# Patient Record
Sex: Female | Born: 1970 | Race: Black or African American | Hispanic: No | Marital: Single | Smoking: Current every day smoker
Health system: Southern US, Community
[De-identification: ages and names within clinical notes are randomized; demographics above are authoritative.]

## PROBLEM LIST (undated history)

## (undated) DIAGNOSIS — I701 Atherosclerosis of renal artery: Secondary | ICD-10-CM

## (undated) DIAGNOSIS — Z8042 Family history of malignant neoplasm of prostate: Secondary | ICD-10-CM

## (undated) DIAGNOSIS — C50919 Malignant neoplasm of unspecified site of unspecified female breast: Secondary | ICD-10-CM

## (undated) DIAGNOSIS — I10 Essential (primary) hypertension: Secondary | ICD-10-CM

## (undated) DIAGNOSIS — C8591 Non-Hodgkin lymphoma, unspecified, lymph nodes of head, face, and neck: Secondary | ICD-10-CM

## (undated) DIAGNOSIS — K219 Gastro-esophageal reflux disease without esophagitis: Secondary | ICD-10-CM

## (undated) DIAGNOSIS — I517 Cardiomegaly: Secondary | ICD-10-CM

## (undated) DIAGNOSIS — I2510 Atherosclerotic heart disease of native coronary artery without angina pectoris: Secondary | ICD-10-CM

## (undated) DIAGNOSIS — G510 Bell's palsy: Secondary | ICD-10-CM

## (undated) DIAGNOSIS — Z803 Family history of malignant neoplasm of breast: Secondary | ICD-10-CM

## (undated) DIAGNOSIS — I219 Acute myocardial infarction, unspecified: Secondary | ICD-10-CM

## (undated) DIAGNOSIS — Z1379 Encounter for other screening for genetic and chromosomal anomalies: Secondary | ICD-10-CM

## (undated) HISTORY — DX: Family history of malignant neoplasm of breast: Z80.3

## (undated) HISTORY — PX: BREAST SURGERY: SHX581

## (undated) HISTORY — PX: TUBAL LIGATION: SHX77

## (undated) HISTORY — DX: Bell's palsy: G51.0

## (undated) HISTORY — DX: Family history of malignant neoplasm of prostate: Z80.42

## (undated) NOTE — *Deleted (*Deleted)
Patient Care Team: Gilda Crease, MD as PCP - General (Internal Medicine) Lennette Bihari, MD as PCP - Cardiology (Cardiology) Harriette Bouillon, MD as Consulting Physician (General Surgery) Serena Croissant, MD as Consulting Physician (Hematology and Oncology) Dorothy Puffer, MD as Consulting Physician (Radiation Oncology) Axel Filler Larna Daughters, NP as Nurse Practitioner (Hematology and Oncology)  DIAGNOSIS: No diagnosis found.  SUMMARY OF ONCOLOGIC HISTORY: Oncology History  Malignant neoplasm of overlapping sites of right breast in female, estrogen receptor positive (HCC)  07/18/2016 Initial Diagnosis   Palpable right breast masses for 1 year; 2 adjacent spiculated masses by ultrasound at 1:00 subareolar 4.2 cm mass; 3 satellite nodules at 9:00; 2 abnormal lymph nodes; biopsy of the 2 masses and the lymph node showed similar-appearing grade 3 IDC ER 100% PR 95% Ki-67 20-30%, HER-2 negative ratio 1.57; T2 N1 stage IIB (AJCC 8)   08/09/2016 - 10/11/2016 Neo-Adjuvant Chemotherapy   Taxotere and Cytoxan every 3 weeks 4 cycles   08/22/2016 Genetic Testing   BRCA1 c.1802A>G VUS identified on the common hereditary cancer panel.  The Hereditary Gene Panel offered by [facility] includes sequencing and/or deletion duplication testing of the following 46 genes: APC, ATM, AXIN2, BARD1, BMPR1A, BRCA1, BRCA2, BRIP1, CDH1, CDKN2A (p14ARF), CDKN2A (p16INK4a), CHEK2, CTNNA1, DICER1, EPCAM (Deletion/duplication testing only), GREM1 (promoter region deletion/duplication testing only), KIT, MEN1, MLH1, MSH2, MSH3, MSH6, MUTYH, NBN, NF1, NHTL1, PALB2, PDGFRA, PMS2, POLD1, POLE, PTEN, RAD50, RAD51C, RAD51D, SDHB, SDHC, SDHD, SMAD4, SMARCA4. STK11, TP53, TSC1, TSC2, and VHL.  The following genes were evaluated for sequence changes only: SDHA and HOXB13 c.251G>A variant only.  The report date is Aug 22, 2016.    01/30/2017 Surgery   Right mastectomy: IDC grade 3, 7.2 cm, high-grade DCIS, lymphovascular invasion  present including dermal lymphatics, perineural invasion present, margins negative, 4/5 lymph nodes positive with extracapsular extension, ER 100%, PR 100%, HER-2 negative ratio 1.41   05/29/2017 - 07/18/2017 Radiation Therapy   Adjuvant radiation therapy   04/13/2018 -  Anti-estrogen oral therapy   Initially prescribed tamoxifen but apparently she did not pick it up.  Because she is menopausal we are starting anastrozole 1 mg daily starting 04/13/2018     CHIEF COMPLIANT: Follow-up of breast cancer on anastrozole   INTERVAL HISTORY: [name] is a 73 y.o. with above-mentioned history of breast cancer who underwent neoadjuvant chemotherapy, mastectomy, radiation, and is currently on antiestrogen therapy with anastrozole. Mammogram on 05/12/18 showed no evidence of malignancy in the left breast. She presents to the clinic today for follow-up.   ALLERGIES:  is allergic to compazine [prochlorperazine] and ondansetron hcl.  MEDICATIONS:  Current Outpatient Medications  Medication Sig Dispense Refill  . acetaminophen (TYLENOL) 500 MG tablet Take 1,000 mg by mouth every 8 (eight) hours as needed for mild pain or headache.    . albuterol (PROAIR HFA) 108 (90 Base) MCG/ACT inhaler Inhale 2 puffs into the lungs every 6 (six) hours as needed for wheezing or shortness of breath.    Marland Kitchen albuterol (PROVENTIL) (2.5 MG/3ML) 0.083% nebulizer solution Take 3 mLs (2.5 mg total) by nebulization every 6 (six) hours as needed for wheezing or shortness of breath. 75 mL 12  . anastrozole (ARIMIDEX) 1 MG tablet TAKE 1 TABLET BY MOUTH EVERY DAY 90 tablet 0  . aspirin 81 MG chewable tablet Chew 81 mg by mouth daily.    Marland Kitchen atorvastatin (LIPITOR) 80 MG tablet Take 1 tablet (80 mg total) by mouth daily at 6 PM. 30 tablet 0  .  cetirizine (ZYRTEC) 10 MG tablet Take 10 mg by mouth daily as needed for allergies.   11  . [facility] D3 5000 units capsule Take 5,000 Units by mouth daily.  11  . furosemide (LASIX) 40 MG tablet Take  1 tablet (40 mg total) by mouth daily. 90 tablet 3  . metoprolol succinate (TOPROL XL) 100 MG 24 hr tablet Take 100 mg by mouth daily.    . mometasone-formoterol (DULERA) 200-5 MCG/ACT AERO Inhale 2 puffs into the lungs 2 (two) times daily. 13 g 0  . nitroGLYCERIN (NITROSTAT) 0.4 MG SL tablet Place 0.4 mg under the tongue every 5 (five) minutes as needed for chest pain.    . sacubitril-valsartan (ENTRESTO) 49-51 MG Take 1 tablet by mouth 2 (two) times daily. 60 tablet 1  . spironolactone (ALDACTONE) 25 MG tablet Take 1 tablet (25 mg total) by mouth daily. 90 tablet 3  . venlafaxine XR (EFFEXOR-XR) 75 MG 24 hr capsule TAKE 1 CAPSULE (75 MG TOTAL) BY MOUTH DAILY WITH BREAKFAST. 30 capsule 11   No current facility-administered medications for this visit.    PHYSICAL EXAMINATION: ECOG PERFORMANCE STATUS: {CHL ONC ECOG [phone]}  There were no vitals filed for this visit. There were no vitals filed for this visit.  BREAST:*** No palpable masses or nodules in either right or left breasts. No palpable axillary supraclavicular or infraclavicular adenopathy no breast tenderness or nipple discharge. (exam performed in the presence of a chaperone)  LABORATORY DATA:  I have reviewed the data as listed CMP Latest Ref Rng & Units 07/02/2019 04/16/2019 04/15/2019  Glucose 65 - 99 mg/dL 91 95 -  BUN 6 - 24 mg/dL 7 17 -  Creatinine 5.18 - 1.00 mg/dL 8.41 6.60 -  Sodium 630 - 144 mmol/L 141 137 140  Potassium 3.5 - 5.2 mmol/L 4.1 3.6 3.3(L)  Chloride 96 - 106 mmol/L 106 102 -  CO2 20 - 29 mmol/L 21 23 -  Calcium 8.7 - 10.2 mg/dL 10.7(H) 9.8 -  Total Protein 6.0 - 8.5 g/dL 6.6 - -  Total Bilirubin 0.0 - 1.2 mg/dL 0.2 - -  Alkaline Phos 39 - 117 IU/L 212(H) - -  AST 0 - 40 IU/L 18 - -  ALT 0 - 32 IU/L 13 - -    Lab Results  Component Value Date   WBC 8.8 07/02/2019   HGB 13.9 07/02/2019   HCT 40.6 07/02/2019   MCV 90 07/02/2019   PLT 230 07/02/2019   NEUTROABS 23.9 (H) 04/11/2019     ASSESSMENT & PLAN:  No problem-specific Assessment & Plan notes found for this encounter.    No orders of the defined types were placed in this encounter.  The patient has a good understanding of the overall plan. she agrees with it. she will call with any problems that may develop before the next visit here.  Total time spent: *** mins including face to face time and time spent for planning, charting and coordination of care  Serena Croissant, MD 01/12/2020  I, Kirt Boys Dorshimer, am acting as scribe for Dr. Serena Croissant.  {insert scribe attestation}

---

## 1998-03-25 HISTORY — PX: SKIN BIOPSY: SHX1

## 2008-12-06 ENCOUNTER — Observation Stay (HOSPITAL_COMMUNITY): Admission: EM | Admit: 2008-12-06 | Discharge: 2008-12-06 | Payer: Self-pay | Admitting: Emergency Medicine

## 2009-02-15 ENCOUNTER — Observation Stay (HOSPITAL_COMMUNITY): Admission: EM | Admit: 2009-02-15 | Discharge: 2009-02-15 | Payer: Self-pay | Admitting: Unknown Physician Specialty

## 2009-03-07 ENCOUNTER — Emergency Department (HOSPITAL_COMMUNITY): Admission: EM | Admit: 2009-03-07 | Discharge: 2009-03-07 | Payer: Self-pay | Admitting: Emergency Medicine

## 2009-05-02 ENCOUNTER — Observation Stay (HOSPITAL_COMMUNITY): Admission: EM | Admit: 2009-05-02 | Discharge: 2009-05-02 | Payer: Self-pay | Admitting: Emergency Medicine

## 2009-05-31 ENCOUNTER — Emergency Department (HOSPITAL_COMMUNITY): Admission: EM | Admit: 2009-05-31 | Discharge: 2009-05-31 | Payer: Self-pay | Admitting: Emergency Medicine

## 2010-06-27 LAB — CBC
MCHC: 33.8 g/dL (ref 30.0–36.0)
MCV: 90.8 fL (ref 78.0–100.0)
RBC: 4.66 MIL/uL (ref 3.87–5.11)
RDW: 14.9 % (ref 11.5–15.5)
WBC: 11.5 10*3/uL — ABNORMAL HIGH (ref 4.0–10.5)

## 2010-06-27 LAB — BASIC METABOLIC PANEL
CO2: 22 mEq/L (ref 19–32)
GFR calc Af Amer: >60 mL/min (ref >60)
GFR calc non Af Amer: >60 mL/min (ref >60)
Glucose, Bld: 132 mg/dL — ABNORMAL HIGH (ref 70–99)
Sodium: 139 mEq/L (ref 135–145)

## 2010-06-27 LAB — DIFFERENTIAL
Basophils Absolute: 0 10*3/uL (ref 0.0–0.1)
Lymphocytes Relative: 11 % — ABNORMAL LOW (ref 12–46)
Monocytes Absolute: 0.2 10*3/uL (ref 0.1–1.0)
Monocytes Relative: 2 % — ABNORMAL LOW (ref 3–12)
Neutro Abs: 9.9 10*3/uL — ABNORMAL HIGH (ref 1.7–7.7)

## 2010-06-29 LAB — URINE MICROSCOPIC-ADD ON

## 2010-06-29 LAB — COMPREHENSIVE METABOLIC PANEL
ALT: 19 U/L (ref 0–35)
BUN: 3 mg/dL — ABNORMAL LOW (ref 6–23)
Chloride: 104 mEq/L (ref 96–112)
Potassium: 3.1 mEq/L — ABNORMAL LOW (ref 3.5–5.1)
Sodium: 138 mEq/L (ref 135–145)
Total Bilirubin: 0.3 mg/dL (ref 0.3–1.2)
Total Protein: 7 g/dL (ref 6.0–8.3)

## 2010-06-29 LAB — CBC
HCT: 43.4 % (ref 36.0–46.0)
Hemoglobin: 14.6 g/dL (ref 12.0–15.0)
MCHC: 33.7 g/dL (ref 30.0–36.0)
Platelets: 215 10*3/uL (ref 150–400)
RBC: 4.85 MIL/uL (ref 3.87–5.11)
RDW: 14.3 % (ref 11.5–15.5)
WBC: 11.4 10*3/uL — ABNORMAL HIGH (ref 4.0–10.5)

## 2010-06-29 LAB — URINALYSIS, ROUTINE W REFLEX MICROSCOPIC
Glucose, UA: NEGATIVE mg/dL
Hgb urine dipstick: NEGATIVE
Ketones, ur: NEGATIVE mg/dL
Specific Gravity, Urine: 1.018 (ref 1.005–1.030)

## 2010-06-29 LAB — DIFFERENTIAL
Basophils Absolute: 0.1 10*3/uL (ref 0.0–0.1)
Basophils Relative: 1 % (ref 0–1)
Lymphocytes Relative: 17 % (ref 12–46)
Lymphs Abs: 1.9 10*3/uL (ref 0.7–4.0)
Monocytes Relative: 5 % (ref 3–12)
Neutro Abs: 8.6 10*3/uL — ABNORMAL HIGH (ref 1.7–7.7)
Neutrophils Relative %: 76 % (ref 43–77)

## 2011-06-21 ENCOUNTER — Encounter (HOSPITAL_COMMUNITY): Payer: Self-pay | Admitting: *Deleted

## 2011-06-21 ENCOUNTER — Emergency Department (HOSPITAL_COMMUNITY)
Admission: EM | Admit: 2011-06-21 | Discharge: 2011-06-21 | Disposition: A | Discharge status: Home / Self Care | Payer: Medicaid Other | Attending: Emergency Medicine | Admitting: Emergency Medicine

## 2011-06-21 ENCOUNTER — Emergency Department (HOSPITAL_COMMUNITY): Payer: Medicaid Other

## 2011-06-21 DIAGNOSIS — J45901 Unspecified asthma with (acute) exacerbation: Secondary | ICD-10-CM

## 2011-06-21 DIAGNOSIS — R0602 Shortness of breath: Secondary | ICD-10-CM | POA: Insufficient documentation

## 2011-06-21 DIAGNOSIS — I10 Essential (primary) hypertension: Secondary | ICD-10-CM | POA: Insufficient documentation

## 2011-06-21 HISTORY — DX: Essential (primary) hypertension: I10

## 2011-06-21 LAB — CBC
MCHC: 33.3 g/dL (ref 30.0–36.0)
MCV: 89.1 fL (ref 78.0–100.0)
Platelets: 232 10*3/uL (ref 150–400)
RDW: 14.1 % (ref 11.5–15.5)
WBC: 9.9 10*3/uL (ref 4.0–10.5)

## 2011-06-21 LAB — DIFFERENTIAL
Basophils Absolute: 0.1 10*3/uL (ref 0.0–0.1)
Basophils Relative: 1 % (ref 0–1)
Eosinophils Absolute: 0.4 10*3/uL (ref 0.0–0.7)
Eosinophils Relative: 4 % (ref 0–5)
Lymphocytes Relative: 48 % — ABNORMAL HIGH (ref 12–46)

## 2011-06-21 LAB — BASIC METABOLIC PANEL
CO2: 23 mEq/L (ref 19–32)
Calcium: 9.7 mg/dL (ref 8.4–10.5)
Creatinine, Ser: 0.65 mg/dL (ref 0.50–1.10)
GFR calc Af Amer: >90 mL/min (ref >90)
GFR calc non Af Amer: >90 mL/min (ref >90)
Sodium: 139 mEq/L (ref 135–145)

## 2011-06-21 MED ORDER — METHYLPREDNISOLONE SODIUM SUCC 125 MG IJ SOLR
125.0000 mg | Freq: Once | INTRAMUSCULAR | Status: AC
  Administered 2011-06-21: 125 mg via INTRAVENOUS
  Filled 2011-06-21: qty 2

## 2011-06-21 MED ORDER — ALBUTEROL (5 MG/ML) CONTINUOUS INHALATION SOLN
10.0000 mg/h | INHALATION_SOLUTION | RESPIRATORY_TRACT | Status: DC
  Administered 2011-06-21: 10 mg/h via RESPIRATORY_TRACT

## 2011-06-21 MED ORDER — ALBUTEROL SULFATE (5 MG/ML) 0.5% IN NEBU
INHALATION_SOLUTION | RESPIRATORY_TRACT | Status: AC
  Administered 2011-06-21: 5 mg via RESPIRATORY_TRACT
  Filled 2011-06-21: qty 1

## 2011-06-21 MED ORDER — ALBUTEROL SULFATE (5 MG/ML) 0.5% IN NEBU: 2.5000 mg | INHALATION_SOLUTION | Freq: Once | RESPIRATORY_TRACT | Status: DC

## 2011-06-21 MED ORDER — ALBUTEROL SULFATE (5 MG/ML) 0.5% IN NEBU
5.0000 mg | INHALATION_SOLUTION | Freq: Once | RESPIRATORY_TRACT | Status: AC
  Administered 2011-06-21: 5 mg via RESPIRATORY_TRACT

## 2011-06-21 MED ORDER — IPRATROPIUM BROMIDE 0.02 % IN SOLN
RESPIRATORY_TRACT | Status: AC
  Administered 2011-06-21: 0.5 mg via RESPIRATORY_TRACT
  Filled 2011-06-21: qty 2.5

## 2011-06-21 MED ORDER — PREDNISONE 10 MG PO TABS: 20.0000 mg | ORAL_TABLET | Freq: Every day | ORAL | Status: DC

## 2011-06-21 MED ORDER — IPRATROPIUM BROMIDE 0.02 % IN SOLN
0.5000 mg | Freq: Once | RESPIRATORY_TRACT | Status: AC
  Administered 2011-06-21: 0.5 mg via RESPIRATORY_TRACT

## 2011-06-21 NOTE — ED Provider Notes (Signed)
Medical screening examination/treatment/procedure(s) were performed by non-physician practitioner and as supervising physician I was immediately available for consultation/collaboration.  Arshiya Jakes M Alyzae Hawkey, MD 06/21/11 2252 

## 2011-06-21 NOTE — ED Notes (Signed)
Pt d/c home in NAD. Pt no longer SOB and states "I feel much better." Pt voiced understanding of d/c instructions, prescriptions and follow up care.

## 2011-06-21 NOTE — Progress Notes (Signed)
Patient is no longer wheezing and WOB has improved. Did peak flow and FEV1.  FEV 1 was 1.5, Peak flow was 180.  This is 50% of predicted for her age and height.  Spoke with MD.

## 2011-06-21 NOTE — Progress Notes (Signed)
Patient is unable to do peak flow at this time.  She can not speak in complete sentences.  Asked MD what he thought about doing a continuous nebulizer and then trying to assess peak flows.  MD approved.

## 2011-06-21 NOTE — ED Provider Notes (Signed)
History     CSN: [id number]  Arrival date & time 06/21/11  1225   None     Chief Complaint  Patient presents with  . Shortness of Breath    (Consider location/radiation/quality/duration/timing/severity/associated sxs/prior treatment) Patient is a 41 y.o. female presenting with shortness of breath. The history is provided by the patient. No language interpreter was used.  Shortness of Breath  The current episode started yesterday. The problem occurs frequently. The problem has been gradually worsening. The problem is moderate. Associated symptoms include cough, shortness of breath and wheezing. Pertinent negatives include no fever. She was not exposed to toxic fumes. She has not inhaled smoke recently. She has had no prior hospitalizations. She has had no prior ICU admissions. She has had no prior intubations. Her past medical history is significant for asthma.  Patient states this episode of wheezing started yesterday.  Initially improved with albuterol MDI, but symptoms continue to worsen.  Denies fever, cough.    Past Medical History  Diagnosis Date  . Asthma   . Hypertension     Past Surgical History  Procedure Date  . Cesarean section     No family history on file.  History  Substance Use Topics  . Smoking status: Current Everyday Smoker  . Smokeless tobacco: Not on file  . Alcohol Use: No    OB History    Grav Para Term Preterm Abortions TAB SAB Ect Mult Living                  Review of Systems  Constitutional: Negative for fever.  Respiratory: Positive for cough, shortness of breath and wheezing.   All other systems reviewed and are negative.    Allergies  Review of patient's allergies indicates no known allergies.  Home Medications   Current Outpatient Rx  Name Route Sig Dispense Refill  . ALBUTEROL SULFATE HFA 108 (90 BASE) MCG/ACT IN AERS Inhalation Inhale 2 puffs into the lungs every 4 (four) hours as needed. As needed for shortness of  breath/asthma.    Marland Kitchen LISINOPRIL-HYDROCHLOROTHIAZIDE 20-12.5 MG PO TABS Oral Take 1 tablet by mouth daily.    Marland Kitchen MONTELUKAST SODIUM 10 MG PO TABS Oral Take 10 mg by mouth at bedtime.      BP 156/107  Pulse 94  Temp 98.1 F (36.7 C)  Resp 28  Ht 5\' 3"  (1.6 m)  Wt 182 lb (82.555 kg)  BMI 32.24 kg/m2  SpO2 100%  Physical Exam  Nursing note and vitals reviewed. Constitutional: She is oriented to person, place, and time. She appears well-developed and well-nourished.  HENT:  Head: Normocephalic.  Eyes: Pupils are equal, round, and reactive to light.  Neck: Normal range of motion. Neck supple.  Cardiovascular: Normal rate, regular rhythm, normal heart sounds and intact distal pulses.   Pulmonary/Chest: She is in respiratory distress. She has wheezes. She has no rales. She exhibits tenderness.  Abdominal: Soft.  Musculoskeletal: Normal range of motion.  Neurological: She is alert and oriented to person, place, and time.  Skin: Skin is warm and dry.  Psychiatric: She has a normal mood and affect. Her behavior is normal. Judgment and thought content normal.    ED Course  Procedures (including critical care time)  Labs Reviewed - No data to display No results found.   No diagnosis found.  1:01 PM  Asthma exacerbation.  Will initiate nebulized albuterol/atrovent, and obtain chest film to r/o pneumonia.  Symptoms improved with treatment.  Peak flows assessed by RT.  Will continue with short course of prednisone at home, with continuation of established treatment plan.  Patient to follow-up with her PCP.  MDM          Jimmye Norman, NP 06/21/11 2248

## 2011-06-21 NOTE — ED Notes (Signed)
Pt receiving a neb treatment at this time.

## 2011-06-21 NOTE — Discharge Instructions (Signed)

## 2011-06-21 NOTE — ED Notes (Signed)
Patient with hx of asthma,  She noted increased sob last night.  Patient has tried her home tx w/o relief. Her last inhaler treatment was pta

## 2014-06-20 ENCOUNTER — Ambulatory Visit (HOSPITAL_BASED_OUTPATIENT_CLINIC_OR_DEPARTMENT_OTHER): Payer: Medicaid Other

## 2014-08-16 ENCOUNTER — Ambulatory Visit (INDEPENDENT_AMBULATORY_CARE_PROVIDER_SITE_OTHER): Payer: Medicaid Other | Admitting: Neurology

## 2014-08-16 ENCOUNTER — Encounter: Payer: Self-pay | Admitting: Neurology

## 2014-08-16 VITALS — BP 117/77 | HR 67 | Temp 98.0°F | Ht 63.0 in | Wt 156.5 lb

## 2014-08-16 DIAGNOSIS — R0681 Apnea, not elsewhere classified: Secondary | ICD-10-CM

## 2014-08-16 DIAGNOSIS — R51 Headache: Secondary | ICD-10-CM | POA: Diagnosis not present

## 2014-08-16 DIAGNOSIS — H905 Unspecified sensorineural hearing loss: Secondary | ICD-10-CM

## 2014-08-16 DIAGNOSIS — R519 Headache, unspecified: Secondary | ICD-10-CM | POA: Insufficient documentation

## 2014-08-16 DIAGNOSIS — H538 Other visual disturbances: Secondary | ICD-10-CM | POA: Diagnosis not present

## 2014-08-16 DIAGNOSIS — H9190 Unspecified hearing loss, unspecified ear: Secondary | ICD-10-CM

## 2014-08-16 DIAGNOSIS — R0683 Snoring: Secondary | ICD-10-CM

## 2014-08-16 DIAGNOSIS — G4719 Other hypersomnia: Secondary | ICD-10-CM | POA: Diagnosis not present

## 2014-08-16 MED ORDER — CYCLOBENZAPRINE HCL 5 MG PO TABS: 5.0000 mg | ORAL_TABLET | Freq: Three times a day (TID) | ORAL | Status: DC | PRN

## 2014-08-16 MED ORDER — NORTRIPTYLINE HCL 10 MG PO CAPS: 10.0000 mg | ORAL_CAPSULE | Freq: Every day | ORAL | Status: DC

## 2014-08-16 NOTE — Patient Instructions (Addendum)
Overall you are doing fairly well but I do want to suggest a few things today:   Remember to drink plenty of fluid, eat healthy meals and do not skip any meals. Try to eat protein with a every meal and eat a healthy snack such as fruit or nuts in between meals. Try to keep a regular sleep-wake schedule and try to exercise daily, particularly in the form of walking, 20-30 minutes a day, if you can.   As far as your medications are concerned, I would like to suggest: Nortriptyline at night, sleep study  As far as diagnostic testing: MRi of the brain, lab test  I would like to see you back in 6 months, sooner if we need to. Please call us with any interim questions, concerns, problems, updates or refill requests.   Please also call us for any test results so we can go over those with you on the phone.  My clinical assistant and will answer any of your questions and relay your messages to me and also relay most of my messages to you.   Our phone number is [phone]. We also have an after hours call service for urgent matters and there is a physician on-call for urgent questions. For any emergencies you know to call 911 or go to the nearest emergency room

## 2014-08-16 NOTE — Progress Notes (Signed)
[facility]    Provider:  Dr Jaynee Eagles Referring Provider: Rickey Barbara* Primary Care Physician:  Pcp Not In System  CC:  Headache  HPI:  [name] is a 44 y.o. female here as a referral from Dr. 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 Has had the symptoms for years, has residual right-sided weakness. No worsening of her right-sided Bell's palsy symptoms, they are stable. She is having pain on the left side of the face now.  After she sleeps, she can't open up her left eye, she has to use her fingers to pry her left eye open. No crusting or exudate on the eyelids or any injection or itching. During the day she can close it and open her left eye just fine. Symptoms started a month ago. She has twitches 2-3 times a day in the left eye but twitching is not the problem keeping it closed. No weakness or numbness on the left side of the face. She does endorse pain on the left side of her face, she has to take a warm cloth and place it on the left side of her face. The pain gets so bad that she jumps up. She has been taking over-the-counter medications every day, 6 times a day. No light sensitivity or sound sensitivity. No nausea. She has blurry vision. She hears swishing in her ears. She is not wearing her glasses today but recently did get some. She describes the left-sided as a dull headache, worse at night when she is sleeping. The headache wakes her up. She snores. Her boyfriend says she stops breathing at night. She was supposed to have a sleep test but her car broke down and she never showed up for her testing. She has headaches 3 times a day for several hours each.   Review of Systems: Patient complains of symptoms per HPI as well as the following symptoms: Fatigue, chest pain, swelling and legs, blurred vision, shortness of breath, cough, wheezing, feeling cold, feeling hot, increased thirst,  confusion, weakness, allergies, too much sleep. Pertinent negatives per HPI. All others negative.   History   Social History  . Marital Status: Single    Spouse Name: N/A  . Number of Children: 4  . Years of Education: 9   Occupational History  . Unemployed    Social History Main Topics  . Smoking status: Current Every Day Smoker  . Smokeless tobacco: Not on file     Comment: 6 cig per day  . Alcohol Use: No  . Drug Use: No  . Sexual Activity: Not on file   Other Topics Concern  . Not on file   Social History Narrative   Lives at home with fiance and kids   Caffeine use: 2 cups coffee per day   4 Dr. Malachi Bonds (12oz) per day     History reviewed. No pertinent family history.  Past Medical History  Diagnosis Date  . Asthma   . Hypertension     Past Surgical History  Procedure Laterality Date  . Cesarean section  [place]    x2  . Skin biopsy  2000    Current Outpatient Prescriptions  Medication Sig Dispense Refill  . albuterol (PROVENTIL HFA;VENTOLIN HFA) 108 (90 BASE) MCG/ACT inhaler Inhale 2 puffs into the lungs every 4 (four) hours as needed. As needed for shortness of breath/asthma.    Marland Kitchen amLODipine (NORVASC) 10 MG tablet Take 10 mg by  mouth daily.    Marland Kitchen aspirin 81 MG chewable tablet Chew 81 mg by mouth daily.    . cloNIDine (CATAPRES) 0.2 MG tablet Take 0.2 mg by mouth 2 (two) times daily.    . hydrochlorothiazide (HYDRODIURIL) 25 MG tablet Take 25 mg by mouth daily.    Marland Kitchen ibuprofen (ADVIL,MOTRIN) 800 MG tablet Take 800 mg by mouth as needed for mild pain or moderate pain.   1  . lisinopril (PRINIVIL,ZESTRIL) 40 MG tablet Take 40 mg by mouth daily.    . metoprolol succinate (TOPROL-XL) 50 MG 24 hr tablet Take 50 mg by mouth daily.  3  . mometasone-formoterol (DULERA) 200-5 MCG/ACT AERO Inhale 1 puff into the lungs 2 (two) times daily.    . montelukast (SINGULAIR) 10 MG tablet Take 10 mg by mouth at bedtime.    . nitroGLYCERIN (NITROSTAT) 0.4 MG SL tablet  Place 0.4 mg under the tongue every 5 (five) minutes as needed for chest pain.    Marland Kitchen omeprazole (PRILOSEC) 40 MG capsule Take 40 mg by mouth daily.    . cyclobenzaprine (FLEXERIL) 5 MG tablet Take 1 tablet (5 mg total) by mouth every 8 (eight) hours as needed for muscle spasms. 60 tablet 6  . nortriptyline (PAMELOR) 10 MG capsule Take 1 capsule (10 mg total) by mouth at bedtime. 30 capsule 6   No current facility-administered medications for this visit.    Allergies as of 08/16/2014  . (No Known Allergies)    Vitals: BP 117/77 mmHg  Pulse 67  Temp(Src) 98 F (36.7 C)  Ht 5\' 3"  (1.6 m)  Wt 156 lb 8 oz (70.988 kg)  BMI 27.73 kg/m2 Last Weight:  Wt Readings from Last 1 Encounters:  08/16/14 156 lb 8 oz (70.988 kg)   Last Height:   Ht Readings from Last 1 Encounters:  08/16/14 5\' 3"  (1.6 m)    Physical exam: Exam: Gen: NAD, conversant, well nourised, overweight, well groomed                     CV: RRR, no MRG. No Carotid Bruits. No peripheral edema, warm, nontender Eyes: Conjunctivae clear without exudates or hemorrhage  Neuro: Detailed Neurologic Exam  Speech:    Speech is normal; fluent and spontaneous with normal comprehension.  Cognition:    The patient is oriented to person, place, and time;     recent and remote memory intact;     language fluent;     normal attention, concentration,     fund of knowledge Cranial Nerves:    The pupils are equal, round, and reactive to light. The fundi are normal and spontaneous venous pulsations are present. Visual fields are full to finger confrontation. Extraocular movements are intact. Trigeminal sensation is intact and the muscles of mastication are normal. Right lower  facial droop and retracted right lid c/w bells palsy. The palate elevates in the midline. Hearing intact. Voice is normal. Shoulder shrug is normal. The tongue has normal motion without fasciculations.   Coordination:    Normal finger to nose and heel to shin.  Normal rapid alternating movements.   Gait:    Heel-toe and tandem gait are normal.   Motor Observation:    No asymmetry, no atrophy, and no involuntary movements noted. Tone:    Normal muscle tone.    Posture:    Posture is normal. normal erect    Strength:    Strength is V/V in the upper and lower limbs.  Sensation: intact to LT     Reflex Exam:  DTR's:    Deep tendon reflexes in the upper and lower extremities are normal bilaterally.   Toes:    The toes are downgoing bilaterally.   Clonus:    Clonus is absent.     Assessment/Plan:  44 year old female with past medical history of hypertension, wright-sided Bell's palsy now presenting with left sided headache. Will order MRI of the brain due to blurry vision and hearing changes. Neuro exam is nonfocal.   Headaches may also be partly due to obstructive sleep apnea as her boyfriend states that she snores and has witnessed apneic events at night, with headaches that wake her up from sleep.    Will order sleep study: She has a headache, worse at night when she is sleeping. The pain wakes her up. She snores. Her boyfriend says she stops breathing at night. She was supposed to have a sleep test but her car broke down.   FSS 42, ESS 9  Will start nortriptyline at night for headache. Personally reviewed EKG and QTC normal  Flexeril when necessary for headache  CMP    Sarina Ill, MD  [facility] [address] [place], [place] [address]  Phone [phone] Fax [phone]

## 2014-08-17 ENCOUNTER — Telehealth: Payer: Self-pay

## 2014-08-17 LAB — COMPREHENSIVE METABOLIC PANEL WITH GFR
ALT: 9 [IU]/L (ref 0–32)
AST: 12 [IU]/L (ref 0–40)
Albumin/Globulin Ratio: 1.8 (ref 1.1–2.5)
Albumin: 4.8 g/dL (ref 3.5–5.5)
Alkaline Phosphatase: 112 [IU]/L (ref 39–117)
BUN/Creatinine Ratio: 10 (ref 9–23)
BUN: 8 mg/dL (ref 6–24)
Bilirubin Total: 0.2 mg/dL (ref 0.0–1.2)
CO2: 23 mmol/L (ref 18–29)
Calcium: 10 mg/dL (ref 8.7–10.2)
Chloride: 101 mmol/L (ref 97–108)
Creatinine, Ser: 0.83 mg/dL (ref 0.57–1.00)
GFR calc Af Amer: 101 mL/min/{1.73_m2}
GFR calc non Af Amer: 87 mL/min/{1.73_m2}
Globulin, Total: 2.6 g/dL (ref 1.5–4.5)
Glucose: 96 mg/dL (ref 65–99)
Potassium: 3.4 mmol/L — ABNORMAL LOW (ref 3.5–5.2)
Sodium: 145 mmol/L — ABNORMAL HIGH (ref 134–144)
Total Protein: 7.4 g/dL (ref 6.0–8.5)

## 2014-08-17 NOTE — Telephone Encounter (Signed)
Patient informed her labs were normal

## 2014-08-21 ENCOUNTER — Encounter: Payer: Self-pay | Admitting: Neurology

## 2014-08-22 DIAGNOSIS — I150 Renovascular hypertension: Secondary | ICD-10-CM

## 2014-08-22 NOTE — H&P (Signed)
OFFICE VISIT NOTES COPIED TO EPIC FOR DOCUMENTATION   [name] 08/23/2014 8:43 AM Location: [facility] Patient #: [phone] DOB: August 24, 1970 Single / Language: Cleophus Molt / Race: Black or African American Female    History of Present Illness(Chandra Joannie Springs, MD; 08-23-2014 3:27 PM) The patient is a 44 year old female who presents for a follow-up for Hypertension. Ms. Arrey is 44 years old African-American female. She has history of hypertension since 2004. Patient had uncontrolled hypertension, She still has mildly high systolic pressure, but, overall, much better controlled.  She does not have any substernal chest pain now but patient has felt pain below the left breast on lifting weight or sometimes on walking. There is no radiation to the arm, neck or back of the chest and no associated dyspnea or diaphoresis. Patient does complain of pain in the left arm at other times. She has pain in the cervical spine which radiates to the left arm and her index and middle finger.  She also feels shortness of breath on exertion. No history of dyspnea at rest and no orthopnea or PND. She has occasional mild swelling on the legs. No history of leg claudication.  No complaints of palpitation. She has dizziness off and on which is not related to posture and there is no associated palpitation. Dizziness usually lasts for a few minutes. No history of near-syncope or syncope.  No history of diabetes or hypercholesterolemia. She has smoked since age 65 years and is still smoking 6 cigarettes per day.  No history of kidney problems at any time. No history of thyroid problems. No history of TIA or CVA. 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 No history of recurrence.     Problem List/Past Medical(April Louretta Shorten; 2014-08-23 1:50 PM) Asthma (J45.909) BMI 28.0-28.9,adult (Z68.28) Uncontrolled hypertension (I10) Chest pain, exertional (R07.9).  Nuclear stress test. 06/27/2014 1. The resting electrocardiogram demonstrated normal sinus rhythm, normal resting conduction, no resting arrhythmias and normal rest repolarization. Poor R progression. Stress EKG is non-diagnostic for ischemia as it a pharmacologic stress using Lexiscan. Stress symptoms included dyspnea, dizziness. 2. Left ventricular cavity is noted to be enlarged on the rest and stress studies. The LV end diastolic volume was 657QI. SPECT images demonstrate homogeneous tracer distribution throughout the myocardium. The left ventricular ejection fraction was calculated or visually estimated to be 39%. There is no e/o ischemia or scar. This represents an intermediate risk study. Benign essential hypertension (I10) History of lymphoma (Z85.79). 2002 Pt had chemo treatment for 1 year    Allergies(April Louretta Shorten; 23-Aug-2014 1:50 PM) No Known Drug Allergies. 06/22/2014    Family History(April Garrison; 2014/08/23 1:50 PM) Mother. Deceased. at age 34, from Cancer. Hx of HTN Sister 2. 1 older 1 younger Brother 2. younger Father. Deceased. at age 15, from Heart Conditions. Hx of Mech Valve placement in 2001, Hx of HTN    Social History(April Garrison; 08/23/2014 1:50 PM) Living Situation. Lives with relatives. Number of Children. 4. Marital status. Single. Non Drinker/No Alcohol Use Current tobacco use. Current every day smoker. 6 cigarettes per day, smoker since age of 68    Past Surgical History(April Louretta Shorten; 2014/08/23 1:50 PM) Cesarean Delivery. 1991 1989 Biopsy in right side of neck (Lymphoma). 2002 Pt had chemotherapy for 1 year.    Medication History(April Louretta Shorten; Aug 23, 2014 1:55 PM) Nitrostat (0.4MG  Tab Sublingual, 1 (one) Tab Sublingual Sublingual every 5 minutes as needed for chest pain., Taken starting 06/22/2014) Active. Lisinopril (40MG  Tablet, 1 (one)  Tablet Oral daily, Taken starting 06/22/2014) Active. AmLODIPine Besylate  (10MG  Tablet, 1 (one) Tablet Oral daily, Taken starting 06/22/2014) Active. Hydrochlorothiazide (25MG  Tablet, 1 (one) Tablet Oral daily, Taken starting 07/06/2014) Active. CloNIDine HCl (0.2MG  Tablet, 1 Oral two times daily, Taken starting 07/19/2014) Active. Tylenol Extra Strength (500MG  Tablet, 2 Oral three times daily as needed) Active. ProAir HFA (108 (90 Base)MCG/ACT Aerosol Soln, 2 puffs Inhalation three times daily) Active. Dulera (200-5MCG/ACT Aerosol, 1 puff Inhalation two times daily) Active. Montelukast Sodium (10MG  Tablet, 1 Oral at bedtime) Active. Omeprazole (40MG  Capsule DR, 1 Oral daily) Active. Metoprolol Succinate ER (50MG  Tablet ER 24HR, 1 Oral daily) Active. Medications Reconciled. Aspirin Childrens (81MG  Tablet Chewable, 1 Oral daily) Active. Ibuprofen (800MG  Tablet, 1 Oral as needed) Active.    Diagnostic Studies History(April Garrison; 08/09/2014 1:50 PM) Renal Dopplers. 07/14/2014 Hemodynamically significant stenosis of the left renal artery. Both kidneys are normal in size. Normal intrarenal vascular perfusion is noted in both kidneys. Clinical correlation is suggested. Echocardiogram. 07/19/2014 1. Left ventricle cavity is borderline dilated. Mild concentric hypertrophy of the left ventricle. Mild decrease in global wall motion. Normal diastolic filling pattern. Left ventricle regional wall motion findings: No regional wall motion abnormalities. Visual EF is 45-50%. Calculated EF 51%. 2. Left atrial cavity is mildly dilated. Right atrial cavity is mildly dilated. 3. Mild to moderate mitral regurgitation. 4. Mild tricuspid regurgitation. Mild pulmonary hypertension. Endoscopy. 2002 Nuclear stress test. 06/27/2014 1. The resting electrocardiogram demonstrated normal sinus rhythm, normal resting conduction, no resting arrhythmias and normal rest repolarization. Poor R progression. Stress EKG is non-diagnostic for ischemia as it a pharmacologic stress using Lexiscan.  Stress symptoms included dyspnea, dizziness. 2. Left ventricular cavity is noted to be enlarged on the rest and stress studies. The LV end diastolic volume was 735HG. SPECT images demonstrate homogeneous tracer distribution throughout the myocardium. The left ventricular ejection fraction was calculated or visually estimated to be 39%. There is no e/o ischemia or scar. This represents an intermediate risk study. Clinical correlation recommended and if suspicision for CAD, consider further w/u.    Review of Systems(Chandra Joannie Springs, MD; 08/09/2014 3:16 PM)   Note: GENERAL- Not feeling tired or fatigue, No fever, chills. No recent weight change. CARDIO VASCULAR- Has chest pain. Has exertional shortness of breath, No orthopnea or PND. No palpitation, Has dizziness, No fainting. Has hypertension. No h/o high cholesterol. Has occ. swelling on legs. No claudication in legs, No cramps. No h/o DVT PULMONARY- No cough, phlegm, wheezing, not feeling congested in chest. GASTROINTESTINAL- No abdominal pain, nausea, vomiting or diarrhea. No dark tarry stools.Normal appetite. No heartburn. ENDOCRINE- No Thyroid problem, No feeling of excessive heat or cold, No polydipsia or polyuria. No Diabetes. NEUROLOGICAL- No focal motor or sensory symptoms, Good coordination. No seizures. MUSCULOSKELETAL- No generalized myalgias or muscle weakness. No joint swelling SKIN- No skin rash, No pruritus HEMATOLOGY- No anemia, petechiae, excessive bruising, epistaxis, GI bleed or any abnormal bleeding.    Vitals(April Garrison; 08/09/2014 1:58 PM) 08/09/2014 1:50 PM Weight: 158.5 lb Height: 63 in Body Surface Area: 1.79 m Body Mass Index: 28.08 kg/m Pulse: 68 (Regular) P.OX: 97% (Room air) BP: 146/84 (Sitting, Left Arm, Standard)     Physical Exam(Chandra Joannie Springs, MD; 08/09/2014 3:22 PM) The physical exam findings are as follows:  Note: GENERAL APPEARANCE- Alert, Oriented. Well built,  nourished HEENT- Unremarkable, fundi were not examined. NECK- No JVD. Carotid pulses are 2+, No bruits audible. No thyromegaly. No lymphadenopathy. HEART- Auscultation- Normal S1, S2. S4 gallop  at apex. Gr. 2/6 mid systolic murmur is audible at apex, conducted to the axilla. CHEST- Normal shape. Normal percussion. Auscultation- Normal breath sounds, No crepitations. No wheezing. ABDOMEN- Palpation- Soft, Nontender. No hepatosplenomegaly. No masses felt. Auscultation- No bruits audible over aorta, or renal arteries EXTREMITIES- No Clubbing or Cyanosis. No edema on legs or feet. PERIPHERAL PULSES- Both femoral pulses- 2+, No bruits audible. Both dorsalis pedis pulses- 2+, Both posterior tibial pulses- 2+ CERVICAL SPINE- There is tenderness on cervical spine and pain on right lateral movement as well as right lateral flexion.    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 Left ventricle cavity is borderline dilated. Mild concentric hypertrophy of the left ventricle. Mild decrease in global wall motion. Normal diastolic filling pattern. Left ventricle regional wall motion findings: No regional wall motion abnormalities. Visual EF is 45-50%. Calculated EF 51%. 2. Left atrial cavity is mildly dilated. Right atrial cavity is mildly dilated. 3. Mild to moderate mitral regurgitation. 4. Mild tricuspid regurgitation. Mild pulmonary hypertension. Future Plans l 9/67/5916: METABOLIC PANEL, BASIC (38466) - one time l 08/15/2014: CBC & PLATELETS (AUTO) (59935) - one time l 08/15/2014: PT (PROTHROMBIN TIME) (70177) - one time  Left renal artery stenosis (I70.1) Story: Renal Dopplers. 07/14/2014 Hemodynamically significant stenosis of the left renal artery. Both kidneys are normal in size. Normal intrarenal vascular perfusion is noted in both kidneys. Clinical correlation is suggested.  Chest  pain, exertional (R07.9) Story: Nuclear stress test. 06/27/2014 1. The resting electrocardiogram demonstrated normal sinus rhythm, normal resting conduction, no resting arrhythmias and normal rest repolarization. Poor R progression. Stress EKG is non-diagnostic for ischemia as it a pharmacologic stress using Lexiscan. Stress symptoms included dyspnea, dizziness. 2. Left ventricular cavity is noted to be enlarged on the rest and stress studies. The LV end diastolic volume was 939QZ. SPECT images demonstrate homogeneous tracer distribution throughout the myocardium. The left ventricular ejection fraction was calculated or visually estimated to be 39%. There is no e/o ischemia or scar. This represents an intermediate risk study.  Note: Results of echocardiogram and renal artery duplex study were explained to the patient. She has significantly stenosis of left renal artery by duplex study. Patient has severe hypertension requiring high dosages of 5 different medications. In spite of that, her systolic pressure is mildly elevated. As such, I have recommended renal arteriogram and possible stent implant if indicated. The indications, procedure, and possible risks of angiogram (including but not limited to- bleeding, infection, allergic reaction to dye, worsening of renal function etc.) have been explained to her. She verbalized understanding.  She was advised to continue all the present medications for hypertension. Compliance of therapy was emphasized.  Primary prevention was again discussed with her. She was advised to follow low-salt, low-cholesterol diet. Patient was again advised to quit smoking completely.  It appears that patient has cervical spondylosis. I have advised her to have follow-up with her PCP.  I will see her in follow-up 2 weeks after the renal artery angiogram. It was a 30 min. face to face visit for E & [facility], explaining condition, therapy, planned procedure etc. to the pt.  CC:  Jerel Shepherd, NP   Signed electronically by Despina Hick, MD (08/09/2014 3:35 PM)

## 2014-08-23 ENCOUNTER — Ambulatory Visit (HOSPITAL_COMMUNITY)
Admission: RE | Admit: 2014-08-23 | Discharge: 2014-08-25 | Disposition: A | Discharge status: Home / Self Care | Payer: Medicaid Other | Source: Ambulatory Visit | Attending: Cardiology | Admitting: Cardiology

## 2014-08-23 ENCOUNTER — Encounter (HOSPITAL_COMMUNITY): Payer: Self-pay | Admitting: General Practice

## 2014-08-23 ENCOUNTER — Encounter (HOSPITAL_COMMUNITY)
Admission: RE | Disposition: A | Discharge status: Home / Self Care | Payer: Medicaid Other | Source: Ambulatory Visit | Attending: Cardiology

## 2014-08-23 DIAGNOSIS — F17210 Nicotine dependence, cigarettes, uncomplicated: Secondary | ICD-10-CM | POA: Diagnosis not present

## 2014-08-23 DIAGNOSIS — Z8579 Personal history of other malignant neoplasms of lymphoid, hematopoietic and related tissues: Secondary | ICD-10-CM | POA: Insufficient documentation

## 2014-08-23 DIAGNOSIS — Z7982 Long term (current) use of aspirin: Secondary | ICD-10-CM | POA: Diagnosis not present

## 2014-08-23 DIAGNOSIS — J45909 Unspecified asthma, uncomplicated: Secondary | ICD-10-CM | POA: Insufficient documentation

## 2014-08-23 DIAGNOSIS — K219 Gastro-esophageal reflux disease without esophagitis: Secondary | ICD-10-CM | POA: Insufficient documentation

## 2014-08-23 DIAGNOSIS — Z9221 Personal history of antineoplastic chemotherapy: Secondary | ICD-10-CM | POA: Diagnosis not present

## 2014-08-23 DIAGNOSIS — I739 Peripheral vascular disease, unspecified: Secondary | ICD-10-CM | POA: Diagnosis not present

## 2014-08-23 DIAGNOSIS — I150 Renovascular hypertension: Secondary | ICD-10-CM | POA: Diagnosis not present

## 2014-08-23 DIAGNOSIS — I701 Atherosclerosis of renal artery: Secondary | ICD-10-CM | POA: Diagnosis not present

## 2014-08-23 DIAGNOSIS — I724 Aneurysm of artery of lower extremity: Secondary | ICD-10-CM | POA: Diagnosis not present

## 2014-08-23 HISTORY — PX: PERIPHERAL VASCULAR CATHETERIZATION: SHX172C

## 2014-08-23 HISTORY — DX: Non-Hodgkin lymphoma, unspecified, lymph nodes of head, face, and neck: C85.91

## 2014-08-23 HISTORY — PX: RENAL ARTERY STENT: SHX2321

## 2014-08-23 HISTORY — DX: Gastro-esophageal reflux disease without esophagitis: K21.9

## 2014-08-23 LAB — HCG, SERUM, QUALITATIVE: Preg, Serum: NEGATIVE

## 2014-08-23 LAB — POCT ACTIVATED CLOTTING TIME
ACTIVATED CLOTTING TIME: 140 s
Activated Clotting Time: 300 seconds
Activated Clotting Time: 202 seconds

## 2014-08-23 SURGERY — RENAL ANGIOGRAPHY
Anesthesia: LOCAL

## 2014-08-23 MED ORDER — HEPARIN SODIUM (PORCINE) 1000 UNIT/ML IJ SOLN
INTRAMUSCULAR | Status: AC
  Filled 2014-08-23: qty 1

## 2014-08-23 MED ORDER — HYDROMORPHONE HCL 1 MG/ML IJ SOLN
INTRAMUSCULAR | Status: AC
  Filled 2014-08-23: qty 1

## 2014-08-23 MED ORDER — SODIUM CHLORIDE 0.9 % IV SOLN: 1.0000 mL/kg/h | INTRAVENOUS | Status: AC

## 2014-08-23 MED ORDER — SODIUM CHLORIDE 0.9 % IV SOLN
INTRAVENOUS | Status: DC
  Administered 2014-08-23: 10:00:00 via INTRAVENOUS

## 2014-08-23 MED ORDER — HEPARIN SODIUM (PORCINE) 1000 UNIT/ML IJ SOLN
INTRAMUSCULAR | Status: DC | PRN
  Administered 2014-08-23: 6000 [IU] via INTRAVENOUS

## 2014-08-23 MED ORDER — LIDOCAINE HCL (PF) 1 % IJ SOLN
INTRAMUSCULAR | Status: AC
  Filled 2014-08-23: qty 30

## 2014-08-23 MED ORDER — MIDAZOLAM HCL 2 MG/2ML IJ SOLN
INTRAMUSCULAR | Status: DC | PRN
  Administered 2014-08-23: 1 mg via INTRAVENOUS

## 2014-08-23 MED ORDER — CLOPIDOGREL BISULFATE 300 MG PO TABS
ORAL_TABLET | ORAL | Status: AC
  Filled 2014-08-23: qty 1

## 2014-08-23 MED ORDER — ANGIOPLASTY BOOK
Freq: Once | Status: AC
  Administered 2014-08-23: 21:00:00
  Filled 2014-08-23: qty 1

## 2014-08-23 MED ORDER — IODIXANOL 320 MG/ML IV SOLN
INTRAVENOUS | Status: DC | PRN
  Administered 2014-08-23: 115 mL via INTRAVENOUS

## 2014-08-23 MED ORDER — NORTRIPTYLINE HCL 10 MG PO CAPS
10.0000 mg | ORAL_CAPSULE | Freq: Every day | ORAL | Status: DC
  Administered 2014-08-23 – 2014-08-24 (×2): 10 mg via ORAL
  Filled 2014-08-23 (×4): qty 1

## 2014-08-23 MED ORDER — ONDANSETRON HCL 4 MG/2ML IJ SOLN: 4.0000 mg | Freq: Four times a day (QID) | INTRAMUSCULAR | Status: DC | PRN

## 2014-08-23 MED ORDER — HYDROMORPHONE HCL 1 MG/ML IJ SOLN
INTRAMUSCULAR | Status: DC | PRN
  Administered 2014-08-23: 0.5 mg via INTRAVENOUS

## 2014-08-23 MED ORDER — HEPARIN (PORCINE) IN NACL 2-0.9 UNIT/ML-% IJ SOLN
INTRAMUSCULAR | Status: AC
  Filled 2014-08-23: qty 1000

## 2014-08-23 MED ORDER — AMLODIPINE BESYLATE 5 MG PO TABS
10.0000 mg | ORAL_TABLET | Freq: Every day | ORAL | Status: DC
  Administered 2014-08-24: 11:00:00 10 mg via ORAL
  Filled 2014-08-23: qty 2

## 2014-08-23 MED ORDER — CLOPIDOGREL BISULFATE 300 MG PO TABS
ORAL_TABLET | ORAL | Status: AC
Start: 2014-08-23 — End: 2014-08-23
  Filled 2014-08-23: qty 1

## 2014-08-23 MED ORDER — CLOPIDOGREL BISULFATE 75 MG PO TABS
75.0000 mg | ORAL_TABLET | Freq: Every day | ORAL | Status: DC
  Administered 2014-08-24 – 2014-08-25 (×2): 75 mg via ORAL
  Filled 2014-08-23 (×2): qty 1

## 2014-08-23 MED ORDER — METOPROLOL SUCCINATE ER 50 MG PO TB24
50.0000 mg | ORAL_TABLET | Freq: Every day | ORAL | Status: DC
  Administered 2014-08-24 – 2014-08-25 (×2): 50 mg via ORAL
  Filled 2014-08-23 (×2): qty 1

## 2014-08-23 MED ORDER — ASPIRIN 81 MG PO CHEW
81.0000 mg | CHEWABLE_TABLET | Freq: Every day | ORAL | Status: DC
  Administered 2014-08-24 – 2014-08-25 (×2): 81 mg via ORAL
  Filled 2014-08-23 (×2): qty 1

## 2014-08-23 MED ORDER — MIDAZOLAM HCL 2 MG/2ML IJ SOLN
INTRAMUSCULAR | Status: AC
  Filled 2014-08-23: qty 2

## 2014-08-23 MED ORDER — NITROGLYCERIN 0.2 MG/ML ON CALL CATH LAB
INTRAVENOUS | Status: DC | PRN
  Administered 2014-08-23: 200 ug

## 2014-08-23 MED ORDER — MONTELUKAST SODIUM 10 MG PO TABS
10.0000 mg | ORAL_TABLET | Freq: Every day | ORAL | Status: DC
  Administered 2014-08-23 – 2014-08-24 (×2): 10 mg via ORAL
  Filled 2014-08-23 (×4): qty 1

## 2014-08-23 MED ORDER — PANTOPRAZOLE SODIUM 40 MG PO TBEC
40.0000 mg | DELAYED_RELEASE_TABLET | Freq: Every day | ORAL | Status: DC
  Administered 2014-08-24 – 2014-08-25 (×2): 40 mg via ORAL
  Filled 2014-08-23 (×2): qty 1

## 2014-08-23 MED ORDER — CYCLOBENZAPRINE HCL 5 MG PO TABS
5.0000 mg | ORAL_TABLET | Freq: Three times a day (TID) | ORAL | Status: DC | PRN
  Administered 2014-08-23: 5 mg via ORAL
  Filled 2014-08-23 (×2): qty 1

## 2014-08-23 MED ORDER — ACETAMINOPHEN 325 MG PO TABS
650.0000 mg | ORAL_TABLET | ORAL | Status: DC | PRN
  Administered 2014-08-23: 17:00:00 650 mg via ORAL
  Filled 2014-08-23: qty 2

## 2014-08-23 MED ORDER — CLOPIDOGREL BISULFATE 300 MG PO TABS
ORAL_TABLET | ORAL | Status: DC | PRN
  Administered 2014-08-23: 600 mg via ORAL

## 2014-08-23 MED ORDER — ALBUTEROL SULFATE (2.5 MG/3ML) 0.083% IN NEBU: 2.0000 mL | INHALATION_SOLUTION | Freq: Four times a day (QID) | RESPIRATORY_TRACT | Status: DC | PRN

## 2014-08-23 MED ORDER — MOMETASONE FURO-FORMOTEROL FUM 200-5 MCG/ACT IN AERO
1.0000 | INHALATION_SPRAY | Freq: Two times a day (BID) | RESPIRATORY_TRACT | Status: DC
  Administered 2014-08-23 – 2014-08-25 (×4): 1 via RESPIRATORY_TRACT
  Filled 2014-08-23: qty 8.8

## 2014-08-23 MED ORDER — NITROGLYCERIN 0.4 MG SL SUBL: 0.4000 mg | SUBLINGUAL_TABLET | SUBLINGUAL | Status: DC | PRN

## 2014-08-23 SURGICAL SUPPLY — 20 items
CATH CROSS OVER TEMPO 5F (CATHETERS) ×2 IMPLANT
CATH GUIDE IMA 7FR (CATHETERS) ×2 IMPLANT
CATH OMNI FLUSH 5F 65CM (CATHETERS) ×2 IMPLANT
CATH SOFT-VU 4F 65 STRAIGHT (CATHETERS) ×1 IMPLANT
CATH SOFT-VU STRAIGHT 4F 65CM (CATHETERS) ×1
CATH STRAIGHT 5FR 65CM (CATHETERS) ×2 IMPLANT
HAND CONTROLLER AVANTA (MISCELLANEOUS) IMPLANT
KIT ENCORE 26 ADVANTAGE (KITS) ×2 IMPLANT
KIT PV (KITS) ×2 IMPLANT
SET AVANTA MULTI PATIENT (MISCELLANEOUS) IMPLANT
SET AVANTA SINGLE PATIENT (MISCELLANEOUS) ×2 IMPLANT
SHEATH AVANTA HAND CONTROLLER (MISCELLANEOUS) ×2 IMPLANT
SHEATH PINNACLE 5F 10CM (SHEATH) ×2 IMPLANT
SHEATH PINNACLE 7F 10CM (SHEATH) ×2 IMPLANT
STENT PALMAZ BLUE 6X18X80 (Permanent Stent) ×2 IMPLANT
SYR MEDRAD MARK V 150ML (SYRINGE) IMPLANT
TRANSDUCER W/STOPCOCK (MISCELLANEOUS) ×2 IMPLANT
TRAY PV CATH (CUSTOM PROCEDURE TRAY) ×2 IMPLANT
WIRE HITORQ VERSACORE ST 145CM (WIRE) ×2 IMPLANT
WIRE SPARTACORE .014X190CM (WIRE) ×2 IMPLANT

## 2014-08-23 NOTE — Progress Notes (Signed)
Site area: right groin  Site Prior to Removal:  Level 0  Pressure Applied For 25 MINUTES    Minutes Beginning at 1500  Manual:   Yes.    Patient Status During Pull:  AAO x4  Post Pull Groin Site:  Level 0  Post Pull Instructions Given:  Yes.    Post Pull Pulses Present:  Yes.    Dressing Applied:  Yes.    Comments:  Arterial sheath pulled by Harrison Mons , Pt tolerated procedure well

## 2014-08-23 NOTE — Interval H&P Note (Signed)
History and Physical Interval Note:  08/23/2014 11:15 AM  [name]  has presented today for surgery, with the diagnosis of pvd  The various methods of treatment have been discussed with the patient and family. After consideration of risks, benefits and other options for treatment, the patient has consented to  Procedure(s): Renal Angiography (N/A) and possible angioplasty as a surgical intervention. Patient on 5 antihypertensive medications and BP continues to be uncontrolled. Renal duplex abnormal.   The patient's history has been reviewed, patient examined, no change in status, stable for surgery.  I have reviewed the patient's chart and labs.  Questions were answered to the patient's satisfaction.     Adrian Prows

## 2014-08-23 NOTE — Progress Notes (Signed)
Patient arrived to 6C05 via bed. Patient awake, alert, no apparent discomfort or distress observed. Patient denies pain or discomfort. Patient with R femoral sheath in place. Patient placed on telemetry, vitals obtained. Orders reviewed. Charge Nurse aware of presence of sheath. Patient oriented to bed, room, orders discussed with patient. Bed low and locked, side rails up x2, call bell within reach. Will continue to monitor.  Roselyn Reef Derian Pfost,RN

## 2014-08-24 ENCOUNTER — Ambulatory Visit (HOSPITAL_COMMUNITY): Payer: Medicaid Other

## 2014-08-24 DIAGNOSIS — I97610 Postprocedural hemorrhage and hematoma of a circulatory system organ or structure following a cardiac catheterization: Secondary | ICD-10-CM

## 2014-08-24 DIAGNOSIS — I150 Renovascular hypertension: Secondary | ICD-10-CM | POA: Diagnosis not present

## 2014-08-24 LAB — BASIC METABOLIC PANEL
Anion gap: 13 (ref 5–15)
BUN: 8 mg/dL (ref 6–20)
CO2: 25 mmol/L (ref 22–32)
CREATININE: 0.73 mg/dL (ref 0.44–1.00)
Calcium: 9.3 mg/dL (ref 8.9–10.3)
Chloride: 102 mmol/L (ref 101–111)
GFR calc Af Amer: >60 mL/min (ref >60)
GFR calc non Af Amer: >60 mL/min (ref >60)
GLUCOSE: 94 mg/dL (ref 65–99)
POTASSIUM: 2.8 mmol/L — AB (ref 3.5–5.1)
Sodium: 140 mmol/L (ref 135–145)

## 2014-08-24 MED ORDER — LIDOCAINE HCL (PF) 1 % IJ SOLN
INTRAMUSCULAR | Status: AC
  Filled 2014-08-24: qty 30

## 2014-08-24 MED ORDER — MIDAZOLAM HCL 2 MG/2ML IJ SOLN
1.0000 mg | INTRAMUSCULAR | Status: DC | PRN
  Administered 2014-08-24: 13:00:00 2 mg via INTRAVENOUS
  Filled 2014-08-24: qty 2

## 2014-08-24 MED ORDER — VANCOMYCIN HCL IN DEXTROSE 1-5 GM/200ML-% IV SOLN
1000.0000 mg | Freq: Once | INTRAVENOUS | Status: AC
  Administered 2014-08-24: 1000 mg via INTRAVENOUS
  Filled 2014-08-24: qty 200

## 2014-08-24 MED ORDER — CLOPIDOGREL BISULFATE 75 MG PO TABS: 75.0000 mg | ORAL_TABLET | Freq: Every day | ORAL | Status: DC

## 2014-08-24 MED ORDER — POTASSIUM CHLORIDE CRYS ER 20 MEQ PO TBCR
40.0000 meq | EXTENDED_RELEASE_TABLET | Freq: Once | ORAL | Status: AC
  Administered 2014-08-24: 40 meq via ORAL
  Filled 2014-08-24: qty 2

## 2014-08-24 MED ORDER — FENTANYL CITRATE (PF) 100 MCG/2ML IJ SOLN
100.0000 ug | INTRAMUSCULAR | Status: DC | PRN
  Administered 2014-08-24 (×2): 50 ug via INTRAVENOUS
  Filled 2014-08-24: qty 2

## 2014-08-24 MED ORDER — THROMBIN 5000 UNITS EX SOLR
5000.0000 [IU] | Freq: Once | CUTANEOUS | Status: AC
  Administered 2014-08-24: 5000 [IU] via TOPICAL
  Filled 2014-08-24: qty 5000

## 2014-08-24 MED ORDER — MAGNESIUM SULFATE 2 GM/50ML IV SOLN
2.0000 g | Freq: Once | INTRAVENOUS | Status: AC
  Administered 2014-08-24: 19:00:00 2 g via INTRAVENOUS
  Filled 2014-08-24: qty 50

## 2014-08-24 MED FILL — Heparin Sodium (Porcine) 2 Unit/ML in Sodium Chloride 0.9%: INTRAMUSCULAR | Qty: 1000 | Status: AC

## 2014-08-24 MED FILL — Lidocaine HCl Local Preservative Free (PF) Inj 1%: INTRAMUSCULAR | Qty: 30 | Status: AC

## 2014-08-24 NOTE — Discharge Summary (Addendum)
Physician Discharge Summary  Patient ID: [name] MRN: [id number] DOB/AGE: 08-01-1970 44 y.o.  Admit date: 08/23/2014 Discharge date: 08/25/2014  Primary Discharge Diagnosis: Renovascular Hypertension, left renal artery stenosis s/p  6x18 mm Cordis Blue Stent.  Significant Diagnostic Studies: Renal angiography: 08/23/2014: Left renal artery 80% to 0% by 6x18 mm Cordis Blue Stent. 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  Procedure performed: Thrombin injection under direct ultrasound visualization of the right femoral pseudoaneurysm sac. Successful complete occlusion of the pseudoaneurysm sac without any complications. Pre-and postprocedure femoral arterial duplex/Dopplers and posterior tibial and anterior tibial Dopplers normal. Patient also procedure well.  Hospital Course: [name] is a 44 year old African American female with difficult to control HTN even on 5 agents with renal artery stenosis diagnosed by renal duplex. She was scheduled for renal angiogram on an elective basis and underwent successful stent placement to the left renal artery with reduction in stenosis from 80% to 0%.  Clonidine, HCTZ, and lisinopril have been held with SBP in the 110s-120s and DBP in the 50s-60s.  She had developed pseudoaneurysm of the right femoral artery post procedure and yesterday underwent thrombin injection into the sac and procedure successful. This morning Moderate ecchymosis and no tenderness or bruit to the right femoral access site. Otherwise, denies any symptoms or concerns this morning.    Recommendations on discharge: Continue metoprolol and amlodipine as BP has been stable through the night with these medications. D/C HCTZ, lisinopril, and clonidine. Begin Plavix and continue ASA. Follow up outpatient in 2 weeks as scheduled. Change Omeprazole to Protonix due to plavix interaction. Plavix for 3  months along with ASA, then ASA indefinately. Repeat renal duplex in 3 months for surveillance.  Discharge Exam: Blood pressure 120/55, pulse 61, temperature 97.9 F (36.6 C), temperature source Oral, resp. rate 21, height 5\' 3"  (1.6 m), weight 68 kg (149 lb 14.6 oz), SpO2 100 %.    Physical Exam: GENERAL APPEARANCE- Alert, Oriented. Well built, nourished HEENT- Unremarkable, fundi were not examined. NECK- No JVD. Carotid pulses are 2+, No bruits audible. No thyromegaly. No lymphadenopathy. HEART- Auscultation- Normal S1, S2. Gr. 2/6 mid systolic murmur is audible at apex, conducted to the axilla. CHEST- Normal shape. Normal percussion. Auscultation- Normal breath sounds, No crepitations. No wheezing. ABDOMEN- Palpation- Soft, Nontender. No hepatosplenomegaly. No masses felt. Auscultation- No bruits audible over aorta, or renal arteries EXTREMITIES- No Clubbing or Cyanosis. No edema on legs or feet. PERIPHERAL PULSES- Both femoral pulses- 2+, right femoral area ecchymosis without tenderness or bruit, Both dorsalis pedis pulses- 2+, Both posterior tibial pulses- 2+.   Labs:   Lab Results  Component Value Date   WBC 9.9 06/21/2011   HGB 14.4 06/21/2011   HCT 43.2 06/21/2011   MCV 89.1 06/21/2011   PLT 232 06/21/2011    Recent Labs Lab 08/24/14 0415  NA 140  K 2.8*  CL 102  CO2 25  BUN 8  CREATININE 0.73  CALCIUM 9.3  GLUCOSE 94    FOLLOW UP PLANS AND APPOINTMENTS Discharge Instructions    Discharge patient    Complete by:  As directed             Medication List    STOP taking these medications        cloNIDine 0.2 MG tablet  Commonly known as:  CATAPRES     hydrochlorothiazide 25 MG tablet  Commonly known as:  HYDRODIURIL     ibuprofen 800 MG tablet  Commonly known as:  ADVIL,MOTRIN     lisinopril 40 MG tablet  Commonly known as:  PRINIVIL,ZESTRIL     omeprazole 40 MG capsule  Commonly known as:  PRILOSEC  Replaced by:  pantoprazole 40 MG tablet       TAKE these medications        albuterol 108 (90 BASE) MCG/ACT inhaler  Commonly known as:  PROVENTIL HFA;VENTOLIN HFA  Inhale 2 puffs into the lungs every 4 (four) hours as needed. As needed for shortness of breath/asthma.     amLODipine 10 MG tablet  Commonly known as:  NORVASC  Take 10 mg by mouth daily.     aspirin 81 MG chewable tablet  Chew 81 mg by mouth daily.     clopidogrel 75 MG tablet  Commonly known as:  PLAVIX  Take 1 tablet (75 mg total) by mouth daily with breakfast.     cyclobenzaprine 5 MG tablet  Commonly known as:  FLEXERIL  Take 1 tablet (5 mg total) by mouth every 8 (eight) hours as needed for muscle spasms.     metoprolol succinate 50 MG 24 hr tablet  Commonly known as:  TOPROL-XL  Take 50 mg by mouth daily.     mometasone-formoterol 200-5 MCG/ACT Aero  Commonly known as:  DULERA  Inhale 1 puff into the lungs 2 (two) times daily.     montelukast 10 MG tablet  Commonly known as:  SINGULAIR  Take 10 mg by mouth at bedtime.     nitroGLYCERIN 0.4 MG SL tablet  Commonly known as:  NITROSTAT  Place 0.4 mg under the tongue every 5 (five) minutes as needed for chest pain.     nortriptyline 10 MG capsule  Commonly known as:  PAMELOR  Take 1 capsule (10 mg total) by mouth at bedtime.     pantoprazole 40 MG tablet  Commonly known as:  PROTONIX  Take 1 tablet (40 mg total) by mouth daily before breakfast.           Follow-up Information    Follow up with Despina Hick, MD On 09/06/2014.   Specialty:  Cardiology   Why:  at 2:30pm   Contact information:   [facility] [place] [address] [place], NP-C 08/24/2014, 8:17 AM [facility] Pager: [phone] Office: [phone]  I have personally reviewed the patient's record and performed physical exam and agree with the assessment and plan of Ms. Neldon Labella, NP-C.  Adrian Prows,  MD 08/25/2014, 8:59 AM [facility] [place] Pager: [phone] Office: [phone] If no answer: Cell:  [phone]

## 2014-08-24 NOTE — Procedures (Signed)
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  Procedure performed: Thrombin injection under direct ultrasound visualization of the right femoral pseudoaneurysm sac. Successful complete occlusion of the pseudoaneurysm sac without any complications. Pre-and postprocedure femoral arterial duplex/Dopplers and posterior tibial and anterior tibial Dopplers normal. Patient also procedure well.  Technique: Under local sterile precautions, using lidocaine for local anesthesia, I utilized a 22-gauge lumbar pressure needle to access the pseudoaneurysm sac. This was done under ultrasound guidance. Then re constituted 2.5 cc (2500 units) of 5000 units of thrombin was injected with complete obliteration of the pseudoaneurysm sac. There was no immediate complication.

## 2014-08-24 NOTE — Progress Notes (Signed)
Subjective:  Doing well. No complaints  Objective:  Vital Signs in the last 24 hours: Temp:  [97.7 F (36.5 C)-99.2 F (37.3 C)] 99.2 F (37.3 C) (06/01 1227) Pulse Rate:  [54-77] 58 (06/01 1227) Resp:  [19-25] 19 (06/01 1227) BP: (115-146)/(55-76) 130/68 mmHg (06/01 1227) SpO2:  [95 %-100 %] 95 % (06/01 1227) Weight:  [68 kg (149 lb 14.6 oz)] 68 kg (149 lb 14.6 oz) (06/01 0034)  Intake/Output from previous day: 05/31 0701 - 06/01 0700 In: 1150 [P.O.:240; I.V.:910] Out: 1800 [Urine:1800]  Physical Exam:   General appearance: alert, cooperative, appears stated age and no distress Eyes: negative findings: lids and lashes normal Neck: no adenopathy, no carotid bruit, no JVD, supple, symmetrical, trachea midline and thyroid not enlarged, symmetric, no tenderness/mass/nodules Neck: JVP - normal, carotids 2+= without bruits Resp: clear to auscultation bilaterally Chest wall: no tenderness Cardio: regular rate and rhythm, S1, S2 normal, no murmur, click, rub or gallop GI: soft, non-tender; bowel sounds normal; no masses,  no organomegaly Extremities: extremities normal, atraumatic, no cyanosis or edema and right femoral arterial access site shows mild ecchymosis.  There is acute tenderness in the right femoral artery, pulsatile mass felt.  Bruit heard.    Lab Results: BMP  Recent Labs  08/16/14 1444 08/24/14 0415  NA 145* 140  K 3.4* 2.8*  CL 101 102  CO2 23 25  GLUCOSE 96 94  BUN 8 8  CREATININE 0.83 0.73  CALCIUM 10.0 9.3  GFRNONAA 87 >60  GFRAA 101 >60    CBC No results for input(s): WBC, RBC, HGB, HCT, PLT, MCV, MCH, MCHC, RDW, LYMPHSABS, MONOABS, EOSABS, BASOSABS in the last 168 hours.  Invalid input(s): NEUTRABS  HEMOGLOBIN A1C No results found for: HGBA1C, MPG  Cardiac Panel (last 3 results) No results for input(s): CKTOTAL, CKMB, TROPONINI, RELINDX in the last 8760 hours.  BNP (last 3 results) No results for input(s): PROBNP in the last 8760  hours.  TSH No results for input(s): TSH in the last 8760 hours.  CHOLESTEROL No results for input(s): CHOL in the last 8760 hours.  Hepatic Function Panel  Recent Labs  08/16/14 1444  PROT 7.4  AST 12  ALT 9  ALKPHOS 112  BILITOT 0.2    Imaging: Imaging results have been reviewed  Cardiac Studies: 08/23/2014: Abdominal aortogram: There were 2 renal arteries one on either sides. Left renal artery had a 50-60% stenosis angiographically, however after passing the guidewire having confirmed severity of stenosis by pressure pullback, the stenosis was 80-90%. Right renal artery was widely patent without any stenosis.  The right iliac artery showed mild luminal irregularity. There was no high-grade stenosis in the aortoiliac bifurcation.  Interventional data: Successful PTA and stenting of the left renal artery. High-grade 90% left renal artery stenosis, reduced to 0%. 6.0 x 18 mm Cordis Blue balloon-expandable stent implanted. Pre-PTA pressure gradient of 65 mmHg reduced to 0 mmHg.  Scheduled Meds: . amLODipine  10 mg Oral Daily  . aspirin  81 mg Oral Daily  . clopidogrel  75 mg Oral Q breakfast  . metoprolol succinate  50 mg Oral Daily  . mometasone-formoterol  1 puff Inhalation BID  . montelukast  10 mg Oral QHS  . nortriptyline  10 mg Oral QHS  . pantoprazole  40 mg Oral Daily  . thrombin  5,000 Units Topical Once  . vancomycin  1,000 mg Intravenous Once   Continuous Infusions:  PRN Meds:.acetaminophen, albuterol, cyclobenzaprine, fentaNYL (SUBLIMAZE) injection, midazolam, nitroGLYCERIN, ondansetron (  ZOFRAN) IV    Assessment/Plan:  1.  Renovascular hypertension, left renal artery stenosis, high-grade stenosis reduced to 0% with successful stenting.  Blood pressure controlled in spite of discontinuation of 3 antihypertensive medications. 2.  Right groin pseudoaneurysm clinically. 3.  Tobacco use disorder  Recommendation: I will obtain lower Ixodes arterial duplex of the  right leg to confirm right leg pseudoaneurysm.  Management will depend on the findings.  From blood pressure standpoint, blood pressure is very well controlled only on 2 medications.  Smoking cessation has been discussed with the patient at length.  If negative for pseudoaneurysm, she'll be discharged home today.    Adrian Prows, M.D. 08/24/2014, 5:26 PM [facility] Pager: [phone] Office: [phone] If no answer: [phone]

## 2014-08-24 NOTE — Progress Notes (Signed)
I have reviewed the images of the [place] and right femoral pseudoaneurysm appears to be amenable for thrombin injection. I have discussed with the patient at length regarding the complications from thrombin injection including but not limited to need for urgent surgical revascularization, need for thrombectomy if there was a arterial embolization. Risks and benefits of surgical versus a cutaneous repair of the pseudoaneurysm was also discussed in detail with the patient, patient is aware and is willing to proceed.

## 2014-08-24 NOTE — Progress Notes (Addendum)
*  PRELIMINARY RESULTS* Vascular Ultrasound Ultrasound guided pseudoaneurysm thrombin injection has been performed by Dr. Einar Gip. After thrombin injection, the right common femoral artery pseudoaneurysm appears to be thrombosed with no internal flow. The right common femoral artery and right posterior tibial artery are patent with triphasic flow.  08/24/2014 2:11 PM Korri Ask, RVT, RDCS, RDMS    Limited Right Lower Extremity Arterial Duplex has been completed.   Study was technically limited due to poor acoustic penetration. There is evidence of a pseudoaneurysm of the right common femoral artery measuring 2cm in width at what appears to be the main chamber, with a possible second chamber. Unable to definitively identify this. The neck appears to bend, measuring 52mm from the pseudoaneurysm, then bends and measures 7mm to the common femoral artery. The neck is 2.36mm wide.  Preliminary results discussed with Dr. Einar Gip.  08/24/2014 10:36 AM Maudry Mayhew, RVT, RDCS, RDMS

## 2014-08-25 DIAGNOSIS — I150 Renovascular hypertension: Secondary | ICD-10-CM | POA: Diagnosis not present

## 2014-08-25 LAB — BASIC METABOLIC PANEL
ANION GAP: 9 (ref 5–15)
BUN: 7 mg/dL (ref 6–20)
CALCIUM: 9.1 mg/dL (ref 8.9–10.3)
CO2: 26 mmol/L (ref 22–32)
CREATININE: 0.66 mg/dL (ref 0.44–1.00)
Chloride: 104 mmol/L (ref 101–111)
GFR calc Af Amer: >60 mL/min (ref >60)
GFR calc non Af Amer: >60 mL/min (ref >60)
Glucose, Bld: 105 mg/dL — ABNORMAL HIGH (ref 65–99)
Potassium: 3.7 mmol/L (ref 3.5–5.1)
Sodium: 139 mmol/L (ref 135–145)

## 2014-08-25 MED ORDER — PANTOPRAZOLE SODIUM 40 MG PO TBEC: 40.0000 mg | DELAYED_RELEASE_TABLET | Freq: Every day | ORAL | Status: DC

## 2014-08-25 NOTE — Discharge Instructions (Addendum)
Renal Artery Stenosis Renal artery stenosis (RAS) is the narrowing of the artery that supplies blood to the kidney. If the narrowing is critical and the kidney does not get enough blood, hypertension (high blood pressure) can develop. This is called renal vascular hypertension (RVH). This is a common, uncommon cause of secondary hypertension. It does not usually happen until there is at least a 70% narrowing of the artery. Decreased blood flow through the renal artery causes the kidney to release increased amounts of a hormone. It is called renin. Renin is a strong blood pressure regulator. When it is high, it causes changes that lead to hypertension. Eventually the kidney not receiving enough blood may shrink in size and become less useful. The high blood pressure that is produced can eventually damage and destroy the remaining kidney. This is called hypertensive nephrosclerosis. If both kidneys fail, it will lead to chronic renal failure.  CAUSES  Most renal artery stenosis is caused by a hardening of the arteries (atherosclerosis). This is called Atherosclerotic Renal Artery Stenosis (AS-RAS). It is caused by a build-up of cholesterol (plaques) on the inner lining of the renal artery. A much less common cause is Fibromuscular Dysplasia (FMD). With it, there is an abnormality in the muscular lining of the renal artery. FMD-RAS occurs almost exclusively in women aged 24 to 47. It rarely affects African Americans or Asians.  SYMPTOMS  Often high blood pressure is discovered on a routine blood pressure check. It may be the only sign that something is wrong. Other problems that may occur are:  You may develop calf pain when walking. This is called intermittent claudication. It may be a sign of bad circulation in the legs.  Inability to use certain blood pressure pills such as angiotensin-I (ACE-I) inhibitors or angiotensin receptor blockers (ARB's). These could cause sudden drops in blood pressure with  worsening of kidney function.  More than three antihypertensive medications may be needed for blood pressure control.  New onset of high blood pressure if you are over 55. DIAGNOSIS  Your caregiver may find suggestions of this on exam if he finds bruits (like murmurs) on listening to your abdomen (belly) or the large arteries in your neck. Your caregiver may also suspect this there is a sudden worsening of your blood pressure when it has been well controlled and you are over age 84. Additional testing that may be done includes:  One diagnostic method used for renal artery stenosis (RAS) is to measure and compare the level of renin, (blood pressure-regulating hormone released by the kidneys), in the right to the left renal veins. If the amount of renin released by one-side is markedly higher than the other, this identifies a high renin-releasing kidney consistent with RAS.  FMD-RAS is often found on renal scan with ACE-inhibitor challenge, or ultrasound with Doppler.  FMD responds well to angioplasty and stenting. The results of stenting in FMD are usually long lasting. RISK FACTORS  Most renal artery stenosis is caused by a hardening of the arteries. This is called atherosclerosis. Other risk factors associated with the development of atherosclerotic RAS include the following:   Carotid artery disease.  Obesity.  High blood pressure.  Heredity.  Old age.  Fibromuscular dysplasia.  Diabetes mellitus.  Smoking.  Hardening of the arteries. TREATMENT   Renal vascular hypertension can be very severe. It can also be difficult to control.  Medication is used to control high blood pressure (hypertension). Blood pressure medications that directly affect the renin angiotensin pathway  can be used toe help control blood pressure. ACE inhibitors and angiotensin receptor blockers (ARB's) are often effective in patients with unilateral RAS. In some cases, patients with RAS are resistant to  these medications.  In patients with bilateral RAS, these medications must be used carefully. They may cause acute renal failure (ARF). If acute renal failure develops (if creatinine increases by more than 30%), the medication is discontinued. The patient is evaluated for bilateral RAS.  Angioplasty and stenting may be used to improve blood flow. The goal is to improve the circulation of blood flow to the kidney and prevent the release of excess renin, which can help to decrease blood pressure. This helps to prevent atrophy of the kidney. In general, patients with AS-RAS should have stenting done. This is because plasty by itself has a high incidence of re-stenosis.  Surgery to bypass the narrowing may be done. If the kidney with RAS has diminished in size or strength (atrophied ), surgical removal of the kidney may be advised. This is called nephrectomy. Document Released: 12/05/2004 Document Revised: 06/03/2011 Document Reviewed: 06/24/2013 [facility] Patient Information 2015 [place], [place]. This information is not intended to replace advice given to you by your health care provider. Make sure you discuss any questions you have with your health care provider   MEDICATIONS    STOP Clonidine (Catapress), Lisinopril ( Prinivil, Zestril), Hydrochlorothiazide (HTCZ, Hydrodiuril), Omeprazole (Prilosec)                                STOP Omeprazole(Prilosec), changed to Pantoprazole (Protonix). Taking Omeprazole and other like medications can interfere with the                                           effects of Clopidogrel (Plavix) and result in clotting at renal stent. Clopidogrel (Plavix) and Pantoprazole (Protonix) can be taken                                            together without interaction.                                START Clopidogrel (Plavix) and Pantoprazole (Protonix)

## 2014-08-26 ENCOUNTER — Inpatient Hospital Stay: Admission: RE | Admit: 2014-08-26 | Payer: Medicaid Other | Source: Ambulatory Visit

## 2014-09-07 ENCOUNTER — Encounter (INDEPENDENT_AMBULATORY_CARE_PROVIDER_SITE_OTHER): Payer: Medicaid Other | Admitting: Diagnostic Neuroimaging

## 2014-09-07 ENCOUNTER — Ambulatory Visit
Admission: RE | Admit: 2014-09-07 | Discharge: 2014-09-07 | Disposition: A | Discharge status: Home / Self Care | Payer: Medicaid Other | Source: Ambulatory Visit | Attending: Neurology | Admitting: Neurology

## 2014-09-07 DIAGNOSIS — R519 Headache, unspecified: Secondary | ICD-10-CM

## 2014-09-07 DIAGNOSIS — H538 Other visual disturbances: Secondary | ICD-10-CM

## 2014-09-07 DIAGNOSIS — R51 Headache: Secondary | ICD-10-CM

## 2014-09-07 DIAGNOSIS — H9190 Unspecified hearing loss, unspecified ear: Secondary | ICD-10-CM

## 2014-09-09 ENCOUNTER — Telehealth: Payer: Self-pay

## 2014-09-09 NOTE — Telephone Encounter (Signed)
Informed pt of normal MRI results. Patient verbalized understanding.

## 2014-09-13 ENCOUNTER — Encounter: Payer: Self-pay | Admitting: Neurology

## 2014-09-13 ENCOUNTER — Ambulatory Visit (INDEPENDENT_AMBULATORY_CARE_PROVIDER_SITE_OTHER): Payer: Medicaid Other | Admitting: Neurology

## 2014-09-13 VITALS — BP 148/94 | HR 90 | Resp 16 | Ht 63.0 in | Wt 158.0 lb

## 2014-09-13 DIAGNOSIS — G2581 Restless legs syndrome: Secondary | ICD-10-CM | POA: Diagnosis not present

## 2014-09-13 DIAGNOSIS — I10 Essential (primary) hypertension: Secondary | ICD-10-CM

## 2014-09-13 DIAGNOSIS — R51 Headache: Secondary | ICD-10-CM | POA: Diagnosis not present

## 2014-09-13 DIAGNOSIS — G4761 Periodic limb movement disorder: Secondary | ICD-10-CM | POA: Diagnosis not present

## 2014-09-13 DIAGNOSIS — R0683 Snoring: Secondary | ICD-10-CM

## 2014-09-13 DIAGNOSIS — R351 Nocturia: Secondary | ICD-10-CM | POA: Diagnosis not present

## 2014-09-13 DIAGNOSIS — R519 Headache, unspecified: Secondary | ICD-10-CM

## 2014-09-13 DIAGNOSIS — G4719 Other hypersomnia: Secondary | ICD-10-CM | POA: Diagnosis not present

## 2014-09-13 NOTE — Progress Notes (Signed)
Subjective:    Patient ID: [name] is a 44 y.o. female.  HPI     Star Age, MD, PhD [facility] [address], [address]O. [place], [place] [address]  Dear [name],   I saw your patient, [name], upon your kind request in my clinic today for initial consultation of her sleep disorder, in particular, concern for underlying obstructive sleep apnea. The patient is unaccompanied today. As you know, [name] is a 44 year old right-handed woman with an underlying medical history of asthma (better on [place], per patient), hypertension, s/p L renal artery stent placement on 08/23/14, right-sided Bell's palsy in 2001 with residual right-sided facial weakness, and overweight state, who reports recurrent nocturnal headaches, snoring, and witnessed breathing pauses per boyfriend.  I reviewed your office note from 08/16/2014.  Her typical bedtime is around 10:30 PM and while she falls asleep quickly she has trouble staying asleep. She may wake up every hour. She has nocturia typically once per night, she wakes up with a headache approximately once a week. Rise time currently is 9:30 but she still does not wake up rested. Her Epworth sleepiness score is 14 out of 24 today, her fatigue score is 48 out of 63. She also endorses occasional restless leg symptoms but not every night and these are not very bothersome. She is known to twitch in her sleep according to her boyfriend.  She lives with 2 of her 4 children (ages 64 yo son, 24 yo daughter, 100 yo son, 74 yo daughter). She smokes, about 6 cig/day, trying to quit. She drinks 2 cups of coffee per day, 4 Dr. Malachi Bonds cans per day. She does not drink Alcohol.   Her Past Medical History Is Significant For: Past Medical History  Diagnosis Date  . Asthma   . Hypertension   . Bell's palsy   . GERD (gastroesophageal reflux disease)   . Lymphoma of lymph nodes of neck     Her Past Surgical History Is Significant  For: Past Surgical History  Procedure Laterality Date  . Cesarean section  [place]    x2  . Skin biopsy  2000  . Renal artery stent Left 08/23/2014  . Cardiac catheterization  08/23/2014  . Peripheral vascular catheterization N/A 08/23/2014    Procedure: Renal Angiography;  Surgeon: Adrian Prows, MD;  Location: [facility];  Service: Cardiovascular;  Laterality: N/A;    Her Family History Is Significant For: Family History  Problem Relation Age of Onset  . Migraines Neg Hx   . Hypertension Mother   . Cancer Mother   . Heart defect Father   . Asthma Father     Her Social History Is Significant For: History   Social History  . Marital Status: Single    Spouse Name: N/A  . Number of Children: 4  . Years of Education: 9   Occupational History  . Unemployed    Social History Main Topics  . Smoking status: Current Every Day Smoker -- 0.25 packs/day for 15 years    Types: Cigarettes  . Smokeless tobacco: Never Used     Comment: 6 cig per day  . Alcohol Use: No  . Drug Use: No  . Sexual Activity: Not on file   Other Topics Concern  . None   Social History Narrative   Lives at home with fiance and kids   Caffeine use: 2 cups coffee per day   4 Dr. Malachi Bonds (12oz) per day  Her Allergies Are:  No Known Allergies:   Her Current Medications Are:  Outpatient Encounter Prescriptions as of 09/13/2014  Medication Sig  . albuterol (PROVENTIL HFA;VENTOLIN HFA) 108 (90 BASE) MCG/ACT inhaler Inhale 2 puffs into the lungs every 4 (four) hours as needed. As needed for shortness of breath/asthma.  Marland Kitchen amLODipine (NORVASC) 10 MG tablet Take 10 mg by mouth daily.  Marland Kitchen aspirin 81 MG chewable tablet Chew 81 mg by mouth daily.  . clopidogrel (PLAVIX) 75 MG tablet Take 1 tablet (75 mg total) by mouth daily with breakfast.  . cyclobenzaprine (FLEXERIL) 5 MG tablet Take 1 tablet (5 mg total) by mouth every 8 (eight) hours as needed for muscle spasms.  . metoprolol succinate  (TOPROL-XL) 50 MG 24 hr tablet Take 50 mg by mouth daily.  . mometasone-formoterol (DULERA) 200-5 MCG/ACT AERO Inhale 1 puff into the lungs 2 (two) times daily.  . montelukast (SINGULAIR) 10 MG tablet Take 10 mg by mouth at bedtime.  . nitroGLYCERIN (NITROSTAT) 0.4 MG SL tablet Place 0.4 mg under the tongue every 5 (five) minutes as needed for chest pain.  . nortriptyline (PAMELOR) 10 MG capsule Take 1 capsule (10 mg total) by mouth at bedtime.  . pantoprazole (PROTONIX) 40 MG tablet Take 1 tablet (40 mg total) by mouth daily before breakfast.   No facility-administered encounter medications on file as of 09/13/2014.  :  Review of Systems:  Out of a complete 14 point review of systems, all are reviewed and negative with the exception of these symptoms as listed below:   Review of Systems  Constitutional: Positive for fatigue.  Eyes:       Blurred vision   Respiratory: Positive for cough, shortness of breath and wheezing.   Cardiovascular: Positive for chest pain and leg swelling.  Endocrine:       Feeling hot   Neurological: Positive for numbness and headaches.       Snoring, Restless legs, trouble falling and staying asleep, witnessed apnea, wakes up feeling tired in the morning, denies taking naps, daytime tiredness.   Psychiatric/Behavioral:       Not enough sleep, disinterest in activities, racing thoughts    Objective:  Neurologic Exam  Physical Exam Physical Examination:   Filed Vitals:   09/13/14 1003  BP: 148/94  Pulse: 90  Resp: 16    General Examination: The patient is a very pleasant 44 y.o. female in no acute distress. She appears well-developed and well-nourished and well groomed.   HEENT: Normocephalic, atraumatic, pupils are equal, round and reactive to light and accommodation. Funduscopic exam is normal with sharp disc margins noted. Extraocular tracking is good without limitation to gaze excursion or nystagmus noted. Normal smooth pursuit is noted. Hearing is  grossly intact. Tympanic membranes are clear bilaterally. Face is asymmetric with mild R lower facial weakness and mild R lower hemifacial spasms, with normal facial sensation. Speech is clear with no dysarthria noted. There is no hypophonia. There is no lip, neck/head, jaw or voice tremor. Neck is supple with full range of passive and active motion. There are no carotid bruits on auscultation. Oropharynx exam reveals: mild mouth dryness, marginal dental hygiene and mild airway crowding, due to redundant soft palate and thicker tongue. Tonsils are small bilaterally, uvula is small. Mallampati is class II. Tongue protrudes centrally and palate elevates symmetrically. Neck size is 13.5 inches. She has a Mild overbite.   Chest: Clear to auscultation without wheezing, rhonchi or crackles noted.  Heart: S1+S2+0, regular and  normal without murmurs, rubs or gallops noted.   Abdomen: Soft, non-tender and non-distended with normal bowel sounds appreciated on auscultation.  Extremities: There is no pitting edema in the distal lower extremities bilaterally. Pedal pulses are intact.  Skin: Warm and dry without trophic changes noted. There are no varicose veins.  Musculoskeletal: exam reveals no obvious joint deformities, tenderness or joint swelling or erythema.   Neurologically:  Mental status: The patient is awake, alert and oriented in all 4 spheres. Her immediate and remote memory, attention, language skills and fund of knowledge are appropriate. There is no evidence of aphasia, agnosia, apraxia or anomia. Speech is clear with normal prosody and enunciation. Thought process is linear. Mood is normal and affect is normal.  Cranial nerves II - XII are as described above under HEENT exam. In addition: shoulder shrug is normal with equal shoulder height noted. Motor exam: Normal bulk, strength and tone is noted. There is no drift, tremor or rebound. Romberg is negative. Reflexes are 2+ throughout. Fine motor  skills and coordination: intact with normal finger taps, normal hand movements, normal rapid alternating patting, normal foot taps and normal foot agility.  Cerebellar testing: No dysmetria or intention tremor on finger to nose testing. Heel to shin is unremarkable bilaterally. There is no truncal or gait ataxia.  Sensory exam: intact to light touch, pinprick, vibration, temperature sense in the upper and lower extremities.  Gait, station and balance: She stands easily. No veering to one side is noted. No leaning to one side is noted. Posture is age-appropriate and stance is narrow based. Gait shows normal stride length and normal pace. No problems turning are noted. She turns en bloc. Tandem walk is unremarkable.   Assessment and Plan:   In summary, Runell Kovich is a very pleasant 44 y.o.-year old female with an underlying medical history of asthma (better on [facility], per patient), hypertension, s/p L renal artery stent placement on 08/23/14, right-sided Bell's palsy in 2001 with residual right-sided facial weakness, and overweight state, who reports recurrent nocturnal headaches, snoring, and witnessed breathing pauses per boyfriend, excessive daytime somnolence, nocturia and difficulty control high blood pressure. In addition, she reports occasional restless leg symptoms and leg twitching at night. She recently had a renal artery stent placed. Her history and physical exam are indeed concerning for obstructive sleep apnea (OSA). I had a long chat with the patient about my findings and the diagnosis of OSA, its prognosis and treatment options. We talked about medical treatments, surgical interventions and non-pharmacological approaches. I explained in particular the risks and ramifications of untreated moderate to severe OSA, especially with respect to developing cardiovascular disease down the Road, including congestive heart failure, difficult to treat hypertension, cardiac arrhythmias, or stroke. Even  type 2 diabetes has, in part, been linked to untreated OSA. Symptoms of untreated OSA include daytime sleepiness, memory problems, mood irritability and mood disorder such as depression and anxiety, lack of energy, as well as recurrent headaches, especially morning headaches. We talked about smoking cessation and trying to maintain a healthy lifestyle in general, as well as the importance of weight control. I encouraged the patient to eat healthy, exercise daily and keep well hydrated, to keep a scheduled bedtime and wake time routine, to not skip any meals and eat healthy snacks in between meals. I advised the patient not to drive when feeling sleepy. I recommended the following at this time: sleep study with potential positive airway pressure titration. (We will score hypopneas at 4% and split  the sleep study into diagnostic and treatment portion, if the estimated. 2 hour AHI is >15/h).   I explained the sleep test procedure to the patient and also outlined possible surgical and non-surgical treatment options of OSA, including the use of a custom-made dental device (which would require a referral to a specialist dentist or oral surgeon), upper airway surgical options, such as pillar implants, radiofrequency surgery, tongue base surgery, and UPPP (which would involve a referral to an ENT surgeon). Rarely, jaw surgery such as mandibular advancement may be considered.  I also explained the CPAP treatment option to the patient, who indicated that she would be willing to try CPAP if the need arises. I explained the importance of being compliant with PAP treatment, not only for insurance purposes but primarily to improve Her symptoms, and for the patient's long term health benefit, including to reduce Her cardiovascular risks. I answered all her questions today and the patient was in agreement. I would like to see her back after the sleep study is completed and encouraged her to call with any interim questions,  concerns, problems or updates.   Thank you very much for allowing me to participate in the care of this nice patient. If I can be of any further assistance to you please do not hesitate to talk to me.   Sincerely,   Star Age, MD, PhD  I spent 20 minutes in total face-to-face time with the patient, more than 50% of which was spent in counseling and coordination of care, reviewing test results, reviewing medication and discussing or reviewing the diagnosis of OSA, its prognosis and treatment options.

## 2014-09-13 NOTE — Patient Instructions (Signed)

## 2014-10-04 ENCOUNTER — Telehealth: Payer: Self-pay | Admitting: *Deleted

## 2014-12-13 ENCOUNTER — Telehealth: Payer: Self-pay | Admitting: Neurology

## 2014-12-13 NOTE — Telephone Encounter (Signed)
Called and spoke with patient concerning rescheduling her sleep study and patient states she doesn't have transportation

## 2015-02-20 ENCOUNTER — Ambulatory Visit: Payer: Medicaid Other | Admitting: Neurology

## 2015-02-20 ENCOUNTER — Telehealth: Payer: Self-pay | Admitting: *Deleted

## 2015-02-20 NOTE — Telephone Encounter (Signed)
No showed f/u appt.  

## 2015-02-21 ENCOUNTER — Encounter: Payer: Self-pay | Admitting: Neurology

## 2015-03-26 DIAGNOSIS — I219 Acute myocardial infarction, unspecified: Secondary | ICD-10-CM

## 2015-03-26 HISTORY — DX: Acute myocardial infarction, unspecified: I21.9

## 2015-09-17 ENCOUNTER — Other Ambulatory Visit: Payer: Self-pay

## 2015-09-17 ENCOUNTER — Other Ambulatory Visit (HOSPITAL_COMMUNITY): Payer: Self-pay

## 2015-09-17 ENCOUNTER — Inpatient Hospital Stay (HOSPITAL_COMMUNITY)
Admission: EM | Admit: 2015-09-17 | Discharge: 2015-09-20 | DRG: 281 | Disposition: A | Discharge status: Home / Self Care | Payer: Medicaid Other | Attending: Internal Medicine | Admitting: Internal Medicine

## 2015-09-17 ENCOUNTER — Encounter (HOSPITAL_COMMUNITY): Payer: Self-pay | Admitting: Emergency Medicine

## 2015-09-17 ENCOUNTER — Emergency Department (HOSPITAL_COMMUNITY): Payer: Medicaid Other

## 2015-09-17 DIAGNOSIS — I214 Non-ST elevation (NSTEMI) myocardial infarction: Secondary | ICD-10-CM

## 2015-09-17 DIAGNOSIS — E876 Hypokalemia: Secondary | ICD-10-CM | POA: Diagnosis present

## 2015-09-17 DIAGNOSIS — I160 Hypertensive urgency: Secondary | ICD-10-CM

## 2015-09-17 DIAGNOSIS — G4733 Obstructive sleep apnea (adult) (pediatric): Secondary | ICD-10-CM | POA: Diagnosis present

## 2015-09-17 DIAGNOSIS — J4540 Moderate persistent asthma, uncomplicated: Secondary | ICD-10-CM | POA: Diagnosis present

## 2015-09-17 DIAGNOSIS — F17210 Nicotine dependence, cigarettes, uncomplicated: Secondary | ICD-10-CM | POA: Diagnosis present

## 2015-09-17 DIAGNOSIS — J9811 Atelectasis: Secondary | ICD-10-CM | POA: Diagnosis present

## 2015-09-17 DIAGNOSIS — Z8579 Personal history of other malignant neoplasms of lymphoid, hematopoietic and related tissues: Secondary | ICD-10-CM

## 2015-09-17 DIAGNOSIS — I161 Hypertensive emergency: Secondary | ICD-10-CM | POA: Diagnosis present

## 2015-09-17 DIAGNOSIS — Z7902 Long term (current) use of antithrombotics/antiplatelets: Secondary | ICD-10-CM

## 2015-09-17 DIAGNOSIS — I10 Essential (primary) hypertension: Secondary | ICD-10-CM | POA: Diagnosis present

## 2015-09-17 DIAGNOSIS — R079 Chest pain, unspecified: Secondary | ICD-10-CM | POA: Diagnosis present

## 2015-09-17 DIAGNOSIS — Z8249 Family history of ischemic heart disease and other diseases of the circulatory system: Secondary | ICD-10-CM

## 2015-09-17 DIAGNOSIS — E785 Hyperlipidemia, unspecified: Secondary | ICD-10-CM | POA: Diagnosis present

## 2015-09-17 DIAGNOSIS — I081 Rheumatic disorders of both mitral and tricuspid valves: Secondary | ICD-10-CM | POA: Diagnosis present

## 2015-09-17 DIAGNOSIS — K219 Gastro-esophageal reflux disease without esophagitis: Secondary | ICD-10-CM | POA: Diagnosis present

## 2015-09-17 DIAGNOSIS — J45909 Unspecified asthma, uncomplicated: Secondary | ICD-10-CM | POA: Diagnosis present

## 2015-09-17 DIAGNOSIS — Z7982 Long term (current) use of aspirin: Secondary | ICD-10-CM

## 2015-09-17 DIAGNOSIS — R609 Edema, unspecified: Secondary | ICD-10-CM

## 2015-09-17 DIAGNOSIS — I701 Atherosclerosis of renal artery: Secondary | ICD-10-CM

## 2015-09-17 DIAGNOSIS — Z8572 Personal history of non-Hodgkin lymphomas: Secondary | ICD-10-CM

## 2015-09-17 DIAGNOSIS — G510 Bell's palsy: Secondary | ICD-10-CM | POA: Diagnosis present

## 2015-09-17 DIAGNOSIS — Z79899 Other long term (current) drug therapy: Secondary | ICD-10-CM

## 2015-09-17 DIAGNOSIS — Z9119 Patient's noncompliance with other medical treatment and regimen: Secondary | ICD-10-CM

## 2015-09-17 DIAGNOSIS — I150 Renovascular hypertension: Secondary | ICD-10-CM

## 2015-09-17 HISTORY — DX: Cardiomegaly: I51.7

## 2015-09-17 HISTORY — DX: Atherosclerosis of renal artery: I70.1

## 2015-09-17 LAB — CBC WITH DIFFERENTIAL/PLATELET
Basophils Absolute: 0 10*3/uL (ref 0.0–0.1)
Basophils Relative: 0 %
Eosinophils Absolute: 0.3 10*3/uL (ref 0.0–0.7)
Eosinophils Relative: 2 %
HCT: 41.4 % (ref 36.0–46.0)
Hemoglobin: 14.5 g/dL (ref 12.0–15.0)
Lymphocytes Relative: 32 %
Lymphs Abs: 4 10*3/uL (ref 0.7–4.0)
MCH: 30.8 pg (ref 26.0–34.0)
MCHC: 35 g/dL (ref 30.0–36.0)
MCV: 87.9 fL (ref 78.0–100.0)
Monocytes Absolute: 0.8 10*3/uL (ref 0.1–1.0)
Monocytes Relative: 6 %
Neutro Abs: 7.4 10*3/uL (ref 1.7–7.7)
Neutrophils Relative %: 60 %
Platelets: 251 10*3/uL (ref 150–400)
RBC: 4.71 MIL/uL (ref 3.87–5.11)
RDW: 13.9 % (ref 11.5–15.5)
WBC: 12.5 10*3/uL — ABNORMAL HIGH (ref 4.0–10.5)

## 2015-09-17 LAB — BASIC METABOLIC PANEL
Anion gap: 8 (ref 5–15)
BUN: 6 mg/dL (ref 6–20)
CO2: 23 mmol/L (ref 22–32)
Calcium: 9.3 mg/dL (ref 8.9–10.3)
Chloride: 109 mmol/L (ref 101–111)
Creatinine, Ser: 0.77 mg/dL (ref 0.44–1.00)
GFR calc Af Amer: >60 mL/min (ref >60)
GFR calc non Af Amer: >60 mL/min (ref >60)
Glucose, Bld: 108 mg/dL — ABNORMAL HIGH (ref 65–99)
Potassium: 3 mmol/L — ABNORMAL LOW (ref 3.5–5.1)
Sodium: 140 mmol/L (ref 135–145)

## 2015-09-17 LAB — TROPONIN I: Troponin I: 0.13 ng/mL — ABNORMAL HIGH (ref <0.031)

## 2015-09-17 MED ORDER — ASPIRIN 81 MG PO CHEW: 324.0000 mg | CHEWABLE_TABLET | Freq: Once | ORAL | Status: DC

## 2015-09-17 MED ORDER — POTASSIUM CHLORIDE CRYS ER 20 MEQ PO TBCR
40.0000 meq | EXTENDED_RELEASE_TABLET | Freq: Once | ORAL | Status: AC
  Administered 2015-09-17: 40 meq via ORAL
  Filled 2015-09-17: qty 2

## 2015-09-17 MED ORDER — HEPARIN (PORCINE) IN NACL 100-0.45 UNIT/ML-% IJ SOLN
1050.0000 [IU]/h | INTRAMUSCULAR | Status: DC
  Administered 2015-09-17 – 2015-09-18 (×2): 900 [IU]/h via INTRAVENOUS
  Filled 2015-09-17 (×2): qty 250

## 2015-09-17 MED ORDER — NITROGLYCERIN IN D5W 200-5 MCG/ML-% IV SOLN
0.0000 ug/min | Freq: Once | INTRAVENOUS | Status: AC
  Administered 2015-09-17: 10 ug/min via INTRAVENOUS
  Filled 2015-09-17: qty 250

## 2015-09-17 MED ORDER — HEPARIN BOLUS VIA INFUSION
4000.0000 [IU] | Freq: Once | INTRAVENOUS | Status: AC
  Administered 2015-09-17: 4000 [IU] via INTRAVENOUS
  Filled 2015-09-17: qty 4000

## 2015-09-17 MED ORDER — HEPARIN (PORCINE) IN NACL 2-0.9 UNIT/ML-% IJ SOLN: INTRAMUSCULAR | Status: DC

## 2015-09-17 MED ORDER — METOPROLOL TARTRATE 5 MG/5ML IV SOLN
5.0000 mg | Freq: Once | INTRAVENOUS | Status: AC
  Administered 2015-09-17: 5 mg via INTRAVENOUS
  Filled 2015-09-17: qty 5

## 2015-09-17 MED ORDER — POTASSIUM CHLORIDE 10 MEQ/100ML IV SOLN
10.0000 meq | Freq: Once | INTRAVENOUS | Status: AC
  Administered 2015-09-17: 10 meq via INTRAVENOUS
  Filled 2015-09-17: qty 100

## 2015-09-17 NOTE — Progress Notes (Signed)
ANTICOAGULATION CONSULT NOTE - Initial Consult  Pharmacy Consult for heparin  Indication: chest pain/ACS  No Known Allergies  Patient Measurements: Height: 5\' 2"  (157.5 cm) Weight: 163 lb (73.936 kg) IBW/kg (Calculated) : 50.1   Vital Signs: Temp: 98.4 F (36.9 C) (06/25 2128) Temp Source: Oral (06/25 2128) BP: 206/109 mmHg (06/25 2128) Pulse Rate: 75 (06/25 2128)  Labs: No results for input(s): HGB, HCT, PLT, APTT, LABPROT, INR, HEPARINUNFRC, HEPRLOWMOCWT, CREATININE, CKTOTAL, CKMB, TROPONINI in the last 72 hours.  CrCl cannot be calculated (Patient has no serum creatinine result on file.).   Medical History: Past Medical History  Diagnosis Date  . Asthma   . Hypertension   . Bell's palsy   . GERD (gastroesophageal reflux disease)   . 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 Cardiomegaly     Assessment: [name] admitted with CP and concern for ACS. Pharmacy consulted to dose heparin. No known  anticoagulation PTA. CBC pending.   Goal of Therapy:  Heparin level 0.3-0.7 units/ml Monitor platelets by anticoagulation protocol: Yes   Plan:  1. Give 4000 units bolus x 1 2. Start heparin infusion at 900 units/hr 3. Check anti-Xa level in 6 hours and daily while on heparin 4. Continue to monitor H&H and platelets  Vincenza Hews, PharmD, BCPS 09/17/2015, 10:16 PM Pager: [phone]

## 2015-09-17 NOTE — ED Notes (Signed)
Pt is currently pain free, however, she did have one episode of chest pain while RN in the room. EKG obtained during episode which has now passed.

## 2015-09-17 NOTE — ED Provider Notes (Signed)
CSN: [id number]     Arrival date & time 09/17/15  2109 History   First MD Initiated Contact with Patient 09/17/15 2134     Chief Complaint  Patient presents with  . Chest Pain     (Consider location/radiation/quality/duration/timing/severity/associated sxs/prior Treatment) HPI  45 year old female with chest pain. Gradual onset around 8 PM this evening. Pain in the center of her chest with radiation into her left upper extremity. Pain lasts up to a couple minutes. Waxes and wanes. No appreciable exacerbating relieving factors. Associated with mild nausea and diaphoresis when first began. She is also complaining of a pounding headache. She has a family history of coronary artery disease but no personal history that she is aware of. She has a history of renal artery stenosis status post stenting last year for poorly controlled hypertension. Brought in by EMS. Received 324 mg of aspirin prior to arrival. She tried taking nitroglycerin at home without much relief. Initial blood pressure reportedly 250/140.   Past Medical History  Diagnosis Date  . Asthma   . Hypertension   . Bell's palsy   . GERD (gastroesophageal reflux disease)   . 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 Cardiomegaly    Past Surgical History  Procedure Laterality Date  . Cesarean section  [place]    x2  . Skin biopsy  2000  . Renal artery stent Left 08/23/2014  . Peripheral vascular catheterization N/A 08/23/2014    Procedure: Renal Angiography;  Surgeon: Adrian Prows, MD;  Location: [facility];  Service: Cardiovascular;  Laterality: N/A;   Family History  Problem Relation Age of Onset  . Migraines Neg Hx   . Hypertension Mother   . Cancer Mother   . Heart defect Father   . Asthma Father    Social History  Substance Use Topics  . Smoking status: Current Every Day Smoker -- 0.25 packs/day for 15 years    Types: Cigarettes  . Smokeless tobacco: Never Used     Comment: 6 cig per day  . Alcohol Use:  No   OB History    No data available     Review of Systems  All systems reviewed and negative, other than as noted in HPI.   Allergies  Review of patient's allergies indicates no known allergies.  Home Medications   Prior to Admission medications   Medication Sig Start Date End Date Taking? Authorizing Provider  albuterol (PROVENTIL HFA;VENTOLIN HFA) 108 (90 BASE) MCG/ACT inhaler Inhale 2 puffs into the lungs every 4 (four) hours as needed. As needed for shortness of breath/asthma.   Yes Historical Provider, MD  amLODipine (NORVASC) 10 MG tablet Take 10 mg by mouth daily.   Yes Historical Provider, MD  aspirin 81 MG chewable tablet Chew 81 mg by mouth every morning.    Yes Historical Provider, MD  clopidogrel (PLAVIX) 75 MG tablet Take 1 tablet (75 mg total) by mouth daily with breakfast. 08/24/14  Yes Neldon Labella, NP  losartan (COZAAR) 50 MG tablet Take 50 mg by mouth daily. 06/28/15  Yes Historical Provider, MD  metoprolol succinate (TOPROL-XL) 50 MG 24 hr tablet Take 50 mg by mouth daily. 07/28/14  Yes Historical Provider, MD  mometasone-formoterol (DULERA) 200-5 MCG/ACT AERO Inhale 1 puff into the lungs 2 (two) times daily.   Yes Historical Provider, MD  montelukast (SINGULAIR) 10 MG tablet Take 10 mg by mouth at bedtime.   Yes Historical Provider, MD  nitroGLYCERIN (NITROSTAT) 0.4 MG SL  tablet Place 0.4 mg under the tongue every 5 (five) minutes as needed for chest pain.   Yes Historical Provider, MD   BP 206/109 mmHg  Pulse 75  Temp(Src) 98.4 F (36.9 C) (Oral)  Resp 21  Ht 5\' 2"  (1.575 m)  Wt 163 lb (73.936 kg)  BMI 29.81 kg/m2  SpO2 99% Physical Exam  ED Course  Procedures (including critical care time)  CRITICAL CARE Performed by: Virgel Manifold Total critical care time: 35 minutes Critical care time was exclusive of separately billable procedures and treating other patients. Critical care was necessary to treat or prevent imminent or life-threatening  deterioration. Critical care was time spent personally by me on the following activities: development of treatment plan with patient and/or surrogate as well as nursing, discussions with consultants, evaluation of patient's response to treatment, examination of patient, obtaining history from patient or surrogate, ordering and performing treatments and interventions, ordering and review of laboratory studies, ordering and review of radiographic studies, pulse oximetry and re-evaluation of patient's condition.  Labs Review Labs Reviewed  CBC WITH DIFFERENTIAL/PLATELET - Abnormal; Notable for the following:    WBC 12.5 (*)    All other components within normal limits  BASIC METABOLIC PANEL - Abnormal; Notable for the following:    Potassium 3.0 (*)    Glucose, Bld 108 (*)    All other components within normal limits  TROPONIN I - Abnormal; Notable for the following:    Troponin I 0.13 (*)    All other components within normal limits  HEPARIN LEVEL (UNFRACTIONATED)    Imaging Review No results found. I have personally reviewed and evaluated these images and lab results as part of my medical decision-making.   EKG Interpretation   Date/Time:  Sunday September 17 2015 21:20:26 EDT Ventricular Rate:  76 PR Interval:    QRS Duration: 98 QT Interval:  413 QTC Calculation: 465 R Axis:   70 Text Interpretation:  Sinus rhythm Abnormal T, probable ischemia, anterior  leads Confirmed by Hagar Sadiq  MD, Atianna Haidar ([id number]) on 09/17/2015 9:46:11 PM      MDM   Final diagnoses:  Hypertensive urgency  NSTEMI (non-ST elevated myocardial infarction) [facility])   45 year old female with chest pain. High concern for ACS. Intermittent chest pain which has been waxing and waning. Radiation to her left upper extremity. She has an abnormal EKG with deep biphasic T waves in V2 and T-wave inversions V3 through V5. Concerning for possible critical LAD lesion (Wellen's) versus strain from LVH. This could also be  hypertensive emergency. She already received aspirin. She'll be started on a nitroglycerin drip for both her pain and blood pressure management. Will start heparin empirically. Labs and x-ray are pending. Cardiology was consulted.  10:59 PM BP improved, but systolics still in [id number]. Feeling better. Continue to titrate meds. Actually a patient of Dr Irven Shelling who has seen previously for renal artery stent last June.   11:30 PM Discussed with Dr Einar Gip. She was actually dismissed from her practice after not following up several times.   Virgel Manifold, MD 09/21/15 1255

## 2015-09-17 NOTE — ED Notes (Signed)
Pt arrives by [facility] with c/o sudden onset central chest pain in sharp waves lasting less than 1 minute, pain radiated to left arm and pt also had associated "pounding" headache. Pt has hx of enlarged heart and has had chest pain before, but states it has never been like this before. BP on arrival 250/140, last BP 2013/111. Pt took nitro at home with no relief. EMS gave 324 aspirin. 20g in right FA. Pt pain free on arrival to ED.

## 2015-09-18 ENCOUNTER — Encounter (HOSPITAL_COMMUNITY)
Admission: EM | Disposition: A | Discharge status: Home / Self Care | Payer: Self-pay | Source: Home / Self Care | Attending: Family Medicine

## 2015-09-18 ENCOUNTER — Encounter (HOSPITAL_COMMUNITY): Payer: Self-pay | Admitting: Family Medicine

## 2015-09-18 ENCOUNTER — Inpatient Hospital Stay (HOSPITAL_COMMUNITY): Payer: Medicaid Other

## 2015-09-18 DIAGNOSIS — Z9119 Patient's noncompliance with other medical treatment and regimen: Secondary | ICD-10-CM | POA: Diagnosis not present

## 2015-09-18 DIAGNOSIS — Z8572 Personal history of non-Hodgkin lymphomas: Secondary | ICD-10-CM | POA: Diagnosis not present

## 2015-09-18 DIAGNOSIS — R079 Chest pain, unspecified: Secondary | ICD-10-CM | POA: Diagnosis present

## 2015-09-18 DIAGNOSIS — J45909 Unspecified asthma, uncomplicated: Secondary | ICD-10-CM | POA: Diagnosis present

## 2015-09-18 DIAGNOSIS — J4540 Moderate persistent asthma, uncomplicated: Secondary | ICD-10-CM

## 2015-09-18 DIAGNOSIS — Z7902 Long term (current) use of antithrombotics/antiplatelets: Secondary | ICD-10-CM | POA: Diagnosis not present

## 2015-09-18 DIAGNOSIS — Z7982 Long term (current) use of aspirin: Secondary | ICD-10-CM | POA: Diagnosis not present

## 2015-09-18 DIAGNOSIS — F17210 Nicotine dependence, cigarettes, uncomplicated: Secondary | ICD-10-CM | POA: Diagnosis present

## 2015-09-18 DIAGNOSIS — E876 Hypokalemia: Secondary | ICD-10-CM | POA: Diagnosis present

## 2015-09-18 DIAGNOSIS — K219 Gastro-esophageal reflux disease without esophagitis: Secondary | ICD-10-CM | POA: Diagnosis present

## 2015-09-18 DIAGNOSIS — I161 Hypertensive emergency: Secondary | ICD-10-CM | POA: Diagnosis present

## 2015-09-18 DIAGNOSIS — I2510 Atherosclerotic heart disease of native coronary artery without angina pectoris: Secondary | ICD-10-CM

## 2015-09-18 DIAGNOSIS — I10 Essential (primary) hypertension: Secondary | ICD-10-CM | POA: Diagnosis present

## 2015-09-18 DIAGNOSIS — I160 Hypertensive urgency: Secondary | ICD-10-CM | POA: Diagnosis present

## 2015-09-18 DIAGNOSIS — I081 Rheumatic disorders of both mitral and tricuspid valves: Secondary | ICD-10-CM | POA: Diagnosis present

## 2015-09-18 DIAGNOSIS — G4733 Obstructive sleep apnea (adult) (pediatric): Secondary | ICD-10-CM | POA: Diagnosis present

## 2015-09-18 DIAGNOSIS — Z8249 Family history of ischemic heart disease and other diseases of the circulatory system: Secondary | ICD-10-CM | POA: Diagnosis not present

## 2015-09-18 DIAGNOSIS — E785 Hyperlipidemia, unspecified: Secondary | ICD-10-CM | POA: Diagnosis present

## 2015-09-18 DIAGNOSIS — I214 Non-ST elevation (NSTEMI) myocardial infarction: Principal | ICD-10-CM

## 2015-09-18 DIAGNOSIS — Z79899 Other long term (current) drug therapy: Secondary | ICD-10-CM | POA: Diagnosis not present

## 2015-09-18 DIAGNOSIS — Z8579 Personal history of other malignant neoplasms of lymphoid, hematopoietic and related tissues: Secondary | ICD-10-CM | POA: Diagnosis not present

## 2015-09-18 DIAGNOSIS — R609 Edema, unspecified: Secondary | ICD-10-CM | POA: Diagnosis not present

## 2015-09-18 DIAGNOSIS — J9811 Atelectasis: Secondary | ICD-10-CM | POA: Diagnosis present

## 2015-09-18 DIAGNOSIS — G510 Bell's palsy: Secondary | ICD-10-CM | POA: Diagnosis present

## 2015-09-18 HISTORY — PX: CARDIAC CATHETERIZATION: SHX172

## 2015-09-18 LAB — ECHOCARDIOGRAM COMPLETE
CHL CUP MV DEC (S): 239
CHL CUP TV REG PEAK VELOCITY: 293 cm/s
EWDT: 239 ms
FS: 24 % — AB (ref 28–44)
Height: 63 in
IV/PV OW: 0.81
LA ID, A-P, ES: 36 mm
LADIAMINDEX: 2.01 cm/m2
LAVOLA4C: 47 mL
LEFT ATRIUM END SYS DIAM: 36 mm
LVOT area: 3.14 cm2
LVOTD: 20 mm
MV Annulus VTI: 37.8 cm
MV M vel: 59.2
MV VTI: 232 cm
MVAP: 1.8 cm2
MVPG: 3 mmHg
MVPKAVEL: 74.7 m/s
MVPKEVEL: 82.5 m/s
Mean grad: 2 mmHg
P 1/2 time: 122 ms
PW: 13.9 mm — AB (ref 0.6–1.1)
RV TAPSE: 23 mm
TR max vel: 293 cm/s
Weight: 2659.63 oz

## 2015-09-18 LAB — MAGNESIUM: MAGNESIUM: 1.9 mg/dL (ref 1.7–2.4)

## 2015-09-18 LAB — TROPONIN I
TROPONIN I: 1.57 ng/mL — AB (ref <0.031)
TROPONIN I: 1.45 ng/mL — AB (ref <0.031)
Troponin I: 0.94 ng/mL (ref <0.031)

## 2015-09-18 LAB — RAPID URINE DRUG SCREEN, HOSP PERFORMED
Amphetamines: NOT DETECTED
BARBITURATES: NOT DETECTED
Benzodiazepines: NOT DETECTED
Cocaine: NOT DETECTED
Opiates: NOT DETECTED
Tetrahydrocannabinol: NOT DETECTED

## 2015-09-18 LAB — PROTIME-INR
INR: 1.16 (ref 0.00–1.49)
PROTHROMBIN TIME: 14.9 s (ref 11.6–15.2)

## 2015-09-18 LAB — URINALYSIS, ROUTINE W REFLEX MICROSCOPIC
BILIRUBIN URINE: NEGATIVE
GLUCOSE, UA: NEGATIVE mg/dL
HGB URINE DIPSTICK: NEGATIVE
KETONES UR: NEGATIVE mg/dL
Leukocytes, UA: NEGATIVE
Nitrite: NEGATIVE
PH: 6.5 (ref 5.0–8.0)
PROTEIN: NEGATIVE mg/dL
Specific Gravity, Urine: 1.011 (ref 1.005–1.030)

## 2015-09-18 LAB — LIPID PANEL
CHOL/HDL RATIO: 3.9 ratio
Cholesterol: 183 mg/dL (ref 0–200)
HDL: 47 mg/dL (ref >40)
LDL CALC: 111 mg/dL — AB (ref 0–99)
Triglycerides: 123 mg/dL (ref <150)
VLDL: 25 mg/dL (ref 0–40)

## 2015-09-18 LAB — TSH: TSH: 1.192 u[IU]/mL (ref 0.350–4.500)

## 2015-09-18 LAB — BASIC METABOLIC PANEL
ANION GAP: 7 (ref 5–15)
BUN: 7 mg/dL (ref 6–20)
CHLORIDE: 111 mmol/L (ref 101–111)
CO2: 21 mmol/L — ABNORMAL LOW (ref 22–32)
Calcium: 9 mg/dL (ref 8.9–10.3)
Creatinine, Ser: 0.65 mg/dL (ref 0.44–1.00)
GFR calc Af Amer: >60 mL/min (ref >60)
GLUCOSE: 99 mg/dL (ref 65–99)
POTASSIUM: 4.1 mmol/L (ref 3.5–5.1)
Sodium: 139 mmol/L (ref 135–145)

## 2015-09-18 LAB — BRAIN NATRIURETIC PEPTIDE: B Natriuretic Peptide: 107.2 pg/mL — ABNORMAL HIGH (ref 0.0–100.0)

## 2015-09-18 LAB — HEPARIN LEVEL (UNFRACTIONATED)
HEPARIN UNFRACTIONATED: 0.32 [IU]/mL (ref 0.30–0.70)
Heparin Unfractionated: 0.23 IU/mL — ABNORMAL LOW (ref 0.30–0.70)

## 2015-09-18 LAB — MRSA PCR SCREENING: MRSA BY PCR: NEGATIVE

## 2015-09-18 SURGERY — LEFT HEART CATH AND CORONARY ANGIOGRAPHY

## 2015-09-18 MED ORDER — ATORVASTATIN CALCIUM 40 MG PO TABS: 40.0000 mg | ORAL_TABLET | Freq: Every day | ORAL | Status: DC

## 2015-09-18 MED ORDER — MIDAZOLAM HCL 2 MG/2ML IJ SOLN
INTRAMUSCULAR | Status: AC
  Filled 2015-09-18: qty 2

## 2015-09-18 MED ORDER — ALBUTEROL SULFATE (2.5 MG/3ML) 0.083% IN NEBU: 3.0000 mL | INHALATION_SOLUTION | RESPIRATORY_TRACT | Status: DC | PRN

## 2015-09-18 MED ORDER — ATORVASTATIN CALCIUM 80 MG PO TABS
80.0000 mg | ORAL_TABLET | Freq: Every day | ORAL | Status: DC
  Administered 2015-09-18 – 2015-09-19 (×2): 80 mg via ORAL
  Filled 2015-09-18 (×2): qty 1

## 2015-09-18 MED ORDER — CLOPIDOGREL BISULFATE 75 MG PO TABS
75.0000 mg | ORAL_TABLET | Freq: Every day | ORAL | Status: DC
Start: 2015-09-18 — End: 2015-09-20
  Administered 2015-09-18 – 2015-09-20 (×3): 75 mg via ORAL
  Filled 2015-09-18 (×3): qty 1

## 2015-09-18 MED ORDER — SODIUM CHLORIDE 0.9 % IV SOLN
INTRAVENOUS | Status: DC
  Administered 2015-09-18: 10:00:00 via INTRAVENOUS

## 2015-09-18 MED ORDER — VERAPAMIL HCL 2.5 MG/ML IV SOLN
INTRAVENOUS | Status: AC
  Filled 2015-09-18: qty 2

## 2015-09-18 MED ORDER — HEPARIN (PORCINE) IN NACL 2-0.9 UNIT/ML-% IJ SOLN
INTRAMUSCULAR | Status: AC
  Filled 2015-09-18: qty 1000

## 2015-09-18 MED ORDER — IOPAMIDOL (ISOVUE-370) INJECTION 76%
INTRAVENOUS | Status: AC
  Filled 2015-09-18: qty 50

## 2015-09-18 MED ORDER — VERAPAMIL HCL 2.5 MG/ML IV SOLN
INTRAVENOUS | Status: DC | PRN
  Administered 2015-09-18: 10 mL via INTRA_ARTERIAL

## 2015-09-18 MED ORDER — MORPHINE SULFATE (PF) 10 MG/ML IV SOLN
INTRAVENOUS | Status: AC
  Filled 2015-09-18: qty 1

## 2015-09-18 MED ORDER — HEPARIN (PORCINE) IN NACL 100-0.45 UNIT/ML-% IJ SOLN
1150.0000 [IU]/h | INTRAMUSCULAR | Status: DC
  Administered 2015-09-19: 1050 [IU]/h via INTRAVENOUS
  Filled 2015-09-18: qty 250

## 2015-09-18 MED ORDER — LIDOCAINE HCL (PF) 1 % IJ SOLN
INTRAMUSCULAR | Status: DC | PRN
  Administered 2015-09-18: 3 mL

## 2015-09-18 MED ORDER — LOSARTAN POTASSIUM 50 MG PO TABS
50.0000 mg | ORAL_TABLET | Freq: Every day | ORAL | Status: DC
  Administered 2015-09-18 – 2015-09-20 (×3): 50 mg via ORAL
  Filled 2015-09-18 (×3): qty 1

## 2015-09-18 MED ORDER — SODIUM CHLORIDE 0.9 % WEIGHT BASED INFUSION
3.0000 mL/kg/h | INTRAVENOUS | Status: AC
  Administered 2015-09-18: 3 mL/kg/h via INTRAVENOUS

## 2015-09-18 MED ORDER — SODIUM CHLORIDE 0.9% FLUSH: 3.0000 mL | INTRAVENOUS | Status: DC | PRN

## 2015-09-18 MED ORDER — HEPARIN SODIUM (PORCINE) 1000 UNIT/ML IJ SOLN
INTRAMUSCULAR | Status: AC
  Filled 2015-09-18: qty 1

## 2015-09-18 MED ORDER — SODIUM CHLORIDE 0.9% FLUSH: 3.0000 mL | Freq: Two times a day (BID) | INTRAVENOUS | Status: DC

## 2015-09-18 MED ORDER — HEPARIN (PORCINE) IN NACL 2-0.9 UNIT/ML-% IJ SOLN
INTRAMUSCULAR | Status: DC | PRN
  Administered 2015-09-18: 1500 mL

## 2015-09-18 MED ORDER — MORPHINE SULFATE (PF) 10 MG/ML IV SOLN
INTRAVENOUS | Status: DC | PRN
  Administered 2015-09-18: 2 mg via INTRAVENOUS

## 2015-09-18 MED ORDER — METOPROLOL SUCCINATE ER 25 MG PO TB24: 50.0000 mg | ORAL_TABLET | Freq: Every day | ORAL | Status: DC

## 2015-09-18 MED ORDER — IOPAMIDOL (ISOVUE-370) INJECTION 76%
INTRAVENOUS | Status: DC | PRN
  Administered 2015-09-18: 100 mL via INTRAVENOUS

## 2015-09-18 MED ORDER — METOPROLOL SUCCINATE ER 50 MG PO TB24
75.0000 mg | ORAL_TABLET | Freq: Every day | ORAL | Status: DC
  Administered 2015-09-19: 75 mg via ORAL
  Filled 2015-09-18: qty 1

## 2015-09-18 MED ORDER — MOMETASONE FURO-FORMOTEROL FUM 200-5 MCG/ACT IN AERO
1.0000 | INHALATION_SPRAY | Freq: Two times a day (BID) | RESPIRATORY_TRACT | Status: DC
  Administered 2015-09-18 – 2015-09-20 (×3): 1 via RESPIRATORY_TRACT
  Filled 2015-09-18 (×2): qty 8.8

## 2015-09-18 MED ORDER — MIDAZOLAM HCL 2 MG/2ML IJ SOLN
INTRAMUSCULAR | Status: DC | PRN
  Administered 2015-09-18: 2 mg via INTRAVENOUS

## 2015-09-18 MED ORDER — ASPIRIN 81 MG PO CHEW
81.0000 mg | CHEWABLE_TABLET | ORAL | Status: DC
  Administered 2015-09-19 – 2015-09-20 (×2): 81 mg via ORAL
  Filled 2015-09-18 (×3): qty 1

## 2015-09-18 MED ORDER — ACETAMINOPHEN 325 MG PO TABS
650.0000 mg | ORAL_TABLET | ORAL | Status: DC | PRN
  Administered 2015-09-18: 650 mg via ORAL
  Filled 2015-09-18 (×2): qty 2

## 2015-09-18 MED ORDER — NITROGLYCERIN 1 MG/10 ML FOR IR/CATH LAB
INTRA_ARTERIAL | Status: DC | PRN
  Administered 2015-09-18: 200 ug via INTRACORONARY

## 2015-09-18 MED ORDER — SODIUM CHLORIDE 0.9 % IV SOLN: 250.0000 mL | INTRAVENOUS | Status: DC | PRN

## 2015-09-18 MED ORDER — SODIUM CHLORIDE 0.9% FLUSH
3.0000 mL | Freq: Two times a day (BID) | INTRAVENOUS | Status: DC
  Administered 2015-09-18: 3 mL via INTRAVENOUS

## 2015-09-18 MED ORDER — FENTANYL CITRATE (PF) 100 MCG/2ML IJ SOLN
INTRAMUSCULAR | Status: DC | PRN
  Administered 2015-09-18 (×2): 25 ug via INTRAVENOUS

## 2015-09-18 MED ORDER — HEPARIN (PORCINE) IN NACL 2-0.9 UNIT/ML-% IJ SOLN
INTRAMUSCULAR | Status: AC
Start: 2015-09-18 — End: 2015-09-18
  Filled 2015-09-18: qty 500

## 2015-09-18 MED ORDER — ASPIRIN 81 MG PO CHEW
81.0000 mg | CHEWABLE_TABLET | ORAL | Status: AC
  Administered 2015-09-18: 81 mg via ORAL

## 2015-09-18 MED ORDER — MONTELUKAST SODIUM 10 MG PO TABS
10.0000 mg | ORAL_TABLET | Freq: Every day | ORAL | Status: DC
  Administered 2015-09-18 – 2015-09-19 (×2): 10 mg via ORAL
  Filled 2015-09-18 (×2): qty 1

## 2015-09-18 MED ORDER — AMLODIPINE BESYLATE 10 MG PO TABS
10.0000 mg | ORAL_TABLET | Freq: Every day | ORAL | Status: DC
  Administered 2015-09-18 – 2015-09-20 (×3): 10 mg via ORAL
  Filled 2015-09-18 (×2): qty 1
  Filled 2015-09-18: qty 2

## 2015-09-18 MED ORDER — LIDOCAINE HCL (PF) 1 % IJ SOLN
INTRAMUSCULAR | Status: AC
  Filled 2015-09-18: qty 30

## 2015-09-18 MED ORDER — ONDANSETRON HCL 4 MG/2ML IJ SOLN
4.0000 mg | Freq: Four times a day (QID) | INTRAMUSCULAR | Status: DC | PRN
  Administered 2015-09-18 (×2): 4 mg via INTRAVENOUS
  Filled 2015-09-18 (×2): qty 2

## 2015-09-18 MED ORDER — HYDRALAZINE HCL 20 MG/ML IJ SOLN: 10.0000 mg | INTRAMUSCULAR | Status: DC | PRN

## 2015-09-18 MED ORDER — IOPAMIDOL (ISOVUE-370) INJECTION 76%
INTRAVENOUS | Status: AC
  Filled 2015-09-18: qty 100

## 2015-09-18 MED ORDER — NITROGLYCERIN IN D5W 200-5 MCG/ML-% IV SOLN: 0.0000 ug/min | INTRAVENOUS | Status: DC

## 2015-09-18 MED ORDER — FENTANYL CITRATE (PF) 100 MCG/2ML IJ SOLN
INTRAMUSCULAR | Status: AC
  Filled 2015-09-18: qty 2

## 2015-09-18 MED ORDER — NITROGLYCERIN 0.4 MG SL SUBL: 0.4000 mg | SUBLINGUAL_TABLET | SUBLINGUAL | Status: DC | PRN

## 2015-09-18 MED ORDER — ZOLPIDEM TARTRATE 5 MG PO TABS: 5.0000 mg | ORAL_TABLET | Freq: Every evening | ORAL | Status: DC | PRN

## 2015-09-18 MED ORDER — HEPARIN SODIUM (PORCINE) 1000 UNIT/ML IJ SOLN
INTRAMUSCULAR | Status: DC | PRN
  Administered 2015-09-18: 3500 [IU] via INTRAVENOUS

## 2015-09-18 SURGICAL SUPPLY — 12 items
CATH INFINITI 5 FR JL3.5 (CATHETERS) ×3 IMPLANT
CATH INFINITI 5FR ANG PIGTAIL (CATHETERS) ×3 IMPLANT
CATH INFINITI JR4 5F (CATHETERS) ×3 IMPLANT
CATH OPTITORQUE TIG 4.0 5F (CATHETERS) ×3 IMPLANT
DEVICE RAD COMP TR BAND LRG (VASCULAR PRODUCTS) ×3 IMPLANT
GLIDESHEATH SLEND A-KIT 6F 22G (SHEATH) ×3 IMPLANT
KIT HEART LEFT (KITS) ×3 IMPLANT
PACK CARDIAC CATHETERIZATION (CUSTOM PROCEDURE TRAY) ×3 IMPLANT
SYR MEDRAD MARK V 150ML (SYRINGE) ×3 IMPLANT
TRANSDUCER W/STOPCOCK (MISCELLANEOUS) ×3 IMPLANT
TUBING CIL FLEX 10 FLL-RA (TUBING) ×3 IMPLANT
WIRE SAFE-T 1.5MM-J .035X260CM (WIRE) ×3 IMPLANT

## 2015-09-18 NOTE — Consult Note (Signed)
CARDIOLOGY CONSULT NOTE   Patient ID: [name] MRN: [id number] DOB/AGE: 1971/02/17 45 y.o.  Admit date: 09/17/2015  Primary Physician   Pcp Not In System Primary Cardiologist   Dr. Einar Gip previously (want to followed by [facility]) Reason for Consultation   Chest pain/NSTEMI Requesting Physician  Dr. Wynetta Emery  HPI: [name] is a 45 y.o. female with a history of difficult to control HTN, right sided Bell's palsy in 2001 with residual right-sided facial weakness,  lymphoma of neck, cardiomegaly who came in by EMS for chest pain.   Previously followed by Dr. Einar Gip for difficult to control blood pressure on multiple regimens. Renal duplex showed renal artery stenosis s/p successful stent placement to the left renal artery with reduction in stenosis from 80% to 0%. 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 Einar Gip.   Yesterday while watching TV patient has developed a felt left-sided chest pain. He described the pain as a pressure/sharp. It radiated to her left arm and neck associated with shortness of breath. The pain lasted for approximately 20-30 minutes. She took SL nitroglycerin without improvement and EMS was called. Upon arrival her blood pressure was 250/140. Given IV metoprolol and started on IV nitroglycerin. EKG shows T-wave inversion in inferior lead and lead V3 and V4. Troponin of 0.13 --> 1.57--> 0.45. Potassium was low which returned to normal with supplement. LDL 111. TSH normal. Urine drug test negative. Chest x-ray suggestive of asthma. Currently on IV heparin. Having headache on IV nitroglycerin. Recently having exertional chest pain which resolved with the rest. She denies orthopnea, PND, syncope, lower extremity edema, nausea, vomiting, melena or blood in her stool or urine.  She smokes one fourth pack a day for the past 20 years. Dad had a CABG at age 110 and then MI at  age 37.  Past Medical History  Diagnosis Date  . Asthma   . Hypertension   . Bell's palsy   . GERD (gastroesophageal reflux disease)   . 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 Cardiomegaly      Past Surgical History  Procedure Laterality Date  . Cesarean section  [place]    x2  . Skin biopsy  2000  . Renal artery stent Left 08/23/2014  . Peripheral vascular catheterization N/A 08/23/2014    Procedure: Renal Angiography;  Surgeon: Adrian Prows, MD;  Location: [facility];  Service: Cardiovascular;  Laterality: N/A;    No Known Allergies  I have reviewed the patient's current medications . amLODipine  10 mg Oral Daily  . aspirin  81 mg Oral [id number]  . atorvastatin  40 mg Oral q1800  . clopidogrel  75 mg Oral Daily  . losartan  50 mg Oral Daily  . metoprolol succinate  50 mg Oral Daily  . mometasone-formoterol  1 puff Inhalation BID  . montelukast  10 mg Oral QHS  . sodium chloride flush  3 mL Intravenous Q12H   . heparin 1,050 Units/hr (09/18/15 0600)  . nitroGLYCERIN 110 mcg/min (09/18/15 0600)   sodium chloride, acetaminophen, albuterol, nitroGLYCERIN, ondansetron (ZOFRAN) IV, sodium chloride flush, zolpidem  Prior to Admission medications   Medication Sig Start Date End Date Taking? Authorizing Provider  albuterol (PROVENTIL HFA;VENTOLIN HFA) 108 (90 BASE) MCG/ACT inhaler Inhale 2 puffs into the lungs every 4 (four) hours as needed. As needed for shortness of breath/asthma.   Yes Historical Provider, MD  amLODipine (NORVASC) 10 MG tablet Take 10 mg by mouth daily.   Yes Historical Provider, MD  aspirin 81 MG chewable tablet Chew 81 mg by mouth every morning.    Yes Historical Provider, MD  clopidogrel (PLAVIX) 75 MG tablet Take 1 tablet (75 mg total) by mouth daily with breakfast. 08/24/14  Yes Neldon Labella, NP  losartan (COZAAR) 50 MG tablet Take 50 mg by mouth daily. 06/28/15  Yes Historical Provider, MD  metoprolol succinate (TOPROL-XL) 50 MG 24 hr tablet  Take 50 mg by mouth daily. 07/28/14  Yes Historical Provider, MD  mometasone-formoterol (DULERA) 200-5 MCG/ACT AERO Inhale 1 puff into the lungs 2 (two) times daily.   Yes Historical Provider, MD  montelukast (SINGULAIR) 10 MG tablet Take 10 mg by mouth at bedtime.   Yes Historical Provider, MD  nitroGLYCERIN (NITROSTAT) 0.4 MG SL tablet Place 0.4 mg under the tongue every 5 (five) minutes as needed for chest pain.   Yes Historical Provider, MD     Social History   Social History  . Marital Status: Single    Spouse Name: N/A  . Number of Children: 4  . Years of Education: 9   Occupational History  . Unemployed    Social History Main Topics  . Smoking status: Current Every Day Smoker -- 0.25 packs/day for 15 years    Types: Cigarettes  . Smokeless tobacco: Never Used     Comment: 6 cig per day  . Alcohol Use: No  . Drug Use: No  . Sexual Activity: Not on file   Other Topics Concern  . Not on file   Social History Narrative   Lives at home with fiance and kids   Caffeine use: 2 cups coffee per day   4 Dr. Malachi Bonds (12oz) per day     Family Status  Relation Status Death Age  . Mother Deceased   . Father Deceased    Family History  Problem Relation Age of Onset  . Migraines Neg Hx   . Hypertension Mother   . Cancer Mother   . Heart defect Father   . Asthma Father        ROS:  Full 14 point review of systems complete and found to be negative unless listed above.  Physical Exam: Blood pressure 157/93, pulse 66, temperature 98.4 F (36.9 C), temperature source Oral, resp. rate 22, height 5\' 3"  (1.6 m), weight 166 lb 3.6 oz (75.4 kg), SpO2 96 %.  General: Well developed, well nourished, female appears in discomfort due to headache.  Head: Eyes PERRLA, No xanthomas. Normocephalic and atraumatic, oropharynx without edema or exudate.  Lungs: Resp regular and unlabored, CTA. Heart: RRR no s3, s4, or murmurs..   Neck: No carotid bruits. No lymphadenopathy.  JVD. Abdomen:  Bowel sounds present, abdomen soft and non-tender without masses or hernias noted. Msk:  No spine or cva tenderness. No weakness, no joint deformities or effusions. Extremities: No clubbing, cyanosis or edema. DP/PT/Radials 2+ and equal bilaterally. Neuro: Alert and oriented X 3. No focal deficits noted. Psych:  Good affect, responds appropriately Skin: No rashes or lesions noted.  Labs:   Lab Results  Component Value Date   WBC 12.5* 09/17/2015   HGB 14.5 09/17/2015   HCT 41.4 09/17/2015   MCV 87.9 09/17/2015   PLT 251 09/17/2015   No results for input(s): INR in the last 72 hours.  Recent Labs Lab 09/18/15 0340  NA 139  K 4.1  CL 111  CO2 21*  BUN 7  CREATININE 0.65  CALCIUM 9.0  GLUCOSE 99   MAGNESIUM  Date Value Ref Range Status  09/18/2015 1.9 1.7 - 2.4 mg/dL Final    Recent Labs  09/17/15 2208 09/18/15 0340 09/18/15 0544  TROPONINI 0.13* 1.57* 1.45*   No results for input(s): TROPIPOC in the last 72 hours. No results found for: PROBNP Lab Results  Component Value Date   CHOL 183 09/18/2015   HDL 47 09/18/2015   LDLCALC 111* 09/18/2015   TRIG 123 09/18/2015    Echo: penidng  ECG:  EKG shows sinus rhythm T-wave inversion in inferior lead and lead V3 and V4.  Vent. rate 76 BPM PR interval * ms QRS duration 98 ms QT/QTc 413/465 ms P-R-T axes 68 70 -48  Radiology:  Dg Chest Portable 1 View  09/17/2015  CLINICAL DATA:  Acute onset chest pain, pounding headache. History of asthma, lymphoma. EXAM: PORTABLE CHEST 1 VIEW COMPARISON:  Chest radiograph June 21, 2011 FINDINGS: Cardiomediastinal silhouette is normal. Increased lung volumes without pleural effusion or focal consolidation. Catheter in LEFT chest the subclavian venous approach with distal tip projecting in proximal superior vena cava unchanged. Strandy densities RIGHT lung base. No pneumothorax. Soft tissue planes included osseous structures are unchanged. IMPRESSION: RIGHT lung base atelectasis.  Increased lung volumes compatible with asthma. Electronically Signed   By: Elon Alas M.D.   On: 09/17/2015 23:10    ASSESSMENT AND PLAN:     1. NSTEMI (non-ST elevated myocardial infarction) (HCC) - EKG with new T-wave inversion in inferior lead and lead V3 to V4. Peak of troponin 1.57. In setting of hypokalemia and hypertensive urgency. Cardiac risk factor includes ongoing tobacco abuse, hypertension, strong family history. Her chest pain is concerning for unstable angina. Will discontinue IV nitroglycerin due to severe headache. We will try Nitropaste/patch if chest pain reoccurs. Continue IV heparin. Plan for cardiac catheterization for definite evaluation of coronary anatomy today. Consider radial artery for access given history of pseudoaneurysm of right femoral artery. Continue aspirin, Plavix, beta blocker and losartan.  -The patient understands that risks include but are not limited to stroke (1 in 1000), death (1 in 25), kidney failure [usually temporary] (1 in 500), bleeding (1 in 200), allergic reaction [possibly serious] (1 in 200), and agrees to proceed.   2. Hypertensive urgency - History of difficult to control blood pressure. States that she is compliant with home regimen. Continue Nolvadex 10 mg, losartan 50 mg. Will increase to Troprol XL 75 MG. Further titrate losartan as needed post cath.  3. Hx of renal artery stenosis - s/p L renal stent placement 07/2014. 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 - Continue ASA  And plavix  (for now - she is one year post stent placement)   4.  Hypokalemia - Resoled  5. History of lymphoma - Per primary  6. HL - Will increase Lipitor to 80 mg for possible CAD. Check LFT.    SignedLeanor Kail, [facility] 09/18/2015, 8:01 AM Pager [phone]  Co-Sign MD  Patient seen and examined. Agree with assessment and plan.  Ms. [name]  is a 45 year old African-American female who has a history of hypertension and is status post left renal artery stent placement and 2016.  Has a history of hyperlipidemia, lymphoma, and presented with hypertensive urgency associated with left-sided chest discomfort.  Her initial blood pressure was 250/140 and she was treated with IV metoprolol and IV nitroglycerin.  She has ruled in for non-ST segment elevation MI with troponin elevation .  Peak at 1.57.  She has a strong family history for CAD with her father undergoing CABG surgery in his 40s and subsequent MI in his 40s.  The patient has a history of tobacco use.  Her ECG has shown sinus rhythm at 82 bpm with LVH and precordial T-wave inversion consistent with ischemia.  She had increased QTc interval.  A 2-D echo Doppler study is currently being done at the bedside.  I reviewed this at the bedside with the patient.  This shows mild global LV dysfunction with an EF of 45-50 with LVH.  She has at least moderate mitral regurgitation with mild TR and PR.  Presently she's not having chest pain and is and on exam there is no chest wall tenderness.  She is now on amlodipine 10 mg, losartan 50 mg, metoprolol succinate 50 mg daily for blood pressure control in addition to IV nitroglycerin.  She is maintained on IV heparin.  She is on atorvastatin for hyperlipidemia and is on dual antiplatelet therapy with aspirin and Plavix.  It is my recommendation that definitive cardiac catheterization be performed. I have reviewed the risks, indications, and alternatives to cardiac catheterization, possible angioplasty, and stenting with the patient. Risks include but are not limited to bleeding, infection, vascular injury, stroke, myocardial infection, arrhythmia, kidney injury, radiation-related injury in the case of prolonged fluoroscopy use, emergency cardiac surgery, and death. The patient understands the risks of serious complication is 1-2 in [id number] with diagnostic cardiac cath  and 1-2% or less with angioplasty/stenting.  We will schedule the patient for  cardiac catheterization later today.  Troy Sine, MD, [facility] 09/18/2015 10:27 AM

## 2015-09-18 NOTE — Progress Notes (Signed)
*  PRELIMINARY RESULTS* Echocardiogram 2D Echocardiogram has been performed.  Leavy Cella 09/18/2015, 10:55 AM

## 2015-09-18 NOTE — Interval H&P Note (Signed)
History and Physical Interval Note:  09/18/2015 2:13 PM  Lalinda Khamvongsa  has presented today for surgery, with the diagnosis of non-STEMI The various methods of treatment have been discussed with the patient and family. After consideration of risks, benefits and other options for treatment, the patient has consented to  Procedure(s): Left Heart Cath and Coronary Angiography (N/A) with possible Percutaneous Coronary Intervention as a surgical intervention .  The patient's history has been reviewed, patient examined, no change in status, stable for surgery.  I have reviewed the patient's chart and labs.  Questions were answered to the patient's satisfaction.    Cath Lab Visit (complete for each Cath Lab visit)  Clinical Evaluation Leading to the Procedure:   ACS: Yes.    Non-ACS:    Anginal Classification: CCS III  Anti-ischemic medical therapy: Maximal Therapy (2 or more classes of medications)  Non-Invasive Test Results: No non-invasive testing performed  Prior CABG: No previous CABG  TIMI Score  Patient Information:  TIMI Score is 4   UA/NSTEMI and intermediate-risk features (e.g., TIMI score 3-4) for short-term risk of death or nonfatal MI  Revascularization of the presumed culprit artery   A (8)  Indication: 10; Score: 8   Kandace Blitz, MD

## 2015-09-18 NOTE — Progress Notes (Signed)
ANTICOAGULATION CONSULT NOTE - Follow Up Consult  Pharmacy Consult for Heparin  Indication: chest pain/ACS  No Known Allergies  Patient Measurements: Height: 5\' 3"  (160 cm) Weight: 166 lb 3.6 oz (75.4 kg) IBW/kg (Calculated) : 52.4  Vital Signs: Temp: 98.4 F (36.9 C) (06/25 2128) Temp Source: Oral (06/25 2128) BP: 176/97 mmHg (06/26 0430) Pulse Rate: 65 (06/26 0430)  Labs:  Recent Labs  09/17/15 2208 09/18/15 0340  HGB 14.5  --   HCT 41.4  --   PLT 251  --   HEPARINUNFRC  --  0.23*  CREATININE 0.77 0.65  TROPONINI 0.13* 1.57*    Estimated Creatinine Clearance: 88.2 mL/min (by C-G formula based on Cr of 0.65).   Assessment: Heparin for elevated troponin, initial heparin level is sub-therapeutic, no issues per RN  Goal of Therapy:  Heparin level 0.3-0.7 units/ml Monitor platelets by anticoagulation protocol: Yes   Plan:  -Increase heparin to 1050 units/hr -1200 HL  Narda Bonds 09/18/2015,5:06 AM

## 2015-09-18 NOTE — Progress Notes (Signed)
ANTICOAGULATION CONSULT NOTE - Follow Up Consult  Pharmacy Consult:  Heparin Indication: chest pain/ACS  No Known Allergies  Patient Measurements: Height: 5\' 3"  (160 cm) Weight: 166 lb 3.6 oz (75.4 kg) IBW/kg (Calculated) : 52.4 Heparin Dosing Weight: 68 kg  Vital Signs: Temp: 98.4 F (36.9 C) (06/26 1212) Temp Source: Oral (06/26 1212) BP: 169/97 mmHg (06/26 1454) Pulse Rate: 69 (06/26 1454)  Labs:  Recent Labs  09/17/15 2208 09/18/15 0340 09/18/15 0544 09/18/15 1231  HGB 14.5  --   --   --   HCT 41.4  --   --   --   PLT 251  --   --   --   LABPROT  --   --   --  14.9  INR  --   --   --  1.16  HEPARINUNFRC  --  0.23*  --  0.32  CREATININE 0.77 0.65  --   --   TROPONINI 0.13* 1.57* 1.45* 0.94*    Estimated Creatinine Clearance: 88.2 mL/min (by C-G formula based on Cr of 0.65).     Assessment: 20 YOF with NSTEMI to continue on IV heparin.  Heparin level at low therapeutic range; no bleeding reported.  Patient just left for cath.  PM f/u: found with LAD dx on cath, unable to stent.  Pharmacy asked to resume heparin 8 hrs after sheath pull for 48 hrs.  Sheath out at 1455 PM.  Goal of Therapy:  Heparin level 0.3-0.7 units/ml Monitor platelets by anticoagulation protocol: Yes    Plan:  - Resume heparin at 1050 units/hr at 2300 PM.  Scheduled to end in 48 hrs. - Check heparin level 6 hrs after gtt resumed.  - Daily heparin level and CBC.  Uvaldo Rising, BCPS  Clinical Pharmacist Pager [phone]  09/18/2015 3:40 PM

## 2015-09-18 NOTE — H&P (Signed)
History and Physical    [name] [id number] DOB: 1970-03-28 DOA: 09/17/2015  PCP: Pcp Not In System   Patient coming from: Home   Chief Complaint: Chest pain   HPI: [name] is a 45 y.o. female with medical history significant for hypertension, asthma, lymphoma in remission, and renal artery stenosis status post stent placement 1 year ago, and reported nonadherence to her treatment plan now presenting to the emergency department with acute onset of chest pain just prior to arrival. Patient reports pain in her usual state of health throughout the day today until the sudden onset of pain in her central chest with radiation to the left arm that began while at rest. Patient reports that the symptoms "come in waves," with pain lasting less than 1 minute each time. There is no associated dyspnea, nausea, or diaphoresis, but there is a mild headache. She cannot identify any alleviating or exacerbating factors. She denies any recent use of illicit substances and reports recent adherence to her medications. She reports similar symptoms approximately one month ago, though not nearly as severe. Patient took a nitroglycerin at home with no appreciable change in her symptoms. She activated EMS for transport to the hospital. Blood pressure was reportedly 250/140 on the scene and 324 mg aspirin chew was administered by EMS en route.  ED Course: Upon arrival to the ED, patient is found to be afebrile, saturating well on room air, hypertensive to 206/109, and with vitals otherwise stable. EKG demonstrates a sinus rhythm with diffuse T-wave flattening/inversions. Chest x-ray is notable for atelectasis in the right base. Initial troponin is elevated to a value of 0.13. BMP is notable for hypokalemia to 3.0. CBC features a leukocytosis of 12,500 but is otherwise unremarkable. Patient was given an IV push of Lopressor 5 mg and IV bolus of heparin. She was started on heparin infusion per ACS guidelines and  nitroglycerin infusion was also initiated. Potassium was supplemented with 40 mEq oral and 10 mEq IV. Patient's blood pressure came down with the Lopressor and nitroglycerin and her symptoms began to ease up. Cardiology was consulted by the ED physician and advised that this is likely secondary to hypertensive urgency and blood pressure lowering was advised. Patient remains hemodynamically stable and will be admitted to the stepdown unit for ongoing evaluation and management of suspected hypertensive emergency with non-STEMI.   Review of Systems:  All other systems reviewed and apart from HPI, are negative.  Past Medical History  Diagnosis Date  . Asthma   . Hypertension   . Bell's palsy   . GERD (gastroesophageal reflux disease)   . 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 Cardiomegaly     Past Surgical History  Procedure Laterality Date  . Cesarean section  [place]    x2  . Skin biopsy  2000  . Renal artery stent Left 08/23/2014  . Peripheral vascular catheterization N/A 08/23/2014    Procedure: Renal Angiography;  Surgeon: Adrian Prows, MD;  Location: [facility];  Service: Cardiovascular;  Laterality: N/A;     reports that she has been smoking Cigarettes.  She has a 3.75 pack-year smoking history. She has never used smokeless tobacco. She reports that she does not drink alcohol or use illicit drugs.  No Known Allergies  Family History  Problem Relation Age of Onset  . Migraines Neg Hx   . Hypertension Mother   . Cancer Mother   . Heart defect Father   . Asthma  Father      Prior to Admission medications   Medication Sig Start Date End Date Taking? Authorizing Provider  albuterol (PROVENTIL HFA;VENTOLIN HFA) 108 (90 BASE) MCG/ACT inhaler Inhale 2 puffs into the lungs every 4 (four) hours as needed. As needed for shortness of breath/asthma.   Yes Historical Provider, MD  amLODipine (NORVASC) 10 MG tablet Take 10 mg by mouth daily.   Yes Historical Provider, MD    aspirin 81 MG chewable tablet Chew 81 mg by mouth every morning.    Yes Historical Provider, MD  clopidogrel (PLAVIX) 75 MG tablet Take 1 tablet (75 mg total) by mouth daily with breakfast. 08/24/14  Yes Neldon Labella, NP  losartan (COZAAR) 50 MG tablet Take 50 mg by mouth daily. 06/28/15  Yes Historical Provider, MD  metoprolol succinate (TOPROL-XL) 50 MG 24 hr tablet Take 50 mg by mouth daily. 07/28/14  Yes Historical Provider, MD  mometasone-formoterol (DULERA) 200-5 MCG/ACT AERO Inhale 1 puff into the lungs 2 (two) times daily.   Yes Historical Provider, MD  montelukast (SINGULAIR) 10 MG tablet Take 10 mg by mouth at bedtime.   Yes Historical Provider, MD  nitroGLYCERIN (NITROSTAT) 0.4 MG SL tablet Place 0.4 mg under the tongue every 5 (five) minutes as needed for chest pain.   Yes Historical Provider, MD    Physical Exam: Filed Vitals:   09/17/15 2128  BP: 206/109  Pulse: 75  Temp: 98.4 F (36.9 C)  TempSrc: Oral  Resp: 21  Height: 5\' 2"  (1.575 m)  Weight: 73.936 kg (163 lb)  SpO2: 99%      Constitutional: NAD, calm, in apparent discomfort  Eyes: PERTLA, lids and conjunctivae normal ENMT: Mucous membranes are moist. Posterior pharynx clear of any exudate or lesions.   Neck: normal, supple, no masses, no thyromegaly Respiratory: clear to auscultation bilaterally, no wheezing, no crackles. Normal respiratory effort.  Cardiovascular: S1 & S2 heard, regular rate and rhythm, no significant murmurs / rubs / gallops. No extremity edema. 2+ pedal pulses. No significant JVD. Abdomen: No distension, no tenderness, no masses palpated. Bowel sounds normal.  Musculoskeletal: no clubbing / cyanosis. No joint deformity upper and lower extremities. Normal muscle tone.  Skin: no significant rashes, lesions, ulcers. Warm, dry, well-perfused. Neurologic: CN 2-12 grossly intact. Sensation intact, DTR normal. Strength 5/5 in all 4 limbs.  Psychiatric: Normal judgment and insight. Alert and  oriented x 3. Normal mood and affect.     Labs on Admission: I have personally reviewed following labs and imaging studies  CBC:  Recent Labs Lab 09/17/15 2208  WBC 12.5*  NEUTROABS 7.4  HGB 14.5  HCT 41.4  MCV 87.9  PLT 123XX123   Basic Metabolic Panel:  Recent Labs Lab 09/17/15 2208  NA 140  K 3.0*  CL 109  CO2 23  GLUCOSE 108*  BUN 6  CREATININE 0.77  CALCIUM 9.3   GFR: Estimated Creatinine Clearance: 85.3 mL/min (by C-G formula based on Cr of 0.77). Liver Function Tests: No results for input(s): AST, ALT, ALKPHOS, BILITOT, PROT, ALBUMIN in the last 168 hours. No results for input(s): LIPASE, AMYLASE in the last 168 hours. No results for input(s): AMMONIA in the last 168 hours. Coagulation Profile: No results for input(s): INR, PROTIME in the last 168 hours. Cardiac Enzymes:  Recent Labs Lab 09/17/15 2208  TROPONINI 0.13*   BNP (last 3 results) No results for input(s): PROBNP in the last 8760 hours. HbA1C: No results for input(s): HGBA1C in the last 72 hours. CBG:  No results for input(s): GLUCAP in the last 168 hours. Lipid Profile: No results for input(s): CHOL, HDL, LDLCALC, TRIG, CHOLHDL, LDLDIRECT in the last 72 hours. Thyroid Function Tests: No results for input(s): TSH, T4TOTAL, FREET4, T3FREE, THYROIDAB in the last 72 hours. Anemia Panel: No results for input(s): VITAMINB12, FOLATE, FERRITIN, TIBC, IRON, RETICCTPCT in the last 72 hours. Urine analysis:    Component Value Date/Time   COLORURINE YELLOW 12/06/2008 0800   APPEARANCEUR CLEAR 12/06/2008 0800   LABSPEC 1.018 12/06/2008 0800   PHURINE 5.5 12/06/2008 0800   GLUCOSEU NEGATIVE 12/06/2008 0800   HGBUR NEGATIVE 12/06/2008 0800   BILIRUBINUR NEGATIVE 12/06/2008 0800   KETONESUR NEGATIVE 12/06/2008 0800   PROTEINUR 30* 12/06/2008 0800   UROBILINOGEN 0.2 12/06/2008 0800   NITRITE NEGATIVE 12/06/2008 0800   LEUKOCYTESUR NEGATIVE 12/06/2008 0800   Sepsis  Labs: @LABRCNTIP (procalcitonin:4,lacticidven:4) )No results found for this or any previous visit (from the past 240 hour(s)).   Radiological Exams on Admission: Dg Chest Portable 1 View  09/17/2015  CLINICAL DATA:  Acute onset chest pain, pounding headache. History of asthma, lymphoma. EXAM: PORTABLE CHEST 1 VIEW COMPARISON:  Chest radiograph June 21, 2011 FINDINGS: Cardiomediastinal silhouette is normal. Increased lung volumes without pleural effusion or focal consolidation. Catheter in LEFT chest the subclavian venous approach with distal tip projecting in proximal superior vena cava unchanged. Strandy densities RIGHT lung base. No pneumothorax. Soft tissue planes included osseous structures are unchanged. IMPRESSION: RIGHT lung base atelectasis. Increased lung volumes compatible with asthma. Electronically Signed   By: Elon Alas M.D.   On: 09/17/2015 23:10    EKG: Independently reviewed. Sinus, diffuse flattening and inversions of T-wave  Assessment/Plan  1. Hypertensive emergency with non-STEMI   - Acute-onset CP with radiation to Lt arm; not relieved by NTG at home - BP was reported 250/140 in the field; ASA 324 mg administered by EMS  - End-organ damage is cardiac with diffuse T-wave flattening and inversions on EKG; troponin elevated to 0.13  - Heparin and NTG infusions initiated in ED; ASA 324 en route; Lopressor given in ED; high-intensity statin given on admission  - Cardiology consulted by ED physician and advised lowering BP  - Will admit to stepdown, monitor on telemetry for ischemic changes, trend troponin - Continue heparin infusion with pharmacy to assist; continue NTG infusion  - Given end-organ damage, planning for rapid initial reduction in BP with nitroglycerin and Lopressor    2. Hypokalemia  - Serum potassium 3.0 on admission with uncertain etiology  - 40 mEq oral potassium and 10 mEq IV potassium administered in ED  - Check mag level and replete prn  -  Repeat BMP in the am    3. Asthma, moderate-persistent  - No wheezes on exam, pt denies SOB  - Continue current management with scheduled Dulera and prn albuterol MDI   4. Hx of Lymphoma - Pt reports has been in remission since 2012  [facility] remains in left chest  - No adenopathy appreciated   5. RAS s/p stenting  - Continue Plavix    DVT prophylaxis: Treatment-dose heparin infusion Code Status: Full  Family Communication: Discussed with patient  Disposition Plan: Admit to stepdown   Consults called: Cardiology consulted by ED physician   Admission status: Inpatient     Vianne Bulls, MD [facility] Pager [phone]  If 7PM-7AM, please contact night-coverage [email or web address] Password [facility]  09/18/2015, 12:14 AM

## 2015-09-18 NOTE — ED Notes (Signed)
Attempted report. Nurse that is taking patient has not arrived yet.

## 2015-09-18 NOTE — Progress Notes (Addendum)
CRITICAL VALUE ALERT  Critical value received:  Troponin 1.57   Date of notification:  09/18/15  Time of notification:  [id number]  Critical value read back:Yes.    Nurse who received alert:  Gwendlyn Deutscher RN  MD notified (1st page):  Dr. Rogue Bussing   Time of first page:  [phone]  Dr. Rogue Bussing returned page. Pt currently on heparin drip at this time. Pt will have cardiology consulted this am

## 2015-09-18 NOTE — Progress Notes (Signed)
PROGRESS NOTE    [name]  [id number]  DOB: [birth year]  DOA: 09/17/2015 PCP: Pcp Not In System Outpatient Specialists: 09/18/2015   Hospital course:  HPI: [name] is a 45 y.o. female with medical history significant for hypertension, asthma, lymphoma in remission, and renal artery stenosis status post stent placement 1 year ago, and reported nonadherence to her treatment plan now presenting to the emergency department with acute onset of chest pain just prior to arrival. Patient reports pain in her usual state of health throughout the day today until the sudden onset of pain in her central chest with radiation to the left arm that began while at rest. Patient reports that the symptoms "come in waves," with pain lasting less than 1 minute each time. There is no associated dyspnea, nausea, or diaphoresis, but there is a mild headache. She cannot identify any alleviating or exacerbating factors. She denies any recent use of illicit substances and reports recent adherence to her medications. She reports similar symptoms approximately one month ago, though not nearly as severe. Patient took a nitroglycerin at home with no appreciable change in her symptoms. She activated EMS for transport to the hospital. Blood pressure was reportedly 250/140 on the scene and 324 mg aspirin chew was administered by EMS en route.  Assessment & Plan:   1. Hypertensive emergency - with signs of end organ damage with elevated troponins - Pt's blood pressure is much better controlled at this time.  2. NSTEMI - appreciate cardiology assistance, pt to go to cath later today for further evaluation.  3. History of RAS - pt is s/p left renal stent placement 5/16.  She is on aspirin and plavix now.   4. Hypokalemia - repleted, will follow.   5. Dyslipidemia - resume statin therapy, lipitor has been increased to 80 mg daily.  6. Asthma, moderate persistent - currently stable and asymptomatic.  7. History of  lymphoma - Pt reports remission since 2012.  No adenopathy appreciated.  Port in place - left chest wall.   Consultants:  cardiology  Procedures:  Cath 6/26  Subjective: Pt complaining of headache, no chest pain or SOB  Objective: Filed Vitals:   09/18/15 0630 09/18/15 0700 09/18/15 0811 09/18/15 0900  BP: 155/103 157/93 165/87 163/87  Pulse: 65 66 83 68  Temp:   98.5 F (36.9 C)   TempSrc:   Oral   Resp: 24 22 20 24   Height:      Weight:      SpO2: 96% 96% 98% 98%    Intake/Output Summary (Last 24 hours) at 09/18/15 1022 Last data filed at 09/18/15 0645  Gross per 24 hour  Intake 159.45 ml  Output    800 ml  Net -640.55 ml   Filed Weights   09/17/15 2128 09/18/15 0321  Weight: 163 lb (73.936 kg) 166 lb 3.6 oz (75.4 kg)    Exam:  General exam: awake, alert, cooperative, NAD.  Respiratory system: Clear. No increased work of breathing. Cardiovascular system: S1 & S2 heard, RRR. No JVD, murmurs, gallops, clicks or pedal edema. Gastrointestinal system: Abdomen is nondistended, soft and nontender. Normal bowel sounds heard. Central nervous system: Alert and oriented. No focal neurological deficits. Extremities: no cyanosis or clubbing.   Data Reviewed: Basic Metabolic Panel:  Recent Labs Lab 09/17/15 2208 09/18/15 0340  NA 140 139  K 3.0* 4.1  CL 109 111  CO2 23 21*  GLUCOSE 108* 99  BUN 6 7  CREATININE 0.77 0.65  CALCIUM  9.3 9.0  MG  --  1.9   Liver Function Tests: No results for input(s): AST, ALT, ALKPHOS, BILITOT, PROT, ALBUMIN in the last 168 hours. No results for input(s): LIPASE, AMYLASE in the last 168 hours. No results for input(s): AMMONIA in the last 168 hours. CBC:  Recent Labs Lab 09/17/15 2208  WBC 12.5*  NEUTROABS 7.4  HGB 14.5  HCT 41.4  MCV 87.9  PLT 251   Cardiac Enzymes:  Recent Labs Lab 09/17/15 2208 09/18/15 0340 09/18/15 0544  TROPONINI 0.13* 1.57* 1.45*   BNP (last 3 results) No results for input(s): PROBNP  in the last 8760 hours. CBG: No results for input(s): GLUCAP in the last 168 hours.  Recent Results (from the past 240 hour(s))  MRSA PCR Screening     Status: None   Collection Time: 09/18/15  3:18 AM  Result Value Ref Range Status   MRSA by PCR NEGATIVE NEGATIVE Final    Comment:        The GeneXpert MRSA Assay (FDA approved for NASAL specimens only), is one component of a comprehensive MRSA colonization surveillance program. It is not intended to diagnose MRSA infection nor to guide or monitor treatment for MRSA infections.      Studies: Dg Chest Portable 1 View  09/17/2015  CLINICAL DATA:  Acute onset chest pain, pounding headache. History of asthma, lymphoma. EXAM: PORTABLE CHEST 1 VIEW COMPARISON:  Chest radiograph June 21, 2011 FINDINGS: Cardiomediastinal silhouette is normal. Increased lung volumes without pleural effusion or focal consolidation. Catheter in LEFT chest the subclavian venous approach with distal tip projecting in proximal superior vena cava unchanged. Strandy densities RIGHT lung base. No pneumothorax. Soft tissue planes included osseous structures are unchanged. IMPRESSION: RIGHT lung base atelectasis. Increased lung volumes compatible with asthma. Electronically Signed   By: Elon Alas M.D.   On: 09/17/2015 23:10     Scheduled Meds: . amLODipine  10 mg Oral Daily  . aspirin  81 mg Oral BH-q7a  . atorvastatin  80 mg Oral q1800  . clopidogrel  75 mg Oral Daily  . losartan  50 mg Oral Daily  . [START ON 09/19/2015] metoprolol succinate  75 mg Oral Daily  . mometasone-formoterol  1 puff Inhalation BID  . montelukast  10 mg Oral QHS  . sodium chloride flush  3 mL Intravenous Q12H  . sodium chloride flush  3 mL Intravenous Q12H   Continuous Infusions: . sodium chloride 125 mL/hr at 09/18/15 0936  . heparin 1,050 Units/hr (09/18/15 0600)    Principal Problem:   NSTEMI (non-ST elevated myocardial infarction) [facility]) Active Problems:   Chest pain  at rest   Hypertensive urgency   Hypokalemia   Asthma   History of lymphoma   Time spent:   Irwin Brakeman, MD, FAAFP [facility] Pager [phone]  If 7PM-7AM, please contact night-coverage [email or web address] Password [email or web address] 09/18/2015, 10:22 AM    LOS: 0 days

## 2015-09-18 NOTE — Progress Notes (Signed)
ANTICOAGULATION CONSULT NOTE - Follow Up Consult  Pharmacy Consult:  Heparin Indication: chest pain/ACS  No Known Allergies  Patient Measurements: Height: 5\' 3"  (160 cm) Weight: 166 lb 3.6 oz (75.4 kg) IBW/kg (Calculated) : 52.4 Heparin Dosing Weight: 68 kg  Vital Signs: Temp: 98.4 F (36.9 C) (06/26 1212) Temp Source: Oral (06/26 1212) BP: 145/65 mmHg (06/26 1212) Pulse Rate: 75 (06/26 1212)  Labs:  Recent Labs  09/17/15 2208 09/18/15 0340 09/18/15 0544  HGB 14.5  --   --   HCT 41.4  --   --   PLT 251  --   --   HEPARINUNFRC  --  0.23*  --   CREATININE 0.77 0.65  --   TROPONINI 0.13* 1.57* 1.45*    Estimated Creatinine Clearance: 88.2 mL/min (by C-G formula based on Cr of 0.65).     Assessment: 65 YOF with NSTEMI to continue on IV heparin.  Heparin level at low therapeutic range; no bleeding reported.  Patient just left for cath.   Goal of Therapy:  Heparin level 0.3-0.7 units/ml Monitor platelets by anticoagulation protocol: Yes    Plan:  - Continue heparin gtt at 1050 units/hr - Daily HL / CBC - F/U post cath    Alejandrina Raimer D. Mina Marble, PharmD, BCPS Pager:  [phone] 09/18/2015, 2:57 PM

## 2015-09-18 NOTE — H&P (View-Only) (Signed)
CARDIOLOGY CONSULT NOTE   Patient ID: [name] MRN: [id number] DOB/AGE: [birth year] 45 y.o.  Admit date: 09/17/2015  Primary Physician   Pcp Not In System Primary Cardiologist   Dr. Einar Gip previously (want to followed by [facility]) Reason for Consultation   Chest pain/NSTEMI Requesting Physician  Dr. Wynetta Emery  HPI: [name] is a 45 y.o. female with a history of difficult to control HTN, right sided Bell's palsy in 2001 with residual right-sided facial weakness,  lymphoma of neck, cardiomegaly who came in by EMS for chest pain.   Previously followed by Dr. Einar Gip for difficult to control blood pressure on multiple regimens. Renal duplex showed renal artery stenosis s/p successful stent placement to the left renal artery with reduction in stenosis from 80% to 0%. 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 Einar Gip.   Yesterday while watching TV patient has developed a felt left-sided chest pain. He described the pain as a pressure/sharp. It radiated to her left arm and neck associated with shortness of breath. The pain lasted for approximately 20-30 minutes. She took SL nitroglycerin without improvement and EMS was called. Upon arrival her blood pressure was 250/140. Given IV metoprolol and started on IV nitroglycerin. EKG shows T-wave inversion in inferior lead and lead V3 and V4. Troponin of 0.13 --> 1.57--> 0.45. Potassium was low which returned to normal with supplement. LDL 111. TSH normal. Urine drug test negative. Chest x-ray suggestive of asthma. Currently on IV heparin. Having headache on IV nitroglycerin. Recently having exertional chest pain which resolved with the rest. She denies orthopnea, PND, syncope, lower extremity edema, nausea, vomiting, melena or blood in her stool or urine.  She smokes one fourth pack a day for the past 20 years. Dad had a CABG at age 86 and then MI at  age 39.  Past Medical History  Diagnosis Date  . Asthma   . Hypertension   . Bell's palsy   . GERD (gastroesophageal reflux disease)   . Lymphoma of lymph nodes of neck ([place])   . Cardiomegaly      Past Surgical History  Procedure Laterality Date  . Cesarean section  [place]    x2  . Skin biopsy  2000  . Renal artery stent Left 08/23/2014  . Peripheral vascular catheterization N/A 08/23/2014    Procedure: Renal Angiography;  Surgeon: Adrian Prows, MD;  Location: [place] [facility];  Service: Cardiovascular;  Laterality: N/A;    No Known Allergies  I have reviewed the patient's current medications . amLODipine  10 mg Oral Daily  . aspirin  81 mg Oral BH-q7a  . atorvastatin  40 mg Oral q1800  . clopidogrel  75 mg Oral Daily  . losartan  50 mg Oral Daily  . metoprolol succinate  50 mg Oral Daily  . mometasone-formoterol  1 puff Inhalation BID  . montelukast  10 mg Oral QHS  . sodium chloride flush  3 mL Intravenous Q12H   . heparin 1,050 Units/hr (09/18/15 0600)  . nitroGLYCERIN 110 mcg/min (09/18/15 0600)   sodium chloride, acetaminophen, albuterol, nitroGLYCERIN, ondansetron (ZOFRAN) IV, sodium chloride flush, zolpidem  Prior to Admission medications   Medication Sig Start Date End Date Taking? Authorizing Provider  albuterol (PROVENTIL HFA;VENTOLIN HFA) 108 (90 BASE) MCG/ACT inhaler Inhale 2 puffs into the lungs every 4 (four) hours as needed. As needed for shortness of breath/asthma.   Yes Historical Provider, MD  amLODipine (NORVASC) 10 MG tablet Take 10 mg by mouth daily.   Yes Historical Provider, MD  aspirin 81 MG chewable tablet Chew 81 mg by mouth every morning.    Yes Historical Provider, MD  clopidogrel (PLAVIX) 75 MG tablet Take 1 tablet (75 mg total) by mouth daily with breakfast. 08/24/14  Yes Neldon Labella, NP  losartan (COZAAR) 50 MG tablet Take 50 mg by mouth daily. 06/28/15  Yes Historical Provider, MD  metoprolol succinate (TOPROL-XL) 50 MG 24 hr tablet  Take 50 mg by mouth daily. 07/28/14  Yes Historical Provider, MD  mometasone-formoterol (DULERA) 200-5 MCG/ACT AERO Inhale 1 puff into the lungs 2 (two) times daily.   Yes Historical Provider, MD  montelukast (SINGULAIR) 10 MG tablet Take 10 mg by mouth at bedtime.   Yes Historical Provider, MD  nitroGLYCERIN (NITROSTAT) 0.4 MG SL tablet Place 0.4 mg under the tongue every 5 (five) minutes as needed for chest pain.   Yes Historical Provider, MD     Social History   Social History  . Marital Status: Single    Spouse Name: N/A  . Number of Children: 4  . Years of Education: 9   Occupational History  . Unemployed    Social History Main Topics  . Smoking status: Current Every Day Smoker -- 0.25 packs/day for 15 years    Types: Cigarettes  . Smokeless tobacco: Never Used     Comment: 6 cig per day  . Alcohol Use: No  . Drug Use: No  . Sexual Activity: Not on file   Other Topics Concern  . Not on file   Social History Narrative   Lives at home with fiance and kids   Caffeine use: 2 cups coffee per day   4 Dr. Malachi Bonds (12oz) per day     Family Status  Relation Status Death Age  . Mother Deceased   . Father Deceased    Family History  Problem Relation Age of Onset  . Migraines Neg Hx   . Hypertension Mother   . Cancer Mother   . Heart defect Father   . Asthma Father        ROS:  Full 14 point review of systems complete and found to be negative unless listed above.  Physical Exam: Blood pressure 157/93, pulse 66, temperature 98.4 F (36.9 C), temperature source Oral, resp. rate 22, height 5\' 3"  (1.6 m), weight 166 lb 3.6 oz (75.4 kg), SpO2 96 %.  General: Well developed, well nourished, female appears in discomfort due to headache.  Head: Eyes PERRLA, No xanthomas. Normocephalic and atraumatic, oropharynx without edema or exudate.  Lungs: Resp regular and unlabored, CTA. Heart: RRR no s3, s4, or murmurs..   Neck: No carotid bruits. No lymphadenopathy.  JVD. Abdomen:  Bowel sounds present, abdomen soft and non-tender without masses or hernias noted. Msk:  No spine or cva tenderness. No weakness, no joint deformities or effusions. Extremities: No clubbing, cyanosis or edema. DP/PT/Radials 2+ and equal bilaterally. Neuro: Alert and oriented X 3. No focal deficits noted. Psych:  Good affect, responds appropriately Skin: No rashes or lesions noted.  Labs:   Lab Results  Component Value Date   WBC 12.5* 09/17/2015   HGB 14.5 09/17/2015   HCT 41.4 09/17/2015   MCV 87.9 09/17/2015   PLT 251 09/17/2015   No results for input(s): INR in the last 72 hours.  Recent Labs Lab 09/18/15 0340  NA 139  K 4.1  CL 111  CO2 21*  BUN 7  CREATININE 0.65  CALCIUM 9.0  GLUCOSE 99   MAGNESIUM  Date Value Ref Range Status  09/18/2015 1.9 1.7 - 2.4 mg/dL Final    Recent Labs  09/17/15 2208 09/18/15 0340 09/18/15 0544  TROPONINI 0.13* 1.57* 1.45*   No results for input(s): TROPIPOC in the last 72 hours. No results found for: PROBNP Lab Results  Component Value Date   CHOL 183 09/18/2015   HDL 47 09/18/2015   LDLCALC 111* 09/18/2015   TRIG 123 09/18/2015    Echo: penidng  ECG:  EKG shows sinus rhythm T-wave inversion in inferior lead and lead V3 and V4.  Vent. rate 76 BPM PR interval * ms QRS duration 98 ms QT/QTc 413/465 ms P-R-T axes 68 70 -48  Radiology:  Dg Chest Portable 1 View  09/17/2015  CLINICAL DATA:  Acute onset chest pain, pounding headache. History of asthma, lymphoma. EXAM: PORTABLE CHEST 1 VIEW COMPARISON:  Chest radiograph June 21, 2011 FINDINGS: Cardiomediastinal silhouette is normal. Increased lung volumes without pleural effusion or focal consolidation. Catheter in LEFT chest the subclavian venous approach with distal tip projecting in proximal superior vena cava unchanged. Strandy densities RIGHT lung base. No pneumothorax. Soft tissue planes included osseous structures are unchanged. IMPRESSION: RIGHT lung base atelectasis.  Increased lung volumes compatible with asthma. Electronically Signed   By: Elon Alas M.D.   On: 09/17/2015 23:10    ASSESSMENT AND PLAN:     1. NSTEMI (non-ST elevated myocardial infarction) (HCC) - EKG with new T-wave inversion in inferior lead and lead V3 to V4. Peak of troponin 1.57. In setting of hypokalemia and hypertensive urgency. Cardiac risk factor includes ongoing tobacco abuse, hypertension, strong family history. Her chest pain is concerning for unstable angina. Will discontinue IV nitroglycerin due to severe headache. We will try Nitropaste/patch if chest pain reoccurs. Continue IV heparin. Plan for cardiac catheterization for definite evaluation of coronary anatomy today. Consider radial artery for access given history of pseudoaneurysm of right femoral artery. Continue aspirin, Plavix, beta blocker and losartan.  -The patient understands that risks include but are not limited to stroke (1 in 1000), death (1 in 8), kidney failure [usually temporary] (1 in 500), bleeding (1 in 200), allergic reaction [possibly serious] (1 in 200), and agrees to proceed.   2. Hypertensive urgency - History of difficult to control blood pressure. States that she is compliant with home regimen. Continue Nolvadex 10 mg, losartan 50 mg. Will increase to Troprol XL 75 MG. Further titrate losartan as needed post cath.  3. Hx of renal artery stenosis - s/p L renal stent placement 07/2014. 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 - Continue ASA  And plavix  (for now - she is one year post stent placement)   4.  Hypokalemia - Resoled  5. History of lymphoma - Per primary  6. HL - Will increase Lipitor to 80 mg for possible CAD. Check LFT.    SignedLeanor Kail, [place] 09/18/2015, 8:01 AM Pager [phone]  Co-Sign MD  Patient seen and examined. Agree with assessment and plan.  [name]  is a 45 year old African-American female who has a history of hypertension and is status post left renal artery stent placement and 2016.  Has a history of hyperlipidemia, lymphoma, and presented with hypertensive urgency associated with left-sided chest discomfort.  Her initial blood pressure was 250/140 and she was treated with IV metoprolol and IV nitroglycerin.  She has ruled in for non-ST segment elevation MI with troponin elevation .  Peak at 1.57.  She has a strong family history for CAD with her father undergoing CABG surgery in his 31s and subsequent MI in his 56s.  The patient has a history of tobacco use.  Her ECG has shown sinus rhythm at 82 bpm with LVH and precordial T-wave inversion consistent with ischemia.  She had increased QTc interval.  A 2-D echo Doppler study is currently being done at the bedside.  I reviewed this at the bedside with the patient.  This shows mild global LV dysfunction with an EF of 45-50 with LVH.  She has at least moderate mitral regurgitation with mild TR and PR.  Presently she's not having chest pain and is and on exam there is no chest wall tenderness.  She is now on amlodipine 10 mg, losartan 50 mg, metoprolol succinate 50 mg daily for blood pressure control in addition to IV nitroglycerin.  She is maintained on IV heparin.  She is on atorvastatin for hyperlipidemia and is on dual antiplatelet therapy with aspirin and Plavix.  It is my recommendation that definitive cardiac catheterization be performed. I have reviewed the risks, indications, and alternatives to cardiac catheterization, possible angioplasty, and stenting with the patient. Risks include but are not limited to bleeding, infection, vascular injury, stroke, myocardial infection, arrhythmia, kidney injury, radiation-related injury in the case of prolonged fluoroscopy use, emergency cardiac surgery, and death. The patient understands the risks of serious complication is 1-2 in [id number] with diagnostic cardiac cath  and 1-2% or less with angioplasty/stenting.  We will schedule the patient for  cardiac catheterization later today.  Troy Sine, MD, [facility] 09/18/2015 10:27 AM

## 2015-09-19 ENCOUNTER — Encounter (HOSPITAL_COMMUNITY): Payer: Self-pay | Admitting: Cardiology

## 2015-09-19 LAB — BASIC METABOLIC PANEL
Anion gap: 7 (ref 5–15)
BUN: <5 mg/dL — ABNORMAL LOW (ref 6–20)
CALCIUM: 9.6 mg/dL (ref 8.9–10.3)
CO2: 22 mmol/L (ref 22–32)
CREATININE: 0.67 mg/dL (ref 0.44–1.00)
Chloride: 109 mmol/L (ref 101–111)
GFR calc Af Amer: >60 mL/min (ref >60)
GFR calc non Af Amer: >60 mL/min (ref >60)
GLUCOSE: 98 mg/dL (ref 65–99)
Potassium: 3.7 mmol/L (ref 3.5–5.1)
Sodium: 138 mmol/L (ref 135–145)

## 2015-09-19 LAB — HEPATIC FUNCTION PANEL
ALK PHOS: 116 U/L (ref 38–126)
ALT: 16 U/L (ref 14–54)
AST: 20 U/L (ref 15–41)
Albumin: 3.9 g/dL (ref 3.5–5.0)
BILIRUBIN TOTAL: 0.6 mg/dL (ref 0.3–1.2)
Bilirubin, Direct: <0.1 mg/dL — ABNORMAL LOW (ref 0.1–0.5)
Total Protein: 6.4 g/dL — ABNORMAL LOW (ref 6.5–8.1)

## 2015-09-19 LAB — CBC WITH DIFFERENTIAL/PLATELET
BASOS PCT: 0 %
Basophils Absolute: 0 10*3/uL (ref 0.0–0.1)
EOS ABS: 0.2 10*3/uL (ref 0.0–0.7)
Eosinophils Relative: 2 %
HEMATOCRIT: 42.8 % (ref 36.0–46.0)
Hemoglobin: 14.4 g/dL (ref 12.0–15.0)
Lymphocytes Relative: 40 %
Lymphs Abs: 4.1 10*3/uL — ABNORMAL HIGH (ref 0.7–4.0)
MCH: 30 pg (ref 26.0–34.0)
MCHC: 33.6 g/dL (ref 30.0–36.0)
MCV: 89.2 fL (ref 78.0–100.0)
MONO ABS: 0.7 10*3/uL (ref 0.1–1.0)
MONOS PCT: 7 %
Neutro Abs: 5.1 10*3/uL (ref 1.7–7.7)
Neutrophils Relative %: 51 %
Platelets: 216 10*3/uL (ref 150–400)
RBC: 4.8 MIL/uL (ref 3.87–5.11)
RDW: 14 % (ref 11.5–15.5)
WBC: 10 10*3/uL (ref 4.0–10.5)

## 2015-09-19 LAB — HEPARIN LEVEL (UNFRACTIONATED)
HEPARIN UNFRACTIONATED: 0.12 [IU]/mL — AB (ref 0.30–0.70)
HEPARIN UNFRACTIONATED: 0.46 [IU]/mL (ref 0.30–0.70)
Heparin Unfractionated: 0.3 IU/mL (ref 0.30–0.70)

## 2015-09-19 MED ORDER — ISOSORBIDE MONONITRATE ER 30 MG PO TB24
30.0000 mg | ORAL_TABLET | Freq: Every day | ORAL | Status: DC
  Administered 2015-09-19 – 2015-09-20 (×2): 30 mg via ORAL
  Filled 2015-09-19 (×2): qty 1

## 2015-09-19 MED ORDER — METOPROLOL SUCCINATE ER 100 MG PO TB24
100.0000 mg | ORAL_TABLET | Freq: Every day | ORAL | Status: DC
  Administered 2015-09-20: 100 mg via ORAL
  Filled 2015-09-19: qty 1

## 2015-09-19 MED ORDER — HEPARIN (PORCINE) IN NACL 100-0.45 UNIT/ML-% IJ SOLN
1200.0000 [IU]/h | INTRAMUSCULAR | Status: DC
  Filled 2015-09-19: qty 250

## 2015-09-19 NOTE — Progress Notes (Signed)
CARDIAC REHAB PHASE I   PRE:  Rate/Rhythm: 54 SR  BP:  Sitting: 179/98        SaO2: 97 RA  MODE:  Ambulation: 550 ft   POST:  Rate/Rhythm: 69 SR  BP:  Sitting: 185/97         SaO2: 99 RA  Pt ambulated 550 ft on RA, IV, handheld assist, steady gait, tolerated well, no complaints. Completed MI education.  Reviewed risk factors, tobacco cessation (gave pt fake cigarette), MI book, anti-platelet therapy, activity restrictions, ntg, exercise, heart healthy diet, portion control, and phase 2 cardiac rehab. Pt verbalized understanding, very receptive to education. Pt agrees to phase 2 cardiac rehab referral, will send to [facility] per pt request. Pt to recliner after walk, call bell within reach. Will follow.  [place], RN, BSN 09/19/2015 2:25 PM

## 2015-09-19 NOTE — Progress Notes (Signed)
ANTICOAGULATION CONSULT NOTE - Follow Up Consult  Pharmacy Consult:  Heparin Indication: chest pain/ACS  No Known Allergies  Patient Measurements: Height: 5\' 3"  (160 cm) Weight: 160 lb 9.6 oz (72.848 kg) IBW/kg (Calculated) : 52.4 Heparin Dosing Weight: 68 kg  Vital Signs: Temp: 98.3 F (36.8 C) (06/27 0500) Temp Source: Oral (06/27 0500) BP: 169/92 mmHg (06/27 0500) Pulse Rate: 82 (06/27 0500)  Labs:  Recent Labs  09/17/15 2208  09/18/15 0340 09/18/15 0544 09/18/15 1231 09/19/15 0403 09/19/15 1055 09/19/15 1056  HGB 14.5  --   --   --   --   --   --  14.4  HCT 41.4  --   --   --   --   --   --  42.8  PLT 251  --   --   --   --   --   --  216  LABPROT  --   --   --   --  14.9  --   --   --   INR  --   --   --   --  1.16  --   --   --   HEPARINUNFRC  --   < > 0.23*  --  0.32 0.12* 0.30  --   CREATININE 0.77  --  0.65  --   --   --   --  0.67  TROPONINI 0.13*  --  1.57* 1.45* 0.94*  --   --   --   < > = values in this interval not displayed.  Estimated Creatinine Clearance: 86.7 mL/min (by C-G formula based on Cr of 0.67).     Assessment: 58 YOF with NSTEMI to continue on IV heparin.  Heparin level at low therapeutic range and drawn early; no bleeding reported.     Goal of Therapy:  Heparin level 0.3-0.7 units/ml Monitor platelets by anticoagulation protocol: Yes    Plan:  - Increase heparin gtt slightly to 1200 units/hr - Check confirmatory HL - Daily HL / CBC - F/U long-term AC plan    Tulip Meharg D. Mina Marble, PharmD, BCPS Pager:  [phone] 09/19/2015, 1:33 PM

## 2015-09-19 NOTE — Progress Notes (Signed)
ANTICOAGULATION CONSULT NOTE - Follow Up Consult  Pharmacy Consult for heparin Indication: chest pain/ACS  No Known Allergies  Patient Measurements: Height: 5\' 3"  (160 cm) Weight: 160 lb 9.6 oz (72.848 kg) IBW/kg (Calculated) : 52.4 Heparin Dosing Weight: 68 kg  Vital Signs: Temp: 98 F (36.7 C) (06/27 1951) Temp Source: Oral (06/27 1951) BP: 138/81 mmHg (06/27 1951) Pulse Rate: 83 (06/27 1951)  Labs:  Recent Labs  09/17/15 2208  09/18/15 0340 09/18/15 0544 09/18/15 1231 09/19/15 0403 09/19/15 1055 09/19/15 1056 09/19/15 2100  HGB 14.5  --   --   --   --   --   --  14.4  --   HCT 41.4  --   --   --   --   --   --  42.8  --   PLT 251  --   --   --   --   --   --  216  --   LABPROT  --   --   --   --  14.9  --   --   --   --   INR  --   --   --   --  1.16  --   --   --   --   HEPARINUNFRC  --   < > 0.23*  --  0.32 0.12* 0.30  --  0.46  CREATININE 0.77  --  0.65  --   --   --   --  0.67  --   TROPONINI 0.13*  --  1.57* 1.45* 0.94*  --   --   --   --   < > = values in this interval not displayed.  Estimated Creatinine Clearance: 86.7 mL/min (by C-G formula based on Cr of 0.67).   Medications:  Scheduled:  . amLODipine  10 mg Oral Daily  . aspirin  81 mg Oral BH-q7a  . atorvastatin  80 mg Oral q1800  . clopidogrel  75 mg Oral Daily  . isosorbide mononitrate  30 mg Oral Daily  . losartan  50 mg Oral Daily  . [START ON 09/20/2015] metoprolol succinate  100 mg Oral Daily  . mometasone-formoterol  1 puff Inhalation BID  . montelukast  10 mg Oral QHS  . sodium chloride flush  3 mL Intravenous Q12H  . sodium chloride flush  3 mL Intravenous Q12H   Infusions:  . heparin 1,200 Units/hr (09/19/15 1344)    Assessment: 46 yo female with ACS is currently on therapeutic heparin.  Heparin level is 0.46.  Goal of Therapy:  Heparin level 0.3-0.7 units/ml Monitor platelets by anticoagulation protocol: Yes   Plan:  - continue heparin at 1200 units/hr - heparin level  and CBC in am  Annai Heick, [facility] 09/19/2015,9:38 PM

## 2015-09-19 NOTE — Progress Notes (Signed)
Patient Name: [name] Date of Encounter: 09/19/2015  Principal Problem:   NSTEMI (non-ST elevated myocardial infarction) [facility]) Active Problems:   Chest pain at rest   Hypertensive urgency   Hypokalemia   Asthma   History of lymphoma   Primary Cardiologist: New- Dr. Claiborne Billings Patient Profile:[name] is a 45 y.o. 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  Left heart cath done yesterday, medical management of mid LAD lesion reccommended.   SUBJECTIVE: feels well, denies chest pain and SOB.    OBJECTIVE Filed Vitals:   09/18/15 1630 09/18/15 1700 09/18/15 2108 09/19/15 0500  BP: 168/86 181/96 179/76 169/92  Pulse:   70 82  Temp:   98.6 F (37 C) 98.3 F (36.8 C)  TempSrc:   Oral Oral  Resp: 25 18    Height:      Weight:    160 lb 9.6 oz (72.848 kg)  SpO2:   94% 97%    Intake/Output Summary (Last 24 hours) at 09/19/15 1154 Last data filed at 09/19/15 0500  Gross per 24 hour  Intake      0 ml  Output    300 ml  Net   -300 ml   Filed Weights   09/17/15 2128 09/18/15 0321 09/19/15 0500  Weight: 163 lb (73.936 kg) 166 lb 3.6 oz (75.4 kg) 160 lb 9.6 oz (72.848 kg)    PHYSICAL EXAM General: Well developed, well nourished, female in no acute distress. Head: Normocephalic, atraumatic.  Neck: Supple without bruits, no JVD. Lungs:  Resp regular and unlabored, CTA. Heart: RRR, S1, S2, no S3, S4, or murmur; no rub. Abdomen: Soft, non-tender, non-distended, BS + x 4.  Extremities: No clubbing, cyanosis, no edema.  Neuro: Alert and oriented X 3. Moves all extremities spontaneously. Psych: Normal affect.  LABS: CBC: Recent Labs  09/17/15 2208 09/19/15 1056  WBC 12.5* 10.0  NEUTROABS 7.4 5.1  HGB 14.5 14.4  HCT 41.4 42.8  MCV 87.9 89.2  PLT 251 216   INR: Recent Labs  09/18/15 1231  INR [id number]   Basic  Metabolic Panel: Recent Labs  09/17/15 2208 09/18/15 0340  NA 140 139  K 3.0* 4.1  CL 109 111  CO2 23 21*  GLUCOSE 108* 99  BUN 6 7  CREATININE 0.77 0.65  CALCIUM 9.3 9.0  MG  --  1.9   Liver Function Tests: Recent Labs  09/19/15 0403  AST 20  ALT 16  ALKPHOS 116  BILITOT 0.6  PROT 6.4*  ALBUMIN 3.9   Cardiac Enzymes: Recent Labs  09/18/15 0340 09/18/15 0544 09/18/15 1231  TROPONINI 1.57* 1.45* 0.94*  BNP:  B NATRIURETIC PEPTIDE  Date/Time Value Ref Range Status  09/18/2015 03:40 AM 107.2* 0.0 - 100.0 pg/mL Final   Fasting Lipid Panel: Recent Labs  09/18/15 0340  CHOL 183  HDL 47  LDLCALC 111*  TRIG 123  CHOLHDL 3.9   Thyroid Function Tests: Recent Labs  09/18/15 0340  TSH 1.192    Current facility-administered medications:  .  0.9 %  sodium chloride infusion, 250 mL, Intravenous, PRN, Ilene Qua Opyd, MD .  0.9 %  sodium chloride infusion, 250 mL, Intravenous, PRN, Leonie Man, MD .  acetaminophen (TYLENOL) tablet 650 mg, 650 mg, Oral, Q4H PRN, Vianne Bulls, MD, 650 mg at 09/18/15 0349 .  albuterol (  PROVENTIL) (2.5 MG/3ML) 0.083% nebulizer solution 3 mL, 3 mL, Inhalation, Q4H PRN, Ilene Qua Opyd, MD .  amLODipine (NORVASC) tablet 10 mg, 10 mg, Oral, Daily, Ilene Qua Opyd, MD, 10 mg at 09/19/15 0843 .  aspirin chewable tablet 81 mg, 81 mg, Oral, BH-q7a, Ilene Qua Opyd, MD, 81 mg at 09/19/15 0844 .  atorvastatin (LIPITOR) tablet 80 mg, 80 mg, Oral, q1800, Bhavinkumar Bhagat, PA, 80 mg at 09/18/15 1705 .  clopidogrel (PLAVIX) tablet 75 mg, 75 mg, Oral, Daily, Ilene Qua Opyd, MD, 75 mg at 09/19/15 0843 .  heparin ADULT infusion 100 units/mL (25000 units/218mL sodium chloride 0.45%), 1,150 Units/hr, Intravenous, Continuous, Erenest Blank, RPH, Last Rate: 11.5 mL/hr at 09/19/15 0532, 1,150 Units/hr at 09/19/15 0532 .  hydrALAZINE (APRESOLINE) injection 10 mg, 10 mg, Intravenous, Q4H PRN, Clanford L Johnson, MD .  losartan (COZAAR) tablet 50 mg, 50  mg, Oral, Daily, Vianne Bulls, MD, 50 mg at 09/19/15 0844 .  metoprolol succinate (TOPROL-XL) 24 hr tablet 75 mg, 75 mg, Oral, Daily, Bhavinkumar Bhagat, PA, 75 mg at 09/19/15 0843 .  mometasone-formoterol (DULERA) 200-5 MCG/ACT inhaler 1 puff, 1 puff, Inhalation, BID, Vianne Bulls, MD, 1 puff at 09/18/15 0806 .  montelukast (SINGULAIR) tablet 10 mg, 10 mg, Oral, QHS, Ilene Qua Opyd, MD, 10 mg at 09/18/15 2204 .  nitroGLYCERIN (NITROSTAT) SL tablet 0.4 mg, 0.4 mg, Sublingual, Q5 Min x 3 PRN, Ilene Qua Opyd, MD .  ondansetron (ZOFRAN) injection 4 mg, 4 mg, Intravenous, Q6H PRN, Vianne Bulls, MD, 4 mg at 09/18/15 1931 .  sodium chloride flush (NS) 0.9 % injection 3 mL, 3 mL, Intravenous, Q12H, Ilene Qua Opyd, MD, 3 mL at 09/18/15 2206 .  sodium chloride flush (NS) 0.9 % injection 3 mL, 3 mL, Intravenous, PRN, Ilene Qua Opyd, MD .  sodium chloride flush (NS) 0.9 % injection 3 mL, 3 mL, Intravenous, Q12H, Leonie Man, MD, 3 mL at 09/18/15 1915 .  sodium chloride flush (NS) 0.9 % injection 3 mL, 3 mL, Intravenous, PRN, Leonie Man, MD .  zolpidem [facility]) tablet 5 mg, 5 mg, Oral, QHS PRN, Vianne Bulls, MD . heparin 1,150 Units/hr (09/19/15 0532)   TELE:   NSR with t wave inversion     ECG: NSR with T wave inversion in anterior leads.   Radiology/Studies: Dg Chest Portable 1 View  09/17/2015  CLINICAL DATA:  Acute onset chest pain, pounding headache. History of asthma, lymphoma. EXAM: PORTABLE CHEST 1 VIEW COMPARISON:  Chest radiograph June 21, 2011 FINDINGS: Cardiomediastinal silhouette is normal. Increased lung volumes without pleural effusion or focal consolidation. Catheter in LEFT chest the subclavian venous approach with distal tip projecting in proximal superior vena cava unchanged. Strandy densities RIGHT lung base. No pneumothorax. Soft tissue planes included osseous structures are unchanged. IMPRESSION: RIGHT lung base atelectasis. Increased lung volumes compatible with  asthma. Electronically Signed   By: Elon Alas M.D.   On: 09/17/2015 23:10     Current Medications:  . amLODipine  10 mg Oral Daily  . aspirin  81 mg Oral BH-q7a  . atorvastatin  80 mg Oral q1800  . clopidogrel  75 mg Oral Daily  . losartan  50 mg Oral Daily  . metoprolol succinate  75 mg Oral Daily  . mometasone-formoterol  1 puff Inhalation BID  . montelukast  10 mg Oral QHS  . sodium chloride flush  3 mL Intravenous Q12H  . sodium chloride flush  3 mL Intravenous  Q12H   . heparin 1,150 Units/hr (09/19/15 0532)    ASSESSMENT AND PLAN: Principal Problem:   NSTEMI (non-ST elevated myocardial infarction) [facility]) Active Problems:   Chest pain at rest   Hypertensive urgency   Hypokalemia   Asthma   History of lymphoma  1. NSTEMI: EKG with new T-wave inversion in inferior lead and lead V3 to V4. Peak of troponin 1.57. In setting of hypokalemia and hypertensive urgency. Cardiac risk factor includes ongoing tobacco abuse, hypertension, strong family history. Left heart cath showed mid LAD to Distal LAD lesion, 65% stenosed. The segment was tortuous with several hinge points. Medical management was reccommended. Continue ASA, plavix, and beta blocker. MD to advise adding low dose isosorbide daily. Still on heparin gtt, continue for today.   2. Hypertensive urgency - History of difficult to control blood pressure. States that she is compliant with home regimen. Metoprolol increased yesterday, can increase losartan to 100mg  daily.   3. Hx of renal artery stenosis - s/p L renal stent placement 07/2014. 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 - Continue ASA And plavix (for now - she is one year post stent placement)   4. Hypokalemia - Resoled  5. History of lymphoma - Per primary  6. HL - Will increase Lipitor to 80 mg for possible CAD. Check LFT.     Signed, Arbutus Leas ,  NP 11:54 AM 09/19/2015 Pager [phone] [name] is a 45 y.o. female   Patient seen and examined. Agree with assessment and plan. Angios reviewed. Agree with initial medical trial. Pulse in the 80's; will titrate metoprolol XL to 100 mg daily and will add nitrates.  LDL 111; titrate atorvastatin to 80 mg; target LDL <70.    Troy Sine, MD, [facility] 09/19/2015 12:45 PM

## 2015-09-19 NOTE — Progress Notes (Signed)
PROGRESS NOTE    [name]  [id number]  DOB: 03-23-1971  DOA: 09/17/2015 PCP: Pcp Not In System Outpatient Specialists: 09/19/2015   Hospital course:  HPI: [name] is a 45 y.o. female with medical history significant for hypertension, asthma, lymphoma in remission, and renal artery stenosis status post stent placement 1 year ago, and reported nonadherence to her treatment plan now presenting to the emergency department with acute onset of chest pain just prior to arrival. Patient reports pain in her usual state of health throughout the day today until the sudden onset of pain in her central chest with radiation to the left arm that began while at rest. Patient reports that the symptoms "come in waves," with pain lasting less than 1 minute each time. There is no associated dyspnea, nausea, or diaphoresis, but there is a mild headache. She cannot identify any alleviating or exacerbating factors. She denies any recent use of illicit substances and reports recent adherence to her medications. She reports similar symptoms approximately one month ago, though not nearly as severe. Patient took a nitroglycerin at home with no appreciable change in her symptoms. She activated EMS for transport to the hospital. Blood pressure was reportedly 250/140 on the scene and 324 mg aspirin chew was administered by EMS en route.  Assessment & Plan:   1. Hypertensive emergency - with signs of end organ damage with elevated troponins - Pt's blood pressure is much better controlled at this time.  2. NSTEMI - appreciate cardiology assistance,cath done 6/26 - plan for trial of medical mgmt - see report and cardiology notes.   3. History of RAS - pt is s/p left renal stent placement 5/16.  She is on aspirin and plavix now.   4. Hypokalemia - repleted, will follow.   5. Dyslipidemia - resume statin therapy, lipitor has been increased to 80 mg daily.  6. Asthma, moderate persistent - currently stable and  asymptomatic.  7. History of lymphoma - Pt reports remission since 2012.  No adenopathy appreciated.  Port in place - left chest wall.   Consultants:  cardiology  Procedures:  Cath 6/26  Subjective: Pt without complaints today.   Objective: Filed Vitals:   09/18/15 1630 09/18/15 1700 09/18/15 2108 09/19/15 0500  BP: 168/86 181/96 179/76 169/92  Pulse:   70 82  Temp:   98.6 F (37 C) 98.3 F (36.8 C)  TempSrc:   Oral Oral  Resp: 25 18    Height:      Weight:    160 lb 9.6 oz (72.848 kg)  SpO2:   94% 97%    Intake/Output Summary (Last 24 hours) at 09/19/15 1339 Last data filed at 09/19/15 0500  Gross per 24 hour  Intake      0 ml  Output    300 ml  Net   -300 ml   Filed Weights   09/17/15 2128 09/18/15 0321 09/19/15 0500  Weight: 163 lb (73.936 kg) 166 lb 3.6 oz (75.4 kg) 160 lb 9.6 oz (72.848 kg)   Exam:  General exam: awake, alert, cooperative, NAD.  Respiratory system: Clear. No increased work of breathing. Cardiovascular system: S1 & S2 heard, RRR. No JVD, murmurs, gallops, clicks or pedal edema. Gastrointestinal system: Abdomen is nondistended, soft and nontender. Normal bowel sounds heard. Central nervous system: Alert and oriented. No focal neurological deficits. Extremities: no cyanosis or clubbing.   Data Reviewed: Basic Metabolic Panel:  Recent Labs Lab 09/17/15 2208 09/18/15 0340 09/19/15 1056  NA 140 139  138  K 3.0* 4.1 3.7  CL 109 111 109  CO2 23 21* 22  GLUCOSE 108* 99 98  BUN 6 7 <5*  CREATININE 0.77 0.65 0.67  CALCIUM 9.3 9.0 9.6  MG  --  1.9  --    Liver Function Tests:  Recent Labs Lab 09/19/15 0403  AST 20  ALT 16  ALKPHOS 116  BILITOT 0.6  PROT 6.4*  ALBUMIN 3.9   No results for input(s): LIPASE, AMYLASE in the last 168 hours. No results for input(s): AMMONIA in the last 168 hours. CBC:  Recent Labs Lab 09/17/15 2208 09/19/15 1056  WBC 12.5* 10.0  NEUTROABS 7.4 5.1  HGB 14.5 14.4  HCT 41.4 42.8  MCV 87.9 89.2   PLT 251 216   Cardiac Enzymes:  Recent Labs Lab 09/17/15 2208 09/18/15 0340 09/18/15 0544 09/18/15 1231  TROPONINI 0.13* 1.57* 1.45* 0.94*   BNP (last 3 results) No results for input(s): PROBNP in the last 8760 hours. CBG: No results for input(s): GLUCAP in the last 168 hours.  Recent Results (from the past 240 hour(s))  MRSA PCR Screening     Status: None   Collection Time: 09/18/15  3:18 AM  Result Value Ref Range Status   MRSA by PCR NEGATIVE NEGATIVE Final    Comment:        The GeneXpert MRSA Assay (FDA approved for NASAL specimens only), is one component of a comprehensive MRSA colonization surveillance program. It is not intended to diagnose MRSA infection nor to guide or monitor treatment for MRSA infections.      Studies: Dg Chest Portable 1 View  09/17/2015  CLINICAL DATA:  Acute onset chest pain, pounding headache. History of asthma, lymphoma. EXAM: PORTABLE CHEST 1 VIEW COMPARISON:  Chest radiograph June 21, 2011 FINDINGS: Cardiomediastinal silhouette is normal. Increased lung volumes without pleural effusion or focal consolidation. Catheter in LEFT chest the subclavian venous approach with distal tip projecting in proximal superior vena cava unchanged. Strandy densities RIGHT lung base. No pneumothorax. Soft tissue planes included osseous structures are unchanged. IMPRESSION: RIGHT lung base atelectasis. Increased lung volumes compatible with asthma. Electronically Signed   By: Elon Alas M.D.   On: 09/17/2015 23:10   Scheduled Meds: . amLODipine  10 mg Oral Daily  . aspirin  81 mg Oral BH-q7a  . atorvastatin  80 mg Oral q1800  . clopidogrel  75 mg Oral Daily  . isosorbide mononitrate  30 mg Oral Daily  . losartan  50 mg Oral Daily  . [START ON 09/20/2015] metoprolol succinate  100 mg Oral Daily  . mometasone-formoterol  1 puff Inhalation BID  . montelukast  10 mg Oral QHS  . sodium chloride flush  3 mL Intravenous Q12H  . sodium chloride flush   3 mL Intravenous Q12H   Continuous Infusions: . heparin     Principal Problem:   NSTEMI (non-ST elevated myocardial infarction) [facility]) Active Problems:   Chest pain at rest   Hypertensive urgency   Hypokalemia   Asthma   History of lymphoma  Time spent:   Irwin Brakeman, MD, FAAFP [facility] Pager [phone]  If 7PM-7AM, please contact night-coverage [email or web address] Password [email or web address] 09/19/2015, 1:39 PM    LOS: 1 day

## 2015-09-19 NOTE — Progress Notes (Signed)
ANTICOAGULATION CONSULT NOTE - Follow Up Consult  Pharmacy Consult for Heparin  Indication: chest pain/ACS  No Known Allergies  Patient Measurements: Height: 5\' 3"  (160 cm) Weight: 166 lb 3.6 oz (75.4 kg) IBW/kg (Calculated) : 52.4  Vital Signs: Temp: 98.6 F (37 C) (06/26 2108) Temp Source: Oral (06/26 2108) BP: 179/76 mmHg (06/26 2108) Pulse Rate: 70 (06/26 2108)  Labs:  Recent Labs  09/17/15 2208 09/18/15 0340 09/18/15 0544 09/18/15 1231 09/19/15 0403  HGB 14.5  --   --   --   --   HCT 41.4  --   --   --   --   PLT 251  --   --   --   --   LABPROT  --   --   --  14.9  --   INR  --   --   --  1.16  --   HEPARINUNFRC  --  0.23*  --  0.32 0.12*  CREATININE 0.77 0.65  --   --   --   TROPONINI 0.13* 1.57* 1.45* 0.94*  --     Estimated Creatinine Clearance: 88.2 mL/min (by C-G formula based on Cr of 0.65).   Assessment: Heparin for elevated troponin, now s/p cath, HL is low, heparin level drawn a little early so will be more conservative with rate increase, no issues per RN.   Goal of Therapy:  Heparin level 0.3-0.7 units/ml Monitor platelets by anticoagulation protocol: Yes   Plan:  -Increase heparin to 1150 units/hr -1300 HL  Vedant Shehadeh 09/19/2015,5:17 AM

## 2015-09-20 ENCOUNTER — Telehealth: Payer: Self-pay | Admitting: Cardiology

## 2015-09-20 ENCOUNTER — Inpatient Hospital Stay (HOSPITAL_COMMUNITY): Payer: Medicaid Other

## 2015-09-20 DIAGNOSIS — I701 Atherosclerosis of renal artery: Secondary | ICD-10-CM

## 2015-09-20 DIAGNOSIS — R609 Edema, unspecified: Secondary | ICD-10-CM

## 2015-09-20 DIAGNOSIS — E876 Hypokalemia: Secondary | ICD-10-CM

## 2015-09-20 DIAGNOSIS — Z8579 Personal history of other malignant neoplasms of lymphoid, hematopoietic and related tissues: Secondary | ICD-10-CM

## 2015-09-20 LAB — CBC
HCT: 42.8 % (ref 36.0–46.0)
HEMOGLOBIN: 14.1 g/dL (ref 12.0–15.0)
MCH: 28.8 pg (ref 26.0–34.0)
MCHC: 32.9 g/dL (ref 30.0–36.0)
MCV: 87.5 fL (ref 78.0–100.0)
PLATELETS: 221 10*3/uL (ref 150–400)
RBC: 4.89 MIL/uL (ref 3.87–5.11)
RDW: 14.1 % (ref 11.5–15.5)
WBC: 10 10*3/uL (ref 4.0–10.5)

## 2015-09-20 LAB — BASIC METABOLIC PANEL
Anion gap: 11 (ref 5–15)
BUN: 6 mg/dL (ref 6–20)
CALCIUM: 9.6 mg/dL (ref 8.9–10.3)
CO2: 22 mmol/L (ref 22–32)
CREATININE: 0.83 mg/dL (ref 0.44–1.00)
Chloride: 107 mmol/L (ref 101–111)
Glucose, Bld: 88 mg/dL (ref 65–99)
Potassium: 3.8 mmol/L (ref 3.5–5.1)
SODIUM: 140 mmol/L (ref 135–145)

## 2015-09-20 LAB — HEPARIN LEVEL (UNFRACTIONATED): HEPARIN UNFRACTIONATED: 0.52 [IU]/mL (ref 0.30–0.70)

## 2015-09-20 MED ORDER — ALBUTEROL SULFATE HFA 108 (90 BASE) MCG/ACT IN AERS: 2.0000 | INHALATION_SPRAY | RESPIRATORY_TRACT | Status: DC | PRN

## 2015-09-20 MED ORDER — METOPROLOL SUCCINATE ER 100 MG PO TB24: 100.0000 mg | ORAL_TABLET | Freq: Every day | ORAL | Status: DC

## 2015-09-20 MED ORDER — ISOSORBIDE MONONITRATE ER 30 MG PO TB24: 30.0000 mg | ORAL_TABLET | Freq: Every day | ORAL | Status: DC

## 2015-09-20 MED ORDER — CLOPIDOGREL BISULFATE 75 MG PO TABS: 75.0000 mg | ORAL_TABLET | Freq: Every day | ORAL | Status: DC

## 2015-09-20 MED ORDER — ATORVASTATIN CALCIUM 80 MG PO TABS: 80.0000 mg | ORAL_TABLET | Freq: Every day | ORAL | Status: DC

## 2015-09-20 MED ORDER — MOMETASONE FURO-FORMOTEROL FUM 200-5 MCG/ACT IN AERO: 1.0000 | INHALATION_SPRAY | Freq: Two times a day (BID) | RESPIRATORY_TRACT | Status: DC

## 2015-09-20 MED ORDER — MONTELUKAST SODIUM 10 MG PO TABS: 10.0000 mg | ORAL_TABLET | Freq: Every day | ORAL | Status: DC

## 2015-09-20 NOTE — Discharge Summary (Signed)
Discharge Summary  [name] [id number] DOB: 1970-11-30  PCP: Pcp Not In System  Admit date: 09/17/2015 Discharge date: 09/20/2015  Time spent: <22mins  Recommendations for Outpatient Follow-up:  1. F/u with PMD within a week  for hospital discharge follow up, repeat cbc/bmp at follow up 2. F/u with cardiology for NSTEMI  Discharge Diagnoses:  Active Hospital Problems   Diagnosis Date Noted  . NSTEMI (non-ST elevated myocardial infarction) ([place]) 09/18/2015  . Chest pain at rest 09/18/2015  . Hypertensive urgency 09/18/2015  . Hypokalemia 09/18/2015  . Asthma 09/18/2015  . History of lymphoma 09/18/2015    Resolved Hospital Problems   Diagnosis Date Noted Date Resolved  No resolved problems to display.    Discharge Condition: stable  Diet recommendation: heart healthy/carb modified  Filed Weights   09/18/15 0321 09/19/15 0500 09/20/15 0500  Weight: 75.4 kg (166 lb 3.6 oz) 72.848 kg (160 lb 9.6 oz) 73.392 kg (161 lb 12.8 oz)    History of present illness:  Chief Complaint: Chest pain   HPI: [name] is a 45 y.o. female with medical history significant for hypertension, asthma, lymphoma in remission, and renal artery stenosis status post stent placement 1 year ago, and reported nonadherence to her treatment plan now presenting to the emergency department with acute onset of chest pain just prior to arrival. Patient reports pain in her usual state of health throughout the day today until the sudden onset of pain in her central chest with radiation to the left arm that began while at rest. Patient reports that the symptoms "come in waves," with pain lasting less than 1 minute each time. There is no associated dyspnea, nausea, or diaphoresis, but there is a mild headache. She cannot identify any alleviating or exacerbating factors. She denies any recent use of illicit substances and reports recent adherence to her medications. She reports similar symptoms approximately  one month ago, though not nearly as severe. Patient took a nitroglycerin at home with no appreciable change in her symptoms. She activated EMS for transport to the hospital. Blood pressure was reportedly 250/140 on the scene and 324 mg aspirin chew was administered by EMS en route.  ED Course: Upon arrival to the ED, patient is found to be afebrile, saturating well on room air, hypertensive to 206/109, and with vitals otherwise stable. EKG demonstrates a sinus rhythm with diffuse T-wave flattening/inversions. Chest x-ray is notable for atelectasis in the right base. Initial troponin is elevated to a value of 0.13. BMP is notable for hypokalemia to 3.0. CBC features a leukocytosis of 12,500 but is otherwise unremarkable. Patient was given an IV push of Lopressor 5 mg and IV bolus of heparin. She was started on heparin infusion per ACS guidelines and nitroglycerin infusion was also initiated. Potassium was supplemented with 40 mEq oral and 10 mEq IV. Patient's blood pressure came down with the Lopressor and nitroglycerin and her symptoms began to ease up. Cardiology was consulted by the ED physician and advised that this is likely secondary to hypertensive urgency and blood pressure lowering was advised. Patient remains hemodynamically stable and will be admitted to the stepdown unit for ongoing evaluation and management of suspected hypertensive emergency with non-STEMI.   Hospital Course:  Principal Problem:   NSTEMI (non-ST elevated myocardial infarction) [facility]) Active Problems:   Chest pain at rest   Hypertensive urgency   Hypokalemia   Asthma   History of lymphoma   1. Hypertensive emergency - with signs of end organ damage with elevated  troponins - Pt's blood pressure is much better controlled at this time. blood pressure better controlled and she is discharged home on increased dose of toprol XL, continue home dose norvasc and losartan, imdur is new meds for her. She is advised to monitor blood  pressure at home and continue titrate blood pressure meds. She also reported possible h/o OSa, she had a sleep study done two years ago, but she did not know the result, she is advised to contact the ordering physician. 2. NSTEMI - appreciate cardiology assistance,cath done 6/26 - Mid LAD to Dist LAD lesion, 65% stenosed.plan for trial of medical mgmt - see report and cardiology notes. she is continued on betablocker,asa and plavix, she is started on statin. 3. History of RAS - pt is s/p left renal stent placement 5/16. She is on aspirin and plavix now.  4. Hypokalemia - repleted  5. Dyslipidemia - start statin therapy, lipitor 80 mg daily.  6. Asthma, moderate persistent - currently stable and asymptomatic.  7. History of lymphoma - Pt reports remission since 2012. No adenopathy appreciated. Port in place - left chest wall.  Procedures:  Cath 6/26  Consultations:  cardiology  Discharge Exam: BP 174/94 mmHg  Pulse 59  Temp(Src) 97.9 F (36.6 C) (Oral)  Resp 18  Ht 5\' 3"  (1.6 m)  Wt 73.392 kg (161 lb 12.8 oz)  BMI 28.67 kg/m2  SpO2 97%  LMP   General: aaox3 Cardiovascular: RRR Respiratory: CTABL  Discharge Instructions You were cared for by a hospitalist during your hospital stay. If you have any questions about your discharge medications or the care you received while you were in the hospital after you are discharged, you can call the unit and asked to speak with the hospitalist on call if the hospitalist that took care of you is not available. Once you are discharged, your primary care physician will handle any further medical issues. Please note that NO REFILLS for any discharge medications will be authorized once you are discharged, as it is imperative that you return to your primary care physician (or establish a relationship with a primary care physician if you do not have one) for your aftercare needs so that they can reassess your need for medications and monitor  your lab values.  Discharge Instructions    Diet - low sodium heart healthy    Complete by:  As directed      Increase activity slowly    Complete by:  As directed             Medication List    TAKE these medications        albuterol 108 (90 Base) MCG/ACT inhaler  Commonly known as:  PROVENTIL HFA;VENTOLIN HFA  Inhale 2 puffs into the lungs every 4 (four) hours as needed. As needed for shortness of breath/asthma.     amLODipine 10 MG tablet  Commonly known as:  NORVASC  Take 10 mg by mouth daily.     aspirin 81 MG chewable tablet  Chew 81 mg by mouth every morning.     atorvastatin 80 MG tablet  Commonly known as:  LIPITOR  Take 1 tablet (80 mg total) by mouth daily at 6 PM.     clopidogrel 75 MG tablet  Commonly known as:  PLAVIX  Take 1 tablet (75 mg total) by mouth daily with breakfast.     isosorbide mononitrate 30 MG 24 hr tablet  Commonly known as:  IMDUR  Take 1 tablet (30 mg  total) by mouth daily.     losartan 50 MG tablet  Commonly known as:  COZAAR  Take 50 mg by mouth daily.     metoprolol succinate 100 MG 24 hr tablet  Commonly known as:  TOPROL XL  Take 1 tablet (100 mg total) by mouth daily. Take with or immediately following a meal.     mometasone-formoterol 200-5 MCG/ACT Aero  Commonly known as:  DULERA  Inhale 1 puff into the lungs 2 (two) times daily.     montelukast 10 MG tablet  Commonly known as:  SINGULAIR  Take 1 tablet (10 mg total) by mouth at bedtime.     nitroGLYCERIN 0.4 MG SL tablet  Commonly known as:  NITROSTAT  Place 0.4 mg under the tongue every 5 (five) minutes as needed for chest pain.       No Known Allergies     Follow-up Information    Follow up with Javier Docker, MD In 1 week.   Specialty:  Internal Medicine   Why:  hospital discharge follow up, please bring in your blood pressure log to hospital discharge appointment.    Contact information:   [address] [place]  [address] [phone]       Follow up with Kerin Ransom, PA-C. Go on 09/28/2015.   Specialties:  Cardiology, Radiology   Why:  @3 :00 for TCM   Contact information:   [address] [place] [address] [phone]        The results of significant diagnostics from this hospitalization (including imaging, microbiology, ancillary and laboratory) are listed below for reference.    Significant Diagnostic Studies: Dg Chest Portable 1 View  09/17/2015  CLINICAL DATA:  Acute onset chest pain, pounding headache. History of asthma, lymphoma. EXAM: PORTABLE CHEST 1 VIEW COMPARISON:  Chest radiograph June 21, 2011 FINDINGS: Cardiomediastinal silhouette is normal. Increased lung volumes without pleural effusion or focal consolidation. Catheter in LEFT chest the subclavian venous approach with distal tip projecting in proximal superior vena cava unchanged. Strandy densities RIGHT lung base. No pneumothorax. Soft tissue planes included osseous structures are unchanged. IMPRESSION: RIGHT lung base atelectasis. Increased lung volumes compatible with asthma. Electronically Signed   By: Elon Alas M.D.   On: 09/17/2015 23:10    Microbiology: Recent Results (from the past 240 hour(s))  MRSA PCR Screening     Status: None   Collection Time: 09/18/15  3:18 AM  Result Value Ref Range Status   MRSA by PCR NEGATIVE NEGATIVE Final    Comment:        The GeneXpert MRSA Assay (FDA approved for NASAL specimens only), is one component of a comprehensive MRSA colonization surveillance program. It is not intended to diagnose MRSA infection nor to guide or monitor treatment for MRSA infections.      Labs: Basic Metabolic Panel:  Recent Labs Lab 09/17/15 2208 09/18/15 0340 09/19/15 1056 09/20/15 0455  NA 140 139 138 140  K 3.0* 4.1 3.7 3.8  CL 109 111 109 107  CO2 23 21* 22 22  GLUCOSE 108* 99 98 88  BUN 6 7 <5* 6  CREATININE 0.77 0.65 0.67 0.83  CALCIUM 9.3 9.0 9.6 9.6  MG   --  1.9  --   --    Liver Function Tests:  Recent Labs Lab 09/19/15 0403  AST 20  ALT 16  ALKPHOS 116  BILITOT 0.6  PROT 6.4*  ALBUMIN 3.9   No results for input(s):  LIPASE, AMYLASE in the last 168 hours. No results for input(s): AMMONIA in the last 168 hours. CBC:  Recent Labs Lab 09/17/15 2208 09/19/15 1056 09/20/15 0455  WBC 12.5* 10.0 10.0  NEUTROABS 7.4 5.1  --   HGB 14.5 14.4 14.1  HCT 41.4 42.8 42.8  MCV 87.9 89.2 87.5  PLT 251 216 221   Cardiac Enzymes:  Recent Labs Lab 09/17/15 2208 09/18/15 0340 09/18/15 0544 09/18/15 1231  TROPONINI 0.13* 1.57* 1.45* 0.94*   BNP: BNP (last 3 results)  Recent Labs  09/18/15 0340  BNP 107.2*    ProBNP (last 3 results) No results for input(s): PROBNP in the last 8760 hours.  CBG: No results for input(s): GLUCAP in the last 168 hours.     SignedFlorencia Reasons MD, PhD  [facility] 09/20/2015, 3:36 PM

## 2015-09-20 NOTE — Progress Notes (Signed)
Patient Name: [name] Date of Encounter: 09/20/2015   SUBJECTIVE  Feeling well. No chest pain, sob or palpitations. Ambulating well. Complains of IV site pain.   CURRENT MEDS . amLODipine  10 mg Oral Daily  . aspirin  81 mg Oral BH-q7a  . atorvastatin  80 mg Oral q1800  . clopidogrel  75 mg Oral Daily  . isosorbide mononitrate  30 mg Oral Daily  . losartan  50 mg Oral Daily  . metoprolol succinate  100 mg Oral Daily  . mometasone-formoterol  1 puff Inhalation BID  . montelukast  10 mg Oral QHS  . sodium chloride flush  3 mL Intravenous Q12H  . sodium chloride flush  3 mL Intravenous Q12H    OBJECTIVE  Filed Vitals:   09/19/15 1350 09/19/15 1951 09/19/15 2122 09/20/15 0500  BP: 185/97 138/81  150/72  Pulse: 69 83  61  Temp: 98.2 F (36.8 C) 98 F (36.7 C)  98 F (36.7 C)  TempSrc: Oral Oral  Oral  Resp: 18 18  18   Height:      Weight:    161 lb 12.8 oz (73.392 kg)  SpO2: 99% 100% 99% 100%    Intake/Output Summary (Last 24 hours) at 09/20/15 0925 Last data filed at 09/19/15 1951  Gross per 24 hour  Intake    240 ml  Output      0 ml  Net    240 ml   Filed Weights   09/18/15 0321 09/19/15 0500 09/20/15 0500  Weight: 166 lb 3.6 oz (75.4 kg) 160 lb 9.6 oz (72.848 kg) 161 lb 12.8 oz (73.392 kg)    PHYSICAL EXAM  General: Pleasant, NAD. Neuro: Alert and oriented X 3. Moves all extremities spontaneously. Psych: Normal affect. HEENT:  Normal  Neck: Supple without bruits or JVD. Lungs:  Resp regular and unlabored, CTA. Heart: RRR no s3, s4, or murmurs. Abdomen: Soft, non-tender, non-distended, BS + x 4.  Extremities: No clubbing, cyanosis or edema. DP/PT/Radials 2+ and equal bilaterally. R forearm IV site had 3 cm bruise with edema, No bruise. R radial cath site without hematoma or bruise.   Accessory Clinical Findings  CBC  Recent Labs  09/17/15 2208 09/19/15 1056 09/20/15 0455  WBC 12.5* 10.0 10.0  NEUTROABS 7.4 5.1  --   HGB 14.5 14.4 14.1    HCT 41.4 42.8 42.8  MCV 87.9 89.2 87.5  PLT 251 216 [id number]   Basic Metabolic Panel  Recent Labs  09/18/15 0340 09/19/15 1056 09/20/15 0455  NA 139 138 140  K 4.1 3.7 3.8  CL 111 109 107  CO2 21* 22 22  GLUCOSE 99 98 88  BUN 7 <5* 6  CREATININE 0.65 0.67 0.83  CALCIUM 9.0 9.6 9.6  MG 1.9  --   --    Liver Function Tests  Recent Labs  09/19/15 0403  AST 20  ALT 16  ALKPHOS 116  BILITOT 0.6  PROT 6.4*  ALBUMIN 3.9   No results for input(s): LIPASE, AMYLASE in the last 72 hours. Cardiac Enzymes  Recent Labs  09/18/15 0340 09/18/15 0544 09/18/15 1231  TROPONINI 1.57* 1.45* 0.94*   BNP Invalid input(s): POCBNP D-Dimer No results for input(s): DDIMER in the last 72 hours. Hemoglobin A1C No results for input(s): HGBA1C in the last 72 hours. Fasting Lipid Panel  Recent Labs  09/18/15 0340  CHOL 183  HDL 47  LDLCALC 111*  TRIG 123  CHOLHDL 3.9   Thyroid Function Tests  Recent Labs  09/18/15 0340  TSH 1.192    TELE  Sinus rhythm at rate of 70-80s  Radiology/Studies  Dg Chest Portable 1 View  09/17/2015  CLINICAL DATA:  Acute onset chest pain, pounding headache. History of asthma, lymphoma. EXAM: PORTABLE CHEST 1 VIEW COMPARISON:  Chest radiograph June 21, 2011 FINDINGS: Cardiomediastinal silhouette is normal. Increased lung volumes without pleural effusion or focal consolidation. Catheter in LEFT chest the subclavian venous approach with distal tip projecting in proximal superior vena cava unchanged. Strandy densities RIGHT lung base. No pneumothorax. Soft tissue planes included osseous structures are unchanged. IMPRESSION: RIGHT lung base atelectasis. Increased lung volumes compatible with asthma. Electronically Signed   By: Elon Alas M.D.   On: 09/17/2015 23:10    ASSESSMENT AND PLAN  1. NSTEMI: EKG with new T-wave inversion in inferior lead and lead V3 to V4. Peak of troponin 1.57, trend down  In setting of hypokalemia and hypertensive  urgency. Left heart cath showed mid LAD to Distal LAD lesion, 65% stenosed. The segment was tortuous with several hinge points. Medical management was reccommended. Continue ASA, plavix, statin, Imdur, cozar and beta blocker.  2. Hypertensive urgency - History of difficult to control blood pressure. BP somewhat improved; on amlodipine as well.   3. Hx of renal artery stenosis - s/p L renal stent placement 07/2014. 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 - Continue ASA And plavix (for now - she is one year post stent placement). Dr. Claiborne Billings to decide to resume plavix of discontinue. As well as repeat US as still hypertensive.   4. HL - 09/18/2015: Cholesterol 183; HDL 47; LDL Cholesterol 111*; Triglycerides 123; VLDL 25 - Continue Lipitor 80mg . LDL goal less than 70.  5.R forearm bruise - Has bruise/hematoma at IV site. No bruit. Stopped IV heparin, will discontinue. Warm compression. If worsen needs [place].      Dispo: Ambulated without chest pain or sob. Likely discharge later today.   Jarrett Soho PA-C Pager [phone]   Patient seen and examined. Agree with assessment and plan. No recurrent chest pain. Recommend resume Plavix with recent elevated troponins/NSTEM; medical therapy. OK for dc today from cardiology perspective. Pt wishes to see me in cardiology f/u; I will be happy to follow patient in office.    Troy Sine, MD, [facility] 09/20/2015 11:40 AM

## 2015-09-20 NOTE — Telephone Encounter (Signed)
New message      TCM on 09-28-15 with Loralie Champagne Vin

## 2015-09-20 NOTE — Progress Notes (Signed)
ANTICOAGULATION CONSULT NOTE - Follow Up Consult  Pharmacy Consult:  Heparin Indication: chest pain/ACS  No Known Allergies  Patient Measurements: Height: 5\' 3"  (160 cm) Weight: 161 lb 12.8 oz (73.392 kg) IBW/kg (Calculated) : 52.4 Heparin Dosing Weight: 68 kg  Vital Signs: Temp: 98 F (36.7 C) (06/28 0500) Temp Source: Oral (06/28 0500) BP: 150/72 mmHg (06/28 0500) Pulse Rate: 61 (06/28 0500)  Labs:  Recent Labs  09/17/15 2208  09/18/15 0340 09/18/15 0544 09/18/15 1231  09/19/15 1055 09/19/15 1056 09/19/15 2100 09/20/15 0455  HGB 14.5  --   --   --   --   --   --  14.4  --  14.1  HCT 41.4  --   --   --   --   --   --  42.8  --  42.8  PLT 251  --   --   --   --   --   --  216  --  221  LABPROT  --   --   --   --  14.9  --   --   --   --   --   INR  --   --   --   --  1.16  --   --   --   --   --   HEPARINUNFRC  --   < > 0.23*  --  0.32  < > 0.30  --  0.46 0.52  CREATININE 0.77  --  0.65  --   --   --   --  0.67  --  0.83  TROPONINI 0.13*  --  1.57* 1.45* 0.94*  --   --   --   --   --   < > = values in this interval not displayed.  Estimated Creatinine Clearance: 83.9 mL/min (by C-G formula based on Cr of 0.83).     Assessment: 27 YOF with NSTEMI to continue on IV heparin post cath.  Heparin level is therapeutic; no bleeding reported.     Goal of Therapy:  Heparin level 0.3-0.7 units/ml Monitor platelets by anticoagulation protocol: Yes    Plan:  - Continue heparin gtt at 1200 units/hr - Daily HL / CBC - F/U [facility] plan   Erinn Huskins D. Mina Marble, PharmD, BCPS Pager:  [phone] 09/20/2015, 8:16 AM

## 2015-09-20 NOTE — Telephone Encounter (Signed)
Patient contacted regarding discharge from [facility] on  09/20/15 Patient understands to follow up with provider {Luke Kilroy PA, on 09/28/15@3  pm at [facility] office  Patient understands discharge instructions? yes Patient understands medications and regiment? yes Patient understands to bring all medications to this visit? yes

## 2015-09-20 NOTE — Progress Notes (Signed)
CARDIAC REHAB PHASE I   PRE:  Rate/Rhythm: 96 SR  BP:  Sitting: 174/64        SaO2: 95 RA  MODE:  Ambulation: 850 ft   POST:  Rate/Rhythm: 93 SR  BP:  Sitting: 164/90         SaO2: 98 RA  Pt ambulated 850 ft on RA, independent, steady gait, tolerated well with no complaints. Pt states she has no questions regarding education at this time. Phase 2 referral sent to [facility]. Pt to bed per pt request after walk, call bell within reach.   [id number] Lenna Sciara, RN, BSN 09/20/2015 9:51 AM

## 2015-09-20 NOTE — Progress Notes (Signed)
Preliminary results by tech - Venous Duplex Right Upper Ext. Completed. Negative for deep and superficial vein thrombosis in the right arm. Oda Cogan, BS, RDMS, RVT

## 2015-09-28 ENCOUNTER — Ambulatory Visit (INDEPENDENT_AMBULATORY_CARE_PROVIDER_SITE_OTHER): Payer: Medicaid Other | Admitting: Cardiology

## 2015-09-28 ENCOUNTER — Encounter: Payer: Self-pay | Admitting: Cardiology

## 2015-09-28 ENCOUNTER — Telehealth (HOSPITAL_COMMUNITY): Payer: Self-pay | Admitting: Cardiac Rehabilitation

## 2015-09-28 VITALS — BP 132/80 | HR 80 | Ht 63.0 in | Wt 150.0 lb

## 2015-09-28 DIAGNOSIS — I214 Non-ST elevation (NSTEMI) myocardial infarction: Secondary | ICD-10-CM

## 2015-09-28 DIAGNOSIS — I2510 Atherosclerotic heart disease of native coronary artery without angina pectoris: Secondary | ICD-10-CM | POA: Diagnosis not present

## 2015-09-28 DIAGNOSIS — I701 Atherosclerosis of renal artery: Secondary | ICD-10-CM

## 2015-09-28 DIAGNOSIS — I160 Hypertensive urgency: Secondary | ICD-10-CM

## 2015-09-28 DIAGNOSIS — Z8579 Personal history of other malignant neoplasms of lymphoid, hematopoietic and related tissues: Secondary | ICD-10-CM

## 2015-09-28 NOTE — Telephone Encounter (Signed)
pc to pt to discuss enrolling in cardiac rehab. Left message.

## 2015-09-28 NOTE — Assessment & Plan Note (Addendum)
Admitted with hypertensive urgency and NSTEMI 09/18/15 B/P currently controlled

## 2015-09-28 NOTE — Assessment & Plan Note (Signed)
08/23/2014: High-grade 90% left renal artery stenosis, reduced to 0%. 6.0 x 18 mm Cordis Blue balloon-expandable stent implanted. (Dr Einar Gip)

## 2015-09-28 NOTE — Patient Instructions (Signed)
Your physician has requested that you have a renal artery duplex. During this test, an ultrasound is used to evaluate blood flow to the kidneys. Allow one hour for this exam. Do not eat after midnight the day before and avoid carbonated beverages. Take your medications as you usually do.  Your physician recommends that you schedule a follow-up appointment in: 3 Months with Dr Claiborne Billings

## 2015-09-28 NOTE — Assessment & Plan Note (Signed)
Stable

## 2015-09-28 NOTE — Assessment & Plan Note (Signed)
Troponin 1.57-LVF NL

## 2015-09-28 NOTE — Progress Notes (Signed)
09/28/2015 [name]   01-Oct-1970  [id number]  Primary Physician Javier Docker, MD Primary Cardiologist: Dr Claiborne Billings  HPI:  45 y.o. female with a history of renovascular HTN, right sided Bell's palsy in 2001 with residual right-sided facial weakness, lymphoma of neck, who came to the ED by EMS 09/17/15 for chest pain. She had previously been followed by Dr. Einar Gip for difficult to control blood pressure on multiple regimens. Renal duplex showed renal artery stenosis and she is s/p successful stent placement to the left renal artery with reduction in stenosis from 80% to 0% May 2016. 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 Einar Gip since. She ruled in for a NSTEMI with Troponin of 1.57. Cath done 09/18/15 showed 65% LAD. It was felt her NSTEMI was secondary to hypertensive urgency. The pt tells me she was taking her medication before that admission. She is in the office today for follow up. She has not had further chest pain severe enough to use NTG.   Current Outpatient Prescriptions  Medication Sig Dispense Refill  . albuterol (PROVENTIL HFA;VENTOLIN HFA) 108 (90 Base) MCG/ACT inhaler Inhale 2 puffs into the lungs every 4 (four) hours as needed. As needed for shortness of breath/asthma. 1 Inhaler 0  . amLODipine (NORVASC) 10 MG tablet Take 10 mg by mouth daily.    Marland Kitchen aspirin 81 MG chewable tablet Chew 81 mg by mouth every morning.     Marland Kitchen atorvastatin (LIPITOR) 80 MG tablet Take 1 tablet (80 mg total) by mouth daily at 6 PM. 30 tablet 0  . clopidogrel (PLAVIX) 75 MG tablet Take 1 tablet (75 mg total) by mouth daily with breakfast. 30 tablet 1  . isosorbide mononitrate (IMDUR) 30 MG 24 hr tablet Take 1 tablet (30 mg total) by mouth daily. 30 tablet 0  . losartan (COZAAR) 50 MG tablet Take 50 mg by mouth daily.  3  . metoprolol succinate (TOPROL XL) 100 MG 24 hr tablet Take 1 tablet (100  mg total) by mouth daily. Take with or immediately following a meal. 30 tablet 0  . mometasone-formoterol (DULERA) 200-5 MCG/ACT AERO Inhale 1 puff into the lungs 2 (two) times daily. 1 Inhaler 0  . montelukast (SINGULAIR) 10 MG tablet Take 1 tablet (10 mg total) by mouth at bedtime. 30 tablet 0  . nitroGLYCERIN (NITROSTAT) 0.4 MG SL tablet Place 0.4 mg under the tongue every 5 (five) minutes as needed for chest pain.     No current facility-administered medications for this visit.    No Known Allergies  Social History   Social History  . Marital Status: Single    Spouse Name: N/A  . Number of Children: 4  . Years of Education: 9   Occupational History  . Unemployed    Social History Main Topics  . Smoking status: Current Every Day Smoker -- 0.25 packs/day for 15 years    Types: Cigarettes  . Smokeless tobacco: Never Used     Comment: 6 cig per day  . Alcohol Use: No  . Drug Use: No  . Sexual Activity: Not on file   Other Topics Concern  . Not on file   Social History Narrative   Lives at home with fiance and kids   Caffeine use: 2 cups coffee per day   4 Dr. Malachi Bonds (12oz) per day      Review of Systems: General: negative for chills, fever,  night sweats or weight changes.  Cardiovascular: negative for chest pain, dyspnea on exertion, edema, orthopnea, palpitations, paroxysmal nocturnal dyspnea or shortness of breath Dermatological: negative for rash Respiratory: negative for cough or wheezing Urologic: negative for hematuria Abdominal: negative for nausea, vomiting, diarrhea, bright red blood per rectum, melena, or hematemesis Neurologic: negative for visual changes, syncope, or dizziness All other systems reviewed and are otherwise negative except as noted above.    Blood pressure 132/80, pulse 80, height 5\' 3"  (1.6 m), weight 150 lb (68.04 kg).  General appearance: alert, cooperative, no distress and Rt facial droop-Bells Palsey Neck: no carotid bruit and no  JVD Lungs: clear to auscultation bilaterally Heart: regular rate and rhythm Abdomen: soft, non-tender; bowel sounds normal; no masses,  no organomegaly Extremities: extremities normal, atraumatic, no cyanosis or edema Pulses: 2+ and symmetric Skin: Skin color, texture, turgor normal. No rashes or lesions Neurologic: Grossly normal   ASSESSMENT AND PLAN:   Hypertensive urgency Admitted with hypertensive urgency and NSTEMI 09/18/15 B/P currently controlled  NSTEMI (non-ST elevated myocardial infarction) (HCC) Troponin 1.57-LVF NL  CAD at cath 09/18/15 65% LAD- medical Rx  Left renal artery stenosis 08/23/2014: High-grade 90% left renal artery stenosis, reduced to 0%. 6.0 x 18 mm Cordis Blue balloon-expandable stent implanted. (Dr Einar Gip)  History of lymphoma Stable   PLAN  Check renal dopplers to document patency. F/U Dr Claiborne Billings in 3 months. Check CMET, Lipids then.   Kerin Ransom PA-C 09/28/2015 3:26 PM

## 2015-09-28 NOTE — Assessment & Plan Note (Signed)
65% LAD- medical Rx

## 2015-10-02 ENCOUNTER — Inpatient Hospital Stay (HOSPITAL_COMMUNITY): Admission: RE | Admit: 2015-10-02 | Payer: Medicaid Other | Source: Ambulatory Visit

## 2015-10-04 ENCOUNTER — Encounter (HOSPITAL_COMMUNITY): Payer: Self-pay | Admitting: Cardiology

## 2015-10-19 ENCOUNTER — Inpatient Hospital Stay (HOSPITAL_COMMUNITY): Admission: RE | Admit: 2015-10-19 | Payer: Medicaid Other | Source: Ambulatory Visit

## 2015-10-23 ENCOUNTER — Ambulatory Visit (HOSPITAL_COMMUNITY): Payer: Medicaid Other

## 2015-10-25 ENCOUNTER — Ambulatory Visit (HOSPITAL_COMMUNITY): Payer: Medicaid Other

## 2015-10-27 ENCOUNTER — Ambulatory Visit (HOSPITAL_COMMUNITY): Payer: Medicaid Other

## 2015-10-30 ENCOUNTER — Ambulatory Visit (HOSPITAL_COMMUNITY): Payer: Medicaid Other

## 2015-11-01 ENCOUNTER — Ambulatory Visit (HOSPITAL_COMMUNITY): Payer: Medicaid Other

## 2015-11-03 ENCOUNTER — Ambulatory Visit (HOSPITAL_COMMUNITY): Payer: Medicaid Other

## 2015-11-06 ENCOUNTER — Ambulatory Visit (HOSPITAL_COMMUNITY): Payer: Medicaid Other

## 2015-11-08 ENCOUNTER — Ambulatory Visit (HOSPITAL_COMMUNITY): Payer: Medicaid Other

## 2015-11-10 ENCOUNTER — Ambulatory Visit (HOSPITAL_COMMUNITY): Payer: Medicaid Other

## 2015-11-13 ENCOUNTER — Ambulatory Visit (HOSPITAL_COMMUNITY): Payer: Medicaid Other

## 2015-11-15 ENCOUNTER — Ambulatory Visit (HOSPITAL_COMMUNITY): Payer: Medicaid Other

## 2015-11-17 ENCOUNTER — Ambulatory Visit (HOSPITAL_COMMUNITY): Payer: Medicaid Other

## 2015-11-20 ENCOUNTER — Ambulatory Visit (HOSPITAL_COMMUNITY): Payer: Medicaid Other

## 2015-11-22 ENCOUNTER — Ambulatory Visit (HOSPITAL_COMMUNITY): Payer: Medicaid Other

## 2015-11-24 ENCOUNTER — Ambulatory Visit (HOSPITAL_COMMUNITY): Payer: Medicaid Other

## 2015-11-29 ENCOUNTER — Ambulatory Visit (HOSPITAL_COMMUNITY): Payer: Medicaid Other

## 2015-12-01 ENCOUNTER — Ambulatory Visit (HOSPITAL_COMMUNITY): Payer: Medicaid Other

## 2015-12-04 ENCOUNTER — Ambulatory Visit (HOSPITAL_COMMUNITY): Payer: Medicaid Other

## 2015-12-06 ENCOUNTER — Ambulatory Visit (HOSPITAL_COMMUNITY): Payer: Medicaid Other

## 2015-12-08 ENCOUNTER — Ambulatory Visit (HOSPITAL_COMMUNITY): Payer: Medicaid Other

## 2015-12-11 ENCOUNTER — Ambulatory Visit (HOSPITAL_COMMUNITY): Payer: Medicaid Other

## 2015-12-13 ENCOUNTER — Ambulatory Visit (HOSPITAL_COMMUNITY): Payer: Medicaid Other

## 2015-12-15 ENCOUNTER — Ambulatory Visit (HOSPITAL_COMMUNITY): Payer: Medicaid Other

## 2015-12-18 ENCOUNTER — Ambulatory Visit (HOSPITAL_COMMUNITY): Payer: Medicaid Other

## 2015-12-20 ENCOUNTER — Ambulatory Visit (HOSPITAL_COMMUNITY): Payer: Medicaid Other

## 2015-12-22 ENCOUNTER — Ambulatory Visit (HOSPITAL_COMMUNITY): Payer: Medicaid Other

## 2015-12-25 ENCOUNTER — Ambulatory Visit (HOSPITAL_COMMUNITY): Payer: Medicaid Other

## 2015-12-27 ENCOUNTER — Ambulatory Visit (HOSPITAL_COMMUNITY): Payer: Medicaid Other

## 2015-12-29 ENCOUNTER — Ambulatory Visit (HOSPITAL_COMMUNITY): Payer: Medicaid Other

## 2016-01-01 ENCOUNTER — Ambulatory Visit (HOSPITAL_COMMUNITY): Payer: Medicaid Other

## 2016-01-03 ENCOUNTER — Ambulatory Visit (HOSPITAL_COMMUNITY): Payer: Medicaid Other

## 2016-01-05 ENCOUNTER — Ambulatory Visit (HOSPITAL_COMMUNITY): Payer: Medicaid Other

## 2016-01-08 ENCOUNTER — Ambulatory Visit (HOSPITAL_COMMUNITY): Payer: Medicaid Other

## 2016-01-10 ENCOUNTER — Ambulatory Visit (HOSPITAL_COMMUNITY): Payer: Medicaid Other

## 2016-01-12 ENCOUNTER — Ambulatory Visit (HOSPITAL_COMMUNITY): Payer: Medicaid Other

## 2016-02-19 ENCOUNTER — Other Ambulatory Visit: Payer: Self-pay | Admitting: Specialist

## 2016-02-19 DIAGNOSIS — N63 Unspecified lump in unspecified breast: Secondary | ICD-10-CM

## 2016-04-12 ENCOUNTER — Emergency Department (HOSPITAL_COMMUNITY): Payer: Medicaid Other

## 2016-04-12 ENCOUNTER — Encounter (HOSPITAL_COMMUNITY): Payer: Self-pay | Admitting: *Deleted

## 2016-04-12 ENCOUNTER — Emergency Department (HOSPITAL_COMMUNITY)
Admission: EM | Admit: 2016-04-12 | Discharge: 2016-04-12 | Disposition: A | Discharge status: Home / Self Care | Payer: Medicaid Other | Attending: Emergency Medicine | Admitting: Emergency Medicine

## 2016-04-12 DIAGNOSIS — J45909 Unspecified asthma, uncomplicated: Secondary | ICD-10-CM | POA: Diagnosis not present

## 2016-04-12 DIAGNOSIS — Z7982 Long term (current) use of aspirin: Secondary | ICD-10-CM | POA: Diagnosis not present

## 2016-04-12 DIAGNOSIS — R0602 Shortness of breath: Secondary | ICD-10-CM | POA: Diagnosis present

## 2016-04-12 DIAGNOSIS — I10 Essential (primary) hypertension: Secondary | ICD-10-CM | POA: Insufficient documentation

## 2016-04-12 DIAGNOSIS — I2510 Atherosclerotic heart disease of native coronary artery without angina pectoris: Secondary | ICD-10-CM | POA: Diagnosis not present

## 2016-04-12 DIAGNOSIS — F17210 Nicotine dependence, cigarettes, uncomplicated: Secondary | ICD-10-CM | POA: Insufficient documentation

## 2016-04-12 DIAGNOSIS — I252 Old myocardial infarction: Secondary | ICD-10-CM | POA: Insufficient documentation

## 2016-04-12 LAB — CBC
HCT: 42.9 % (ref 36.0–46.0)
Hemoglobin: 14.7 g/dL (ref 12.0–15.0)
MCH: 29.8 pg (ref 26.0–34.0)
MCHC: 34.3 g/dL (ref 30.0–36.0)
MCV: 86.8 fL (ref 78.0–100.0)
PLATELETS: 209 10*3/uL (ref 150–400)
RBC: 4.94 MIL/uL (ref 3.87–5.11)
RDW: 14.1 % (ref 11.5–15.5)
WBC: 9 10*3/uL (ref 4.0–10.5)

## 2016-04-12 LAB — BASIC METABOLIC PANEL
Anion gap: 9 (ref 5–15)
BUN: 5 mg/dL — AB (ref 6–20)
CALCIUM: 9.6 mg/dL (ref 8.9–10.3)
CO2: 22 mmol/L (ref 22–32)
CREATININE: 0.62 mg/dL (ref 0.44–1.00)
Chloride: 111 mmol/L (ref 101–111)
Glucose, Bld: 81 mg/dL (ref 65–99)
Potassium: 3.5 mmol/L (ref 3.5–5.1)
SODIUM: 142 mmol/L (ref 135–145)

## 2016-04-12 LAB — I-STAT TROPONIN, ED: TROPONIN I, POC: 0 ng/mL (ref 0.00–0.08)

## 2016-04-12 MED ORDER — METHYLPREDNISOLONE SODIUM SUCC 125 MG IJ SOLR
125.0000 mg | Freq: Once | INTRAMUSCULAR | Status: AC
  Administered 2016-04-12: 125 mg via INTRAVENOUS
  Filled 2016-04-12: qty 2

## 2016-04-12 MED ORDER — PREDNISONE 20 MG PO TABS: 20.0000 mg | ORAL_TABLET | Freq: Two times a day (BID) | ORAL | 0 refills | Status: DC

## 2016-04-12 MED ORDER — BENZONATATE 100 MG PO CAPS: 100.0000 mg | ORAL_CAPSULE | Freq: Three times a day (TID) | ORAL | 0 refills | Status: DC

## 2016-04-12 MED ORDER — BENZONATATE 100 MG PO CAPS
200.0000 mg | ORAL_CAPSULE | Freq: Once | ORAL | Status: AC
  Administered 2016-04-12: 200 mg via ORAL
  Filled 2016-04-12: qty 2

## 2016-04-12 MED ORDER — ALBUTEROL SULFATE (2.5 MG/3ML) 0.083% IN NEBU
5.0000 mg | INHALATION_SOLUTION | Freq: Once | RESPIRATORY_TRACT | Status: AC
  Administered 2016-04-12: 5 mg via RESPIRATORY_TRACT

## 2016-04-12 MED ORDER — ALBUTEROL SULFATE (2.5 MG/3ML) 0.083% IN NEBU
INHALATION_SOLUTION | RESPIRATORY_TRACT | Status: AC
  Filled 2016-04-12: qty 6

## 2016-04-12 MED ORDER — MOMETASONE FURO-FORMOTEROL FUM 200-5 MCG/ACT IN AERO: 2.0000 | INHALATION_SPRAY | Freq: Two times a day (BID) | RESPIRATORY_TRACT | 1 refills | Status: DC

## 2016-04-12 MED ORDER — HYDROCODONE-ACETAMINOPHEN 7.5-325 MG/15ML PO SOLN
10.0000 mL | Freq: Once | ORAL | Status: AC
  Administered 2016-04-12: 10 mL via ORAL
  Filled 2016-04-12: qty 15

## 2016-04-12 MED ORDER — LIDOCAINE-EPINEPHRINE 1 %-1:100000 IJ SOLN: 10.0000 mL | Freq: Once | INTRAMUSCULAR | Status: DC

## 2016-04-12 MED ORDER — ALBUTEROL (5 MG/ML) CONTINUOUS INHALATION SOLN
10.0000 mg/h | INHALATION_SOLUTION | Freq: Once | RESPIRATORY_TRACT | Status: AC
  Administered 2016-04-12: 10 mg/h via RESPIRATORY_TRACT
  Filled 2016-04-12: qty 20

## 2016-04-12 MED ORDER — ALBUTEROL SULFATE (2.5 MG/3ML) 0.083% IN NEBU: 2.5000 mg | INHALATION_SOLUTION | Freq: Four times a day (QID) | RESPIRATORY_TRACT | 12 refills | Status: DC | PRN

## 2016-04-12 NOTE — ED Notes (Signed)
Discharge vitals in and iv out.  Pt getting dressed at this time.

## 2016-04-12 NOTE — ED Triage Notes (Addendum)
Pt states she has had asthma flare-up x 3 weeks.  States last 3 days has been sob and feels as if the R side of her chest is tight.  Wheezing noted in triage.

## 2016-04-12 NOTE — ED Notes (Signed)
Patient verbalized understanding of discharge instructions and denies any further needs or questions at this time. VS stable. Patient ambulatory with steady gait, RN escorted to ED entrance.

## 2016-04-12 NOTE — ED Notes (Signed)
Pt continues to wheeze after breathing tx completed.

## 2016-04-12 NOTE — ED Provider Notes (Signed)
[facility] DEPT Provider Note   CSN: [id number] Arrival date & time: 04/12/16  [id number]     History   Chief Complaint Chief Complaint  Patient presents with  . Shortness of Breath    HPI [name] is a 46 y.o. female. She presents for difficulty breathing.  HPI :  Patient has had asthma symptoms flaring up for the last 2-3 days. History of asthma out of her [place] Mann[place] and out of her albuterol at home.  She did receive a flu vaccine. She has not had fever. No chest pain or shortness of breath and wheezing  Past Medical History:  Diagnosis Date  . Asthma   . Bell's palsy   . Cardiomegaly   . GERD (gastroesophageal reflux disease)   . Hypertension   . Lymphoma of lymph nodes of neck ([place])   . OSA (obstructive sleep apnea)   . Renal artery stenosis [facility])    s/p stent placment to left     Patient Active Problem List   Diagnosis Date Noted  . CAD at cath 09/18/15 09/28/2015  . Chest pain at rest 09/18/2015  . Hypertensive urgency 09/18/2015  . NSTEMI (non-ST elevated myocardial infarction) ([place]) 09/18/2015  . Hypokalemia 09/18/2015  . Asthma 09/18/2015  . History of lymphoma 09/18/2015  . Hypertensive emergency without congestive heart failure   . Left renal artery stenosis ([place]) 08/23/2014  . 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 Headache above the eye region 08/16/2014  . Blurry vision 08/16/2014  . Perceived hearing changes 08/16/2014  . Snoring 08/16/2014  . Witnessed apneic spells 08/16/2014  . Excessive daytime sleepiness 08/16/2014  . Nocturnal headaches 08/16/2014    Past Surgical History:  Procedure Laterality Date  . CARDIAC CATHETERIZATION N/A 09/18/2015   Procedure: Left Heart Cath and Coronary Angiography;  Surgeon: Leonie Man, MD;  Location: [facility];  Service: Cardiovascular;  Laterality: N/A;  . [place]   x2  . PERIPHERAL VASCULAR CATHETERIZATION N/A 08/23/2014   Procedure: Renal Angiography;  Surgeon: Adrian Prows, MD;  Location: [facility];  Service: Cardiovascular;  Laterality: N/A;  . RENAL ARTERY STENT Left 08/23/2014  . SKIN BIOPSY  2000    OB History    No data available       Home Medications    Prior to Admission medications   Medication Sig Start Date End Date Taking? Authorizing Provider  amLODipine (NORVASC) 10 MG tablet Take 10 mg by mouth daily.   Yes Historical Provider, MD  aspirin 81 MG chewable tablet Chew 81 mg by mouth every morning.    Yes Historical Provider, MD  atorvastatin (LIPITOR) 80 MG tablet Take 1 tablet (80 mg total) by mouth daily at 6 PM. 09/20/15  Yes Florencia Reasons, MD  clopidogrel (PLAVIX) 75 MG tablet Take 1 tablet (75 mg total) by mouth daily with breakfast. 09/20/15  Yes Florencia Reasons, MD  isosorbide mononitrate (IMDUR) 30 MG 24 hr tablet Take 1 tablet (30 mg total) by mouth daily. 09/20/15  Yes Florencia Reasons, MD  losartan (COZAAR) 50 MG tablet Take 50 mg by mouth daily. 06/28/15  Yes Historical Provider, MD  metoprolol succinate (TOPROL XL) 100 MG 24 hr tablet Take 1 tablet (100 mg total) by mouth daily. Take with or immediately following a meal. 09/20/15  Yes Florencia Reasons, MD  montelukast (SINGULAIR) 10 MG tablet Take 1 tablet (10 mg total) by mouth at bedtime. 09/20/15  Yes Florencia Reasons, MD  albuterol (PROVENTIL) (2.5  MG/3ML) 0.083% nebulizer solution Take 3 mLs (2.5 mg total) by nebulization every 6 (six) hours as needed for wheezing or shortness of breath. 04/12/16   Tanna Furry, MD  benzonatate (TESSALON) 100 MG capsule Take 1 capsule (100 mg total) by mouth every 8 (eight) hours. 04/12/16   Tanna Furry, MD  mometasone-formoterol (DULERA) 200-5 MCG/ACT AERO Inhale 2 puffs into the lungs 2 (two) times daily. 04/12/16   Tanna Furry, MD  nitroGLYCERIN (NITROSTAT) 0.4 MG SL tablet Place 0.4 mg under the tongue every 5 (five) minutes as needed for chest pain.    Historical Provider, MD  predniSONE (DELTASONE) 20 MG tablet Take 1 tablet (20 mg total) by mouth 2 (two) times daily with a  meal. 04/12/16   Tanna Furry, MD    Family History Family History  Problem Relation Age of Onset  . Hypertension Mother   . Cancer Mother   . Coronary artery disease Father 36    CABG  . Asthma Father   . Migraines Neg Hx     Social History Social History  Substance Use Topics  . Smoking status: Current Every Day Smoker    Packs/day: 0.25    Years: 15.00    Types: Cigarettes  . Smokeless tobacco: Never Used     Comment: 6 cig per day  . Alcohol use No     Allergies   Patient has no known allergies.   Review of Systems Review of Systems  Constitutional: Negative for appetite change, chills, diaphoresis, fatigue and fever.  HENT: Negative for mouth sores, sore throat and trouble swallowing.   Eyes: Negative for visual disturbance.  Respiratory: Positive for cough, shortness of breath and wheezing. Negative for chest tightness.   Cardiovascular: Negative for chest pain.  Gastrointestinal: Negative for abdominal distention, abdominal pain, diarrhea, nausea and vomiting.  Endocrine: Negative for polydipsia, polyphagia and polyuria.  Genitourinary: Negative for dysuria, frequency and hematuria.  Musculoskeletal: Negative for gait problem.  Skin: Negative for color change, pallor and rash.  Neurological: Negative for dizziness, syncope, light-headedness and headaches.  Hematological: Does not bruise/bleed easily.  Psychiatric/Behavioral: Negative for behavioral problems and confusion.     Physical Exam Updated Vital Signs BP 121/67   Pulse 71   Temp 98.4 F (36.9 C) (Oral)   Resp 20   Ht 5\' 3"  (1.6 m)   Wt 154 lb (69.9 kg)   SpO2 96%   BMI 27.28 kg/m   Physical Exam  Constitutional: She is oriented to person, place, and time. She appears well-developed and well-nourished. No distress.  HENT:  Head: Normocephalic.  Eyes: Conjunctivae are normal. Pupils are equal, round, and reactive to light. No scleral icterus.  Neck: Normal range of motion. Neck supple. No  thyromegaly present.  Cardiovascular: Normal rate and regular rhythm.  Exam reveals no gallop and no friction rub.   No murmur heard. Pulmonary/Chest: She is in respiratory distress. She has wheezes. She has no rales.  Tachypneic. 3-4 word dyspnea. Prolongation in all fields. No accessory muscle use.  Abdominal: Soft. Bowel sounds are normal. She exhibits no distension. There is no tenderness. There is no rebound.  Musculoskeletal: Normal range of motion.  Neurological: She is alert and oriented to person, place, and time.  Skin: Skin is warm and dry. No rash noted.  Psychiatric: She has a normal mood and affect. Her behavior is normal.     ED Treatments / Results  Labs (all labs ordered are listed, but only abnormal results are displayed) Labs  Reviewed  BASIC METABOLIC PANEL - Abnormal; Notable for the following:       Result Value   BUN 5 (*)    All other components within normal limits  CBC  I-STAT TROPOININ, ED    EKG  EKG Interpretation None       Radiology Dg Chest 2 View  Result Date: 04/12/2016 CLINICAL DATA:  Shortness of breath and chest pain EXAM: CHEST  2 VIEW COMPARISON:  09/17/2015 FINDINGS: Porta catheter on the left with tip at the SVC level. Chronic interstitial coarsening and borderline hyperinflation. There is no edema, consolidation, effusion, or pneumothorax. Normal heart size and mediastinal contours. IMPRESSION: No acute finding. Chronic bronchitic markings. Electronically Signed   By: Monte Fantasia M.D.   On: 04/12/2016 10:24    Procedures Procedures (including critical care time)  Medications Ordered in ED Medications  albuterol (PROVENTIL) (2.5 MG/3ML) 0.083% nebulizer solution 5 mg (5 mg Nebulization Given 04/12/16 0928)  albuterol (PROVENTIL,VENTOLIN) solution continuous neb (10 mg/hr Nebulization Given 04/12/16 1158)  methylPREDNISolone sodium succinate (SOLU-MEDROL) 125 mg/2 mL injection 125 mg (125 mg Intravenous Given 04/12/16 1209)    HYDROcodone-acetaminophen (HYCET) 7.5-325 mg/15 ml solution 10 mL (10 mLs Oral Given 04/12/16 1203)  benzonatate (TESSALON) capsule 200 mg (200 mg Oral Given 04/12/16 1203)     Initial Impression / Assessment and Plan / ED Course  I have reviewed the triage vital signs and the nursing notes.  Pertinent labs & imaging results that were available during my care of the patient were reviewed by me and considered in my medical decision making (see chart for details).     Patient given 2.5 mg albuterol neb. And given 37 mg up-year-old female over 70 minutes. Given IV Solu-Medrol. Chest x-ray shows no fluid or other pulmonary processes. On recheck she has very minimal index grade wheezing. Observed for an additional hour in the department. Remains comfortable. States she feels "much much better. Saturating well. Not tachypneic and seems appropriate for discharge home we'll renew her Dulera and albuterol. Prednisone course. Recheck with any worsening.  Final Clinical Impressions(s) / ED Diagnoses   Final diagnoses:  Moderate asthma without complication, unspecified whether persistent    New Prescriptions New Prescriptions   ALBUTEROL (PROVENTIL) (2.5 MG/3ML) 0.083% NEBULIZER SOLUTION    Take 3 mLs (2.5 mg total) by nebulization every 6 (six) hours as needed for wheezing or shortness of breath.   BENZONATATE (TESSALON) 100 MG CAPSULE    Take 1 capsule (100 mg total) by mouth every 8 (eight) hours.   MOMETASONE-FORMOTEROL (DULERA) 200-5 MCG/ACT AERO    Inhale 2 puffs into the lungs 2 (two) times daily.   PREDNISONE (DELTASONE) 20 MG TABLET    Take 1 tablet (20 mg total) by mouth 2 (two) times daily with a meal.     Tanna Furry, MD 04/12/16 1520

## 2016-07-11 ENCOUNTER — Other Ambulatory Visit: Payer: Self-pay | Admitting: Specialist

## 2016-07-11 DIAGNOSIS — N63 Unspecified lump in unspecified breast: Secondary | ICD-10-CM

## 2016-07-12 ENCOUNTER — Other Ambulatory Visit: Payer: Self-pay | Admitting: Specialist

## 2016-07-12 ENCOUNTER — Ambulatory Visit
Admission: RE | Admit: 2016-07-12 | Discharge: 2016-07-12 | Disposition: A | Discharge status: Home / Self Care | Payer: Medicaid Other | Source: Ambulatory Visit | Attending: Specialist | Admitting: Specialist

## 2016-07-12 DIAGNOSIS — R2232 Localized swelling, mass and lump, left upper limb: Secondary | ICD-10-CM

## 2016-07-12 DIAGNOSIS — N63 Unspecified lump in unspecified breast: Secondary | ICD-10-CM

## 2016-07-12 DIAGNOSIS — N6310 Unspecified lump in the right breast, unspecified quadrant: Secondary | ICD-10-CM

## 2016-07-12 DIAGNOSIS — R599 Enlarged lymph nodes, unspecified: Secondary | ICD-10-CM

## 2016-07-18 ENCOUNTER — Ambulatory Visit
Admission: RE | Admit: 2016-07-18 | Discharge: 2016-07-18 | Disposition: A | Discharge status: Home / Self Care | Payer: Medicaid Other | Source: Ambulatory Visit | Attending: Specialist | Admitting: Specialist

## 2016-07-18 DIAGNOSIS — N6310 Unspecified lump in the right breast, unspecified quadrant: Secondary | ICD-10-CM

## 2016-07-18 DIAGNOSIS — R599 Enlarged lymph nodes, unspecified: Secondary | ICD-10-CM

## 2016-07-19 ENCOUNTER — Telehealth: Payer: Self-pay | Admitting: *Deleted

## 2016-07-19 NOTE — Telephone Encounter (Signed)
Confirmed [facility] for 07/24/16 at 1215 .  Instructions and contact information given.

## 2016-07-23 ENCOUNTER — Other Ambulatory Visit: Payer: Self-pay | Admitting: *Deleted

## 2016-07-23 ENCOUNTER — Other Ambulatory Visit: Payer: Self-pay | Admitting: Emergency Medicine

## 2016-07-23 ENCOUNTER — Encounter: Payer: Self-pay | Admitting: *Deleted

## 2016-07-23 DIAGNOSIS — Z170 Estrogen receptor positive status [ER+]: Principal | ICD-10-CM

## 2016-07-23 DIAGNOSIS — C50811 Malignant neoplasm of overlapping sites of right female breast: Secondary | ICD-10-CM | POA: Insufficient documentation

## 2016-07-24 ENCOUNTER — Ambulatory Visit: Payer: Self-pay | Admitting: Surgery

## 2016-07-24 ENCOUNTER — Encounter: Payer: Self-pay | Admitting: Radiation Oncology

## 2016-07-24 ENCOUNTER — Other Ambulatory Visit: Payer: Self-pay | Admitting: Hematology and Oncology

## 2016-07-24 ENCOUNTER — Other Ambulatory Visit: Payer: Self-pay | Admitting: *Deleted

## 2016-07-24 ENCOUNTER — Ambulatory Visit (HOSPITAL_BASED_OUTPATIENT_CLINIC_OR_DEPARTMENT_OTHER): Payer: Medicaid Other | Admitting: Hematology and Oncology

## 2016-07-24 ENCOUNTER — Encounter: Payer: Self-pay | Admitting: Physical Therapy

## 2016-07-24 ENCOUNTER — Encounter: Payer: Self-pay | Admitting: Hematology and Oncology

## 2016-07-24 ENCOUNTER — Ambulatory Visit: Payer: Medicaid Other | Attending: Surgery | Admitting: Physical Therapy

## 2016-07-24 ENCOUNTER — Ambulatory Visit
Admission: RE | Admit: 2016-07-24 | Discharge: 2016-07-24 | Disposition: A | Discharge status: Home / Self Care | Payer: Medicaid Other | Source: Ambulatory Visit | Attending: Radiation Oncology | Admitting: Radiation Oncology

## 2016-07-24 ENCOUNTER — Ambulatory Visit (HOSPITAL_COMMUNITY)
Admission: RE | Admit: 2016-07-24 | Discharge: 2016-07-24 | Disposition: A | Discharge status: Home / Self Care | Payer: Medicaid Other | Source: Ambulatory Visit | Attending: Hematology and Oncology | Admitting: Hematology and Oncology

## 2016-07-24 ENCOUNTER — Other Ambulatory Visit (HOSPITAL_BASED_OUTPATIENT_CLINIC_OR_DEPARTMENT_OTHER): Payer: Medicaid Other

## 2016-07-24 VITALS — BP 142/85 | HR 68 | Temp 98.3°F | Resp 18 | Wt 163.7 lb

## 2016-07-24 DIAGNOSIS — C50811 Malignant neoplasm of overlapping sites of right female breast: Secondary | ICD-10-CM

## 2016-07-24 DIAGNOSIS — Z452 Encounter for adjustment and management of vascular access device: Secondary | ICD-10-CM | POA: Diagnosis not present

## 2016-07-24 DIAGNOSIS — Z170 Estrogen receptor positive status [ER+]: Secondary | ICD-10-CM | POA: Diagnosis present

## 2016-07-24 DIAGNOSIS — R293 Abnormal posture: Secondary | ICD-10-CM | POA: Diagnosis present

## 2016-07-24 DIAGNOSIS — C50211 Malignant neoplasm of upper-inner quadrant of right female breast: Secondary | ICD-10-CM | POA: Diagnosis present

## 2016-07-24 DIAGNOSIS — Z1379 Encounter for other screening for genetic and chromosomal anomalies: Secondary | ICD-10-CM

## 2016-07-24 DIAGNOSIS — C50911 Malignant neoplasm of unspecified site of right female breast: Secondary | ICD-10-CM

## 2016-07-24 HISTORY — PX: IR CV LINE INJECTION: IMG2294

## 2016-07-24 LAB — COMPREHENSIVE METABOLIC PANEL
ALT: 10 U/L (ref 0–55)
AST: 12 U/L (ref 5–34)
Albumin: 4.3 g/dL (ref 3.5–5.0)
Alkaline Phosphatase: 133 U/L (ref 40–150)
Anion Gap: 11 mEq/L (ref 3–11)
BUN: 6.3 mg/dL — AB (ref 7.0–26.0)
CHLORIDE: 110 meq/L — AB (ref 98–109)
CO2: 24 mEq/L (ref 22–29)
CREATININE: 0.8 mg/dL (ref 0.6–1.1)
Calcium: 10.1 mg/dL (ref 8.4–10.4)
EGFR: >90 mL/min/{1.73_m2} (ref >90)
GLUCOSE: 52 mg/dL — AB (ref 70–140)
Potassium: 4.1 mEq/L (ref 3.5–5.1)
Sodium: 145 mEq/L (ref 136–145)
Total Bilirubin: 0.36 mg/dL (ref 0.20–1.20)
Total Protein: 7.7 g/dL (ref 6.4–8.3)

## 2016-07-24 LAB — CBC WITH DIFFERENTIAL/PLATELET
BASO%: 0.5 % (ref 0.0–2.0)
Basophils Absolute: 0.1 10*3/uL (ref 0.0–0.1)
EOS%: 1.7 % (ref 0.0–7.0)
Eosinophils Absolute: 0.2 10*3/uL (ref 0.0–0.5)
HCT: 43.7 % (ref 34.8–46.6)
HGB: 14.7 g/dL (ref 11.6–15.9)
LYMPH%: 42.9 % (ref 14.0–49.7)
MCH: 30.6 pg (ref 25.1–34.0)
MCHC: 33.6 g/dL (ref 31.5–36.0)
MCV: 91 fL (ref 79.5–101.0)
MONO#: 0.6 10*3/uL (ref 0.1–0.9)
MONO%: 5.5 % (ref 0.0–14.0)
NEUT%: 49.4 % (ref 38.4–76.8)
NEUTROS ABS: 5.5 10*3/uL (ref 1.5–6.5)
Platelets: 260 10*3/uL (ref 145–400)
RBC: 4.8 10*6/uL (ref 3.70–5.45)
RDW: 13.7 % (ref 11.2–14.5)
WBC: 11.2 10*3/uL — AB (ref 3.9–10.3)
lymph#: 4.8 10*3/uL — ABNORMAL HIGH (ref 0.9–3.3)
nRBC: 0 % (ref 0–0)

## 2016-07-24 MED ORDER — LORAZEPAM 0.5 MG PO TABS: 0.5000 mg | ORAL_TABLET | Freq: Every day | ORAL | 0 refills | Status: DC

## 2016-07-24 MED ORDER — DEXAMETHASONE 4 MG PO TABS: 4.0000 mg | ORAL_TABLET | Freq: Every day | ORAL | 0 refills | Status: DC

## 2016-07-24 MED ORDER — PROCHLORPERAZINE MALEATE 10 MG PO TABS: 10.0000 mg | ORAL_TABLET | Freq: Four times a day (QID) | ORAL | 1 refills | Status: DC | PRN

## 2016-07-24 MED ORDER — ONDANSETRON HCL 8 MG PO TABS: 8.0000 mg | ORAL_TABLET | Freq: Two times a day (BID) | ORAL | 1 refills | Status: DC | PRN

## 2016-07-24 MED ORDER — LIDOCAINE-PRILOCAINE 2.5-2.5 % EX CREA: TOPICAL_CREAM | CUTANEOUS | 3 refills | Status: DC

## 2016-07-24 MED ORDER — IOPAMIDOL (ISOVUE-300) INJECTION 61%
INTRAVENOUS | Status: AC
  Administered 2016-07-24: 10 mL
  Filled 2016-07-24: qty 50

## 2016-07-24 MED ORDER — HEPARIN SOD (PORK) LOCK FLUSH 100 UNIT/ML IV SOLN
INTRAVENOUS | Status: AC
  Filled 2016-07-24: qty 5

## 2016-07-24 NOTE — Progress Notes (Signed)
Nutrition Assessment  Reason for Assessment:  Pt seen in Breast Clinic  ASSESSMENT:   46 year old female with new diagnosis of right breast cancer.  Past medical history heart attack, lymphoma, asthma.  Medications:  reviewed  Labs: reviewed  Anthropometrics:   Height: 63 inches Weight: 150 lb BMI: 28   NUTRITION DIAGNOSIS: Food and nutrition related knowledge deficit related to new diagnosis of breast cancer as evidenced by no prior need for nutrition related information.  INTERVENTION:   Discussed and provided packet of information regarding nutritional tips for breast cancer patients.  Questions answered.  Teachback method used.  Contact information provided and patient knows to contact me with questions/concerns.    MONITORING, EVALUATION, and GOAL: Pt will consume a healthy plant based diet to maintain lean body mass throughout treatment.   [name], [place], [place] Registered Dietitian [phone] (pager)

## 2016-07-24 NOTE — Procedures (Signed)
Port injection demonstrates appropriate positioning and functionality of the left subclavian port-a-catheter with tip terminating within the mid SVC.  Port-a-catheter is ready for immediate use, however note the port is NOT a power port and thus can NOT be used for power contrast injections.  Ronny Bacon, MD Pager #: [phone]

## 2016-07-24 NOTE — H&P (Signed)
[name] 07/24/2016 7:50 AM Location: [facility] Patient #: [id number] DOB: 11-Aug-1970 Undefined / Language: Cleophus Molt / Race: Black or African American Female  History of Present Illness Marcello Moores A. Chloris Marcoux MD; 07/24/2016 3:14 PM) Patient words: Pt sent at the request of Dr Lisbeth Renshaw for right breast mass times 1 year. Pt noted lump underight nipple 1 year ago. It has gotten larger. Mammogram demonstrated 3 discrete masses in the central part of her breast. 2 core bx were done. See below. SHE HAS A HX OF LYMPHOMA AND HAD A LEFT SIDED PORT IN PLACE.    She is now sore in her left breast. No nipple discharge or drainage.     ADDITIONAL INFORMATION: 1. PROGNOSTIC INDICATORS Results: IMMUNOHISTOCHEMICAL AND MORPHOMETRIC ANALYSIS PERFORMED MANUALLY Estrogen Receptor: 100%, POSITIVE, STRONG STAINING INTENSITY Progesterone Receptor: 95%, POSITIVE, STRONG STAINING INTENSITY Proliferation Marker Ki67: 20% REFERENCE RANGE ESTROGEN RECEPTOR NEGATIVE 0% POSITIVE =>1% REFERENCE RANGE PROGESTERONE RECEPTOR NEGATIVE 0% POSITIVE =>1% All controls stained appropriately Enid Cutter MD Pathologist, Electronic Signature ( Signed 07/23/2016) 1. FLUORESCENCE IN-SITU HYBRIDIZATION Results: HER2 - NEGATIVE RATIO OF HER2/CEP17 SIGNALS 1.57 AVERAGE HER2 COPY NUMBER PER CELL 3.45 Reference Range: NEGATIVE HER2/CEP17 Ratio <2.0 and average HER2 copy number <4.0 EQUIVOCAL HER2/CEP17 Ratio <2.0 and average HER2 copy number >=4.0 and <6.0 1 of 5 FINAL for Bouza, Shiane ([id number]) ADDITIONAL INFORMATION:(continued) POSITIVE HER2/CEP17 Ratio >=2.0 or <2.0 and average HER2 copy number >=6.0 Enid Cutter MD Pathologist, Electronic Signature ( Signed 07/22/2016) 2. PROGNOSTIC INDICATORS Results: IMMUNOHISTOCHEMICAL AND MORPHOMETRIC ANALYSIS PERFORMED MANUALLY Estrogen Receptor: 100%, POSITIVE, STRONG STAINING INTENSITY Progesterone Receptor: 95%, POSITIVE, STRONG STAINING  INTENSITY Proliferation Marker Ki67: 30% REFERENCE RANGE ESTROGEN RECEPTOR NEGATIVE 0% POSITIVE =>1% REFERENCE RANGE PROGESTERONE RECEPTOR NEGATIVE 0% POSITIVE =>1% All controls stained appropriately Enid Cutter MD Pathologist, Electronic Signature ( Signed 07/23/2016) 2. By immunohistochemistry, the tumor cells are Negative for Her2 (1+). Enid Cutter MD Pathologist, Electronic Signature ( Signed 07/23/2016) 2. FLUORESCENCE IN-SITU HYBRIDIZATION Results: HER2 - *EQUIVOCAL*. HER2 BY IMMUNOHISTOCHEMISTRY WILL BE PERFORMED AND THE RESULTS REPORTED SEPARATELY. RATIO OF HER2/CEP17 SIGNALS 1.54 AVERAGE HER2 COPY NUMBER PER CELL 4.32 Reference Range: NEGATIVE HER2/CEP17 Ratio <2.0 and average HER2 copy number <4.0 EQUIVOCAL HER2/CEP17 Ratio <2.0 and average HER2 copy number >=4.0 and <6.0 POSITIVE HER2/CEP17 Ratio >=2.0 or <2.0 and average HER2 copy number >=6.0 2 of 5 FINAL for KADI, HESSION ([id number]) ADDITIONAL INFORMATION:(continued) Enid Cutter MD Pathologist, Electronic Signature ( Signed 07/22/2016) 3. PROGNOSTIC INDICATORS Results: IMMUNOHISTOCHEMICAL AND MORPHOMETRIC ANALYSIS PERFORMED MANUALLY Estrogen Receptor: 100%, POSITIVE, STRONG STAINING INTENSITY Progesterone Receptor: 95%, POSITIVE, STRONG STAINING INTENSITY Proliferation Marker Ki67: 30% REFERENCE RANGE ESTROGEN RECEPTOR NEGATIVE 0% POSITIVE =>1% REFERENCE RANGE PROGESTERONE RECEPTOR NEGATIVE 0% POSITIVE =>1% All controls stained appropriately Enid Cutter MD Pathologist, Electronic Signature ( Signed 07/23/2016) 3. FLUORESCENCE IN-SITU HYBRIDIZATION Results: HER2 - NEGATIVE. OF NOTE, A TOTAL OF 40 TUMOR CELLS WERE EVALUATED FOR HER2 EXPRESSION. RATIO OF HER2/CEP17 SIGNALS 1.57 AVERAGE HER2 COPY NUMBER PER CELL 3.45 Reference Range: NEGATIVE HER2/CEP17 Ratio <2.0 and average HER2 copy number <4.0 EQUIVOCAL HER2/CEP17 Ratio <2.0 and average HER2 copy number >=4.0 and <6.0 POSITIVE HER2/CEP17 Ratio  >=2.0 or <2.0 and average HER2 copy number >=6.0 Enid Cutter MD Pathologist, Electronic Signature ( Signed 07/22/2016) FINAL DIAGNOSIS 3 of 5 FINAL for Dupree, [place] ([id number]) Diagnosis 1. Breast, right, needle core biopsy, UIQ retroareolar - INVASIVE DUCTAL CARCINOMA, GRADE III. - LYMPH VASCULAR INVOLVEMENT BY TUMOR. 2. Breast, right, needle core biopsy, near 9:00 o'clock UOQ - INVASIVE DUCTAL CARCINOMA, GRADE III. -  SEE MICROSCOPIC DESCRIPTION. 3. Lymph node, needle/core biopsy, right axillary - METASTATIC CARCINOMA. Microscopic Comment 1. - 3. Breast prognostic profile will be performed. Dr Orene Desanctis agrees. Called to [facility] on 07/19/2016. (JDP:ecj 07/19/2016) Claudette Laws MD Pathologist, Electronic Signature (Case signed 07/19/2016) Specimen Gross and Clinical Information Specimen Comment 1. In formalin 1:38 pm extracted 1 min; large spiculated mass in UIQ retroareolar region 2. In formalin 1:41 pm; small satellite mass, one of three, UOQ near 9 o'clock 3. In formalin 1:44 pm; abnormal axillary lymph node Specimen(s) Obtained: 1. Breast, right, needle core biopsy, UIQ retroareolar 2. Breast, right, needle core biopsy, near 9:00 o'clock UOQ 3. Lymph node, needle/core biopsy, right axillary Specimen Clinical Information 1. Breast CA, multicentric with axillary mets Gross 1. Received in formalin (TIF and CIT not provided), labeled with the patient's name and "right breast UIQ subareolar" are three cores of tan-yellow soft tissue , each 2 cm. Entirely submitted in one cassette. (AK 07/18/2016) 2. Received in formalin (TIF and CIT not provided), labeled with the patient's name and "right breast UOQ" are three cores of tan-yellow soft tissue , ranging from 1.5 to 2 cm. Entirely submitted in one cassette. (AK 07/18/2016) 3. Received in formalin (TIF and CIT not provided), labeled with the patient's name and "right axilla" are three cores of tan-yellow soft  tissue , 1.5 to 1.8 cm. Entirely submitted in one cassette. (AK 07/18/2016) Stain(s) used in Diagnosis: The following stain(s) were used in diagnosing the case: Her2 FISH, PR-ACIS, Her2 by IHC, KI-67-ACIS, ER-ACIS. The control(s) stained appropriately. Disclaimer Ki-67 (MM1), immunohistochemical stains are performed on formalin fixed, paraffin embedded tissue using a 3,3"-diaminobenzidine (DAB) chromogen and Leica Bond Autostainer System. The staining intensity of the nucleus is scored manually and is reported as the percentage of tumor cell nuclei demonstrating specific nuclear staining.Specimens are fixed in 10% Neutral Buffered Formalin for at least 6 hours and up to 72 hours. These tests have not be validated on decalcified tissue. Results should be interpreted with caution given the possibility of false negative results on 4 of 5 FINAL for Daus, Siona [phone]) Disclaimer(continued) decalcified specimens. PR progesterone receptor (16), immunohistochemical stains are performed on formalin      CLINICAL DATA: Right subareolar breast palpable mass felt by the patient approximately 1 year ago.  EXAM: 2D DIGITAL DIAGNOSTIC BILATERAL MAMMOGRAM WITH CAD AND ADJUNCT TOMO  ULTRASOUND RIGHT BREAST  ULTRASOUND LEFT AXILLA  COMPARISON: None available.  ACR Breast Density Category b: There are scattered areas of fibroglandular density.  FINDINGS: Mammographically, there are no suspicious masses, areas of architectural distortion or microcalcifications in the left breast. There is a prominent left axillary lymph node. Injectable port overlies the left axilla.  In the right subareolar breast, there is a hyperdense spiculated mass, measuring 3.8 x 1.9 x 2.4 cm mammographically. Malignant type calcifications are seen associated with the mass. Two smaller satellite nodules are also seen in the right slightly upper outer quadrant, middle depth. There is skin retraction and  periareolar skin thickening.  Mammographic images were processed with CAD.  On physical exam, there is a firm fixed mass in the subareolar right breast. There is skin dimpling and nipple retraction. There is darker discoloration of the nipple.  Targeted right breast ultrasound is performed, showing highly suspicious irregular hypoechoic mass in the subareolar right 1 o'clock breast, which extends to the nipple. The mass measures approximately 4.2 by 2.1 by 3.1 cm. Interspersed microcalcifications are seen within. There are 3 satellite nodules  in the right breast 9 o'clock in linear distribution, the farther of which is located 4.1 cm from the border of the index mass. The middle and largest satellite nodule measures 1.0 x 0.6 x 0.7 cm.  Ultrasound examination of the right axilla demonstrates 2 abnormal right axillary lymph nodes with cortical thickening of up to 8 mm.  Ultrasound examination of the left axilla demonstrates benign-appearing lymph nodes. No evidence of left axillary lymphadenopathy.  IMPRESSION: Highly suspicious calcifications containing right subareolar breast mass, involving the nipple and all 4 quadrants, measuring 4.2 x 2.1 x 3.1 cm sonographically.  Three satellite nodules in linear distribution in the right 9 o'clock breast, the farthest of which is located 4.1 cm from the lateral border of the index mass.  Two adjacent abnormal right axillary lymph nodes.  No evidence of left breast malignancy or left axillary lymphadenopathy.  RECOMMENDATION: Ultrasound-guided core needle biopsy of the right subareolar breast mass, the farthest 9 o'clock satellite nodule, and an abnormal right axillary lymph node.  Patient is on anticoagulation therapy, including Plavix and aspirin. Her physician Dr. Alphonzo Grieve recommends that the patient is taken off aspirin, but she should continue her Plavix at the time of her biopsy.  I have discussed the findings and  recommendations with the patient. Results were also provided in writing at the conclusion of the visit. If applicable, a reminder letter will be sent to the patient regarding the next appointment.  BI-RADS CATEGORY 5: Highly suggestive of malignancy.   Electronically Signed By: Fidela Salisbury M.D. On: 07/12/2016 16:22.  The patient is a 46 year old female.   Past Surgical History Tawni Pummel, RN; 07/24/2016 7:50 AM) Breast Biopsy Right. Cesarean Section - Multiple  Diagnostic Studies History Tawni Pummel, RN; 07/24/2016 7:50 AM) Colonoscopy never Mammogram within last year Pap Smear >5 years ago  Medication History Tawni Pummel, RN; 07/24/2016 7:50 AM) Medications Reconciled  Social History Tawni Pummel, RN; 07/24/2016 7:50 AM) Alcohol use Occasional alcohol use. Caffeine use Carbonated beverages, Coffee. No drug use Tobacco use Current every day smoker.  Family History Tawni Pummel, RN; 07/24/2016 7:50 AM) Alcohol Abuse Father. Cerebrovascular Accident Father. Cervical Cancer Mother, Sister. Diabetes Mellitus Family Members In General. Heart Disease Father. Hypertension Daughter, Mother. Thyroid problems Daughter.  Pregnancy / Birth History Tawni Pummel, RN; 07/24/2016 7:50 AM) Age at menarche 77 years. Contraceptive History Oral contraceptives. Gravida 4 Maternal age <15 Para 53  Other Problems Tawni Pummel, RN; 07/24/2016 7:50 AM) Arthritis Cancer Chest pain Gastroesophageal Reflux Disease High blood pressure Hypercholesterolemia Lump In Breast Myocardial infarction     Review of Systems Sunday Spillers Ledford RN; 07/24/2016 7:50 AM) General Present- Fatigue and Night Sweats. Not Present- Appetite Loss, Chills, Fever, Weight Gain and Weight Loss. Skin Not Present- Change in Wart/Mole, Dryness, Hives, Jaundice, New Lesions, Non-Healing Wounds, Rash and Ulcer. HEENT Present- Seasonal Allergies. Not Present- Earache,  Hearing Loss, Hoarseness, Nose Bleed, Oral Ulcers, Ringing in the Ears, Sinus Pain, Sore Throat, Visual Disturbances, Wears glasses/contact lenses and Yellow Eyes. Respiratory Present- Wheezing. Not Present- Bloody sputum, Chronic Cough, Difficulty Breathing and Snoring. Breast Present- Breast Mass. Not Present- Breast Pain, Nipple Discharge and Skin Changes. Cardiovascular Not Present- Chest Pain, Difficulty Breathing Lying Down, Leg Cramps, Palpitations, Rapid Heart Rate, Shortness of Breath and Swelling of Extremities. Gastrointestinal Not Present- Abdominal Pain, Bloating, Bloody Stool, Change in Bowel Habits, Chronic diarrhea, Constipation, Difficulty Swallowing, Excessive gas, Gets full quickly at meals, Hemorrhoids, Indigestion, Nausea, Rectal Pain and Vomiting. Female Genitourinary Not Present- Frequency,  Nocturia, Painful Urination, Pelvic Pain and Urgency. Musculoskeletal Present- Muscle Pain. Not Present- Back Pain, Joint Pain, Joint Stiffness, Muscle Weakness and Swelling of Extremities. Neurological Not Present- Decreased Memory, Fainting, Headaches, Numbness, Seizures, Tingling, Tremor, Trouble walking and Weakness. Psychiatric Not Present- Anxiety, Bipolar, Change in Sleep Pattern, Depression, Fearful and Frequent crying. Endocrine Present- Hot flashes. Not Present- Cold Intolerance, Excessive Hunger, Hair Changes, Heat Intolerance and New Diabetes. Hematology Present- Blood Thinners and Easy Bruising. Not Present- Excessive bleeding, Gland problems, HIV and Persistent Infections.   Physical Exam (Elleni Mozingo A. Torey Regan MD; 07/24/2016 3:16 PM)  General Mental Status-Alert. General Appearance-Consistent with stated age. Hydration-Well hydrated. Voice-Normal.  Head and Neck Head-normocephalic, atraumatic with no lesions or palpable masses. Trachea-midline. Thyroid Gland Characteristics - normal size and consistency.  Chest and Lung Exam Note: left side port in  place placed in 2005  Breast Note: large subareolar mass with contraction or right breast 4 cm tumor noted bruising noted slight breast retraction mobile tumor overall   left breast normal  Cardiovascular Cardiovascular examination reveals -normal heart sounds, regular rate and rhythm with no murmurs and normal pedal pulses bilaterally.  Neurologic Neurologic evaluation reveals -alert and oriented x 3 with no impairment of recent or remote memory. Mental Status-Normal.  Musculoskeletal Normal Exam - Left-Upper Extremity Strength Normal and Lower Extremity Strength Normal. Normal Exam - Right-Upper Extremity Strength Normal and Lower Extremity Strength Normal.  Lymphatic Head & Neck  General Head & Neck Lymphatics: Bilateral - Description - Normal. Axillary -Note:mild right axillary adenopathy.     Assessment & Plan (Jobeth Pangilinan A. Kemuel Buchmann MD; 07/24/2016 3:19 PM)  BREAST CANCER, RIGHT (C50.911) Impression: Pt requires port placement for chemotherapy. Risk include bleeding, infection, pneumothorax, hemothorax, mediastinal injury, nerve injury , blood vessel injury, strke, blood clots, death, migration. embolization and need for additional procedures. Pt agrees to proceed. neoadjuvant chemotherapy evaluate port in place if it works go ahead and use if not, place new one and remove old one if able risk old port has ingrowth into major vascular structure and wound have to be amputated she is aware of this  Current Plans You are being scheduled for surgery- Our schedulers will call you.  You should hear from our office's scheduling department within 5 working days about the location, date, and time of surgery. We try to make accommodations for patient's preferences in scheduling surgery, but sometimes the OR schedule or the surgeon's schedule prevents [place] from making those accommodations.  If you have not heard from our office [phone]) in 5 working days, call the  office and ask for your surgeon's nurse.  If you have other questions about your diagnosis, plan, or surgery, call the office and ask for your surgeon's nurse.  Pt Education - [facility] Breast Cancer Information Given - [facility] "Breast Journey" Package Pt Education - Pamphlet Given - Breast Biopsy: discussed with patient and provided information. Pt Education - [facility] Portacath HCI Use of a central venous catheter for intravenous therapy was discussed. Technique of catheter placement using ultrasound and fluoroscopy guidance was discussed. Risks such as bleeding, infection, pneumothorax, catheter occlusion, reoperation, and other risks were discussed. I noted a good likelihood this will help address the problem. Questions were answered. The patient expressed understanding & wishes to proceed.

## 2016-07-24 NOTE — Patient Instructions (Signed)

## 2016-07-24 NOTE — Progress Notes (Signed)
START ON PATHWAY REGIMEN - Breast     A cycle is every 21 days:     Docetaxel      Cyclophosphamide   **Always confirm dose/schedule in your pharmacy ordering system**    Patient Characteristics: Preoperative or Nonsurgical Candidate (Clinical Staging), Neoadjuvant Therapy followed by Surgery, Invasive Disease, Chemotherapy, HER2 Negative/Unknown/Equivocal, ER Positive Therapeutic Status: Preoperative or Nonsurgical Candidate (Clinical Staging) AJCC M Category: cM0 AJCC Grade: G3 Breast Surgical Plan: Neoadjuvant Therapy followed by Surgery ER Status: Positive (+) AJCC 8 Stage Grouping: IIB HER2 Status: Negative (-) AJCC T Category: cT2 AJCC N Category: cN1 PR Status: Positive (+)  Intent of Therapy: Curative Intent, Discussed with Patient

## 2016-07-24 NOTE — Assessment & Plan Note (Signed)
07/18/2016: Palpable right breast masses for 1 year; 2 adjacent spiculated masses by ultrasound at 1:00 subareolar 4.2 cm mass; 3 satellite nodules at 9:00; 2 abnormal lymph nodes; biopsy of the 2 masses and the lymph node showed similar-appearing grade 3 IDC ER 100% PR 95% Ki-67 20-30%, HER-2 negative ratio 1.57; T2 N1 stage IIB (AJCC 8)  Pathology and radiology counseling: Discussed with the patient, the details of pathology including the type of breast cancer,the clinical staging, the significance of ER, PR and HER-2/neu receptors and the implications for treatment. After reviewing the pathology in detail, we proceeded to discuss the different treatment options between surgery, radiation, chemotherapy, antiestrogen therapies.  Recommendation based on multidisciplinary tumor board: 1. Neoadjuvant chemotherapy with Adriamycin and Cytoxan dose dense 4 followed by Taxol weekly 12 2. Followed by breast conserving surgery with sentinel lymph node study vs targeted axillary dissection 3. Followed by adjuvant radiation therapy  Chemotherapy Counseling: I discussed the risks and benefits of chemotherapy including the risks of nausea/ vomiting, risk of infection from low WBC count, fatigue due to chemo or anemia, bruising or bleeding due to low platelets, mouth sores, loss/ change in taste and decreased appetite. Liver and kidney function will be monitored through out chemotherapy as abnormalities in liver and kidney function may be a side effect of treatment. Cardiac dysfunction due to Adriamycin was discussed in detail. Risk of permanent bone marrow dysfunction due to chemo were also discussed.  Plan: 1. Port placement to be done next Monday 2. Echocardiogram 3. Chemotherapy class 4. Breast MRI 5. Genetic counseling will also be arranged  PREVENT trial: [phone]  Newly diagnosed breast cancer Stages 1-3 scheduled to receive anthracycline randomized to atorvastatin 40 mg or placebo once daily for  24 months along with 3 cardiac MRIs or 2 years paid for by the trial. I discussed the risks and benefits of atorvastatin including the risk of myopathy and elevation of liver function tests. I explained the randomization process and patient is interested in the trial. I provided her with literature to read and provided her with the contact with the clinical trials nurse to ask any questions regarding the trial.  Return to clinic in 1-2 weeks to start chemotherapy.

## 2016-07-24 NOTE — Progress Notes (Signed)
[facility] NOTE  Patient Care Team: Javier Docker, MD as PCP - General (Internal Medicine) Erroll Luna, MD as Consulting Physician (General Surgery) Nicholas Lose, MD as Consulting Physician (Hematology and Oncology) Kyung Rudd, MD as Consulting Physician (Radiation Oncology)  CHIEF COMPLAINTS/PURPOSE OF CONSULTATION:  Newly diagnosed breast cancer  HISTORY OF PRESENTING ILLNESS:  [name] 46 y.o. female is here because of recent diagnosis of right breast cancer. Patient felt a palpable mass in the right breast for the last 1 year. Only recently she brought it to the attention of the physicians. She underwent a mammogram that revealed initially 2 adjacent spiculated masses. On ultrasound there appeared to be a third lesion as well in between the 2 masses. The largest measured 2.2 cm. The smaller lesions measure 9 mm and a 5 mm. She also had 2 enlarged abnormal axillary lymph nodes. 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 She was presented this morning in the multidisciplinary tumor board and she is here today to discuss the treatment plan. She is accompanied by her daughter. She had a prior history of lymphoma that was treated with [facility] chemotherapy in [place] by Dr. Lonia Chimera. She has been in complete remission from that.  I reviewed her records extensively and collaborated the history with the patient.  SUMMARY OF ONCOLOGIC HISTORY:   Malignant neoplasm of overlapping sites of right breast in female, estrogen receptor positive ([place])   07/18/2016 Initial Diagnosis    Palpable right breast masses for 1 year; 2 adjacent spiculated masses by ultrasound at 1:00 subareolar 4.2 cm mass; 3 satellite nodules at 9:00; 2 abnormal lymph nodes; biopsy of the 2 masses and the lymph node showed similar-appearing grade 3 IDC ER 100% PR  95% Ki-67 20-30%, HER-2 negative ratio 1.57; T2 N1 stage IIB (AJCC 8)       MEDICAL HISTORY:  Past Medical History:  Diagnosis Date  . Asthma   . Bell's palsy   . Cardiomegaly   . GERD (gastroesophageal reflux disease)   . Hypertension   . 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 OSA (obstructive sleep apnea)   . Renal artery stenosis ([facility])    s/p stent placment to left     SURGICAL HISTORY: Past Surgical History:  Procedure Laterality Date  . CARDIAC CATHETERIZATION N/A 09/18/2015   Procedure: Left Heart Cath and Coronary Angiography;  Surgeon: Leonie Man, MD;  Location: [facility];  Service: Cardiovascular;  Laterality: N/A;  . [place]   x2  . PERIPHERAL VASCULAR CATHETERIZATION N/A 08/23/2014   Procedure: Renal Angiography;  Surgeon: Adrian Prows, MD;  Location: [facility];  Service: Cardiovascular;  Laterality: N/A;  . RENAL ARTERY STENT Left 08/23/2014  . SKIN BIOPSY  2000    SOCIAL HISTORY: Social History   Social History  . Marital status: Single    Spouse name: N/A  . Number of children: 4  . Years of education: 9   Occupational History  . Unemployed    Social History Main Topics  . Smoking status: Current Every Day Smoker    Packs/day: 0.50    Years: 15.00    Types: Cigarettes  . Smokeless tobacco: Never Used     Comment: 6 cig per day  . Alcohol use No  . Drug  use: No  . Sexual activity: Not on file   Other Topics Concern  . Not on file   Social History Narrative   Lives at home with fiance and kids   Caffeine use: 2 cups coffee per day   4 Dr. Malachi Bonds (12oz) per day     FAMILY HISTORY: Family History  Problem Relation Age of Onset  . Hypertension Mother   . Cervical cancer Mother     hysterectomy with ?metastatic disease  . Coronary artery disease Father 15    CABG  . Asthma Father   . Breast cancer Maternal Aunt     age at diagnosis unknown  . Migraines Neg Hx     ALLERGIES:  has No Known  Allergies.  MEDICATIONS:  Current Outpatient Prescriptions  Medication Sig Dispense Refill  . albuterol (PROVENTIL) (2.5 MG/3ML) 0.083% nebulizer solution Take 3 mLs (2.5 mg total) by nebulization every 6 (six) hours as needed for wheezing or shortness of breath. 75 mL 12  . amLODipine (NORVASC) 10 MG tablet Take 10 mg by mouth daily.    Marland Kitchen aspirin 81 MG chewable tablet Chew 81 mg by mouth every morning.     Marland Kitchen atorvastatin (LIPITOR) 80 MG tablet Take 1 tablet (80 mg total) by mouth daily at 6 PM. 30 tablet 0  . clopidogrel (PLAVIX) 75 MG tablet Take 1 tablet (75 mg total) by mouth daily with breakfast. 30 tablet 1  . isosorbide mononitrate (IMDUR) 30 MG 24 hr tablet Take 1 tablet (30 mg total) by mouth daily. 30 tablet 0  . losartan (COZAAR) 50 MG tablet Take 50 mg by mouth daily.  3  . metoprolol succinate (TOPROL XL) 100 MG 24 hr tablet Take 1 tablet (100 mg total) by mouth daily. Take with or immediately following a meal. 30 tablet 0  . mometasone-formoterol (DULERA) 200-5 MCG/ACT AERO Inhale 2 puffs into the lungs 2 (two) times daily. 1 Inhaler 1  . montelukast (SINGULAIR) 10 MG tablet Take 1 tablet (10 mg total) by mouth at bedtime. 30 tablet 0  . nitroGLYCERIN (NITROSTAT) 0.4 MG SL tablet Place 0.4 mg under the tongue every 5 (five) minutes as needed for chest pain.     No current facility-administered medications for this visit.    Facility-Administered Medications Ordered in Other Visits  Medication Dose Route Frequency Provider Last Rate Last Dose  . iopamidol (ISOVUE-300) 61 % injection             REVIEW OF SYSTEMS:   Constitutional: Denies fevers, chills or abnormal night sweats Eyes: Denies blurriness of vision, double vision or watery eyes Ears, nose, mouth, throat, and face: Denies mucositis or sore throat Respiratory: Denies cough, dyspnea or wheezes Cardiovascular: Denies palpitation, chest discomfort or lower extremity swelling Gastrointestinal:  Denies nausea,  heartburn or change in bowel habits Skin: Denies abnormal skin rashes Lymphatics: Denies new lymphadenopathy or easy bruising Neurological:Denies numbness, tingling or new weaknesses Behavioral/Psych: Mood is stable, no new changes  Breast: Palpable lump in the breast All other systems were reviewed with the patient and are negative.  PHYSICAL EXAMINATION: ECOG PERFORMANCE STATUS: 1 - Symptomatic but completely ambulatory  Vitals:   07/24/16 1309  BP: (!) 142/85  Pulse: 68  Resp: 18  Temp: 98.3 F (36.8 C)   Filed Weights   07/24/16 1309  Weight: 163 lb 11.2 oz (74.3 kg)    GENERAL:alert, no distress and comfortable SKIN: skin color, texture, turgor are normal, no rashes or significant lesions EYES: normal,  conjunctiva are pink and non-injected, sclera clear OROPHARYNX:no exudate, no erythema and lips, buccal mucosa, and tongue normal  NECK: supple, thyroid normal size, non-tender, without nodularity LYMPH:  no palpable lymphadenopathy in the cervical, axillary or inguinal LUNGS: clear to auscultation and percussion with normal breathing effort HEART: regular rate & rhythm and no murmurs and no lower extremity edema ABDOMEN:abdomen soft, non-tender and normal bowel sounds Musculoskeletal:no cyanosis of digits and no clubbing  PSYCH: alert & oriented x 3 with fluent speech NEURO: no focal motor/sensory deficits BREAST: Palpable lump in the right breast and mildly enlarged lymph nodes. (exam performed in the presence of a chaperone)   LABORATORY DATA:  I have reviewed the data as listed Lab Results  Component Value Date   WBC 11.2 (H) 07/24/2016   HGB 14.7 07/24/2016   HCT 43.7 07/24/2016   MCV 91.0 07/24/2016   PLT 260 07/24/2016   Lab Results  Component Value Date   NA 145 07/24/2016   K 4.1 07/24/2016   CL 111 04/12/2016   CO2 24 07/24/2016    RADIOGRAPHIC STUDIES: I have personally reviewed the radiological reports and agreed with the findings in the  report.  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, estrogen receptor positive ([place]) 07/18/2016: Palpable right breast masses for 1 year; 2 adjacent spiculated masses by ultrasound at 1:00 subareolar 4.2 cm mass; 3 satellite nodules at 9:00; 2 abnormal lymph nodes; biopsy of the 2 masses and the lymph node showed similar-appearing grade 3 IDC ER 100% PR 95% Ki-67 20-30%, HER-2 negative ratio 1.57; T2 N1 stage IIB (AJCC 8)  Pathology and radiology counseling: Discussed with the patient, the details of pathology including the type of breast cancer,the clinical staging, the significance of ER, PR and HER-2/neu receptors and the implications for treatment. After reviewing the pathology in detail, we proceeded to discuss the different treatment options between surgery, radiation, chemotherapy, antiestrogen therapies.  Recommendation based on multidisciplinary tumor board: 1. Neoadjuvant chemotherapy with Taxotere and Cytoxan every 3 weeks 4 2. Followed by breast conserving surgery with sentinel lymph node study vs targeted axillary dissection 3. Followed by adjuvant radiation therapy  Because of patient previously received [facility] chemotherapy for lymphoma, she cannot get any more Adriamycin.  Chemotherapy Counseling: I discussed the risks and benefits of chemotherapy including the risks of nausea/ vomiting, risk of infection from low WBC count, fatigue due to chemo or anemia, bruising or bleeding due to low platelets, mouth sores, loss/ change in taste and decreased appetite. Liver and kidney function will be monitored through out chemotherapy as abnormalities in liver and kidney function may be a side effect of treatment. Risk of permanent bone marrow dysfunction due to chemo were also discussed.  Plan: 1. Port placement 2. Chemotherapy class 4. Breast MRI 5. Genetic counseling will also be arranged  Return to clinic in 2 weeks to start  chemotherapy.  All questions were answered. The patient knows to call the clinic with any problems, questions or concerns.    Rulon Eisenmenger, MD 07/24/16

## 2016-07-24 NOTE — Progress Notes (Signed)
Radiation Oncology         [phone]) [phone] ________________________________  Name: [name] MRN: [id number]  Date: 07/24/2016  DOB: 09-Sep-1970  MV:HQIONGE Lois Huxley, MD  Erroll Luna, MD     REFERRING PHYSICIAN: Erroll Luna, MD   DIAGNOSIS: The encounter diagnosis was Malignant neoplasm of overlapping sites of right breast in female, estrogen receptor positive ([place]).   HISTORY OF PRESENT ILLNESS: [name] is a 46 y.o. female seen in the multidisciplinary breast clinic for a new diagnosis of right breast cancer. 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 Su Grand. She's been NED from lymphoma since her treatments.  The patient was found to have a palpable mass that she noticed for about a year, she sought care and was supposed to be scheduled for mammography in the fall. She did not have this performed however until 07/12/16 when two masses were identified. Diagnostic imaging revealed a mass at 1:00 measuring 4.2 x 3.1 x 2.1 cm, and satellite nodules about 4.1 cm away from the mass at 9:00. 2 lymph nodes were suspicious in the axilla, and a biopsy of the primary mass, satellite nodule, and lymph node were performed on 07/18/16. All biopsies revealed grade 3 invasive ductal carcinoma, ER positive, HER-2 negative Ki-67 of 20-30%. She comes today to discuss the role of treatment for her breast cancer.   PREVIOUS RADIATION THERAPY: No   PAST MEDICAL HISTORY:  Past Medical History:  Diagnosis Date  . Asthma   . Bell's palsy   . Cardiomegaly   . GERD (gastroesophageal reflux disease)   . Hypertension   . Lymphoma of lymph nodes of neck ([place])   . OSA (obstructive sleep apnea)   . Renal artery stenosis ([facility])    s/p stent placment to left        PAST SURGICAL HISTORY: Past Surgical History:  Procedure Laterality Date  . CARDIAC CATHETERIZATION N/A  09/18/2015   Procedure: Left Heart Cath and Coronary Angiography;  Surgeon: Leonie Man, MD;  Location: [facility];  Service: Cardiovascular;  Laterality: N/A;  . [place]   x2  . PERIPHERAL VASCULAR CATHETERIZATION N/A 08/23/2014   Procedure: Renal Angiography;  Surgeon: Adrian Prows, MD;  Location: [facility];  Service: Cardiovascular;  Laterality: N/A;  . RENAL ARTERY STENT Left 08/23/2014  . SKIN BIOPSY  2000     FAMILY HISTORY:  Family History  Problem Relation Age of Onset  . Hypertension Mother   . Cancer Mother   . Coronary artery disease Father 39    CABG  . Asthma Father   . Migraines Neg Hx      SOCIAL HISTORY:  reports that she has been smoking Cigarettes.  She has a 3.75 pack-year smoking history. She has never used smokeless tobacco. She reports that she does not drink alcohol or use drugs. The patient is single in [place]. She is single and relocated from [place] in 2009. She is currently seeking employment.   ALLERGIES: Patient has no known allergies.   MEDICATIONS:  Current Outpatient Prescriptions  Medication Sig Dispense Refill  . albuterol (PROVENTIL) (2.5 MG/3ML) 0.083% nebulizer solution Take 3 mLs (2.5 mg total) by nebulization every 6 (six) hours as needed for wheezing or shortness of breath. 75 mL 12  . amLODipine (NORVASC) 10 MG tablet Take 10 mg by mouth  daily.    . aspirin 81 MG chewable tablet Chew 81 mg by mouth every morning.     Marland Kitchen atorvastatin (LIPITOR) 80 MG tablet Take 1 tablet (80 mg total) by mouth daily at 6 PM. 30 tablet 0  . benzonatate (TESSALON) 100 MG capsule Take 1 capsule (100 mg total) by mouth every 8 (eight) hours. 21 capsule 0  . clopidogrel (PLAVIX) 75 MG tablet Take 1 tablet (75 mg total) by mouth daily with breakfast. 30 tablet 1  . isosorbide mononitrate (IMDUR) 30 MG 24 hr tablet Take 1 tablet (30 mg total) by mouth daily. 30 tablet 0  . losartan (COZAAR) 50 MG tablet Take 50 mg by  mouth daily.  3  . metoprolol succinate (TOPROL XL) 100 MG 24 hr tablet Take 1 tablet (100 mg total) by mouth daily. Take with or immediately following a meal. 30 tablet 0  . mometasone-formoterol (DULERA) 200-5 MCG/ACT AERO Inhale 2 puffs into the lungs 2 (two) times daily. 1 Inhaler 1  . montelukast (SINGULAIR) 10 MG tablet Take 1 tablet (10 mg total) by mouth at bedtime. 30 tablet 0  . nitroGLYCERIN (NITROSTAT) 0.4 MG SL tablet Place 0.4 mg under the tongue every 5 (five) minutes as needed for chest pain.    . predniSONE (DELTASONE) 20 MG tablet Take 1 tablet (20 mg total) by mouth 2 (two) times daily with a meal. 10 tablet 0   No current facility-administered medications for this encounter.      REVIEW OF SYSTEMS: On review of systems, the patient reports that she is doing well overall. She denies any chest pain, shortness of breath, cough, fevers, chills, night sweats, unintended weight changes. She denies any bowel or bladder disturbances, and denies abdominal pain, nausea or vomiting. She denies any new musculoskeletal or joint aches or pains. A complete review of systems is obtained and is otherwise negative.     PHYSICAL EXAM:  Wt Readings from Last 3 Encounters:  04/12/16 154 lb (69.9 kg)  09/28/15 150 lb (68 kg)  09/20/15 161 lb 12.8 oz (73.4 kg)   Temp Readings from Last 3 Encounters:  04/12/16 98.5 F (36.9 C) (Oral)  09/20/15 97.9 F (36.6 C) (Oral)  08/25/14 98 F (36.7 C) (Oral)   BP Readings from Last 3 Encounters:  04/12/16 134/85  09/28/15 132/80  09/20/15 (!) 174/94   Pulse Readings from Last 3 Encounters:  04/12/16 90  09/28/15 80  09/20/15 (!) 59     In general this is a well appearing African-American female in in no acute distress. She is alert and oriented x4 and appropriate throughout the examination. HEENT reveals that the patient is normocephalic, atraumatic. EOMs are intact. PERRLA. Skin is intact without any evidence of gross lesions.  Cardiovascular exam reveals a regular rate and rhythm, no clicks rubs or murmurs are auscultated. Chest is clear to auscultation bilaterally. Lymphatic assessment is performed and does not reveal any adenopathy in the cervical, supraclavicular,  or inguinal chains. Bilateral breast exam is performed and reveals no palpable mass in the left breast. The right breast has a palpable mass retroareaolar that is about 3 cm and is tender to palpation. There are fixed nodes in the right axilla.  No nipple bleeding or discharge is noted of either breast, and no left axillary adenopathy is palpated. Abdomen has active bowel sounds in all quadrants and is intact. The abdomen is soft, non tender, non distended. Lower extremities are negative for pretibial pitting edema, deep calf tenderness,  cyanosis or clubbing.   ECOG = 1  0 - Asymptomatic (Fully active, able to carry on all predisease activities without restriction)  1 - Symptomatic but completely ambulatory (Restricted in physically strenuous activity but ambulatory and able to carry out work of a light or sedentary nature. For example, light housework, office work)  2 - Symptomatic, <50% in bed during the day (Ambulatory and capable of all self care but unable to carry out any work activities. Up and about more than 50% of waking hours)  3 - Symptomatic, >50% in bed, but not bedbound (Capable of only limited self-care, confined to bed or chair 50% or more of waking hours)  4 - Bedbound (Completely disabled. Cannot carry on any self-care. Totally confined to bed or chair)  5 - Death   Eustace Pen MM, Creech RH, Tormey DC, et al. [phone]). "Toxicity and response criteria of the [facility]". [place] Oncol. 5 (6): 649-55    LABORATORY DATA:  Lab Results  Component Value Date   WBC 9.0 04/12/2016   HGB 14.7 04/12/2016   HCT 42.9 04/12/2016   MCV 86.8 04/12/2016   PLT 209 04/12/2016   Lab Results  Component Value Date   NA 142  04/12/2016   K 3.5 04/12/2016   CL 111 04/12/2016   CO2 22 04/12/2016   Lab Results  Component Value Date   ALT 16 09/19/2015   AST 20 09/19/2015   ALKPHOS 116 09/19/2015   BILITOT 0.6 09/19/2015      RADIOGRAPHY: US Breast Ltd Uni Right Inc Axilla  Result Date: 07/12/2016 CLINICAL DATA:  Right subareolar breast palpable mass felt by the patient approximately 1 year ago. EXAM: 2D DIGITAL DIAGNOSTIC BILATERAL MAMMOGRAM WITH CAD AND ADJUNCT TOMO ULTRASOUND RIGHT BREAST ULTRASOUND LEFT AXILLA COMPARISON:  None available. ACR Breast Density Category b: There are scattered areas of fibroglandular density. FINDINGS: Mammographically, there are no suspicious masses, areas of architectural distortion or microcalcifications in the left breast. There is a prominent left axillary lymph node. Injectable port overlies the left axilla. In the right subareolar breast, there is a hyperdense spiculated mass, measuring 3.8 x 1.9 x 2.4 cm mammographically. Malignant type calcifications are seen associated with the mass. Two smaller satellite nodules are also seen in the right slightly upper outer quadrant, middle depth. There is skin retraction and periareolar skin thickening. Mammographic images were processed with CAD. On physical exam, there is a firm fixed mass in the subareolar right breast. There is skin dimpling and nipple retraction. There is darker discoloration of the nipple. Targeted right breast ultrasound is performed, showing highly suspicious irregular hypoechoic mass in the subareolar right 1 o'clock breast, which extends to the nipple. The mass measures approximately 4.2 by 2.1 by 3.1 cm. Interspersed microcalcifications are seen within. There are 3 satellite nodules in the right breast 9 o'clock in linear distribution, the farther of which is located 4.1 cm from the border of the index mass. The middle and largest satellite nodule measures 1.0 x 0.6 x 0.7 cm. Ultrasound examination of the right axilla  demonstrates 2 abnormal right axillary lymph nodes with cortical thickening of up to 8 mm. Ultrasound examination of the left axilla demonstrates benign-appearing lymph nodes. No evidence of left axillary lymphadenopathy. IMPRESSION: Highly suspicious calcifications containing right subareolar breast mass, involving the nipple and all 4 quadrants, measuring 4.2 x 2.1 x 3.1 cm sonographically. Three satellite nodules in linear distribution in the right 9 o'clock breast, the farthest of which  is located 4.1 cm from the lateral border of the index mass. Two adjacent abnormal right axillary lymph nodes. No evidence of left breast malignancy or left axillary lymphadenopathy. RECOMMENDATION: Ultrasound-guided core needle biopsy of the right subareolar breast mass, the farthest 9 o'clock satellite nodule, and an abnormal right axillary lymph node. Patient is on anticoagulation therapy, including Plavix and aspirin. Her physician Dr. Alphonzo Grieve recommends that the patient is taken off aspirin, but she should continue her Plavix at the time of her biopsy. I have discussed the findings and recommendations with the patient. Results were also provided in writing at the conclusion of the visit. If applicable, a reminder letter will be sent to the patient regarding the next appointment. BI-RADS CATEGORY  5: Highly suggestive of malignancy. Electronically Signed   By: Fidela Salisbury M.D.   On: 07/12/2016 16:22   Mm Diag Breast Tomo Bilateral  Result Date: 07/12/2016 CLINICAL DATA:  Right subareolar breast palpable mass felt by the patient approximately 1 year ago. EXAM: 2D DIGITAL DIAGNOSTIC BILATERAL MAMMOGRAM WITH CAD AND ADJUNCT TOMO ULTRASOUND RIGHT BREAST ULTRASOUND LEFT AXILLA COMPARISON:  None available. ACR Breast Density Category b: There are scattered areas of fibroglandular density. FINDINGS: Mammographically, there are no suspicious masses, areas of architectural distortion or microcalcifications in the left  breast. There is a prominent left axillary lymph node. Injectable port overlies the left axilla. In the right subareolar breast, there is a hyperdense spiculated mass, measuring 3.8 x 1.9 x 2.4 cm mammographically. Malignant type calcifications are seen associated with the mass. Two smaller satellite nodules are also seen in the right slightly upper outer quadrant, middle depth. There is skin retraction and periareolar skin thickening. Mammographic images were processed with CAD. On physical exam, there is a firm fixed mass in the subareolar right breast. There is skin dimpling and nipple retraction. There is darker discoloration of the nipple. Targeted right breast ultrasound is performed, showing highly suspicious irregular hypoechoic mass in the subareolar right 1 o'clock breast, which extends to the nipple. The mass measures approximately 4.2 by 2.1 by 3.1 cm. Interspersed microcalcifications are seen within. There are 3 satellite nodules in the right breast 9 o'clock in linear distribution, the farther of which is located 4.1 cm from the border of the index mass. The middle and largest satellite nodule measures 1.0 x 0.6 x 0.7 cm. Ultrasound examination of the right axilla demonstrates 2 abnormal right axillary lymph nodes with cortical thickening of up to 8 mm. Ultrasound examination of the left axilla demonstrates benign-appearing lymph nodes. No evidence of left axillary lymphadenopathy. IMPRESSION: Highly suspicious calcifications containing right subareolar breast mass, involving the nipple and all 4 quadrants, measuring 4.2 x 2.1 x 3.1 cm sonographically. Three satellite nodules in linear distribution in the right 9 o'clock breast, the farthest of which is located 4.1 cm from the lateral border of the index mass. Two adjacent abnormal right axillary lymph nodes. No evidence of left breast malignancy or left axillary lymphadenopathy. RECOMMENDATION: Ultrasound-guided core needle biopsy of the right  subareolar breast mass, the farthest 9 o'clock satellite nodule, and an abnormal right axillary lymph node. Patient is on anticoagulation therapy, including Plavix and aspirin. Her physician Dr. Alphonzo Grieve recommends that the patient is taken off aspirin, but she should continue her Plavix at the time of her biopsy. I have discussed the findings and recommendations with the patient. Results were also provided in writing at the conclusion of the visit. If applicable, a reminder letter will be sent to the  patient regarding the next appointment. BI-RADS CATEGORY  5: Highly suggestive of malignancy. Electronically Signed   By: Fidela Salisbury M.D.   On: 07/12/2016 16:22   [place] Axillary Node Core Biopsy Right  Addendum Date: 07/20/2016   ADDENDUM REPORT: 07/19/2016 16:01 ADDENDUM: Pathology revealed GRADE III INVASIVE DUCTAL CARCINOMA, LYMPH VASCULAR INVOLVEMENT of the Right breast, upper inner quadrant, retroareolar. GRADE III INVASIVE DUCTAL CARCINOMA, upper outer quadrant. 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 This was found to be concordant by Dr. Lajean Manes. Pathology results were discussed with the patient by telephone. The patient reported doing well after the biopsies with tenderness at the sites. Post biopsy instructions and care were reviewed and questions were answered. The patient was encouraged to call [facility] for any additional concerns. The patient was referred to [facility] on Jul 24, 2016. Pathology results reported by Terie Purser, RN on 07/19/2016. Electronically Signed   By: Lajean Manes M.D.   On: 07/19/2016 16:01   Result Date: 07/20/2016 CLINICAL DATA:  Patient presents for ultrasound-guided core needle biopsy of a large spiculated retroareolar, upper-outer quadrant mass, 1 of 3 smaller satellite masses projecting in the upper inner quadrant at 9 o'clock, and 1 of the  abnormal right axillary lymph nodes. EXAM: ULTRASOUND GUIDED RIGHT BREAST AND RIGHT AXILLARY CORE NEEDLE BIOPSY COMPARISON:  Previous exam(s). FINDINGS: I met with the patient and we discussed the procedure of ultrasound-guided biopsy, including benefits and alternatives. We discussed the high likelihood of a successful procedure. We discussed the risks of the procedure, including infection, bleeding, tissue injury, clip migration, and inadequate sampling. Informed written consent was given. The usual time-out protocol was performed immediately prior to the procedure. Lesion #1 quadrant: Upper inner quadrant, retroareolar. Using sterile technique and 1% Lidocaine as local anesthetic, under direct ultrasound visualization, a 12 gauge spring-loaded device was used to perform biopsy of the large retroareolar upper outer quadrant mass using an inferior approach. At the conclusion of the procedure a ribbon shaped tissue marker clip was deployed into the biopsy cavity. Follow up 2 view mammogram was performed and dictated separately. Lesion #2 quadrant: Upper outer quadrant Using sterile technique and 1% Lidocaine as local anesthetic, under direct ultrasound visualization, a 12 gauge spring-loaded device was used to perform biopsy of 1 of 3 adjacent lateral satellite masses near the 9 o'clock position using an inferior approach. At the conclusion of the procedure a coil shaped tissue marker clip was deployed into the biopsy cavity. Follow up 2 view mammogram was performed and dictated separately. Lesion #3 location: Right axilla Using sterile technique and 1% Lidocaine as local anesthetic, under direct ultrasound visualization, a 14 gauge spring-loaded device was used to perform biopsy of the largest abnormal right axillary lymph node using an inferior approach. At the conclusion of the procedure a HydroMARK tissue marker clip was deployed into the biopsy cavity. Follow up 2 view mammogram was performed and dictated  separately. IMPRESSION: Ultrasound guided biopsy of a large highly suspicious upper inner quadrant retroareolar right breast mass, moderate 3 smaller, lateral upper outer quadrant satellite right breast masses as well as enlarged abnormal appearing right axillary lymph node. No apparent complications. Electronically Signed: By: Lajean Manes M.D. On: 07/18/2016 13:59   [place] Axilla Left  Result Date: 07/12/2016 CLINICAL DATA:  Right subareolar breast palpable mass felt by the patient approximately 1 year ago. EXAM: 2D DIGITAL DIAGNOSTIC BILATERAL MAMMOGRAM WITH  CAD AND ADJUNCT TOMO ULTRASOUND RIGHT BREAST ULTRASOUND LEFT AXILLA COMPARISON:  None available. ACR Breast Density Category b: There are scattered areas of fibroglandular density. FINDINGS: Mammographically, there are no suspicious masses, areas of architectural distortion or microcalcifications in the left breast. There is a prominent left axillary lymph node. Injectable port overlies the left axilla. In the right subareolar breast, there is a hyperdense spiculated mass, measuring 3.8 x 1.9 x 2.4 cm mammographically. Malignant type calcifications are seen associated with the mass. Two smaller satellite nodules are also seen in the right slightly upper outer quadrant, middle depth. There is skin retraction and periareolar skin thickening. Mammographic images were processed with CAD. On physical exam, there is a firm fixed mass in the subareolar right breast. There is skin dimpling and nipple retraction. There is darker discoloration of the nipple. Targeted right breast ultrasound is performed, showing highly suspicious irregular hypoechoic mass in the subareolar right 1 o'clock breast, which extends to the nipple. The mass measures approximately 4.2 by 2.1 by 3.1 cm. Interspersed microcalcifications are seen within. There are 3 satellite nodules in the right breast 9 o'clock in linear distribution, the farther of which is located 4.1 cm from the border of  the index mass. The middle and largest satellite nodule measures 1.0 x 0.6 x 0.7 cm. Ultrasound examination of the right axilla demonstrates 2 abnormal right axillary lymph nodes with cortical thickening of up to 8 mm. Ultrasound examination of the left axilla demonstrates benign-appearing lymph nodes. No evidence of left axillary lymphadenopathy. IMPRESSION: Highly suspicious calcifications containing right subareolar breast mass, involving the nipple and all 4 quadrants, measuring 4.2 x 2.1 x 3.1 cm sonographically. Three satellite nodules in linear distribution in the right 9 o'clock breast, the farthest of which is located 4.1 cm from the lateral border of the index mass. Two adjacent abnormal right axillary lymph nodes. No evidence of left breast malignancy or left axillary lymphadenopathy. RECOMMENDATION: Ultrasound-guided core needle biopsy of the right subareolar breast mass, the farthest 9 o'clock satellite nodule, and an abnormal right axillary lymph node. Patient is on anticoagulation therapy, including Plavix and aspirin. Her physician Dr. Alphonzo Grieve recommends that the patient is taken off aspirin, but she should continue her Plavix at the time of her biopsy. I have discussed the findings and recommendations with the patient. Results were also provided in writing at the conclusion of the visit. If applicable, a reminder letter will be sent to the patient regarding the next appointment. BI-RADS CATEGORY  5: Highly suggestive of malignancy. Electronically Signed   By: Fidela Salisbury M.D.   On: 07/12/2016 16:22   Mm Clip Placement Right  Result Date: 07/18/2016 CLINICAL DATA:  Status post ultrasound-guided biopsy of large upper inner quadrant retroareolar breast mass, follow-up 1 of 3 smaller lateral, upper-outer quadrant, right breast satellite masses as well as an enlarged, abnormal right axillary lymph node. EXAM: DIAGNOSTIC RIGHT MAMMOGRAM POST ULTRASOUND BIOPSY COMPARISON:  Previous exam(s).  FINDINGS: Mammographic images were obtained following ultrasound guided biopsy of right breast masses and an abnormal right axillary lymph node. The ribbon shaped biopsy clip lies within the large retroareolar upper inner quadrant mass, the coil shaped biopsy clip lies laterally within the expected location of 1 of 3 smaller satellite masses, and the [facility] biopsy clip lies within an enlarged abnormal right axillary lymph node. IMPRESSION: Well-positioned biopsy clips following ultrasound-guided core needle biopsy of 2 right breast masses and an abnormal right axillary lymph node. Final Assessment: Post Procedure Mammograms for Marker  Placement Electronically Signed   By: Lajean Manes M.D.   On: 07/18/2016 14:12   [place] Rt Breast Bx W Loc Dev 1st Lesion Img Bx Spec US Guide  Addendum Date: 07/20/2016   ADDENDUM REPORT: 07/19/2016 16:01 ADDENDUM: Pathology revealed GRADE III INVASIVE DUCTAL CARCINOMA, LYMPH VASCULAR INVOLVEMENT of the Right breast, upper inner quadrant, retroareolar. GRADE III INVASIVE DUCTAL CARCINOMA, upper outer quadrant. 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 This was found to be concordant by Dr. Lajean Manes. Pathology results were discussed with the patient by telephone. The patient reported doing well after the biopsies with tenderness at the sites. Post biopsy instructions and care were reviewed and questions were answered. The patient was encouraged to call [facility] for any additional concerns. The patient was referred to [facility] at [facility] on Jul 24, 2016. Pathology results reported by Terie Purser, RN on 07/19/2016. Electronically Signed   By: Lajean Manes M.D.   On: 07/19/2016 16:01   Result Date: 07/20/2016 CLINICAL DATA:  Patient presents for ultrasound-guided core needle biopsy of a large spiculated retroareolar, upper-outer quadrant mass, 1 of 3 smaller satellite  masses projecting in the upper inner quadrant at 9 o'clock, and 1 of the abnormal right axillary lymph nodes. EXAM: ULTRASOUND GUIDED RIGHT BREAST AND RIGHT AXILLARY CORE NEEDLE BIOPSY COMPARISON:  Previous exam(s). FINDINGS: I met with the patient and we discussed the procedure of ultrasound-guided biopsy, including benefits and alternatives. We discussed the high likelihood of a successful procedure. We discussed the risks of the procedure, including infection, bleeding, tissue injury, clip migration, and inadequate sampling. Informed written consent was given. The usual time-out protocol was performed immediately prior to the procedure. Lesion #1 quadrant: Upper inner quadrant, retroareolar. Using sterile technique and 1% Lidocaine as local anesthetic, under direct ultrasound visualization, a 12 gauge spring-loaded device was used to perform biopsy of the large retroareolar upper outer quadrant mass using an inferior approach. At the conclusion of the procedure a ribbon shaped tissue marker clip was deployed into the biopsy cavity. Follow up 2 view mammogram was performed and dictated separately. Lesion #2 quadrant: Upper outer quadrant Using sterile technique and 1% Lidocaine as local anesthetic, under direct ultrasound visualization, a 12 gauge spring-loaded device was used to perform biopsy of 1 of 3 adjacent lateral satellite masses near the 9 o'clock position using an inferior approach. At the conclusion of the procedure a coil shaped tissue marker clip was deployed into the biopsy cavity. Follow up 2 view mammogram was performed and dictated separately. Lesion #3 location: Right axilla Using sterile technique and 1% Lidocaine as local anesthetic, under direct ultrasound visualization, a 14 gauge spring-loaded device was used to perform biopsy of the largest abnormal right axillary lymph node using an inferior approach. At the conclusion of the procedure a HydroMARK tissue marker clip was deployed into the  biopsy cavity. Follow up 2 view mammogram was performed and dictated separately. IMPRESSION: Ultrasound guided biopsy of a large highly suspicious upper inner quadrant retroareolar right breast mass, moderate 3 smaller, lateral upper outer quadrant satellite right breast masses as well as enlarged abnormal appearing right axillary lymph node. No apparent complications. Electronically Signed: By: Lajean Manes M.D. On: 07/18/2016 13:59   [place] Rt Breast Bx W Loc Dev Ea Add Lesion Img Bx Spec US Guide  Addendum Date: 07/20/2016   ADDENDUM REPORT: 07/19/2016 16:01 ADDENDUM: Pathology revealed GRADE III INVASIVE DUCTAL CARCINOMA, LYMPH  VASCULAR INVOLVEMENT of the Right breast, upper inner quadrant, retroareolar. GRADE III INVASIVE DUCTAL CARCINOMA, upper outer quadrant. 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 This was found to be concordant by Dr. Lajean Manes. Pathology results were discussed with the patient by telephone. The patient reported doing well after the biopsies with tenderness at the sites. Post biopsy instructions and care were reviewed and questions were answered. The patient was encouraged to call [facility] for any additional concerns. The patient was referred to [facility] on Jul 24, 2016. Pathology results reported by Terie Purser, RN on 07/19/2016. Electronically Signed   By: Lajean Manes M.D.   On: 07/19/2016 16:01   Result Date: 07/20/2016 CLINICAL DATA:  Patient presents for ultrasound-guided core needle biopsy of a large spiculated retroareolar, upper-outer quadrant mass, 1 of 3 smaller satellite masses projecting in the upper inner quadrant at 9 o'clock, and 1 of the abnormal right axillary lymph nodes. EXAM: ULTRASOUND GUIDED RIGHT BREAST AND RIGHT AXILLARY CORE NEEDLE BIOPSY COMPARISON:  Previous exam(s). FINDINGS: I met with the patient and we discussed the procedure of  ultrasound-guided biopsy, including benefits and alternatives. We discussed the high likelihood of a successful procedure. We discussed the risks of the procedure, including infection, bleeding, tissue injury, clip migration, and inadequate sampling. Informed written consent was given. The usual time-out protocol was performed immediately prior to the procedure. Lesion #1 quadrant: Upper inner quadrant, retroareolar. Using sterile technique and 1% Lidocaine as local anesthetic, under direct ultrasound visualization, a 12 gauge spring-loaded device was used to perform biopsy of the large retroareolar upper outer quadrant mass using an inferior approach. At the conclusion of the procedure a ribbon shaped tissue marker clip was deployed into the biopsy cavity. Follow up 2 view mammogram was performed and dictated separately. Lesion #2 quadrant: Upper outer quadrant Using sterile technique and 1% Lidocaine as local anesthetic, under direct ultrasound visualization, a 12 gauge spring-loaded device was used to perform biopsy of 1 of 3 adjacent lateral satellite masses near the 9 o'clock position using an inferior approach. At the conclusion of the procedure a coil shaped tissue marker clip was deployed into the biopsy cavity. Follow up 2 view mammogram was performed and dictated separately. Lesion #3 location: Right axilla Using sterile technique and 1% Lidocaine as local anesthetic, under direct ultrasound visualization, a 14 gauge spring-loaded device was used to perform biopsy of the largest abnormal right axillary lymph node using an inferior approach. At the conclusion of the procedure a HydroMARK tissue marker clip was deployed into the biopsy cavity. Follow up 2 view mammogram was performed and dictated separately. IMPRESSION: Ultrasound guided biopsy of a large highly suspicious upper inner quadrant retroareolar right breast mass, moderate 3 smaller, lateral upper outer quadrant satellite right breast masses as  well as enlarged abnormal appearing right axillary lymph node. No apparent complications. Electronically Signed: By: Lajean Manes M.D. On: 07/18/2016 13:59       IMPRESSION/PLAN: 1. Stage IIB, T2, N1, Mx grade 3, ER/PR positive invasive ductal carcinoma of the right breast. Dr. Lisbeth Renshaw discusses the pathology findings and reviews the nature of invasive locally advanced ductal  disease. The consensus from the breast conference recommended neoadjuvant chemotherapy with staging MRI, Port-A-Cath, and genetic counseling. Surgery with  breast conservation is anticipated, followed by external radiotherapy to the breast followed by antiestrogen therapy. We discussed the risks, benefits, short, and long term effects of  radiotherapy, and the patient is interested in proceeding. Dr. Lisbeth Renshaw discusses the delivery and logistics of radiotherapy. We will see her back about 2 weeks after surgery to move forward with the simulation and planning process and anticipate starting radiotherapy about 4 weeks after surgery.  2. Possible genetic predisposition to malignancy. Given the patient's age at diagnosis, she's been encouraged to proceed with genetic counseling. She will be referred for evaluation.  3. IV access. The patient sill has her left chest PAC which was placed in 2000. It hasn't been accessed since 2005 to her recollection. Dr. Brantley Stage and Dr. Lindi Adie will discuss options for trying to use this and determine if it is still functional.  The above documentation reflects my direct findings during this shared patient visit. Please see the separate note by Dr. Lisbeth Renshaw on this date for the remainder of the patient's plan of care.    Carola Rhine, PAC

## 2016-07-24 NOTE — Therapy (Signed)
[place], [place], [address] Phone: [phone]   Fax:  [phone]  Physical Therapy Evaluation  Patient Details  Name: [name] MRN: [id number] Date of Birth: [birth year] Referring Provider: Dr. Erroll Luna  Encounter Date: 07/24/2016      PT End of Session - 07/24/16 1616    Visit Number 1   Number of Visits 1   PT Start Time 1450   PT Stop Time 1510   PT Time Calculation (min) 20 min   Activity Tolerance Patient tolerated treatment well   Behavior During Therapy [facility] for tasks assessed/performed      Past Medical History:  Diagnosis Date  . Asthma   . Bell's palsy   . Cardiomegaly   . GERD (gastroesophageal reflux disease)   . Hypertension   . Lymphoma of lymph nodes of neck ([place])   . OSA (obstructive sleep apnea)   . Renal artery stenosis ([facility])    s/p stent placment to left     Past Surgical History:  Procedure Laterality Date  . CARDIAC CATHETERIZATION N/A 09/18/2015   Procedure: Left Heart Cath and Coronary Angiography;  Surgeon: Leonie Man, MD;  Location: [place];  Service: Cardiovascular;  Laterality: N/A;  . [place]   x2  . PERIPHERAL VASCULAR CATHETERIZATION N/A 08/23/2014   Procedure: Renal Angiography;  Surgeon: Adrian Prows, MD;  Location: [place] [facility];  Service: Cardiovascular;  Laterality: N/A;  . RENAL ARTERY STENT Left 08/23/2014  . SKIN BIOPSY  2000    There were no vitals filed for this visit.       Subjective Assessment - 07/24/16 1609    Subjective Patient reports she is here to be seen by her medical team for her newly diagnosed right breast cancer.   Pertinent History Patient was diagnosed on 07/12/16 with right invasive ductal carcinoma breast cancer. Thre is a mass measuring 4.2 cm in the upper inner quadrant with 3 satellite lesions located in the upper outer quadrant. It is ER/PR positive and HER2 negative with a  Ki67 of 20-30%. She has a known positive biopsied axillary lymph node. She has no other medical problems.   Patient Stated Goals Reduce lymphedema risk and learn post op shoulder ROM HEP   Currently in Pain? Yes   Pain Score 9    Pain Location Arm   Pain Orientation Right;Upper   Pain Descriptors / Indicators Sharp   Pain Type Acute pain   Pain Onset In the past 7 days   Pain Frequency Intermittent   Aggravating Factors  Reaching out in abduction    Pain Relieving Factors Resting arm            [facility] PT Assessment - 07/24/16 0001      Assessment   Medical Diagnosis Right breast cancer   Referring Provider Dr. Marcello Moores [place]   Onset Date/Surgical Date 07/12/16   Hand Dominance Right   Prior Therapy none     Precautions   Precautions Other (comment)   Precaution Comments active cancer     Restrictions   Weight Bearing Restrictions No     Balance Screen   Has the patient fallen in the past 6 months No   Has the patient had a decrease in activity level because of a fear of falling?  No   Is the patient reluctant to leave their home because of a fear of falling?  No  Home Environment   Living Environment Private residence   Living Arrangements Spouse/significant other;Children;Other relatives  Husband, 2 adult children, 31 & 34 y.o. grandchildren and 23 y   Available Help at Discharge Family     Prior Function   Level of Independence Independent   Vocation Unemployed   Vocation Requirements Looking for work   Leisure She does not exercise     Cognition   Overall Cognitive Status Within Functional Limits for tasks assessed     Posture/Postural Control   Posture/Postural Control Postural limitations   Postural Limitations Rounded Shoulders;Forward head     ROM / Strength   AROM / PROM / Strength AROM;Strength     AROM   AROM Assessment Site Shoulder;Cervical   Right/Left Shoulder Right;Left   Right Shoulder Extension 37 Degrees   Right Shoulder Flexion 134  Degrees   Right Shoulder ABduction 147 Degrees   Right Shoulder Internal Rotation 55 Degrees   Right Shoulder External Rotation 83 Degrees   Left Shoulder Extension 45 Degrees   Left Shoulder Flexion 127 Degrees   Left Shoulder ABduction 153 Degrees   Left Shoulder Internal Rotation 71 Degrees   Left Shoulder External Rotation 80 Degrees   Cervical Flexion WNl   Cervical Extension WNL   Cervical - Right Side Bend WNL   Cervical - Left Side Bend WNL   Cervical - Right Rotation WNL   Cervical - Left Rotation WNL     Strength   Overall Strength Within functional limits for tasks performed           LYMPHEDEMA/ONCOLOGY QUESTIONNAIRE - 07/24/16 1614      Type   Cancer Type Right breast cancer     Lymphedema Assessments   Lymphedema Assessments Upper extremities     Right Upper Extremity Lymphedema   10 cm Proximal to Olecranon Process 30.6 cm   Olecranon Process 26.1 cm   10 cm Proximal to Ulnar Styloid Process 22.7 cm   Just Proximal to Ulnar Styloid Process 15.9 cm   Across Hand at [employer] 19.2 cm   At [place] of 2nd Digit 6.4 cm     Left Upper Extremity Lymphedema   10 cm Proximal to Olecranon Process 30.7 cm   Olecranon Process 27.1 cm   10 cm Proximal to Ulnar Styloid Process 22.1 cm   Just Proximal to Ulnar Styloid Process 15.8 cm   Across Hand at [employer] 19.1 cm   At [place] of 2nd Digit 6.1 cm           Patient was instructed today in a home exercise program today for post op shoulder range of motion. These included active assist shoulder flexion in sitting, scapular retraction, wall walking with shoulder abduction, and hands behind head external rotation.  She was encouraged to do these twice a day, holding 3 seconds and repeating 5 times when permitted by her physician.                 PT Education - 07/24/16 1616    Education provided Yes   Education Details Lymphedema risk reduction and post op shoulder ROM HEP   Person(s)  Educated Patient;Child(ren)   Methods Explanation;Demonstration;Handout   Comprehension Returned demonstration;Verbalized understanding              Breast Clinic Goals - 07/24/16 1619      Patient will be able to verbalize understanding of pertinent lymphedema risk reduction practices relevant to her diagnosis specifically related to skin  care.   Time 1   Period Days   Status Achieved     Patient will be able to return demonstrate and/or verbalize understanding of the post-op home exercise program related to regaining shoulder range of motion.   Time 1   Period Days   Status Achieved     Patient will be able to verbalize understanding of the importance of attending the postoperative After Breast Cancer Class for further lymphedema risk reduction education and therapeutic exercise.   Time 1   Period Days   Status Achieved              Plan - 07/24/16 1616    Clinical Impression Statement Patient was diagnosed on 07/12/16 with right invasive ductal carcinoma breast cancer. Thre is a mass measuring 4.2 cm in the upper inner quadrant with 3 satellite lesions located in the upper outer quadrant. It is ER/PR positive and HER2 negative with a Ki67 of 20-30%. She has a known positive biopsied axillary lymph node. She has no other medical problems. Her multidisciplinary medical team met prior to her assessments to determine a recommended treatment plan. She is planning to have neoadjuvant chemotherapy followed by either a right lumpectomy or mastectomy with a targeted axillary node dissection, radiation, and anti-estrogen therapy. She will benefit from post op PT to regain shoulder ROM and reduce lymphedema risk. Due to her lack of comorbidities, her eval is of low complexity.   Rehab Potential Excellent   Clinical Impairments Affecting Rehab Potential none   PT Frequency One time visit   PT Treatment/Interventions Patient/family education;Therapeutic exercise   PT Next Visit Plan  Will f/u after surgery to detemine PT needs   PT Home Exercise Plan Post op shoulder ROM HEP   Consulted and Agree with Plan of Care Patient;Family member/caregiver   Family Member Consulted Daughter      Patient will benefit from skilled therapeutic intervention in order to improve the following deficits and impairments:  Postural dysfunction, Decreased knowledge of precautions, Pain, Impaired UE functional use, Decreased range of motion  Visit Diagnosis: Carcinoma of upper-inner quadrant of right breast in female, estrogen receptor positive ([place]) - Plan: PT plan of care cert/re-cert  Abnormal posture - Plan: PT plan of care cert/re-cert   Patient will follow up at outpatient cancer rehab if needed following surgery.  If the patient requires physical therapy at that time, a specific plan will be dictated and sent to the referring physician for approval. The patient was educated today on appropriate basic range of motion exercises to begin post operatively and the importance of attending the After Breast Cancer class following surgery.  Patient was educated today on lymphedema risk reduction practices as it pertains to recommendations that will benefit the patient immediately following surgery.  She verbalized good understanding.  No additional physical therapy is indicated at this time.      Problem List Patient Active Problem List   Diagnosis Date Noted  . Malignant neoplasm of overlapping sites of right breast in female, estrogen receptor positive ([place]) 07/23/2016  . CAD at cath 09/18/15 09/28/2015  . Chest pain at rest 09/18/2015  . Hypertensive urgency 09/18/2015  . NSTEMI (non-ST elevated myocardial infarction) ([place]) 09/18/2015  . Hypokalemia 09/18/2015  . Asthma 09/18/2015  . History of lymphoma 09/18/2015  . Hypertensive emergency without congestive heart failure   . Left renal artery stenosis ([place]) 08/23/2014  . 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 Headache above the eye  region 08/16/2014  . Blurry vision  08/16/2014  . Perceived hearing changes 08/16/2014  . Snoring 08/16/2014  . Witnessed apneic spells 08/16/2014  . Excessive daytime sleepiness 08/16/2014  . Nocturnal headaches 08/16/2014    Annia Friendly, PT 07/24/16 4:21 PM  [place], [place], [address] Phone: [phone]   Fax:  [phone]  Name: [name] MRN: [id number] Date of Birth: November 16, 1970

## 2016-07-25 ENCOUNTER — Encounter: Payer: Self-pay | Admitting: General Practice

## 2016-07-25 ENCOUNTER — Encounter: Payer: Self-pay | Admitting: Hematology and Oncology

## 2016-07-25 NOTE — Progress Notes (Signed)
Received message from [name] regarding patient with financial concerns and disability. Referred to social workers for disability questions. Called patient to discuss [employer] and what it covers. Patient currently has no income and is supported by her fiance'. Patient will get him to write a letter of support. Asked patient if she can bring when she comes on 08/01/16 to meet with me after chemo ed class to apply for grant. Patient states she can. Advised patient to let the RN know that she is meeting with me after class. Patient verbalized understanding and has my name and number for any additional financial questions or concerns.

## 2016-07-25 NOTE — Progress Notes (Signed)
[place] Psychosocial Distress Screening [place] by phone following Breast Multidisciplinary Clinic to introduce [place] team/resources, reviewing distress screen per protocol.  The patient scored a 0 on the Psychosocial Distress Thermometer which indicates minimal distress. Also assessed for distress and other psychosocial needs.   ONCBCN DISTRESS SCREENING 07/25/2016  Screening Type Initial Screening  Distress experienced in past week (1-10) 0  Referral to support programs Yes   Per Kieth Brightly, finances are her main concern. She is interested in massage therapy and plans to review [place] packet for details and other resources.  Follow up needed: No. Referring to Financial Advocate for questions related to disability application/financial resources. Per pt, no other needs at this time. She has full packet of [place] and knows to call anytime with questions/needs.   [place], [place], [facility] Pager [phone] Voicemail [phone]

## 2016-07-26 ENCOUNTER — Encounter: Payer: Self-pay | Admitting: *Deleted

## 2016-07-26 NOTE — Progress Notes (Signed)
[place] Work  Clinical Social Work was referred by Development worker, community for assessment of psychosocial needs, education re disability.  Clinical Social Worker contacted patient at home to offer support and assess for needs. CSW introduced self, explained role of CSW/Pt and Family Support Team, support groups and other resources to assist. CSW educated pt on ss disability eligibility guidelines. Based on her current cancer stage, she does not qualify. CSW reviewed other options for support. Pt plans to contact Cancer Care and follow up with CSW for application completion. She agrees to reach out as needed and denied other needs.     Clinical Social Work interventions:  Resource education and referral Loren Racer, LCSW, OSW-C Clinical Social Worker [place]  [place] Phone: [phone] Fax: [phone]

## 2016-07-29 ENCOUNTER — Telehealth: Payer: Self-pay | Admitting: *Deleted

## 2016-07-29 ENCOUNTER — Telehealth: Payer: Self-pay | Admitting: Hematology and Oncology

## 2016-07-29 ENCOUNTER — Ambulatory Visit (HOSPITAL_COMMUNITY): Admission: RE | Admit: 2016-07-29 | Payer: Medicaid Other | Source: Ambulatory Visit

## 2016-07-29 NOTE — Telephone Encounter (Signed)
sw pt to confirm upcomming May appts per LOS

## 2016-07-29 NOTE — Telephone Encounter (Signed)
Spoke with pt concerning [place] from 07/24/16. Denies questions or concerns regarding dx or treatment care plan. Encourage pt to call with needs. Received verbal understanding.

## 2016-07-30 ENCOUNTER — Encounter: Payer: Self-pay | Admitting: *Deleted

## 2016-08-01 ENCOUNTER — Other Ambulatory Visit: Payer: Medicaid Other

## 2016-08-02 ENCOUNTER — Ambulatory Visit (HOSPITAL_COMMUNITY)
Admission: RE | Admit: 2016-08-02 | Discharge: 2016-08-02 | Disposition: A | Discharge status: Home / Self Care | Payer: Medicaid Other | Source: Ambulatory Visit | Attending: Hematology and Oncology | Admitting: Hematology and Oncology

## 2016-08-02 DIAGNOSIS — C50911 Malignant neoplasm of unspecified site of right female breast: Secondary | ICD-10-CM | POA: Diagnosis present

## 2016-08-02 DIAGNOSIS — Z170 Estrogen receptor positive status [ER+]: Secondary | ICD-10-CM | POA: Diagnosis not present

## 2016-08-02 DIAGNOSIS — C50211 Malignant neoplasm of upper-inner quadrant of right female breast: Secondary | ICD-10-CM | POA: Diagnosis not present

## 2016-08-02 DIAGNOSIS — R590 Localized enlarged lymph nodes: Secondary | ICD-10-CM | POA: Insufficient documentation

## 2016-08-02 MED ORDER — GADOBENATE DIMEGLUMINE 529 MG/ML IV SOLN
15.0000 mL | Freq: Once | INTRAVENOUS | Status: AC | PRN
  Administered 2016-08-02: 15 mL via INTRAVENOUS

## 2016-08-06 ENCOUNTER — Telehealth: Payer: Self-pay | Admitting: *Deleted

## 2016-08-06 ENCOUNTER — Other Ambulatory Visit: Payer: Medicaid Other

## 2016-08-06 NOTE — Telephone Encounter (Signed)
Patient states she is not going to come to chemo class. Will keep her app't on 5/18 she states.

## 2016-08-08 NOTE — Assessment & Plan Note (Signed)
07/18/2016: Palpable right breast masses for 1 year; 2 adjacent spiculated masses by ultrasound at 1:00 subareolar 4.2 cm mass; 3 satellite nodules at 9:00; 2 abnormal lymph nodes; biopsy of the 2 masses and the lymph node showed similar-appearing grade 3 IDC ER 100% PR 95% Ki-67 20-30%, HER-2 negative ratio 1.57; T2 N1 stage IIB (AJCC 8)  Pathology and radiology counseling: Discussed with the patient, the details of pathology including the type of breast cancer,the clinical staging, the significance of ER, PR and HER-2/neu receptors and the implications for treatment. After reviewing the pathology in detail, we proceeded to discuss the different treatment options between surgery, radiation, chemotherapy, antiestrogen therapies.  Recommendation based on multidisciplinary tumor board: 1. Neoadjuvant chemotherapy with Taxotere and Cytoxan every 3 weeks 4 2. Followed by breast conserving surgery with sentinel lymph node study vs targeted axillary dissection 3. Followed by adjuvant radiation therapy  Because of patient previously received [facility] chemotherapy for lymphoma, she cannot get any more Adriamycin. -------------------------------------------------------------------------------- Current treatment: Cycle 1 Taxotere Cytoxan Anti emetics were discussed Labs reviewed RTC in 1 week for tox check

## 2016-08-09 ENCOUNTER — Ambulatory Visit (HOSPITAL_BASED_OUTPATIENT_CLINIC_OR_DEPARTMENT_OTHER): Payer: Medicaid Other

## 2016-08-09 ENCOUNTER — Ambulatory Visit (HOSPITAL_BASED_OUTPATIENT_CLINIC_OR_DEPARTMENT_OTHER): Payer: Medicaid Other | Admitting: Hematology and Oncology

## 2016-08-09 ENCOUNTER — Encounter: Payer: Self-pay | Admitting: Hematology and Oncology

## 2016-08-09 ENCOUNTER — Encounter: Payer: Self-pay | Admitting: *Deleted

## 2016-08-09 ENCOUNTER — Ambulatory Visit (HOSPITAL_COMMUNITY): Admission: RE | Admit: 2016-08-09 | Payer: Medicaid Other | Source: Ambulatory Visit

## 2016-08-09 VITALS — BP 108/61 | HR 96 | Temp 98.0°F | Resp 18

## 2016-08-09 DIAGNOSIS — Z5189 Encounter for other specified aftercare: Secondary | ICD-10-CM

## 2016-08-09 DIAGNOSIS — Z170 Estrogen receptor positive status [ER+]: Principal | ICD-10-CM

## 2016-08-09 DIAGNOSIS — Z5111 Encounter for antineoplastic chemotherapy: Secondary | ICD-10-CM

## 2016-08-09 DIAGNOSIS — C50811 Malignant neoplasm of overlapping sites of right female breast: Secondary | ICD-10-CM

## 2016-08-09 LAB — COMPREHENSIVE METABOLIC PANEL
ALBUMIN: 4.2 g/dL (ref 3.5–5.0)
ALT: 12 U/L (ref 0–55)
AST: 12 U/L (ref 5–34)
Alkaline Phosphatase: 118 U/L (ref 40–150)
Anion Gap: 14 mEq/L — ABNORMAL HIGH (ref 3–11)
BILIRUBIN TOTAL: 0.29 mg/dL (ref 0.20–1.20)
BUN: 6.4 mg/dL — ABNORMAL LOW (ref 7.0–26.0)
CALCIUM: 10.2 mg/dL (ref 8.4–10.4)
CO2: 21 meq/L — AB (ref 22–29)
CREATININE: 0.8 mg/dL (ref 0.6–1.1)
Chloride: 109 mEq/L (ref 98–109)
EGFR: >90 mL/min/{1.73_m2} (ref >90)
Glucose: 80 mg/dl (ref 70–140)
Potassium: 3.4 mEq/L — ABNORMAL LOW (ref 3.5–5.1)
Sodium: 144 mEq/L (ref 136–145)
TOTAL PROTEIN: 7.6 g/dL (ref 6.4–8.3)

## 2016-08-09 LAB — CBC WITH DIFFERENTIAL/PLATELET
BASO%: 0.1 % (ref 0.0–2.0)
Basophils Absolute: 0 10*3/uL (ref 0.0–0.1)
EOS ABS: 0.1 10*3/uL (ref 0.0–0.5)
EOS%: 0.5 % (ref 0.0–7.0)
HEMATOCRIT: 43.3 % (ref 34.8–46.6)
HGB: 14.7 g/dL (ref 11.6–15.9)
LYMPH%: 37.3 % (ref 14.0–49.7)
MCH: 30.9 pg (ref 25.1–34.0)
MCHC: 33.9 g/dL (ref 31.5–36.0)
MCV: 91 fL (ref 79.5–101.0)
MONO#: 1 10*3/uL — ABNORMAL HIGH (ref 0.1–0.9)
MONO%: 6.3 % (ref 0.0–14.0)
NEUT%: 55.8 % (ref 38.4–76.8)
NEUTROS ABS: 8.6 10*3/uL — AB (ref 1.5–6.5)
NRBC: 0 % (ref 0–0)
PLATELETS: 246 10*3/uL (ref 145–400)
RBC: 4.76 10*6/uL (ref 3.70–5.45)
RDW: 13.4 % (ref 11.2–14.5)
WBC: 15.3 10*3/uL — AB (ref 3.9–10.3)
lymph#: 5.7 10*3/uL — ABNORMAL HIGH (ref 0.9–3.3)

## 2016-08-09 MED ORDER — DEXAMETHASONE SODIUM PHOSPHATE 10 MG/ML IJ SOLN
10.0000 mg | Freq: Once | INTRAMUSCULAR | Status: AC
  Administered 2016-08-09: 10 mg via INTRAVENOUS

## 2016-08-09 MED ORDER — PEGFILGRASTIM 6 MG/0.6ML ~~LOC~~ PSKT
6.0000 mg | PREFILLED_SYRINGE | Freq: Once | SUBCUTANEOUS | Status: AC
  Administered 2016-08-09: 6 mg via SUBCUTANEOUS
  Filled 2016-08-09: qty 0.6

## 2016-08-09 MED ORDER — SODIUM CHLORIDE 0.9 % IV SOLN
600.0000 mg/m2 | Freq: Once | INTRAVENOUS | Status: AC
  Administered 2016-08-09: 1100 mg via INTRAVENOUS
  Filled 2016-08-09: qty 55

## 2016-08-09 MED ORDER — HEPARIN SOD (PORK) LOCK FLUSH 100 UNIT/ML IV SOLN
500.0000 [IU] | Freq: Once | INTRAVENOUS | Status: AC | PRN
  Administered 2016-08-09: 500 [IU]
  Filled 2016-08-09: qty 5

## 2016-08-09 MED ORDER — DEXAMETHASONE SODIUM PHOSPHATE 10 MG/ML IJ SOLN
INTRAMUSCULAR | Status: AC
  Filled 2016-08-09: qty 1

## 2016-08-09 MED ORDER — PALONOSETRON HCL INJECTION 0.25 MG/5ML
INTRAVENOUS | Status: AC
  Filled 2016-08-09: qty 5

## 2016-08-09 MED ORDER — SODIUM CHLORIDE 0.9% FLUSH
10.0000 mL | INTRAVENOUS | Status: DC | PRN
  Administered 2016-08-09: 10 mL
  Filled 2016-08-09: qty 10

## 2016-08-09 MED ORDER — SODIUM CHLORIDE 0.9 % IV SOLN
Freq: Once | INTRAVENOUS | Status: AC
  Administered 2016-08-09: 11:00:00 via INTRAVENOUS

## 2016-08-09 MED ORDER — PALONOSETRON HCL INJECTION 0.25 MG/5ML
0.2500 mg | Freq: Once | INTRAVENOUS | Status: AC
  Administered 2016-08-09: 0.25 mg via INTRAVENOUS

## 2016-08-09 MED ORDER — DOCETAXEL CHEMO INJECTION 160 MG/16ML
75.0000 mg/m2 | Freq: Once | INTRAVENOUS | Status: AC
  Administered 2016-08-09: 140 mg via INTRAVENOUS
  Filled 2016-08-09: qty 14

## 2016-08-09 NOTE — Patient Instructions (Signed)
[facility] Discharge Instructions for Patients Receiving Chemotherapy  Today you received the following chemotherapy agents Taxotere and Cytoxan  To help prevent nausea and vomiting after your treatment, we encourage you to take your nausea medication as directed by your doctor.  If you develop nausea and vomiting that is not controlled by your nausea medication, call the clinic.   BELOW ARE SYMPTOMS THAT SHOULD BE REPORTED IMMEDIATELY:  *FEVER GREATER THAN 100.5 F  *CHILLS WITH OR WITHOUT FEVER  NAUSEA AND VOMITING THAT IS NOT CONTROLLED WITH YOUR NAUSEA MEDICATION  *UNUSUAL SHORTNESS OF BREATH  *UNUSUAL BRUISING OR BLEEDING  TENDERNESS IN MOUTH AND THROAT WITH OR WITHOUT PRESENCE OF ULCERS  *URINARY PROBLEMS  *BOWEL PROBLEMS  UNUSUAL RASH Items with * indicate a potential emergency and should be followed up as soon as possible.  Feel free to call the clinic you have any questions or concerns. The clinic phone number is [phone].  Please show the [facility] at check-in to the Emergency Department and triage nurse.   Docetaxel injection TAXOTERE What is this medicine? DOCETAXEL (doe se TAX el) is a chemotherapy drug. It targets fast dividing cells, like cancer cells, and causes these cells to die. This medicine is used to treat many types of cancers like breast cancer, certain stomach cancers, head and neck cancer, lung cancer, and prostate cancer. This medicine may be used for other purposes; ask your health care provider or pharmacist if you have questions. COMMON BRAND NAME(S): Docefrez, Taxotere What should I tell my health care provider before I take this medicine? They need to know if you have any of these conditions: -infection (especially a virus infection such as chickenpox, cold sores, or herpes) -liver disease -low blood counts, like low white cell, platelet, or red cell counts -an unusual or allergic reaction to docetaxel,  polysorbate 80, other chemotherapy agents, other medicines, foods, dyes, or preservatives -pregnant or trying to get pregnant -breast-feeding How should I use this medicine? This drug is given as an infusion into a vein. It is administered in a hospital or clinic by a specially trained health care professional. Talk to your pediatrician regarding the use of this medicine in children. Special care may be needed. Overdosage: If you think you have taken too much of this medicine contact a poison control center or emergency room at once. NOTE: This medicine is only for you. Do not share this medicine with others. What if I miss a dose? It is important not to miss your dose. Call your doctor or health care professional if you are unable to keep an appointment. What may interact with this medicine? -cyclosporine -erythromycin -ketoconazole -medicines to increase blood counts like filgrastim, pegfilgrastim, sargramostim -vaccines Talk to your doctor or health care professional before taking any of these medicines: -acetaminophen -aspirin -ibuprofen -ketoprofen -naproxen This list may not describe all possible interactions. Give your health care provider a list of all the medicines, herbs, non-prescription drugs, or dietary supplements you use. Also tell them if you smoke, drink alcohol, or use illegal drugs. Some items may interact with your medicine. What should I watch for while using this medicine? Your condition will be monitored carefully while you are receiving this medicine. You will need important blood work done while you are taking this medicine. This drug may make you feel generally unwell. This is not uncommon, as chemotherapy can affect healthy cells as well as cancer cells. Report any side effects. Continue your course of treatment even  though you feel ill unless your doctor tells you to stop. In some cases, you may be given additional medicines to help with side effects. Follow all  directions for their use. Call your doctor or health care professional for advice if you get a fever, chills or sore throat, or other symptoms of a cold or flu. Do not treat yourself. This drug decreases your body's ability to fight infections. Try to avoid being around people who are sick. This medicine may increase your risk to bruise or bleed. Call your doctor or health care professional if you notice any unusual bleeding. This medicine may contain alcohol in the product. You may get drowsy or dizzy. Do not drive, use machinery, or do anything that needs mental alertness until you know how this medicine affects you. Do not stand or sit up quickly, especially if you are an older patient. This reduces the risk of dizzy or fainting spells. Avoid alcoholic drinks. Do not become pregnant while taking this medicine. Women should inform their doctor if they wish to become pregnant or think they might be pregnant. There is a potential for serious side effects to an unborn child. Talk to your health care professional or pharmacist for more information. Do not breast-feed an infant while taking this medicine. What side effects may I notice from receiving this medicine? Side effects that you should report to your doctor or health care professional as soon as possible: -allergic reactions like skin rash, itching or hives, swelling of the face, lips, or tongue -low blood counts - This drug may decrease the number of white blood cells, red blood cells and platelets. You may be at increased risk for infections and bleeding. -signs of infection - fever or chills, cough, sore throat, pain or difficulty passing urine -signs of decreased platelets or bleeding - bruising, pinpoint red spots on the skin, black, tarry stools, nosebleeds -signs of decreased red blood cells - unusually weak or tired, fainting spells, lightheadedness -breathing problems -fast or irregular heartbeat -low blood pressure -mouth sores -nausea  and vomiting -pain, swelling, redness or irritation at the injection site -pain, tingling, numbness in the hands or feet -swelling of the ankle, feet, hands -weight gain Side effects that usually do not require medical attention (report to your doctor or health care professional if they continue or are bothersome): -bone pain -complete hair loss including hair on your head, underarms, pubic hair, eyebrows, and eyelashes -diarrhea -excessive tearing -changes in the color of fingernails -loosening of the fingernails -nausea -muscle pain -red flush to skin -sweating -weak or tired This list may not describe all possible side effects. Call your doctor for medical advice about side effects. You may report side effects to FDA at 1-800-FDA-1088. Where should I keep my medicine? This drug is given in a hospital or clinic and will not be stored at home. NOTE: This sheet is a summary. It may not cover all possible information. If you have questions about this medicine, talk to your doctor, pharmacist, or health care provider.  2018 Elsevier/Gold Standard (2015-04-13 12:32:56)   Cyclophosphamide injection CYTOXAN What is this medicine? CYCLOPHOSPHAMIDE (sye kloe FOSS fa mide) is a chemotherapy drug. It slows the growth of cancer cells. This medicine is used to treat many types of cancer like lymphoma, myeloma, leukemia, breast cancer, and ovarian cancer, to name a few. This medicine may be used for other purposes; ask your health care provider or pharmacist if you have questions. COMMON BRAND NAME(S): Cytoxan, Neosar What should  I tell my health care provider before I take this medicine? They need to know if you have any of these conditions: -blood disorders -history of other chemotherapy -infection -kidney disease -liver disease -recent or ongoing radiation therapy -tumors in the bone marrow -an unusual or allergic reaction to cyclophosphamide, other chemotherapy, other medicines,  foods, dyes, or preservatives -pregnant or trying to get pregnant -breast-feeding How should I use this medicine? This drug is usually given as an injection into a vein or muscle or by infusion into a vein. It is administered in a hospital or clinic by a specially trained health care professional. Talk to your pediatrician regarding the use of this medicine in children. Special care may be needed. Overdosage: If you think you have taken too much of this medicine contact a poison control center or emergency room at once. NOTE: This medicine is only for you. Do not share this medicine with others. What if I miss a dose? It is important not to miss your dose. Call your doctor or health care professional if you are unable to keep an appointment. What may interact with this medicine? This medicine may interact with the following medications: -amiodarone -amphotericin B -azathioprine -certain antiviral medicines for HIV or AIDS such as protease inhibitors (e.g., indinavir, ritonavir) and zidovudine -certain blood pressure medications such as benazepril, captopril, enalapril, fosinopril, lisinopril, moexipril, monopril, perindopril, quinapril, ramipril, trandolapril -certain cancer medications such as anthracyclines (e.g., daunorubicin, doxorubicin), busulfan, cytarabine, paclitaxel, pentostatin, tamoxifen, trastuzumab -certain diuretics such as chlorothiazide, chlorthalidone, hydrochlorothiazide, indapamide, metolazone -certain medicines that treat or prevent blood clots like warfarin -certain muscle relaxants such as succinylcholine -cyclosporine -etanercept -indomethacin -medicines to increase blood counts like filgrastim, pegfilgrastim, sargramostim -medicines used as general anesthesia -metronidazole -natalizumab This list may not describe all possible interactions. Give your health care provider a list of all the medicines, herbs, non-prescription drugs, or dietary supplements you use.  Also tell them if you smoke, drink alcohol, or use illegal drugs. Some items may interact with your medicine. What should I watch for while using this medicine? Visit your doctor for checks on your progress. This drug may make you feel generally unwell. This is not uncommon, as chemotherapy can affect healthy cells as well as cancer cells. Report any side effects. Continue your course of treatment even though you feel ill unless your doctor tells you to stop. Drink water or other fluids as directed. Urinate often, even at night. In some cases, you may be given additional medicines to help with side effects. Follow all directions for their use. Call your doctor or health care professional for advice if you get a fever, chills or sore throat, or other symptoms of a cold or flu. Do not treat yourself. This drug decreases your body's ability to fight infections. Try to avoid being around people who are sick. This medicine may increase your risk to bruise or bleed. Call your doctor or health care professional if you notice any unusual bleeding. Be careful brushing and flossing your teeth or using a toothpick because you may get an infection or bleed more easily. If you have any dental work done, tell your dentist you are receiving this medicine. You may get drowsy or dizzy. Do not drive, use machinery, or do anything that needs mental alertness until you know how this medicine affects you. Do not become pregnant while taking this medicine or for 1 year after stopping it. Women should inform their doctor if they wish to become pregnant or think they  might be pregnant. Men should not father a child while taking this medicine and for 4 months after stopping it. There is a potential for serious side effects to an unborn child. Talk to your health care professional or pharmacist for more information. Do not breast-feed an infant while taking this medicine. This medicine may interfere with the ability to have a  child. This medicine has caused ovarian failure in some women. This medicine has caused reduced sperm counts in some men. You should talk with your doctor or health care professional if you are concerned about your fertility. If you are going to have surgery, tell your doctor or health care professional that you have taken this medicine. What side effects may I notice from receiving this medicine? Side effects that you should report to your doctor or health care professional as soon as possible: -allergic reactions like skin rash, itching or hives, swelling of the face, lips, or tongue -low blood counts - this medicine may decrease the number of white blood cells, red blood cells and platelets. You may be at increased risk for infections and bleeding. -signs of infection - fever or chills, cough, sore throat, pain or difficulty passing urine -signs of decreased platelets or bleeding - bruising, pinpoint red spots on the skin, black, tarry stools, blood in the urine -signs of decreased red blood cells - unusually weak or tired, fainting spells, lightheadedness -breathing problems -dark urine -dizziness -palpitations -swelling of the ankles, feet, hands -trouble passing urine or change in the amount of urine -weight gain -yellowing of the eyes or skin Side effects that usually do not require medical attention (report to your doctor or health care professional if they continue or are bothersome): -changes in nail or skin color -hair loss -missed menstrual periods -mouth sores -nausea, vomiting This list may not describe all possible side effects. Call your doctor for medical advice about side effects. You may report side effects to FDA at 1-800-FDA-1088. Where should I keep my medicine? This drug is given in a hospital or clinic and will not be stored at home. NOTE: This sheet is a summary. It may not cover all possible information. If you have questions about this medicine, talk to your  doctor, pharmacist, or health care provider.  2018 Elsevier/Gold Standard (2012-01-24 16:22:58)

## 2016-08-09 NOTE — Progress Notes (Signed)
1300- Pt tolerated chemo well without any complications. Reviewed possible side effects, pt may expect to experience at home post chemo. Reinforcement of oral hydration, rest, and hand hygiene to prevent infection. Placed follow up chemo call for Monday next week. Told pt to go to ED if she develops sudden cp/sob/diziness or uncontrolled n/v. Reviewed home medications for nausea. Advised pt to take claritin 10mg  po 1/day for 3-4 days with neulasta onpro. Pt advised to call with any questions or concerns. Pt verbalized understanding.

## 2016-08-09 NOTE — Progress Notes (Signed)
Patient Care Team: Javier Docker, MD as PCP - General (Internal Medicine) Erroll Luna, MD as Consulting Physician (General Surgery) Nicholas Lose, MD as Consulting Physician (Hematology and Oncology) Kyung Rudd, MD as Consulting Physician (Radiation Oncology)  DIAGNOSIS:  Encounter Diagnosis  Name Primary?  . Malignant neoplasm of overlapping sites of right breast in female, estrogen receptor positive ([place])     SUMMARY OF ONCOLOGIC HISTORY:   Malignant neoplasm of overlapping sites of right breast in female, estrogen receptor positive ([place])   07/18/2016 Initial Diagnosis    Palpable right breast masses for 1 year; 2 adjacent spiculated masses by ultrasound at 1:00 subareolar 4.2 cm mass; 3 satellite nodules at 9:00; 2 abnormal lymph nodes; biopsy of the 2 masses and the lymph node showed similar-appearing grade 3 IDC ER 100% PR 95% Ki-67 20-30%, HER-2 negative ratio 1.57; T2 N1 stage IIB (AJCC 8)      08/09/2016 -  Neo-Adjuvant Chemotherapy    Taxotere and Cytoxan every 3 weeks 4 cycles       CHIEF COMPLIANT: Cycle 1 Taxotere and Cytoxan  INTERVAL HISTORY: [name] is a 46 year old with above-mentioned history of right breast cancer with abnormal lymph nodes that was ER/PR positive and HER-2 negative and is here today to start neoadjuvant chemotherapy to shrink the cancer. Today is cycle 1 day 1.  REVIEW OF SYSTEMS:   Constitutional: Denies fevers, chills or abnormal weight loss Eyes: Denies blurriness of vision Ears, nose, mouth, throat, and face: Denies mucositis or sore throat Respiratory: Denies cough, dyspnea or wheezes Cardiovascular: Denies palpitation, chest discomfort Gastrointestinal:  Denies nausea, heartburn or change in bowel habits Skin: Denies abnormal skin rashes Lymphatics: Denies new lymphadenopathy or easy bruising Neurological:Denies numbness, tingling or new weaknesses Behavioral/Psych: Mood is stable, no new changes  Extremities: No  lower extremity edema All other systems were reviewed with the patient and are negative.  I have reviewed the past medical history, past surgical history, social history and family history with the patient and they are unchanged from previous note.  ALLERGIES:  has No Known Allergies.  MEDICATIONS:  Current Outpatient Prescriptions  Medication Sig Dispense Refill  . albuterol (PROVENTIL) (2.5 MG/3ML) 0.083% nebulizer solution Take 3 mLs (2.5 mg total) by nebulization every 6 (six) hours as needed for wheezing or shortness of breath. 75 mL 12  . amLODipine (NORVASC) 10 MG tablet Take 10 mg by mouth daily.    Marland Kitchen aspirin 81 MG chewable tablet Chew 81 mg by mouth every morning.     Marland Kitchen atorvastatin (LIPITOR) 80 MG tablet Take 1 tablet (80 mg total) by mouth daily at 6 PM. 30 tablet 0  . clopidogrel (PLAVIX) 75 MG tablet Take 1 tablet (75 mg total) by mouth daily with breakfast. 30 tablet 1  . dexamethasone (DECADRON) 4 MG tablet Take 1 tablet (4 mg total) by mouth daily. Take 1 tablet the day before Taxotere. Then 1 tablet again the day after chemo. 8 tablet 0  . isosorbide mononitrate (IMDUR) 30 MG 24 hr tablet Take 1 tablet (30 mg total) by mouth daily. 30 tablet 0  . lidocaine-prilocaine (EMLA) cream Apply to affected area once 30 g 3  . LORazepam (ATIVAN) 0.5 MG tablet Take 1 tablet (0.5 mg total) by mouth at bedtime. 30 tablet 0  . losartan (COZAAR) 50 MG tablet Take 50 mg by mouth daily.  3  . metoprolol succinate (TOPROL XL) 100 MG 24 hr tablet Take 1 tablet (100 mg total) by mouth daily.  Take with or immediately following a meal. 30 tablet 0  . mometasone-formoterol (DULERA) 200-5 MCG/ACT AERO Inhale 2 puffs into the lungs 2 (two) times daily. 1 Inhaler 1  . montelukast (SINGULAIR) 10 MG tablet Take 1 tablet (10 mg total) by mouth at bedtime. 30 tablet 0  . nitroGLYCERIN (NITROSTAT) 0.4 MG SL tablet Place 0.4 mg under the tongue every 5 (five) minutes as needed for chest pain.    Marland Kitchen  ondansetron (ZOFRAN) 8 MG tablet Take 1 tablet (8 mg total) by mouth 2 (two) times daily as needed for refractory nausea / vomiting. Start on day 3 after chemo. 30 tablet 1  . prochlorperazine (COMPAZINE) 10 MG tablet Take 1 tablet (10 mg total) by mouth every 6 (six) hours as needed (Nausea or vomiting). 30 tablet 1   No current facility-administered medications for this visit.     PHYSICAL EXAMINATION: ECOG PERFORMANCE STATUS: 1 - Symptomatic but completely ambulatory  Vitals:   08/09/16 0939  BP: 139/77  Pulse: 66  Resp: 19  Temp: 97.7 F (36.5 C)   Filed Weights   08/09/16 0939  Weight: 164 lb 3.2 oz (74.5 kg)    GENERAL:alert, no distress and comfortable SKIN: skin color, texture, turgor are normal, no rashes or significant lesions EYES: normal, Conjunctiva are pink and non-injected, sclera clear OROPHARYNX:no exudate, no erythema and lips, buccal mucosa, and tongue normal  NECK: supple, thyroid normal size, non-tender, without nodularity LYMPH:  no palpable lymphadenopathy in the cervical, axillary or inguinal LUNGS: clear to auscultation and percussion with normal breathing effort HEART: regular rate & rhythm and no murmurs and no lower extremity edema ABDOMEN:abdomen soft, non-tender and normal bowel sounds MUSCULOSKELETAL:no cyanosis of digits and no clubbing  NEURO: alert & oriented x 3 with fluent speech, no focal motor/sensory deficits EXTREMITIES: No lower extremity edema  LABORATORY DATA:  I have reviewed the data as listed   Chemistry      Component Value Date/Time   NA 145 07/24/2016 1318   K 4.1 07/24/2016 1318   CL 111 04/12/2016 0948   CO2 24 07/24/2016 1318   BUN 6.3 (L) 07/24/2016 1318   CREATININE 0.8 07/24/2016 1318      Component Value Date/Time   CALCIUM 10.1 07/24/2016 1318   ALKPHOS 133 07/24/2016 1318   AST 12 07/24/2016 1318   ALT 10 07/24/2016 1318   BILITOT 0.36 07/24/2016 1318       Lab Results  Component Value Date   WBC 15.3  (H) 08/09/2016   HGB 14.7 08/09/2016   HCT 43.3 08/09/2016   MCV 91.0 08/09/2016   PLT 246 08/09/2016   NEUTROABS 8.6 (H) 08/09/2016    ASSESSMENT & PLAN:  Malignant neoplasm of overlapping sites of right breast in female, estrogen receptor positive ([place]) 07/18/2016: Palpable right breast masses for 1 year; 2 adjacent spiculated masses by ultrasound at 1:00 subareolar 4.2 cm mass; 3 satellite nodules at 9:00; 2 abnormal lymph nodes; biopsy of the 2 masses and the lymph node showed similar-appearing 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  Recommendation based on multidisciplinary tumor board: 1. Neoadjuvant chemotherapy with Taxotere and Cytoxan every 3 weeks 4 2. Followed by breast conserving surgery with sentinel lymph node study vs targeted axillary dissection 3. Followed by adjuvant radiation therapy -------------------------------------------------------------------------------- Current treatment: Cycle 1 Taxotere Cytoxan Chemotherapy consent  obtained Anti emetics were discussed Labs reviewed RTC in 1 week for tox check  I spent 25 minutes talking to the patient of which more than half was spent in counseling and coordination of care.  No orders of the defined types were placed in this encounter.  The patient has a good understanding of the overall plan. she agrees with it. she will call with any problems that may develop before the next visit here.   Rulon Eisenmenger, MD 08/09/16

## 2016-08-13 ENCOUNTER — Encounter: Payer: Self-pay | Admitting: Genetic Counselor

## 2016-08-13 ENCOUNTER — Other Ambulatory Visit: Payer: Medicaid Other

## 2016-08-13 ENCOUNTER — Ambulatory Visit (HOSPITAL_BASED_OUTPATIENT_CLINIC_OR_DEPARTMENT_OTHER): Payer: Medicaid Other | Admitting: Genetic Counselor

## 2016-08-13 DIAGNOSIS — Z7183 Encounter for nonprocreative genetic counseling: Secondary | ICD-10-CM

## 2016-08-13 DIAGNOSIS — Z8042 Family history of malignant neoplasm of prostate: Secondary | ICD-10-CM

## 2016-08-13 DIAGNOSIS — C50811 Malignant neoplasm of overlapping sites of right female breast: Secondary | ICD-10-CM

## 2016-08-13 DIAGNOSIS — Z8572 Personal history of non-Hodgkin lymphomas: Secondary | ICD-10-CM

## 2016-08-13 DIAGNOSIS — Z803 Family history of malignant neoplasm of breast: Secondary | ICD-10-CM

## 2016-08-13 DIAGNOSIS — Z8579 Personal history of other malignant neoplasms of lymphoid, hematopoietic and related tissues: Secondary | ICD-10-CM

## 2016-08-13 DIAGNOSIS — Z170 Estrogen receptor positive status [ER+]: Secondary | ICD-10-CM

## 2016-08-13 NOTE — Progress Notes (Signed)
REFERRING PROVIDER: Nicholas Lose, MD [place], [place] [address]  PRIMARY PROVIDER:  Javier Docker, MD  PRIMARY REASON FOR VISIT:  1. Malignant neoplasm of overlapping sites of right breast in female, estrogen receptor positive ([name])   2. Family history of breast cancer   3. Family history of prostate cancer   4. History of lymphoma      HISTORY OF PRESENT ILLNESS:   [name], a 46 y.o. female, was seen for a [facility] cancer genetics consultation at the request of Dr. Lindi Bates due to a personal and family history of cancer.  [name] presents to clinic today, with her daughter, to discuss the possibility of a hereditary predisposition to cancer, genetic testing, and to further clarify her future cancer risks, as well as potential cancer risks for family members.   In 2018, at the age of 13, [name] was diagnosed with invasive ductal carcinoma of the right breast. This was treated with chemotherapy, and she will have lumpectomy and radiation.  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  This was treated with chemotherapy only. She reports that there was no radiation given.     CANCER HISTORY:    Malignant neoplasm of overlapping sites of right breast in female, estrogen receptor positive ([place])   07/18/2016 Initial Diagnosis    Palpable right breast masses for 1 year; 2 adjacent spiculated masses by ultrasound at 1:00 subareolar 4.2 cm mass; 3 satellite nodules at 9:00; 2 abnormal lymph nodes; biopsy of the 2 masses and the lymph node showed similar-appearing grade 3 IDC ER 100% PR 95% Ki-67 20-30%, HER-2 negative ratio 1.57; T2 N1 stage IIB (AJCC 8)      08/09/2016 -  Neo-Adjuvant Chemotherapy    Taxotere and Cytoxan every 3 weeks 4 cycles        HORMONAL RISK FACTORS:  Menarche was at age 6.  First live birth at age 68.  OCP use for approximately 0 years.  Ovaries intact: yes.  Hysterectomy: no.   Menopausal status: postmenopausal.  HRT use: 0 years. Colonoscopy: no; not examined. Mammogram within the last year: yes. Number of breast biopsies: 1. Up to date with pelvic exams:  no. Any excessive radiation exposure in the past:  no  Past Medical History:  Diagnosis Date  . Asthma   . Bell's palsy   . Cardiomegaly   . Family history of breast cancer   . Family history of prostate cancer   . GERD (gastroesophageal reflux disease)   . Hypertension   . Lymphoma of lymph nodes of neck ([place])   . OSA (obstructive sleep apnea)   . Renal artery stenosis ([facility])    s/p stent placment to left     Past Surgical History:  Procedure Laterality Date  . CARDIAC CATHETERIZATION N/A 09/18/2015   Procedure: Left Heart Cath and Coronary Angiography;  Surgeon: Leonie Man, MD;  Location: [facility];  Service: Cardiovascular;  Laterality: N/A;  . [place]   x2  . IR CV LINE INJECTION  07/24/2016  . PERIPHERAL VASCULAR CATHETERIZATION N/A 08/23/2014   Procedure: Renal Angiography;  Surgeon: Adrian Prows, MD;  Location: [facility];  Service: Cardiovascular;  Laterality: N/A;  . RENAL ARTERY STENT Left 08/23/2014  . SKIN BIOPSY  2000    Social History   Social History  . Marital status: Single    Spouse name: N/A  .  Number of children: 4  . Years of education: 9   Occupational History  . Unemployed    Social History Main Topics  . Smoking status: Current Every Day Smoker    Packs/day: 0.50    Years: 15.00    Types: Cigarettes  . Smokeless tobacco: Never Used     Comment: 6 cig per day  . Alcohol use No  . Drug use: No  . Sexual activity: Not on file   Other Topics Concern  . Not on file   Social History Narrative   Lives at home with fiance and kids   Caffeine use: 2 cups coffee per day   4 Dr. Malachi Bonds (12oz) per day      FAMILY HISTORY:  We obtained a detailed, 4-generation family history.  Significant diagnoses are listed  below: Family History  Problem Relation Age of Onset  . Hypertension Mother   . Cervical cancer Mother        hysterectomy with ?metastatic disease  . Coronary artery disease Father 46       CABG  . Asthma Father   . Breast cancer Maternal Aunt        age at diagnosis unknown  . Prostate cancer Cousin        paternal first cousin  . Migraines Neg Hx     The patient has four children, two girls and two boys, all who are cancer free.  She has two full sisters and a full brother who are cancer free.  She has a maternal half brother and a paternal half brother who are cancer free.  The patient's parents are both deceased. Her father died of heart failure at 39, and her mother died of possible cervical vs endometrial cancer at 17.  Her mother had 53 siblings, one sister had breast cancer in her 46's.  There is no additional cancer history on the maternal side.  The patient's father had many siblings, none reportedly had cancer.  One paternal cousin has prostate cancer.  [name] is unaware of previous family history of genetic testing for hereditary cancer risks. Patient's maternal ancestors are of African American descent, and paternal ancestors are of African American descent. There is no reported Ashkenazi Jewish ancestry. There is no known consanguinity.  GENETIC COUNSELING ASSESSMENT: [name] is a 46 y.o. female with a personal history of lymphoma and breast cancer and family history of prostate and breast cancer which is somewhat suggestive of a hereditary cancer syndrome and predisposition to cancer. We, therefore, discussed and recommended the following at today's visit.   DISCUSSION: We discussed that about 5-10% of breast cancer is thought to be hereditary, with most cases due to BRCA mutations.  Other genes can be associated with breast cancer, including ATM, CHEK2 and PALB2.  Lymphoma is not typically associated with hereditary cancer syndromes, so her previous history of  lymphoma does not make [place] lean toward a particular condition.  We reviewed the characteristics, features and inheritance patterns of hereditary cancer syndromes. We also discussed genetic testing, including the appropriate family members to test, the process of testing, insurance coverage and turn-around-time for results. We discussed the implications of a negative, positive and/or variant of uncertain significant result. We recommended [name] pursue genetic testing for the Common Hereditary Cancer gene panel. The Hereditary Gene Panel offered by [facility] includes sequencing and/or deletion duplication testing of the following 46 genes: APC, ATM, AXIN2, BARD1, BMPR1A, BRCA1, BRCA2, BRIP1, CDH1, CDKN2A (p14ARF), CDKN2A (p16INK4a), CHEK2, CTNNA1,  DICER1, EPCAM (Deletion/duplication testing only), GREM1 (promoter region deletion/duplication testing only), KIT, MEN1, MLH1, MSH2, MSH3, MSH6, MUTYH, NBN, NF1, NHTL1, PALB2, PDGFRA, PMS2, POLD1, POLE, PTEN, RAD50, RAD51C, RAD51D, SDHB, SDHC, SDHD, SMAD4, SMARCA4. STK11, TP53, TSC1, TSC2, and VHL.  The following genes were evaluated for sequence changes only: SDHA and HOXB13 c.251G>A variant only.  Based on [name]'s personal and family history of cancer, she meets medical criteria for genetic testing. Despite that she meets criteria, she may still have an out of pocket cost. We discussed that if her out of pocket cost for testing is over $100, the laboratory will call and confirm whether she wants to proceed with testing.  If the out of pocket cost of testing is less than $100 she will be billed by the genetic testing laboratory.   PLAN: After considering the risks, benefits, and limitations, [name]  provided informed consent to pursue genetic testing and the blood sample was sent to [facility] for analysis of the Common Hereditary cancer panel. Results should be available within approximately 2-3 weeks' time, at which point they will be disclosed by  telephone to [name], as will any additional recommendations warranted by these results. [name] will receive a summary of her genetic counseling visit and a copy of her results once available. This information will also be available in Epic. We encouraged [name] to remain in contact with cancer genetics annually so that we can continuously update the family history and inform her of any changes in cancer genetics and testing that may be of benefit for her family. [name] questions were answered to her satisfaction today. Our contact information was provided should additional questions or concerns arise.  Lastly, we encouraged [name] to remain in contact with cancer genetics annually so that we can continuously update the family history and inform her of any changes in cancer genetics and testing that may be of benefit for this family.   Ms.  Bates questions were answered to her satisfaction today. Our contact information was provided should additional questions or concerns arise. Thank you for the referral and allowing [place] to share in the care of your patient.   Daylah Sayavong P. Florene Glen, [place], [facility] Certified Genetic Counselor [email or web address] phone: [phone]  The patient was seen for a total of 35 minutes in face-to-face genetic counseling.  This patient was discussed with Drs. Magrinat, [name] and/or Burr Medico who agrees with the above.    _______________________________________________________________________ For Office Staff:  Number of people involved in session: 2 Was an Intern/ student involved with case: no

## 2016-08-14 ENCOUNTER — Other Ambulatory Visit: Payer: Self-pay

## 2016-08-14 ENCOUNTER — Telehealth: Payer: Self-pay

## 2016-08-14 NOTE — Telephone Encounter (Signed)
Called pt to do follow up chemo call. Pt stated that she had taken compazine one time a few days ago and her throat started swelling. Pt took benadryl and pt experienced relief after a few hours. Pt states that she had some mild nausea but it has since gone away. Pt also states that she is eating and drinking plenty of fluids. She states that she is experiencing some diarrhea as well. Pt reports diarrhea episode a few minutes after eating. Denies any fever, weakness, or dizziness at this time. Pt to come back for labs/md on Friday. Pt instructed to take zofran prn for nausea. Pt denies any major fatigue symptoms at this time. Encouraged pt to keep hydration and to call if pt symptoms gets worse or go to the closest e.d. Pt verbalized understanding.

## 2016-08-16 ENCOUNTER — Encounter: Payer: Self-pay | Admitting: Adult Health

## 2016-08-16 ENCOUNTER — Other Ambulatory Visit (HOSPITAL_BASED_OUTPATIENT_CLINIC_OR_DEPARTMENT_OTHER): Payer: Medicaid Other

## 2016-08-16 ENCOUNTER — Encounter: Payer: Self-pay | Admitting: *Deleted

## 2016-08-16 ENCOUNTER — Ambulatory Visit (HOSPITAL_BASED_OUTPATIENT_CLINIC_OR_DEPARTMENT_OTHER): Payer: Medicaid Other | Admitting: Adult Health

## 2016-08-16 VITALS — BP 126/93 | HR 103 | Temp 98.6°F | Resp 18 | Ht 63.0 in | Wt 163.9 lb

## 2016-08-16 DIAGNOSIS — C50811 Malignant neoplasm of overlapping sites of right female breast: Secondary | ICD-10-CM

## 2016-08-16 DIAGNOSIS — Z170 Estrogen receptor positive status [ER+]: Principal | ICD-10-CM

## 2016-08-16 LAB — CBC WITH DIFFERENTIAL/PLATELET
BASO%: 0.2 % (ref 0.0–2.0)
Basophils Absolute: 0.1 10*3/uL (ref 0.0–0.1)
EOS%: 0.8 % (ref 0.0–7.0)
Eosinophils Absolute: 0.2 10*3/uL (ref 0.0–0.5)
HCT: 43.2 % (ref 34.8–46.6)
HGB: 14.5 g/dL (ref 11.6–15.9)
LYMPH%: 18.9 % (ref 14.0–49.7)
MCH: 30.6 pg (ref 25.1–34.0)
MCHC: 33.6 g/dL (ref 31.5–36.0)
MCV: 91.1 fL (ref 79.5–101.0)
MONO#: 0.1 10*3/uL (ref 0.1–0.9)
MONO%: 0.7 % (ref 0.0–14.0)
NEUT%: 79.4 % — AB (ref 38.4–76.8)
NEUTROS ABS: 17 10*3/uL — AB (ref 1.5–6.5)
Platelets: 235 10*3/uL (ref 145–400)
RBC: 4.74 10*6/uL (ref 3.70–5.45)
RDW: 13.9 % (ref 11.2–14.5)
WBC: 21.4 10*3/uL — AB (ref 3.9–10.3)
lymph#: 4 10*3/uL — ABNORMAL HIGH (ref 0.9–3.3)

## 2016-08-16 LAB — COMPREHENSIVE METABOLIC PANEL
ALT: 20 U/L (ref 0–55)
ANION GAP: 10 meq/L (ref 3–11)
AST: 16 U/L (ref 5–34)
Albumin: 4 g/dL (ref 3.5–5.0)
Alkaline Phosphatase: 114 U/L (ref 40–150)
BUN: 2.7 mg/dL — ABNORMAL LOW (ref 7.0–26.0)
CHLORIDE: 104 meq/L (ref 98–109)
CO2: 26 meq/L (ref 22–29)
CREATININE: 0.8 mg/dL (ref 0.6–1.1)
Calcium: 9.7 mg/dL (ref 8.4–10.4)
EGFR: >90 mL/min/{1.73_m2} (ref >90)
GLUCOSE: 95 mg/dL (ref 70–140)
Potassium: 3.5 mEq/L (ref 3.5–5.1)
SODIUM: 140 meq/L (ref 136–145)
TOTAL PROTEIN: 7.1 g/dL (ref 6.4–8.3)

## 2016-08-16 MED ORDER — MAGIC MOUTHWASH: 5.0000 mL | Freq: Four times a day (QID) | ORAL | 0 refills | Status: DC | PRN

## 2016-08-16 MED ORDER — FLUCONAZOLE 200 MG PO TABS: 200.0000 mg | ORAL_TABLET | Freq: Every day | ORAL | 2 refills | Status: DC

## 2016-08-16 MED ORDER — BETAMETHASONE VALERATE 0.1 % EX OINT: 1.0000 "application " | TOPICAL_OINTMENT | Freq: Two times a day (BID) | CUTANEOUS | 0 refills | Status: DC

## 2016-08-16 NOTE — Patient Instructions (Signed)
Oral Thrush, Adult Oral thrush, also called oral candidiasis, is a fungal infection that develops in the mouth and throat and on the tongue. It causes white patches to form on the mouth and tongue. [name] is most common in older adults, but it can occur at any age. Many cases of thrush are mild, but this infection can also be serious. [name] can be a repeated (recurrent) problem for certain people who have a weak body defense system (immune system). The weakness can be caused by chronic illnesses, or by taking medicines that limit the body's ability to fight infection. If a person has difficulty fighting infection, the fungus that causes thrush can spread through the body. This can cause life-threatening blood or organ infections. What are the causes? This condition is caused by a fungus (yeast) called Candida albicans.  This fungus is normally present in small amounts in the mouth and on other mucous membranes. It usually causes no harm.  If conditions are present that allow the fungus to grow without control, it invades surrounding tissues and becomes an infection.  Other Candida species can also lead to thrush (rare). What increases the risk? This condition is more likely to develop in:  People with a weakened immune system.  Older adults.  People with HIV (human immunodeficiency virus).  People with diabetes.  People with dry mouth (xerostomia).  Pregnant women.  People with poor dental care, especially people who have false teeth.  People who use antibiotic medicines. What are the signs or symptoms? Symptoms of this condition can vary from mild and moderate to severe and persistent. Symptoms may include:  A burning feeling in the mouth and throat. This can occur at the start of a thrush infection.  White patches that stick to the mouth and tongue. The tissue around the patches may be red, raw, and painful. If rubbed (during tooth brushing, for example), the patches and the  tissue of the mouth may bleed easily.  A bad taste in the mouth or difficulty tasting foods.  A cottony feeling in the mouth.  Pain during eating and swallowing.  Poor appetite.  Cracking at the corners of the mouth. How is this diagnosed? This condition is diagnosed based on:  Physical exam. Your health care provider will look in your mouth.  Health history. Your health care provider will ask you questions about your health. How is this treated? This condition is treated with medicines called antifungals, which prevent the growth of fungi. These medicines are either applied directly to the affected area (topical) or swallowed (oral). The treatment will depend on the severity of the condition. Mild thrush  Mild cases of thrush may clear up with the use of an antifungal mouth rinse or lozenges. Treatment usually lasts about 14 days. Moderate to severe thrush   More severe thrush infections that have spread to the esophagus are treated with an oral antifungal medicine. A topical antifungal medicine may also be used.  For some severe infections, treatment may need to continue for more than 14 days.  Oral antifungal medicines are rarely used during pregnancy because they may be harmful to the unborn child. If you are pregnant, talk with your health care provider about options for treatment. Persistent or recurrent thrush  For cases of thrush that do not go away or keep coming back:  Treatment may be needed twice as long as the symptoms last.  Treatment will include both oral and topical antifungal medicines.  People with a weakened immune system  can take an antifungal medicine on a continuous basis to prevent thrush infections. It is important to treat conditions that make a person more likely to get thrush, such as diabetes or HIV. Follow these instructions at home: Medicines   Take over-the-counter and prescription medicines only as told by your health care provider.  Talk  with your health care provider about an over-the-counter medicine called gentian violet, which kills bacteria and fungi. Relieving soreness and discomfort  To help reduce the discomfort of thrush:  Drink cold liquids such as water or iced tea.  Try flavored ice treats or frozen juices.  Eat foods that are easy to swallow, such as gelatin, ice cream, or custard.  Try drinking from a straw if the patches in your mouth are painful. General instructions   Eat plain, unflavored yogurt as directed by your health care provider. Check the label to make sure the yogurt contains live cultures. This yogurt can help healthy bacteria to grow in the mouth and can stop the growth of the fungus that causes thrush.  If you wear dentures, remove the dentures before going to bed, brush them vigorously, and soak them in a cleaning solution as directed by your health care provider.  Rinse your mouth with a warm salt-water mixture several times a day. To make a salt-water mixture, completely dissolve 1/2-1 tsp of salt in 1 cup of warm water. Contact a health care provider if:  Your symptoms are getting worse or are not improving within 7 days of starting treatment.  You have symptoms of a spreading infection, such as white patches on the skin outside of the mouth. This information is not intended to replace advice given to you by your health care provider. Make sure you discuss any questions you have with your health care provider. Document Released: 12/05/2003 Document Revised: 12/04/2015 Document Reviewed: 12/04/2015 Elsevier Interactive Patient Education  2017 Elsevier Inc. Atropine; Diphenoxylate oral liquid What is this medicine? ATROPINE; DIPHENOXYLATE (A troe peen dye fen OX i late) is used to treat diarrhea. This medicine may be used for other purposes; ask your health care provider or pharmacist if you have questions. COMMON BRAND NAME(S): Lomotil What should I tell my health care provider before  I take this medicine? They need to know if you have any of these conditions: -bacterial food poisoning -colitis -dehydration -Down's syndrome -jaundice or liver disease -an unusual or allergic reaction to atropine, diphenoxylate, other medicines, foods, dyes, or preservatives -pregnant or trying to get pregnant -breast-feeding How should I use this medicine? Take this medicine by mouth. Follow the directions on the prescription label. Use a specially marked spoon or dropper to measure your medicine. Ask your pharmacist if you do not have one. Household spoons are not accurate. Take your medicine at regular intervals. Do not take it more often than directed. Once your diarrhea has been brought under control your doctor or health care professional may reduce your doses. Talk to your pediatrician regarding the use of this medicine in children. Special care may be needed. Overdosage: If you think you have taken too much of this medicine contact a poison control center or emergency room at once. NOTE: This medicine is only for you. Do not share this medicine with others. What if I miss a dose? If you miss a dose, take it as soon as you can. If it is almost time for your next dose, take only that dose. Do not take double or extra doses. What may interact with  this medicine? -alcohol -antihistamines for allergy, cough and cold -barbiturate medicines for inducing sleep or treating seizures -certain medicines for depression, anxiety, or psychotic disturbances -certain medicines for sleep -medicines for movement abnormalities as in Parkinson's disease, or for gastrointestinal problems -muscle relaxants -narcotic medicines (opiates) for pain This list may not describe all possible interactions. Give your health care provider a list of all the medicines, herbs, non-prescription drugs, or dietary supplements you use. Also tell them if you smoke, drink alcohol, or use illegal drugs. Some items may  interact with your medicine. What should I watch for while using this medicine? If your symptoms do not start to get better after taking this medicine for two days, check with your doctor or health care professional, you may have a problem that needs further evaluation. Check with your doctor or health care professional right away if you develop a fever or bloody diarrhea. You may get drowsy or dizzy. Do not drive, use machinery, or do anything that needs mental alertness until you know how this medicine affects you. Do not stand or sit up quickly, especially if you are an older patient. This reduces the risk of dizzy or fainting spells. Alcohol may interfere with the effect of this medicine. Avoid alcoholic drinks. Your mouth may get dry. Chewing sugarless gum or sucking hard candy, and drinking plenty of water may help. Contact your doctor if the problem does not go away or is severe. Drinking plenty of water can also help prevent dehydration that can occur with diarrhea. What side effects may I notice from receiving this medicine? Side effects that you should report to your doctor or health care professional as soon as possible: -allergic reactions like skin rash, itching or hives, swelling of the face, lips, or tongue -bloated, swollen feeling -breathing problems -changes in vision -fast, irregular heartbeat -stomach pain Side effects that usually do not require medical attention (report to your doctor or health care professional if they continue or are bothersome): -headache -loss of appetite -mood changes -nausea, vomiting -pain, tingling, numbness in the hands or feet This list may not describe all possible side effects. Call your doctor for medical advice about side effects. You may report side effects to FDA at 1-800-FDA-1088. Where should I keep my medicine? Keep out of the reach of children. This medicine can be abused. Keep your medicine in a safe place to protect it from theft. Do  not share this medicine with anyone. Selling or giving away this medicine is dangerous and against the law. This medicine may cause accidental overdose and death if taken by other adults, children, or pets. Mix any unused medicine with a substance like cat litter or coffee grounds. Then throw the medicine away in a sealed container like a sealed bag or a coffee can with a lid. Do not use the medicine after the expiration date. Store at room temperature between 15 and 30 degrees C (59 and 86 degrees F). Do not freeze. Protect from light. Keep container tightly closed. NOTE: This sheet is a summary. It may not cover all possible information. If you have questions about this medicine, talk to your doctor, pharmacist, or health care provider.  2018 Elsevier/Gold Standard (2016-01-16 13:26:05) [name] Diet A bland diet consists of foods that do not have a lot of fat or fiber. Foods without fat or fiber are easier for the body to digest. They are also less likely to irritate your mouth, throat, stomach, and other parts of your gastrointestinal tract. A  bland diet is sometimes called a BRAT diet. What is my plan? Your health care provider or dietitian may recommend specific changes to your diet to prevent and treat your symptoms, such as:  Eating small meals often.  Cooking food until it is soft enough to chew easily.  Chewing your food well.  Drinking fluids slowly.  Not eating foods that are very spicy, sour, or fatty.  Not eating citrus fruits, such as oranges and grapefruit. What do I need to know about this diet?  Eat a variety of foods from the bland diet food list.  Do not follow a bland diet longer than you have to.  Ask your health care provider whether you should take vitamins. What foods can I eat? Grains   Hot cereals, such as cream of wheat. Bread, crackers, or tortillas made from refined white flour. Rice. Vegetables  Canned or cooked vegetables. Mashed or boiled  potatoes. Fruits  Bananas. Applesauce. Other types of cooked or canned fruit with the skin and seeds removed, such as canned peaches or pears. Meats and Other Protein Sources  Scrambled eggs. Creamy peanut butter or other nut butters. Lean, well-cooked meats, such as chicken or fish. Tofu. Soups or broths. Dairy  Low-fat dairy products, such as milk, cottage cheese, or yogurt. Beverages  Water. Herbal tea. Apple juice. Sweets and Desserts  Pudding. Custard. Fruit gelatin. Ice cream. Fats and Oils  Mild salad dressings. Canola or olive oil. The items listed above may not be a complete list of allowed foods or beverages. Contact your dietitian for more options.  What foods are not recommended? Foods and ingredients that are often not recommended include:  Spicy foods, such as hot sauce or salsa.  Fried foods.  Sour foods, such as pickled or fermented foods.  Raw vegetables or fruits, especially citrus or berries.  Caffeinated drinks.  Alcohol.  Strongly flavored seasonings or condiments. The items listed above may not be a complete list of foods and beverages that are not allowed. Contact your dietitian for more information.  This information is not intended to replace advice given to you by your health care provider. Make sure you discuss any questions you have with your health care provider. Document Released: 07/03/2015 Document Revised: 08/17/2015 Document Reviewed: 03/23/2014 Elsevier Interactive Patient Education  2017 [employer].

## 2016-08-16 NOTE — Progress Notes (Signed)
[facility] Follow up:    Kimberly Docker, MD [address] [place] [address]   DIAGNOSIS: Cancer Staging Malignant neoplasm of overlapping sites of right breast in female, estrogen receptor positive ([place]) Staging form: Breast, AJCC 8th Edition - Clinical: Stage IIB (cT2, cN1(f), cM0, G3, ER: Positive, PR: Positive, HER2: Negative) - Unsigned   SUMMARY OF ONCOLOGIC HISTORY:   Malignant neoplasm of overlapping sites of right breast in female, estrogen receptor positive ([place])   07/18/2016 Initial Diagnosis    Palpable right breast masses for 1 year; 2 adjacent spiculated masses by ultrasound at 1:00 subareolar 4.2 cm mass; 3 satellite nodules at 9:00; 2 abnormal lymph nodes; biopsy of the 2 masses and the lymph node showed similar-appearing grade 3 IDC ER 100% PR 95% Ki-67 20-30%, HER-2 negative ratio 1.57; T2 N1 stage IIB (AJCC 8)      08/09/2016 -  Neo-Adjuvant Chemotherapy    Taxotere and Cytoxan every 3 weeks 4 cycles      08/13/2016 Genetic Testing    Genetic testing is pending       CURRENT THERAPY: Neoajduvant chemotherapy  INTERVAL HISTORY: [name] 46 y.o. female returns for evalaution following her first cycle of Taxotere and Cytoxan.  She called because she was having nausea and diarrhea.  She didn't take anything for nausea because she had an allergic reaction to Compazine, this happened about 2 days ago.  She took benadryl around the clock and it resolved.  She says the nausea is better.  The diarrhea started the day following chemotherapy, it was initially 4-5 times per day, watery, there was no blood, mucous or pus present.  The diarrhea is still occurring 4-5 times per day.  Her appetite and fluid intake is good.  She is drinking 3 16 ounce bottles of water per day and 4 cans of ginger ale per day.  She denies any fevers or chills.  She is also suffering from mucositis.     Patient Active Problem List   Diagnosis Date  Noted  . Family history of breast cancer   . Family history of prostate cancer   . Malignant neoplasm of overlapping sites of right breast in female, estrogen receptor positive ([place]) 07/23/2016  . CAD at cath 09/18/15 09/28/2015  . Chest pain at rest 09/18/2015  . Hypertensive urgency 09/18/2015  . NSTEMI (non-ST elevated myocardial infarction) ([place]) 09/18/2015  . Hypokalemia 09/18/2015  . Asthma 09/18/2015  . History of lymphoma 09/18/2015  . Hypertensive emergency without congestive heart failure   . Left renal artery stenosis ([place]) 08/23/2014  . 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 Headache above the eye region 08/16/2014  . Blurry vision 08/16/2014  . Perceived hearing changes 08/16/2014  . Snoring 08/16/2014  . Witnessed apneic spells 08/16/2014  . Excessive daytime sleepiness 08/16/2014  . Nocturnal headaches 08/16/2014    is allergic to compazine [prochlorperazine].  MEDICAL HISTORY: Past Medical History:  Diagnosis Date  . Asthma   . Bell's palsy   . Cardiomegaly   . Family history of breast cancer   . Family history of prostate cancer   . GERD (gastroesophageal reflux disease)   . Hypertension   . 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 OSA (obstructive sleep apnea)   . Renal artery stenosis ([facility])    s/p stent placment to left     SURGICAL HISTORY: Past Surgical History:  Procedure Laterality Date  . CARDIAC CATHETERIZATION N/A 09/18/2015  Procedure: Left Heart Cath and Coronary Angiography;  Surgeon: Leonie Man, MD;  Location: [facility];  Service: Cardiovascular;  Laterality: N/A;  . [facility]   x2  . IR CV LINE INJECTION  07/24/2016  . PERIPHERAL VASCULAR CATHETERIZATION N/A 08/23/2014   Procedure: Renal Angiography;  Surgeon: Adrian Prows, MD;  Location: [facility];  Service: Cardiovascular;  Laterality: N/A;  . RENAL ARTERY STENT Left 08/23/2014  . SKIN BIOPSY  2000    SOCIAL HISTORY: Social History    Social History  . Marital status: Single    Spouse name: N/A  . Number of children: 4  . Years of education: 9   Occupational History  . Unemployed    Social History Main Topics  . Smoking status: Current Every Day Smoker    Packs/day: 0.50    Years: 15.00    Types: Cigarettes  . Smokeless tobacco: Never Used     Comment: 6 cig per day  . Alcohol use No  . Drug use: No  . Sexual activity: Not on file   Other Topics Concern  . Not on file   Social History Narrative   Lives at home with fiance and kids   Caffeine use: 2 cups coffee per day   4 Dr. Malachi Bonds (12oz) per day     FAMILY HISTORY: Family History  Problem Relation Age of Onset  . Hypertension Mother   . Cervical cancer Mother        hysterectomy with ?metastatic disease  . Coronary artery disease Father 69       CABG  . Asthma Father   . Breast cancer Maternal Aunt        age at diagnosis unknown  . Prostate cancer Cousin        paternal first cousin  . Migraines Neg Hx     Review of Systems  Constitutional: Positive for fatigue. Negative for appetite change, chills, diaphoresis, fever and unexpected weight change.  HENT:   Positive for mouth sores, sore throat and voice change. Negative for hearing loss, lump/mass and trouble swallowing.   Eyes: Negative for eye problems and icterus.  Respiratory: Negative for chest tightness, cough and shortness of breath.   Cardiovascular: Negative for chest pain, leg swelling and palpitations.  Gastrointestinal: Positive for diarrhea and nausea. Negative for abdominal distention, abdominal pain, blood in stool, constipation and vomiting.  Endocrine: Negative for hot flashes.  Neurological: Negative for dizziness, extremity weakness, headaches and numbness.  Hematological: Negative for adenopathy. Does not bruise/bleed easily.      PHYSICAL EXAMINATION  ECOG PERFORMANCE STATUS: 2 - Symptomatic, <50% confined to bed  Vitals:   08/16/16 1018  BP: (!) 126/93   Pulse: (!) 103  Resp: 18  Temp: 98.6 F (37 C)    Physical Exam  Constitutional: She is oriented to person, place, and time and well-developed, well-nourished, and in no distress.  HENT:  Head: Normocephalic and atraumatic.  Right Ear: External ear normal.  Left Ear: External ear normal.  Nose: Nose normal.  Thrush noted to posterior pharynx, along with two ulcerations on upper palate  Neck: Neck supple.  Cardiovascular: Normal rate, regular rhythm and normal heart sounds.   Pulmonary/Chest: Effort normal and breath sounds normal. No respiratory distress. She has no wheezes. She has no rales. She exhibits no tenderness.  Abdominal: Soft. Bowel sounds are normal. She exhibits no distension and no mass. There is no tenderness. There is no  rebound and no guarding.  Musculoskeletal: She exhibits no edema.  Lymphadenopathy:    She has no cervical adenopathy.  Neurological: She is alert and oriented to person, place, and time.  Skin: Rash noted.  Maculopapular rash noted on neck and center of chest, as well as on her back, there are scratch marks on her back  Psychiatric: Mood and affect normal.    LABORATORY DATA:  CBC    Component Value Date/Time   WBC 21.4 (H) 08/16/2016 1004   WBC 9.0 04/12/2016 0948   RBC 4.74 08/16/2016 1004   RBC 4.94 04/12/2016 0948   HGB 14.5 08/16/2016 1004   HCT 43.2 08/16/2016 1004   PLT 235 08/16/2016 1004   MCV 91.1 08/16/2016 1004   MCH 30.6 08/16/2016 1004   MCH 29.8 04/12/2016 0948   MCHC 33.6 08/16/2016 1004   MCHC 34.3 04/12/2016 0948   RDW 13.9 08/16/2016 1004   LYMPHSABS 4.0 (H) 08/16/2016 1004   MONOABS 0.1 08/16/2016 1004   EOSABS 0.2 08/16/2016 1004   BASOSABS 0.1 08/16/2016 1004    CMP     Component Value Date/Time   NA 140 08/16/2016 1004   K 3.5 08/16/2016 1004   CL 111 04/12/2016 0948   CO2 26 08/16/2016 1004   GLUCOSE 95 08/16/2016 1004   BUN 2.7 (L) 08/16/2016 1004   CREATININE 0.8 08/16/2016 1004   CALCIUM 9.7  08/16/2016 1004   PROT 7.1 08/16/2016 1004   ALBUMIN 4.0 08/16/2016 1004   AST 16 08/16/2016 1004   ALT 20 08/16/2016 1004   ALKPHOS 114 08/16/2016 1004   BILITOT <0.22 08/16/2016 1004   GFRNONAA >60 04/12/2016 0948   GFRAA >60 04/12/2016 0948        ASSESSMENT and THERAPY PLAN:   Malignant neoplasm of overlapping sites of right breast in female, estrogen receptor positive ([place]) 07/18/2016: Palpable right breast masses for 1 year; 2 adjacent spiculated masses by ultrasound at 1:00 subareolar 4.2 cm mass; 3 satellite nodules at 9:00; 2 abnormal lymph nodes; biopsy of the 2 masses and the lymph node showed similar-appearing 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  Recommendationbased on multidisciplinary tumor board: 1. Neoadjuvant chemotherapy with Taxotereand Cytoxan every 3 weeks4 2. Followed by breast conserving surgery with sentinel lymph node study vs targeted axillary dissection 3. Followed by adjuvant radiation therapy -------------------------------------------------------------------------------- Current treatment: Cycle 1 day 8 Taxotere Cytoxan Chemotherapy toxicities:  1. Mucositis Thrush: Counseled patient on mouth rinses, oral hygeine and care, Fluconazole 278m x 5 days and Magic mouthwash added, education given to patient in AVS. 2. Rash: Likely from reaction to compazine, prescribed steroid ointment 3. Diarrhea: Gave handout on diet recommendations, To start with imodium, may require Lomotil if remains uncontrolled.  She is hydrating herself well.    Labs are normal, I reviewed these with her in detail.  She will return on 08/30/16 for labs, evaluation by Dr. GLindi Adieand cycle 2 of chemotherapy.   A total of (30) minutes of face-to-face time was spent with this patient with greater than 50% of that time in  counseling and care-coordination.   All questions were answered. The patient knows to call the clinic with any problems, questions or concerns. We can certainly see the patient much sooner if necessary. This note was electronically signed. LScot Dock NP 08/16/2016

## 2016-08-16 NOTE — Assessment & Plan Note (Addendum)
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  Recommendationbased on multidisciplinary tumor board: 1. Neoadjuvant chemotherapy with Taxotereand Cytoxan every 3 weeks4 2. Followed by breast conserving surgery with sentinel lymph node study vs targeted axillary dissection 3. Followed by adjuvant radiation therapy -------------------------------------------------------------------------------- Current treatment: Cycle 1 day 8 Taxotere Cytoxan Chemotherapy toxicities:  1. Mucositis Thrush: Counseled patient on mouth rinses, oral hygeine and care, Fluconazole 276m x 5 days and Magic mouthwash added, education given to patient in AVS. 2. Rash: Likely from reaction to compazine, prescribed steroid ointment 3. Diarrhea: Gave handout on diet recommendations, To start with imodium, may require Lomotil if remains uncontrolled.  She is hydrating herself well.    Labs are normal, I reviewed these with her in detail.  She will return on 08/30/16 for labs, evaluation by Dr. GLindi Adieand cycle 2 of chemotherapy.

## 2016-08-23 ENCOUNTER — Telehealth: Payer: Self-pay | Admitting: Genetic Counselor

## 2016-08-23 ENCOUNTER — Ambulatory Visit: Payer: Self-pay | Admitting: Genetic Counselor

## 2016-08-23 ENCOUNTER — Encounter: Payer: Self-pay | Admitting: Genetic Counselor

## 2016-08-23 DIAGNOSIS — Z8042 Family history of malignant neoplasm of prostate: Secondary | ICD-10-CM

## 2016-08-23 DIAGNOSIS — Z1379 Encounter for other screening for genetic and chromosomal anomalies: Secondary | ICD-10-CM

## 2016-08-23 DIAGNOSIS — C50811 Malignant neoplasm of overlapping sites of right female breast: Secondary | ICD-10-CM

## 2016-08-23 DIAGNOSIS — Z8579 Personal history of other malignant neoplasms of lymphoid, hematopoietic and related tissues: Secondary | ICD-10-CM

## 2016-08-23 DIAGNOSIS — Z803 Family history of malignant neoplasm of breast: Secondary | ICD-10-CM

## 2016-08-23 DIAGNOSIS — Z170 Estrogen receptor positive status [ER+]: Secondary | ICD-10-CM

## 2016-08-23 NOTE — Telephone Encounter (Signed)
Revealed negative genetic testing.  Discussed that we do not know why she has breast cancer or why there is cancer in the family. It could be due to a different gene that we are not testing, or maybe our current technology may not be able to pick something up.  It will be important for her to keep in contact with genetics to keep up with whether additional testing may be needed.  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  Discussed that this will not change her medical management.

## 2016-08-23 NOTE — Progress Notes (Signed)
HPI: Ms. Harsha was previously seen in the [facility] clinic due to a personal and family history of cancer and concerns regarding a hereditary predisposition to cancer. Please refer to our prior cancer genetics clinic note for more information regarding Ms. Hogg's medical, social and family histories, and our assessment and recommendations, at the time. Ms. Chilton recent genetic test results were disclosed to her, as were recommendations warranted by these results. These results and recommendations are discussed in more detail below.  CANCER HISTORY:    Malignant neoplasm of overlapping sites of right breast in female, estrogen receptor positive ([place])   07/18/2016 Initial Diagnosis    Palpable right breast masses for 1 year; 2 adjacent spiculated masses by ultrasound at 1:00 subareolar 4.2 cm mass; 3 satellite nodules at 9:00; 2 abnormal lymph nodes; biopsy of the 2 masses and the lymph node showed similar-appearing grade 3 IDC ER 100% PR 95% Ki-67 20-30%, HER-2 negative ratio 1.57; T2 N1 stage IIB (AJCC 8)      08/09/2016 -  Neo-Adjuvant Chemotherapy    Taxotere and Cytoxan every 3 weeks 4 cycles      08/13/2016 Genetic Testing    Genetic testing is pending      08/22/2016 Genetic Testing    BRCA1 c.1802A>G VUS identified on the common hereditary cancer panel.  The Hereditary Gene Panel offered by [facility] includes sequencing and/or deletion duplication testing of the following 46 genes: APC, ATM, AXIN2, BARD1, BMPR1A, BRCA1, BRCA2, BRIP1, CDH1, CDKN2A (p14ARF), CDKN2A (p16INK4a), CHEK2, CTNNA1, DICER1, EPCAM (Deletion/duplication testing only), GREM1 (promoter region deletion/duplication testing only), KIT, MEN1, MLH1, MSH2, MSH3, MSH6, MUTYH, NBN, NF1, NHTL1, PALB2, PDGFRA, PMS2, POLD1, POLE, PTEN, RAD50, RAD51C, RAD51D, SDHB, SDHC, SDHD, SMAD4, SMARCA4. STK11, TP53, TSC1, TSC2, and VHL.  The following genes were evaluated for sequence changes only: SDHA and HOXB13 c.251G>A  variant only.  The report date is Aug 22, 2016.        FAMILY HISTORY:  We obtained a detailed, 4-generation family history.  Significant diagnoses are listed below: Family History  Problem Relation Age of Onset  . Hypertension Mother   . Cervical cancer Mother        hysterectomy with ?metastatic disease  . Coronary artery disease Father 39       CABG  . Asthma Father   . Breast cancer Maternal Aunt        age at diagnosis unknown  . Prostate cancer Cousin        paternal first cousin  . Migraines Neg Hx     The patient has four children, two girls and two boys, all who are cancer free.  She has two full sisters and a full brother who are cancer free.  She has a maternal half brother and a paternal half brother who are cancer free.  The patient's parents are both deceased. Her father died of heart failure at 68, and her mother died of possible cervical vs endometrial cancer at 33.  Her mother had 61 siblings, one sister had breast cancer in her 23's.  There is no additional cancer history on the maternal side.  The patient's father had many siblings, none reportedly had cancer.  One paternal cousin has prostate cancer.  Ms. Huffstetler is unaware of previous family history of genetic testing for hereditary cancer risks. Patient's maternal ancestors are of African American descent, and paternal ancestors are of African American descent. There is no reported Ashkenazi Jewish ancestry. There is no known consanguinity.  GENETIC TEST RESULTS: Genetic testing reported out on Aug 22, 2016 through the Common Hereditary cancer panel found no deleterious mutations.  The Hereditary Gene Panel offered by [facility] includes sequencing and/or deletion duplication testing of the following 46 genes: APC, ATM, AXIN2, BARD1, BMPR1A, BRCA1, BRCA2, BRIP1, CDH1, CDKN2A (p14ARF), CDKN2A (p16INK4a), CHEK2, CTNNA1, DICER1, EPCAM (Deletion/duplication testing only), GREM1 (promoter region deletion/duplication  testing only), KIT, MEN1, MLH1, MSH2, MSH3, MSH6, MUTYH, NBN, NF1, NHTL1, PALB2, PDGFRA, PMS2, POLD1, POLE, PTEN, RAD50, RAD51C, RAD51D, SDHB, SDHC, SDHD, SMAD4, SMARCA4. STK11, TP53, TSC1, TSC2, and VHL.  The following genes were evaluated for sequence changes only: SDHA and HOXB13 c.251G>A variant only.  The test report has been scanned into EPIC and is located under the Molecular Pathology section of the Results Review tab.   We discussed with Ms. Sutphin that since the current genetic testing is not perfect, it is possible there may be a gene mutation in one of these genes that current testing cannot detect, but that chance is small. We also discussed, that it is possible that another gene that has not yet been discovered, or that we have not yet tested, is responsible for the cancer diagnoses in the family, and it is, therefore, important to remain in touch with cancer genetics in the future so that we can continue to offer Ms. Colee the most up to date genetic testing.   Genetic testing did detect a Variant of Unknown Significance in the BRCA1 gene called c.1802A>G. At this time, it is unknown if this variant is associated with increased cancer risk or if this is a normal finding, but most variants such as this get reclassified to being inconsequential. It should not be used to make medical management decisions. With time, we suspect the lab will determine the significance of this variant, if any. If we do learn more about it, we will try to contact Ms. Hansson to discuss it further. However, it is important to stay in touch with [place] periodically and keep the address and phone number up to date.     CANCER SCREENING RECOMMENDATIONS:  This result is reassuring and indicates that Ms. Cleland likely does not have an increased risk for a future cancer due to a mutation in one of these genes. This normal test also suggests that Ms. Mosey's cancer was most likely not due to an inherited predisposition  associated with one of these genes.  Most cancers happen by chance and this negative test suggests that her cancer falls into this category.  We, therefore, recommended she continue to follow the cancer management and screening guidelines provided by her oncology and primary healthcare provider.   RECOMMENDATIONS FOR FAMILY MEMBERS: Women in this family might be at some increased risk of developing cancer, over the general population risk, simply due to the family history of cancer. We recommended women in this family have a yearly mammogram beginning at age 38, or 63 years younger than the earliest onset of cancer, an annual clinical breast exam, and perform monthly breast self-exams. Women in this family should also have a gynecological exam as recommended by their primary provider. All family members should have a colonoscopy by age 68.  FOLLOW-UP: Lastly, we discussed with Ms. Edling that cancer genetics is a rapidly advancing field and it is possible that new genetic tests will be appropriate for her and/or her family members in the future. We encouraged her to remain in contact with cancer genetics on an annual basis so we can update  her personal and family histories and let her know of advances in cancer genetics that may benefit this family.   Our contact number was provided. Ms. Dade questions were answered to her satisfaction, and she knows she is welcome to call us at anytime with additional questions or concerns.   Roma Kayser, MS, [facility] Certified Genetic Counselor [email or web address]

## 2016-08-30 ENCOUNTER — Encounter: Payer: Self-pay | Admitting: Hematology and Oncology

## 2016-08-30 ENCOUNTER — Ambulatory Visit (HOSPITAL_BASED_OUTPATIENT_CLINIC_OR_DEPARTMENT_OTHER): Payer: Medicaid Other | Admitting: Hematology and Oncology

## 2016-08-30 ENCOUNTER — Ambulatory Visit: Payer: Medicaid Other

## 2016-08-30 ENCOUNTER — Other Ambulatory Visit (HOSPITAL_BASED_OUTPATIENT_CLINIC_OR_DEPARTMENT_OTHER): Payer: Medicaid Other

## 2016-08-30 ENCOUNTER — Ambulatory Visit (HOSPITAL_BASED_OUTPATIENT_CLINIC_OR_DEPARTMENT_OTHER): Payer: Medicaid Other

## 2016-08-30 DIAGNOSIS — Z170 Estrogen receptor positive status [ER+]: Principal | ICD-10-CM

## 2016-08-30 DIAGNOSIS — C50811 Malignant neoplasm of overlapping sites of right female breast: Secondary | ICD-10-CM

## 2016-08-30 DIAGNOSIS — Z5111 Encounter for antineoplastic chemotherapy: Secondary | ICD-10-CM | POA: Diagnosis not present

## 2016-08-30 DIAGNOSIS — Z95828 Presence of other vascular implants and grafts: Secondary | ICD-10-CM

## 2016-08-30 LAB — CBC WITH DIFFERENTIAL/PLATELET
BASO%: 1.1 % (ref 0.0–2.0)
Basophils Absolute: 0.2 10*3/uL — ABNORMAL HIGH (ref 0.0–0.1)
EOS ABS: 0 10*3/uL (ref 0.0–0.5)
EOS%: 0.2 % (ref 0.0–7.0)
HCT: 38.9 % (ref 34.8–46.6)
HEMOGLOBIN: 12.9 g/dL (ref 11.6–15.9)
LYMPH%: 25.2 % (ref 14.0–49.7)
MCH: 30.4 pg (ref 25.1–34.0)
MCHC: 33.1 g/dL (ref 31.5–36.0)
MCV: 91.8 fL (ref 79.5–101.0)
MONO#: 1.1 10*3/uL — AB (ref 0.1–0.9)
MONO%: 7 % (ref 0.0–14.0)
NEUT%: 66.5 % (ref 38.4–76.8)
NEUTROS ABS: 10.8 10*3/uL — AB (ref 1.5–6.5)
PLATELETS: 323 10*3/uL (ref 145–400)
RBC: 4.24 10*6/uL (ref 3.70–5.45)
RDW: 14.5 % (ref 11.2–14.5)
WBC: 16.2 10*3/uL — AB (ref 3.9–10.3)
lymph#: 4.1 10*3/uL — ABNORMAL HIGH (ref 0.9–3.3)

## 2016-08-30 LAB — COMPREHENSIVE METABOLIC PANEL
ALBUMIN: 3.7 g/dL (ref 3.5–5.0)
ALK PHOS: 107 U/L (ref 40–150)
ALT: 9 U/L (ref 0–55)
AST: 9 U/L (ref 5–34)
Anion Gap: 12 mEq/L — ABNORMAL HIGH (ref 3–11)
BILIRUBIN TOTAL: 0.26 mg/dL (ref 0.20–1.20)
BUN: 6.2 mg/dL — ABNORMAL LOW (ref 7.0–26.0)
CO2: 21 mEq/L — ABNORMAL LOW (ref 22–29)
CREATININE: 0.7 mg/dL (ref 0.6–1.1)
Calcium: 9.5 mg/dL (ref 8.4–10.4)
Chloride: 109 mEq/L (ref 98–109)
GLUCOSE: 130 mg/dL (ref 70–140)
Potassium: 2.9 mEq/L — CL (ref 3.5–5.1)
SODIUM: 141 meq/L (ref 136–145)
TOTAL PROTEIN: 6.7 g/dL (ref 6.4–8.3)

## 2016-08-30 MED ORDER — POTASSIUM CHLORIDE CRYS ER 20 MEQ PO TBCR: 20.0000 meq | EXTENDED_RELEASE_TABLET | Freq: Every day | ORAL | 0 refills | Status: DC

## 2016-08-30 MED ORDER — SODIUM CHLORIDE 0.9% FLUSH
10.0000 mL | INTRAVENOUS | Status: DC | PRN
  Administered 2016-08-30: 10 mL via INTRAVENOUS
  Filled 2016-08-30: qty 10

## 2016-08-30 MED ORDER — SODIUM CHLORIDE 0.9 % IV SOLN
Freq: Once | INTRAVENOUS | Status: AC
  Administered 2016-08-30: 11:00:00 via INTRAVENOUS

## 2016-08-30 MED ORDER — DOCETAXEL CHEMO INJECTION 160 MG/16ML
75.0000 mg/m2 | Freq: Once | INTRAVENOUS | Status: AC
  Administered 2016-08-30: 140 mg via INTRAVENOUS
  Filled 2016-08-30: qty 14

## 2016-08-30 MED ORDER — HEPARIN SOD (PORK) LOCK FLUSH 100 UNIT/ML IV SOLN
500.0000 [IU] | Freq: Once | INTRAVENOUS | Status: AC | PRN
  Administered 2016-08-30: 500 [IU]
  Filled 2016-08-30: qty 5

## 2016-08-30 MED ORDER — PALONOSETRON HCL INJECTION 0.25 MG/5ML
0.2500 mg | Freq: Once | INTRAVENOUS | Status: AC
  Administered 2016-08-30: 0.25 mg via INTRAVENOUS

## 2016-08-30 MED ORDER — CYCLOPHOSPHAMIDE CHEMO INJECTION 1 GM
600.0000 mg/m2 | Freq: Once | INTRAMUSCULAR | Status: AC
  Administered 2016-08-30: 1100 mg via INTRAVENOUS
  Filled 2016-08-30: qty 55

## 2016-08-30 MED ORDER — DEXAMETHASONE SODIUM PHOSPHATE 10 MG/ML IJ SOLN
INTRAMUSCULAR | Status: AC
Start: 2016-08-30 — End: 2016-08-30
  Filled 2016-08-30: qty 1

## 2016-08-30 MED ORDER — DEXAMETHASONE SODIUM PHOSPHATE 10 MG/ML IJ SOLN
10.0000 mg | Freq: Once | INTRAMUSCULAR | Status: AC
  Administered 2016-08-30: 10 mg via INTRAVENOUS

## 2016-08-30 MED ORDER — PALONOSETRON HCL INJECTION 0.25 MG/5ML
INTRAVENOUS | Status: AC
  Filled 2016-08-30: qty 5

## 2016-08-30 MED ORDER — SODIUM CHLORIDE 0.9% FLUSH
10.0000 mL | INTRAVENOUS | Status: DC | PRN
Start: 2016-08-30 — End: 2016-08-30
  Administered 2016-08-30: 10 mL
  Filled 2016-08-30: qty 10

## 2016-08-30 MED ORDER — PEGFILGRASTIM 6 MG/0.6ML ~~LOC~~ PSKT
6.0000 mg | PREFILLED_SYRINGE | Freq: Once | SUBCUTANEOUS | Status: AC
  Administered 2016-08-30: 6 mg via SUBCUTANEOUS
  Filled 2016-08-30: qty 0.6

## 2016-08-30 NOTE — Progress Notes (Signed)
Patient Care Team: Javier Docker, MD as PCP - General (Internal Medicine) Erroll Luna, MD as Consulting Physician (General Surgery) Nicholas Lose, MD as Consulting Physician (Hematology and Oncology) Kyung Rudd, MD as Consulting Physician (Radiation Oncology) Delice Bison Charlestine Massed, NP as Nurse Practitioner (Hematology and Oncology)  DIAGNOSIS:  Encounter Diagnosis  Name Primary?  . Malignant neoplasm of overlapping sites of right breast in female, estrogen receptor positive ([place])     SUMMARY OF ONCOLOGIC HISTORY:   Malignant neoplasm of overlapping sites of right breast in female, estrogen receptor positive ([place])   07/18/2016 Initial Diagnosis    Palpable right breast masses for 1 year; 2 adjacent spiculated masses by ultrasound at 1:00 subareolar 4.2 cm mass; 3 satellite nodules at 9:00; 2 abnormal lymph nodes; biopsy of the 2 masses and the lymph node showed similar-appearing grade 3 IDC ER 100% PR 95% Ki-67 20-30%, HER-2 negative ratio 1.57; T2 N1 stage IIB (AJCC 8)      08/09/2016 -  Neo-Adjuvant Chemotherapy    Taxotere and Cytoxan every 3 weeks 4 cycles      08/13/2016 Genetic Testing    Genetic testing is pending      08/22/2016 Genetic Testing    BRCA1 c.1802A>G VUS identified on the common hereditary cancer panel.  The Hereditary Gene Panel offered by [facility] includes sequencing and/or deletion duplication testing of the following 46 genes: APC, ATM, AXIN2, BARD1, BMPR1A, BRCA1, BRCA2, BRIP1, CDH1, CDKN2A (p14ARF), CDKN2A (p16INK4a), CHEK2, CTNNA1, DICER1, EPCAM (Deletion/duplication testing only), GREM1 (promoter region deletion/duplication testing only), KIT, MEN1, MLH1, MSH2, MSH3, MSH6, MUTYH, NBN, NF1, NHTL1, PALB2, PDGFRA, PMS2, POLD1, POLE, PTEN, RAD50, RAD51C, RAD51D, SDHB, SDHC, SDHD, SMAD4, SMARCA4. STK11, TP53, TSC1, TSC2, and VHL.  The following genes were evaluated for sequence changes only: SDHA and HOXB13 c.251G>A variant only.  The report date is  Aug 22, 2016.        CHIEF COMPLIANT: Cycle 2 Taxotere and Cytoxan  INTERVAL HISTORY: Angellina Ferdinand is a 46 year old with above-mentioned history of right breast cancer currently on neoadjuvant chemotherapy with Taxotere and Cytoxan. Today is cycle 2 of treatment. After cycle 1 she had generalized fatigue and weakness accompanied by diarrhea. She was prescribed Imodium and she started taking it and the diarrhea had improved.  however today her potassium still remains a 2.9. She denied any nausea vomiting. She denies neuropathy.  REVIEW OF SYSTEMS:   Constitutional: Denies fevers, chills or abnormal weight loss Eyes: Denies blurriness of vision Ears, nose, mouth, throat, and face: Denies mucositis or sore throat Respiratory: Denies cough, dyspnea or wheezes Cardiovascular: Denies palpitation, chest discomfort Gastrointestinal:  Intermittent diarrhea Skin: Denies abnormal skin rashes Lymphatics: Denies new lymphadenopathy or easy bruising Neurological:Denies numbness, tingling or new weaknesses Behavioral/Psych: Mood is stable, no new changes  Extremities: No lower extremity edema Breast:  denies any pain or lumps or nodules in either breasts All other systems were reviewed with the patient and are negative.  I have reviewed the past medical history, past surgical history, social history and family history with the patient and they are unchanged from previous note.  ALLERGIES:  is allergic to compazine [prochlorperazine].  MEDICATIONS:  Current Outpatient Prescriptions  Medication Sig Dispense Refill  . albuterol (PROVENTIL) (2.5 MG/3ML) 0.083% nebulizer solution Take 3 mLs (2.5 mg total) by nebulization every 6 (six) hours as needed for wheezing or shortness of breath. 75 mL 12  . amLODipine (NORVASC) 10 MG tablet Take 10 mg by mouth daily.    Marland Kitchen aspirin  81 MG chewable tablet Chew 81 mg by mouth every morning.     Marland Kitchen atorvastatin (LIPITOR) 80 MG tablet Take 1 tablet (80 mg total) by  mouth daily at 6 PM. 30 tablet 0  . betamethasone valerate ointment (VALISONE) 0.1 % Apply 1 application topically 2 (two) times daily. 30 g 0  . clopidogrel (PLAVIX) 75 MG tablet Take 1 tablet (75 mg total) by mouth daily with breakfast. 30 tablet 1  . dexamethasone (DECADRON) 4 MG tablet Take 1 tablet (4 mg total) by mouth daily. Take 1 tablet the day before Taxotere. Then 1 tablet again the day after chemo. 8 tablet 0  . fluconazole (DIFLUCAN) 200 MG tablet Take 1 tablet (200 mg total) by mouth daily. 5 tablet 2  . isosorbide mononitrate (IMDUR) 30 MG 24 hr tablet Take 1 tablet (30 mg total) by mouth daily. 30 tablet 0  . lidocaine-prilocaine (EMLA) cream Apply to affected area once 30 g 3  . LORazepam (ATIVAN) 0.5 MG tablet Take 1 tablet (0.5 mg total) by mouth at bedtime. 30 tablet 0  . losartan (COZAAR) 50 MG tablet Take 50 mg by mouth daily.  3  . magic mouthwash SOLN Take 5 mLs by mouth 4 (four) times daily as needed for mouth pain. 240 mL 0  . metoprolol succinate (TOPROL XL) 100 MG 24 hr tablet Take 1 tablet (100 mg total) by mouth daily. Take with or immediately following a meal. 30 tablet 0  . mometasone-formoterol (DULERA) 200-5 MCG/ACT AERO Inhale 2 puffs into the lungs 2 (two) times daily. 1 Inhaler 1  . montelukast (SINGULAIR) 10 MG tablet Take 1 tablet (10 mg total) by mouth at bedtime. 30 tablet 0  . nitroGLYCERIN (NITROSTAT) 0.4 MG SL tablet Place 0.4 mg under the tongue every 5 (five) minutes as needed for chest pain.    Marland Kitchen ondansetron (ZOFRAN) 8 MG tablet Take 1 tablet (8 mg total) by mouth 2 (two) times daily as needed for refractory nausea / vomiting. Start on day 3 after chemo. 30 tablet 1   No current facility-administered medications for this visit.     PHYSICAL EXAMINATION: ECOG PERFORMANCE STATUS: 1 - Symptomatic but completely ambulatory  Vitals:   08/30/16 0940  BP: 135/71  Pulse: 62  Resp: 18  Temp: 97.8 F (36.6 C)   Filed Weights   08/30/16 0940    Weight: 165 lb 14.4 oz (75.3 kg)    GENERAL:alert, no distress and comfortable SKIN: skin color, texture, turgor are normal, no rashes or significant lesions EYES: normal, Conjunctiva are pink and non-injected, sclera clear OROPHARYNX:no exudate, no erythema and lips, buccal mucosa, and tongue normal  NECK: supple, thyroid normal size, non-tender, without nodularity LYMPH:  no palpable lymphadenopathy in the cervical, axillary or inguinal LUNGS: clear to auscultation and percussion with normal breathing effort HEART: regular rate & rhythm and no murmurs and no lower extremity edema ABDOMEN:abdomen soft, non-tender and normal bowel sounds MUSCULOSKELETAL:no cyanosis of digits and no clubbing  NEURO: alert & oriented x 3 with fluent speech, no focal motor/sensory deficits EXTREMITIES: No lower extremity edema  LABORATORY DATA:  I have reviewed the data as listed   Chemistry      Component Value Date/Time   NA 141 08/30/2016 0911   K 2.9 (LL) 08/30/2016 0911   CL 111 04/12/2016 0948   CO2 21 (L) 08/30/2016 0911   BUN 6.2 (L) 08/30/2016 0911   CREATININE 0.7 08/30/2016 0911      Component Value Date/Time  CALCIUM 9.5 08/30/2016 0911   ALKPHOS 107 08/30/2016 0911   AST 9 08/30/2016 0911   ALT 9 08/30/2016 0911   BILITOT 0.26 08/30/2016 0911       Lab Results  Component Value Date   WBC 16.2 (H) 08/30/2016   HGB 12.9 08/30/2016   HCT 38.9 08/30/2016   MCV 91.8 08/30/2016   PLT 323 08/30/2016   NEUTROABS 10.8 (H) 08/30/2016    ASSESSMENT & PLAN:  Malignant neoplasm of overlapping sites of right breast in female, estrogen receptor positive ([place]) 07/18/2016: Palpable right breast masses for 1 year; 2 adjacent spiculated masses by ultrasound at 1:00 subareolar 4.2 cm mass; 3 satellite nodules at 9:00; 2 abnormal lymph nodes; biopsy of the 2 masses and the lymph node showed similar-appearing 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  Recommendationbased on multidisciplinary tumor board: 1. Neoadjuvant chemotherapy with Taxotereand Cytoxan every 3 weeks4 2. Followed by breast conserving surgery with sentinel lymph node study vs targeted axillary dissection 3. Followed by adjuvant radiation therapy -------------------------------------------------------------------------------- Current treatment: Cycle 2 day 1 Taxotere Cytoxan Chemotherapy toxicities:  1. Mucositis Thrush: Treated with fluconazole 246m x 5 days and Magic mouthwash 2. Rash: Likely from reaction to compazine  3. Diarrhea: Imodium and hydration 4. Hypokalemia: Send prescription for oral potassium replacement therapy   Labs reviewed Monitoring closely for toxicities Return to clinic in 3 weeks for cycle 3    I spent 25 minutes talking to the patient of which more than half was spent in counseling and coordination of care.  No orders of the defined types were placed in this encounter.  The patient has a good understanding of the overall plan. she agrees with it. she will call with any problems that may develop before the next visit here.   GRulon Eisenmenger MD 08/30/16

## 2016-08-30 NOTE — Assessment & Plan Note (Signed)
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  Recommendationbased on multidisciplinary tumor board: 1. Neoadjuvant chemotherapy with Taxotereand Cytoxan every 3 weeks4 2. Followed by breast conserving surgery with sentinel lymph node study vs targeted axillary dissection 3. Followed by adjuvant radiation therapy -------------------------------------------------------------------------------- Current treatment: Cycle 2 day 1 Taxotere Cytoxan Chemotherapy toxicities:  1. Mucositis Thrush: Treated with fluconazole 260m x 5 days and Magic mouthwash 2. Rash: Likely from reaction to compazine  3. Diarrhea: Imodium and hydration  Labs reviewed Monitoring closely for toxicities Return to clinic in 3 weeks for cycle 3

## 2016-08-30 NOTE — Patient Instructions (Signed)

## 2016-09-03 ENCOUNTER — Encounter: Payer: Self-pay | Admitting: *Deleted

## 2016-09-20 ENCOUNTER — Encounter: Payer: Self-pay | Admitting: *Deleted

## 2016-09-20 ENCOUNTER — Encounter: Payer: Self-pay | Admitting: Hematology and Oncology

## 2016-09-20 ENCOUNTER — Ambulatory Visit (HOSPITAL_BASED_OUTPATIENT_CLINIC_OR_DEPARTMENT_OTHER): Payer: Medicaid Other

## 2016-09-20 ENCOUNTER — Ambulatory Visit (HOSPITAL_BASED_OUTPATIENT_CLINIC_OR_DEPARTMENT_OTHER): Payer: Medicaid Other | Admitting: Hematology and Oncology

## 2016-09-20 ENCOUNTER — Other Ambulatory Visit (HOSPITAL_BASED_OUTPATIENT_CLINIC_OR_DEPARTMENT_OTHER): Payer: Medicaid Other

## 2016-09-20 ENCOUNTER — Ambulatory Visit: Payer: Medicaid Other

## 2016-09-20 DIAGNOSIS — C50811 Malignant neoplasm of overlapping sites of right female breast: Secondary | ICD-10-CM | POA: Diagnosis not present

## 2016-09-20 DIAGNOSIS — Z170 Estrogen receptor positive status [ER+]: Secondary | ICD-10-CM | POA: Diagnosis not present

## 2016-09-20 DIAGNOSIS — Z5111 Encounter for antineoplastic chemotherapy: Secondary | ICD-10-CM | POA: Diagnosis present

## 2016-09-20 DIAGNOSIS — Z5189 Encounter for other specified aftercare: Secondary | ICD-10-CM | POA: Diagnosis not present

## 2016-09-20 DIAGNOSIS — E876 Hypokalemia: Secondary | ICD-10-CM

## 2016-09-20 DIAGNOSIS — Z95828 Presence of other vascular implants and grafts: Secondary | ICD-10-CM

## 2016-09-20 LAB — COMPREHENSIVE METABOLIC PANEL
ALT: 9 U/L (ref 0–55)
ANION GAP: 9 meq/L (ref 3–11)
AST: 11 U/L (ref 5–34)
Albumin: 3.8 g/dL (ref 3.5–5.0)
Alkaline Phosphatase: 96 U/L (ref 40–150)
BUN: 6.5 mg/dL — ABNORMAL LOW (ref 7.0–26.0)
CHLORIDE: 108 meq/L (ref 98–109)
CO2: 24 meq/L (ref 22–29)
Calcium: 9.9 mg/dL (ref 8.4–10.4)
Creatinine: 0.7 mg/dL (ref 0.6–1.1)
GLUCOSE: 94 mg/dL (ref 70–140)
POTASSIUM: 3.6 meq/L (ref 3.5–5.1)
SODIUM: 140 meq/L (ref 136–145)
Total Bilirubin: 0.34 mg/dL (ref 0.20–1.20)
Total Protein: 6.8 g/dL (ref 6.4–8.3)

## 2016-09-20 LAB — CBC WITH DIFFERENTIAL/PLATELET
BASO%: 0.4 % (ref 0.0–2.0)
BASOS ABS: 0.1 10*3/uL (ref 0.0–0.1)
EOS ABS: 0 10*3/uL (ref 0.0–0.5)
EOS%: 0.3 % (ref 0.0–7.0)
HCT: 36.6 % (ref 34.8–46.6)
HGB: 12.1 g/dL (ref 11.6–15.9)
LYMPH#: 3.1 10*3/uL (ref 0.9–3.3)
LYMPH%: 24.3 % (ref 14.0–49.7)
MCH: 30.6 pg (ref 25.1–34.0)
MCHC: 33.2 g/dL (ref 31.5–36.0)
MCV: 92.1 fL (ref 79.5–101.0)
MONO#: 1.4 10*3/uL — AB (ref 0.1–0.9)
MONO%: 11.2 % (ref 0.0–14.0)
NEUT#: 8.1 10*3/uL — ABNORMAL HIGH (ref 1.5–6.5)
NEUT%: 63.8 % (ref 38.4–76.8)
PLATELETS: 276 10*3/uL (ref 145–400)
RBC: 3.97 10*6/uL (ref 3.70–5.45)
RDW: 16.1 % — ABNORMAL HIGH (ref 11.2–14.5)
WBC: 12.8 10*3/uL — ABNORMAL HIGH (ref 3.9–10.3)

## 2016-09-20 MED ORDER — SODIUM CHLORIDE 0.9 % IV SOLN
Freq: Once | INTRAVENOUS | Status: AC
  Administered 2016-09-20: 11:00:00 via INTRAVENOUS

## 2016-09-20 MED ORDER — PALONOSETRON HCL INJECTION 0.25 MG/5ML
INTRAVENOUS | Status: AC
  Filled 2016-09-20: qty 5

## 2016-09-20 MED ORDER — SODIUM CHLORIDE 0.9% FLUSH
10.0000 mL | INTRAVENOUS | Status: DC | PRN
  Administered 2016-09-20: 10 mL via INTRAVENOUS
  Filled 2016-09-20: qty 10

## 2016-09-20 MED ORDER — DEXAMETHASONE SODIUM PHOSPHATE 10 MG/ML IJ SOLN
10.0000 mg | Freq: Once | INTRAMUSCULAR | Status: AC
  Administered 2016-09-20: 10 mg via INTRAVENOUS

## 2016-09-20 MED ORDER — HEPARIN SOD (PORK) LOCK FLUSH 100 UNIT/ML IV SOLN
500.0000 [IU] | Freq: Once | INTRAVENOUS | Status: AC | PRN
  Administered 2016-09-20: 500 [IU]
  Filled 2016-09-20: qty 5

## 2016-09-20 MED ORDER — PEGFILGRASTIM 6 MG/0.6ML ~~LOC~~ PSKT
6.0000 mg | PREFILLED_SYRINGE | Freq: Once | SUBCUTANEOUS | Status: AC
  Administered 2016-09-20: 6 mg via SUBCUTANEOUS
  Filled 2016-09-20: qty 0.6

## 2016-09-20 MED ORDER — SODIUM CHLORIDE 0.9 % IV SOLN
600.0000 mg/m2 | Freq: Once | INTRAVENOUS | Status: AC
  Administered 2016-09-20: 1100 mg via INTRAVENOUS
  Filled 2016-09-20: qty 55

## 2016-09-20 MED ORDER — DOCETAXEL CHEMO INJECTION 160 MG/16ML
75.0000 mg/m2 | Freq: Once | INTRAVENOUS | Status: AC
  Administered 2016-09-20: 140 mg via INTRAVENOUS
  Filled 2016-09-20: qty 14

## 2016-09-20 MED ORDER — PALONOSETRON HCL INJECTION 0.25 MG/5ML
0.2500 mg | Freq: Once | INTRAVENOUS | Status: AC
  Administered 2016-09-20: 0.25 mg via INTRAVENOUS

## 2016-09-20 MED ORDER — BETAMETHASONE VALERATE 0.1 % EX OINT: 1.0000 "application " | TOPICAL_OINTMENT | Freq: Two times a day (BID) | CUTANEOUS | 1 refills | Status: DC

## 2016-09-20 MED ORDER — SODIUM CHLORIDE 0.9% FLUSH
10.0000 mL | INTRAVENOUS | Status: DC | PRN
  Administered 2016-09-20: 10 mL
  Filled 2016-09-20: qty 10

## 2016-09-20 MED ORDER — DEXAMETHASONE SODIUM PHOSPHATE 10 MG/ML IJ SOLN
INTRAMUSCULAR | Status: AC
  Filled 2016-09-20: qty 1

## 2016-09-20 NOTE — Patient Instructions (Addendum)
[facility] Discharge Instructions for Patients Receiving Chemotherapy  Today you received the following chemotherapy agents Taxotere and Cytoxan   To help prevent nausea and vomiting after your treatment, we encourage you to take your nausea medication as directed. No Zofran for 3 days.   If you develop nausea and vomiting that is not controlled by your nausea medication, call the clinic.   BELOW ARE SYMPTOMS THAT SHOULD BE REPORTED IMMEDIATELY:  *FEVER GREATER THAN 100.5 F  *CHILLS WITH OR WITHOUT FEVER  NAUSEA AND VOMITING THAT IS NOT CONTROLLED WITH YOUR NAUSEA MEDICATION  *UNUSUAL SHORTNESS OF BREATH  *UNUSUAL BRUISING OR BLEEDING  TENDERNESS IN MOUTH AND THROAT WITH OR WITHOUT PRESENCE OF ULCERS  *URINARY PROBLEMS  *BOWEL PROBLEMS  UNUSUAL RASH Items with * indicate a potential emergency and should be followed up as soon as possible.  Feel free to call the clinic you have any questions or concerns. The clinic phone number is [phone].  Please show the [place] at check-in to the Emergency Department and triage nurse.

## 2016-09-20 NOTE — Assessment & Plan Note (Signed)
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  Recommendationbased on multidisciplinary tumor board: 1. Neoadjuvant chemotherapy with Taxotereand Cytoxan every 3 weeks4 2. Followed by breast conserving surgery with sentinel lymph node study vs targeted axillary dissection 3. Followed by adjuvant radiation therapy -------------------------------------------------------------------------------- Current treatment: Cycle 3 day 1Taxotere Cytoxan Chemotherapy toxicities:  1. Mucositis Thrush: Treated with fluconazole 2106m x 5 days and Magic mouthwash 2. Rash: Likely from reaction to compazine  3. Diarrhea: Imodium and hydration 4. Hypokalemia: oral potassium   Labs reviewed Monitoring closely for toxicities Return to clinic in 3 weeks for cycle 4

## 2016-09-20 NOTE — Progress Notes (Signed)
Patient Care Team: Javier Docker, MD as PCP - General (Internal Medicine) Erroll Luna, MD as Consulting Physician (General Surgery) Nicholas Lose, MD as Consulting Physician (Hematology and Oncology) Kyung Rudd, MD as Consulting Physician (Radiation Oncology) Delice Bison Charlestine Massed, NP as Nurse Practitioner (Hematology and Oncology)  DIAGNOSIS:  Encounter Diagnosis  Name Primary?  . Malignant neoplasm of overlapping sites of right breast in female, estrogen receptor positive ([place])     SUMMARY OF ONCOLOGIC HISTORY:   Malignant neoplasm of overlapping sites of right breast in female, estrogen receptor positive ([place])   07/18/2016 Initial Diagnosis    Palpable right breast masses for 1 year; 2 adjacent spiculated masses by ultrasound at 1:00 subareolar 4.2 cm mass; 3 satellite nodules at 9:00; 2 abnormal lymph nodes; biopsy of the 2 masses and the lymph node showed similar-appearing grade 3 IDC ER 100% PR 95% Ki-67 20-30%, HER-2 negative ratio 1.57; T2 N1 stage IIB (AJCC 8)      08/09/2016 -  Neo-Adjuvant Chemotherapy    Taxotere and Cytoxan every 3 weeks 4 cycles      08/13/2016 Genetic Testing    Genetic testing is pending      08/22/2016 Genetic Testing    BRCA1 c.1802A>G VUS identified on the common hereditary cancer panel.  The Hereditary Gene Panel offered by [facility] includes sequencing and/or deletion duplication testing of the following 46 genes: APC, ATM, AXIN2, BARD1, BMPR1A, BRCA1, BRCA2, BRIP1, CDH1, CDKN2A (p14ARF), CDKN2A (p16INK4a), CHEK2, CTNNA1, DICER1, EPCAM (Deletion/duplication testing only), GREM1 (promoter region deletion/duplication testing only), KIT, MEN1, MLH1, MSH2, MSH3, MSH6, MUTYH, NBN, NF1, NHTL1, PALB2, PDGFRA, PMS2, POLD1, POLE, PTEN, RAD50, RAD51C, RAD51D, SDHB, SDHC, SDHD, SMAD4, SMARCA4. STK11, TP53, TSC1, TSC2, and VHL.  The following genes were evaluated for sequence changes only: SDHA and HOXB13 c.251G>A variant only.  The report date is  Aug 22, 2016.        CHIEF COMPLIANT: Cycle 3 Taxotere and Cytoxan  INTERVAL HISTORY: Jaeline Whobrey is a 46 year old with above-mentioned history of 5 right breast cancer currently on neoadjuvant chemotherapy and today is cycle 3 of Taxotere and Cytoxan. She is tolerating it extremely well. She denies any nausea vomiting. She has poor appetite and taste but otherwise able to eat and drink reasonably well.  REVIEW OF SYSTEMS:   Constitutional: Denies fevers, chills or abnormal weight loss Eyes: Denies blurriness of vision Ears, nose, mouth, throat, and face: Denies mucositis or sore throat Respiratory: Denies cough, dyspnea or wheezes Cardiovascular: Denies palpitation, chest discomfort Gastrointestinal:  Denies nausea, heartburn or change in bowel habits Skin: Denies abnormal skin rashes Lymphatics: Denies new lymphadenopathy or easy bruising Neurological:Denies numbness, tingling or new weaknesses Behavioral/Psych: Mood is stable, no new changes  Extremities: No lower extremity edema Breast:  denies any pain or lumps or nodules in either breasts All other systems were reviewed with the patient and are negative.  I have reviewed the past medical history, past surgical history, social history and family history with the patient and they are unchanged from previous note.  ALLERGIES:  is allergic to compazine [prochlorperazine].  MEDICATIONS:  Current Outpatient Prescriptions  Medication Sig Dispense Refill  . albuterol (PROVENTIL) (2.5 MG/3ML) 0.083% nebulizer solution Take 3 mLs (2.5 mg total) by nebulization every 6 (six) hours as needed for wheezing or shortness of breath. 75 mL 12  . amLODipine (NORVASC) 10 MG tablet Take 10 mg by mouth daily.    Marland Kitchen aspirin 81 MG chewable tablet Chew 81 mg by mouth every morning.     Marland Kitchen  atorvastatin (LIPITOR) 80 MG tablet Take 1 tablet (80 mg total) by mouth daily at 6 PM. 30 tablet 0  . betamethasone valerate ointment (VALISONE) 0.1 % Apply 1  application topically 2 (two) times daily. 30 g 0  . clopidogrel (PLAVIX) 75 MG tablet Take 1 tablet (75 mg total) by mouth daily with breakfast. 30 tablet 1  . dexamethasone (DECADRON) 4 MG tablet Take 1 tablet (4 mg total) by mouth daily. Take 1 tablet the day before Taxotere. Then 1 tablet again the day after chemo. 8 tablet 0  . fluconazole (DIFLUCAN) 200 MG tablet Take 1 tablet (200 mg total) by mouth daily. 5 tablet 2  . isosorbide mononitrate (IMDUR) 30 MG 24 hr tablet Take 1 tablet (30 mg total) by mouth daily. 30 tablet 0  . lidocaine-prilocaine (EMLA) cream Apply to affected area once 30 g 3  . LORazepam (ATIVAN) 0.5 MG tablet Take 1 tablet (0.5 mg total) by mouth at bedtime. 30 tablet 0  . losartan (COZAAR) 50 MG tablet Take 50 mg by mouth daily.  3  . magic mouthwash SOLN Take 5 mLs by mouth 4 (four) times daily as needed for mouth pain. 240 mL 0  . metoprolol succinate (TOPROL XL) 100 MG 24 hr tablet Take 1 tablet (100 mg total) by mouth daily. Take with or immediately following a meal. 30 tablet 0  . mometasone-formoterol (DULERA) 200-5 MCG/ACT AERO Inhale 2 puffs into the lungs 2 (two) times daily. 1 Inhaler 1  . montelukast (SINGULAIR) 10 MG tablet Take 1 tablet (10 mg total) by mouth at bedtime. 30 tablet 0  . nitroGLYCERIN (NITROSTAT) 0.4 MG SL tablet Place 0.4 mg under the tongue every 5 (five) minutes as needed for chest pain.    Marland Kitchen ondansetron (ZOFRAN) 8 MG tablet Take 1 tablet (8 mg total) by mouth 2 (two) times daily as needed for refractory nausea / vomiting. Start on day 3 after chemo. 30 tablet 1  . potassium chloride SA (K-DUR,KLOR-CON) 20 MEQ tablet Take 1 tablet (20 mEq total) by mouth daily. 30 tablet 0   No current facility-administered medications for this visit.     PHYSICAL EXAMINATION: ECOG PERFORMANCE STATUS: 1 - Symptomatic but completely ambulatory  Vitals:   09/20/16 1009  BP: 111/69  Pulse: 70  Resp: 18  Temp: 98.3 F (36.8 C)   Filed Weights    09/20/16 1009  Weight: 166 lb 14.4 oz (75.7 kg)    GENERAL:alert, no distress and comfortable SKIN: skin color, texture, turgor are normal, no rashes or significant lesions EYES: normal, Conjunctiva are pink and non-injected, sclera clear OROPHARYNX:no exudate, no erythema and lips, buccal mucosa, and tongue normal  NECK: supple, thyroid normal size, non-tender, without nodularity LYMPH:  no palpable lymphadenopathy in the cervical, axillary or inguinal LUNGS: clear to auscultation and percussion with normal breathing effort HEART: regular rate & rhythm and no murmurs and no lower extremity edema ABDOMEN:abdomen soft, non-tender and normal bowel sounds MUSCULOSKELETAL:no cyanosis of digits and no clubbing  NEURO: alert & oriented x 3 with fluent speech, no focal motor/sensory deficits EXTREMITIES: No lower extremity edema   LABORATORY DATA:  I have reviewed the data as listed   Chemistry      Component Value Date/Time   NA 141 08/30/2016 0911   K 2.9 (LL) 08/30/2016 0911   CL 111 04/12/2016 0948   CO2 21 (L) 08/30/2016 0911   BUN 6.2 (L) 08/30/2016 0911   CREATININE 0.7 08/30/2016 0911  Component Value Date/Time   CALCIUM 9.5 08/30/2016 0911   ALKPHOS 107 08/30/2016 0911   AST 9 08/30/2016 0911   ALT 9 08/30/2016 0911   BILITOT 0.26 08/30/2016 0911       Lab Results  Component Value Date   WBC 12.8 (H) 09/20/2016   HGB 12.1 09/20/2016   HCT 36.6 09/20/2016   MCV 92.1 09/20/2016   PLT 276 09/20/2016   NEUTROABS 8.1 (H) 09/20/2016    ASSESSMENT & PLAN:  Malignant neoplasm of overlapping sites of right breast in female, estrogen receptor positive ([place]) 07/18/2016: Palpable right breast masses for 1 year; 2 adjacent spiculated masses by ultrasound at 1:00 subareolar 4.2 cm mass; 3 satellite nodules at 9:00; 2 abnormal lymph nodes; biopsy of the 2 masses and the lymph node showed similar-appearing grade 3 IDC ER 100% PR 95% Ki-67 20-30%, HER-2 negative ratio 1.57;  T2 N1 stage IIB (AJCC 8) Because of patient previously received CHOP chemotherapy for lymphoma, she cannot get any more Adriamycin.  Recommendationbased on multidisciplinary tumor board: 1. Neoadjuvant chemotherapy with Taxotereand Cytoxan every 3 weeks4 2. Followed by breast conserving surgery with sentinel lymph node study vs targeted axillary dissection 3. Followed by adjuvant radiation therapy -------------------------------------------------------------------------------- Current treatment: Cycle 3 day 1Taxotere Cytoxan Chemotherapy toxicities:  1. Mucositis Thrush: Treated with fluconazole 211m x 5 days and Magic mouthwash 2. Rash: Likely from reaction to compazine  3. Diarrhea: Imodium and hydration 4. Hypokalemia: oral potassium   Labs reviewed Monitoring closely for toxicities Return to clinic in 3 weeks for cycle 4 Patient will be set up for a breast MRI after the conclusion of 4 cycle of chemotherapy. I sent a message to Dr. CBrantley Stageto see her after the fourth cycle and after the MRI. We will be presenting her in the tumor board after she completes neoadjuvant chemotherapy.  I spent 25 minutes talking to the patient of which more than half was spent in counseling and coordination of care.  No orders of the defined types were placed in this encounter.  The patient has a good understanding of the overall plan. she agrees with it. she will call with any problems that may develop before the next visit here.   GRulon Eisenmenger MD 09/20/16

## 2016-09-20 NOTE — Patient Instructions (Signed)

## 2016-10-08 ENCOUNTER — Telehealth: Payer: Self-pay | Admitting: *Deleted

## 2016-10-08 NOTE — Telephone Encounter (Signed)
Left vm for pt to return call to discuss scheduling of breast MRI. Contact information provided. Received appt for breast MRI on 7/24 at 1pm

## 2016-10-11 ENCOUNTER — Ambulatory Visit (HOSPITAL_BASED_OUTPATIENT_CLINIC_OR_DEPARTMENT_OTHER): Payer: Medicaid Other

## 2016-10-11 ENCOUNTER — Ambulatory Visit: Payer: Medicaid Other

## 2016-10-11 ENCOUNTER — Ambulatory Visit (HOSPITAL_BASED_OUTPATIENT_CLINIC_OR_DEPARTMENT_OTHER): Payer: Medicaid Other | Admitting: Hematology and Oncology

## 2016-10-11 ENCOUNTER — Encounter: Payer: Self-pay | Admitting: Hematology and Oncology

## 2016-10-11 ENCOUNTER — Other Ambulatory Visit (HOSPITAL_BASED_OUTPATIENT_CLINIC_OR_DEPARTMENT_OTHER): Payer: Medicaid Other

## 2016-10-11 DIAGNOSIS — C50811 Malignant neoplasm of overlapping sites of right female breast: Secondary | ICD-10-CM

## 2016-10-11 DIAGNOSIS — Z170 Estrogen receptor positive status [ER+]: Principal | ICD-10-CM

## 2016-10-11 DIAGNOSIS — Z5111 Encounter for antineoplastic chemotherapy: Secondary | ICD-10-CM

## 2016-10-11 DIAGNOSIS — Z5189 Encounter for other specified aftercare: Secondary | ICD-10-CM | POA: Diagnosis not present

## 2016-10-11 DIAGNOSIS — Z95828 Presence of other vascular implants and grafts: Secondary | ICD-10-CM

## 2016-10-11 LAB — CBC WITH DIFFERENTIAL/PLATELET
BASO%: 0.2 % (ref 0.0–2.0)
Basophils Absolute: 0 10*3/uL (ref 0.0–0.1)
EOS%: 0.1 % (ref 0.0–7.0)
Eosinophils Absolute: 0 10*3/uL (ref 0.0–0.5)
HCT: 34.8 % (ref 34.8–46.6)
HGB: 11.5 g/dL — ABNORMAL LOW (ref 11.6–15.9)
LYMPH%: 20.8 % (ref 14.0–49.7)
MCH: 31 pg (ref 25.1–34.0)
MCHC: 33 g/dL (ref 31.5–36.0)
MCV: 93.8 fL (ref 79.5–101.0)
MONO#: 2 10*3/uL — AB (ref 0.1–0.9)
MONO%: 14.5 % — AB (ref 0.0–14.0)
NEUT%: 64.4 % (ref 38.4–76.8)
NEUTROS ABS: 9.1 10*3/uL — AB (ref 1.5–6.5)
PLATELETS: 256 10*3/uL (ref 145–400)
RBC: 3.71 10*6/uL (ref 3.70–5.45)
RDW: 17.7 % — ABNORMAL HIGH (ref 11.2–14.5)
WBC: 14.1 10*3/uL — AB (ref 3.9–10.3)
lymph#: 2.9 10*3/uL (ref 0.9–3.3)

## 2016-10-11 LAB — COMPREHENSIVE METABOLIC PANEL
ALT: 10 U/L (ref 0–55)
AST: 10 U/L (ref 5–34)
Albumin: 3.7 g/dL (ref 3.5–5.0)
Alkaline Phosphatase: 85 U/L (ref 40–150)
Anion Gap: 9 mEq/L (ref 3–11)
BILIRUBIN TOTAL: 0.44 mg/dL (ref 0.20–1.20)
BUN: 6.8 mg/dL — AB (ref 7.0–26.0)
CHLORIDE: 110 meq/L — AB (ref 98–109)
CO2: 23 meq/L (ref 22–29)
CREATININE: 0.7 mg/dL (ref 0.6–1.1)
Calcium: 9.5 mg/dL (ref 8.4–10.4)
EGFR: >90 mL/min/{1.73_m2} (ref >90)
GLUCOSE: 84 mg/dL (ref 70–140)
Potassium: 3.2 mEq/L — ABNORMAL LOW (ref 3.5–5.1)
Sodium: 142 mEq/L (ref 136–145)
TOTAL PROTEIN: 6.3 g/dL — AB (ref 6.4–8.3)

## 2016-10-11 MED ORDER — SODIUM CHLORIDE 0.9% FLUSH
10.0000 mL | INTRAVENOUS | Status: DC | PRN
  Administered 2016-10-11: 10 mL via INTRAVENOUS
  Filled 2016-10-11: qty 10

## 2016-10-11 MED ORDER — PEGFILGRASTIM 6 MG/0.6ML ~~LOC~~ PSKT
6.0000 mg | PREFILLED_SYRINGE | Freq: Once | SUBCUTANEOUS | Status: AC
  Administered 2016-10-11: 6 mg via SUBCUTANEOUS
  Filled 2016-10-11: qty 0.6

## 2016-10-11 MED ORDER — SODIUM CHLORIDE 0.9 % IV SOLN
600.0000 mg/m2 | Freq: Once | INTRAVENOUS | Status: AC
  Administered 2016-10-11: 1100 mg via INTRAVENOUS
  Filled 2016-10-11: qty 55

## 2016-10-11 MED ORDER — DEXAMETHASONE SODIUM PHOSPHATE 10 MG/ML IJ SOLN
INTRAMUSCULAR | Status: AC
  Filled 2016-10-11: qty 1

## 2016-10-11 MED ORDER — SODIUM CHLORIDE 0.9 % IV SOLN
Freq: Once | INTRAVENOUS | Status: AC
  Administered 2016-10-11: 11:00:00 via INTRAVENOUS

## 2016-10-11 MED ORDER — PALONOSETRON HCL INJECTION 0.25 MG/5ML
0.2500 mg | Freq: Once | INTRAVENOUS | Status: AC
  Administered 2016-10-11: 0.25 mg via INTRAVENOUS

## 2016-10-11 MED ORDER — DEXAMETHASONE SODIUM PHOSPHATE 10 MG/ML IJ SOLN
10.0000 mg | Freq: Once | INTRAMUSCULAR | Status: AC
  Administered 2016-10-11: 10 mg via INTRAVENOUS

## 2016-10-11 MED ORDER — HEPARIN SOD (PORK) LOCK FLUSH 100 UNIT/ML IV SOLN
500.0000 [IU] | Freq: Once | INTRAVENOUS | Status: AC | PRN
  Administered 2016-10-11: 500 [IU]
  Filled 2016-10-11: qty 5

## 2016-10-11 MED ORDER — PALONOSETRON HCL INJECTION 0.25 MG/5ML
INTRAVENOUS | Status: AC
  Filled 2016-10-11: qty 5

## 2016-10-11 MED ORDER — DOCETAXEL CHEMO INJECTION 160 MG/16ML
75.0000 mg/m2 | Freq: Once | INTRAVENOUS | Status: AC
  Administered 2016-10-11: 140 mg via INTRAVENOUS
  Filled 2016-10-11: qty 14

## 2016-10-11 MED ORDER — SODIUM CHLORIDE 0.9% FLUSH
10.0000 mL | INTRAVENOUS | Status: DC | PRN
  Administered 2016-10-11: 10 mL
  Filled 2016-10-11: qty 10

## 2016-10-11 NOTE — Progress Notes (Signed)
Patient Care Team: Javier Docker, MD as PCP - General (Internal Medicine) Erroll Luna, MD as Consulting Physician (General Surgery) Nicholas Lose, MD as Consulting Physician (Hematology and Oncology) Kyung Rudd, MD as Consulting Physician (Radiation Oncology) Delice Bison Charlestine Massed, NP as Nurse Practitioner (Hematology and Oncology)  DIAGNOSIS:  Encounter Diagnosis  Name Primary?  . Malignant neoplasm of overlapping sites of right breast in female, estrogen receptor positive ([place])     SUMMARY OF ONCOLOGIC HISTORY:   Malignant neoplasm of overlapping sites of right breast in female, estrogen receptor positive ([place])   07/18/2016 Initial Diagnosis    Palpable right breast masses for 1 year; 2 adjacent spiculated masses by ultrasound at 1:00 subareolar 4.2 cm mass; 3 satellite nodules at 9:00; 2 abnormal lymph nodes; biopsy of the 2 masses and the lymph node showed similar-appearing grade 3 IDC ER 100% PR 95% Ki-67 20-30%, HER-2 negative ratio 1.57; T2 N1 stage IIB (AJCC 8)      08/09/2016 - 10/11/2016 Neo-Adjuvant Chemotherapy    Taxotere and Cytoxan every 3 weeks 4 cycles      08/13/2016 Genetic Testing    Genetic testing is pending      08/22/2016 Genetic Testing    BRCA1 c.1802A>G VUS identified on the common hereditary cancer panel.  The Hereditary Gene Panel offered by [facility] includes sequencing and/or deletion duplication testing of the following 46 genes: APC, ATM, AXIN2, BARD1, BMPR1A, BRCA1, BRCA2, BRIP1, CDH1, CDKN2A (p14ARF), CDKN2A (p16INK4a), CHEK2, CTNNA1, DICER1, EPCAM (Deletion/duplication testing only), GREM1 (promoter region deletion/duplication testing only), KIT, MEN1, MLH1, MSH2, MSH3, MSH6, MUTYH, NBN, NF1, NHTL1, PALB2, PDGFRA, PMS2, POLD1, POLE, PTEN, RAD50, RAD51C, RAD51D, SDHB, SDHC, SDHD, SMAD4, SMARCA4. STK11, TP53, TSC1, TSC2, and VHL.  The following genes were evaluated for sequence changes only: SDHA and HOXB13 c.251G>A variant only.  The report  date is Aug 22, 2016.        CHIEF COMPLIANT: Cycle 4 Taxotere and Cytoxan  INTERVAL HISTORY: [name] is a 46 year old with above-mentioned history of right breast cancer currently on neoadjuvant chemotherapy and today's cycle 4 of Taxotere and Cytoxan. She is excited about completion of chemotherapy. 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 Denies any nausea vomiting. She has gained some weight and is worried about that.  REVIEW OF SYSTEMS:   Constitutional: Denies fevers, chills or abnormal weight loss Eyes: Denies blurriness of vision Ears, nose, mouth, throat, and face: Denies mucositis or sore throat Respiratory: Denies cough, dyspnea or wheezes Cardiovascular: Denies palpitation, chest discomfort Gastrointestinal:  Denies nausea, heartburn or change in bowel habits Skin: Denies abnormal skin rashes Lymphatics: Denies new lymphadenopathy or easy bruising Neurological: Neuropathy in the feet Behavioral/Psych: Mood is stable, no new changes  Extremities: No lower extremity edema Breast:  denies any pain or lumps or nodules in either breasts All other systems were reviewed with the patient and are negative.  I have reviewed the past medical history, past surgical history, social history and family history with the patient and they are unchanged from previous note.  ALLERGIES:  is allergic to compazine [prochlorperazine].  MEDICATIONS:  Current Outpatient Prescriptions  Medication Sig Dispense Refill  . albuterol (PROVENTIL) (2.5 MG/3ML) 0.083% nebulizer solution Take 3 mLs (2.5 mg total) by nebulization every 6 (six) hours as needed for wheezing or shortness of breath. 75 mL 12  . amLODipine (NORVASC) 10 MG tablet Take 10 mg by mouth daily.    Marland Kitchen aspirin 81 MG chewable tablet Chew 81 mg  by mouth every morning.     Marland Kitchen atorvastatin (LIPITOR) 80 MG tablet Take 1 tablet (80 mg total) by mouth daily at 6 PM. 30 tablet 0  . betamethasone valerate ointment  (VALISONE) 0.1 % Apply 1 application topically 2 (two) times daily. 45 g 1  . clopidogrel (PLAVIX) 75 MG tablet Take 1 tablet (75 mg total) by mouth daily with breakfast. 30 tablet 1  . dexamethasone (DECADRON) 4 MG tablet Take 1 tablet (4 mg total) by mouth daily. Take 1 tablet the day before Taxotere. Then 1 tablet again the day after chemo. 8 tablet 0  . fluconazole (DIFLUCAN) 200 MG tablet Take 1 tablet (200 mg total) by mouth daily. 5 tablet 2  . isosorbide mononitrate (IMDUR) 30 MG 24 hr tablet Take 1 tablet (30 mg total) by mouth daily. 30 tablet 0  . lidocaine-prilocaine (EMLA) cream Apply to affected area once 30 g 3  . LORazepam (ATIVAN) 0.5 MG tablet Take 1 tablet (0.5 mg total) by mouth at bedtime. 30 tablet 0  . losartan (COZAAR) 50 MG tablet Take 50 mg by mouth daily.  3  . magic mouthwash SOLN Take 5 mLs by mouth 4 (four) times daily as needed for mouth pain. 240 mL 0  . metoprolol succinate (TOPROL XL) 100 MG 24 hr tablet Take 1 tablet (100 mg total) by mouth daily. Take with or immediately following a meal. 30 tablet 0  . mometasone-formoterol (DULERA) 200-5 MCG/ACT AERO Inhale 2 puffs into the lungs 2 (two) times daily. 1 Inhaler 1  . montelukast (SINGULAIR) 10 MG tablet Take 1 tablet (10 mg total) by mouth at bedtime. 30 tablet 0  . nitroGLYCERIN (NITROSTAT) 0.4 MG SL tablet Place 0.4 mg under the tongue every 5 (five) minutes as needed for chest pain.    Marland Kitchen ondansetron (ZOFRAN) 8 MG tablet Take 1 tablet (8 mg total) by mouth 2 (two) times daily as needed for refractory nausea / vomiting. Start on day 3 after chemo. 30 tablet 1  . potassium chloride SA (K-DUR,KLOR-CON) 20 MEQ tablet Take 1 tablet (20 mEq total) by mouth daily. 30 tablet 0   No current facility-administered medications for this visit.     PHYSICAL EXAMINATION: ECOG PERFORMANCE STATUS: 1 - Symptomatic but completely ambulatory  Vitals:   10/11/16 1030  BP: 133/72  Pulse: 74  Resp: 20  Temp: 98 F (36.7 C)     Filed Weights   10/11/16 1030  Weight: 170 lb 8 oz (77.3 kg)    GENERAL:alert, no distress and comfortable SKIN: skin color, texture, turgor are normal, no rashes or significant lesions EYES: normal, Conjunctiva are pink and non-injected, sclera clear OROPHARYNX:no exudate, no erythema and lips, buccal mucosa, and tongue normal  NECK: supple, thyroid normal size, non-tender, without nodularity LYMPH:  no palpable lymphadenopathy in the cervical, axillary or inguinal LUNGS: clear to auscultation and percussion with normal breathing effort HEART: regular rate & rhythm and no murmurs and no lower extremity edema ABDOMEN:abdomen soft, non-tender and normal bowel sounds MUSCULOSKELETAL:no cyanosis of digits and no clubbing  NEURO: alert & oriented x 3 with fluent speech, distal peripheral sensory neuropathy grade 1 EXTREMITIES: No lower extremity edema BREAST: No palpable masses or nodules in either right or left breasts. No palpable axillary supraclavicular or infraclavicular adenopathy no breast tenderness or nipple discharge. (exam performed in the presence of a chaperone)  LABORATORY DATA:  I have reviewed the data as listed   Chemistry      Component  Value Date/Time   NA 140 09/20/2016 0935   K 3.6 09/20/2016 0935   CL 111 04/12/2016 0948   CO2 24 09/20/2016 0935   BUN 6.5 (L) 09/20/2016 0935   CREATININE 0.7 09/20/2016 0935      Component Value Date/Time   CALCIUM 9.9 09/20/2016 0935   ALKPHOS 96 09/20/2016 0935   AST 11 09/20/2016 0935   ALT 9 09/20/2016 0935   BILITOT 0.34 09/20/2016 0935       Lab Results  Component Value Date   WBC 14.1 (H) 10/11/2016   HGB 11.5 (L) 10/11/2016   HCT 34.8 10/11/2016   MCV 93.8 10/11/2016   PLT 256 10/11/2016   NEUTROABS 9.1 (H) 10/11/2016    ASSESSMENT & PLAN:  Malignant neoplasm of overlapping sites of right breast in female, estrogen receptor positive ([place]) 07/18/2016: Palpable right breast masses for 1 year; 2 adjacent  spiculated masses by ultrasound at 1:00 subareolar 4.2 cm mass; 3 satellite nodules at 9:00; 2 abnormal lymph nodes; biopsy of the 2 masses and the lymph node showed similar-appearing 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  Recommendationbased on multidisciplinary tumor board: 1. Neoadjuvant chemotherapy with Taxotereand Cytoxan every 3 weeks4 2. Followed by breast conserving surgery with sentinel lymph node study vs targeted axillary dissection 3. Followed by adjuvant radiation therapy -------------------------------------------------------------------------------- Current treatment: Cycle 4day 1Taxotere Cytoxan Chemotherapy toxicities:  1. Mucositis Thrush: Treated with fluconazole250m x 5 days and Magic mouthwash 2. Rash: Likely from reaction to compazine  3. Diarrhea: Imodium and hydration 4. Hypokalemia: oral potassium   Labs reviewed Monitoring closely for toxicities Return to clinic after surgery to discuss the results.   I spent 25 minutes talking to the patient of which more than half was spent in counseling and coordination of care.  No orders of the defined types were placed in this encounter.  The patient has a good understanding of the overall plan. she agrees with it. she will call with any problems that may develop before the next visit here.   GRulon Eisenmenger MD 10/11/16

## 2016-10-11 NOTE — Patient Instructions (Signed)
[facility] Discharge Instructions for Patients Receiving Chemotherapy  Today you received the following chemotherapy agents:  Taxotere, Cytoxan  To help prevent nausea and vomiting after your treatment, we encourage you to take your nausea medication as prescribed.   If you develop nausea and vomiting that is not controlled by your nausea medication, call the clinic.   BELOW ARE SYMPTOMS THAT SHOULD BE REPORTED IMMEDIATELY:  *FEVER GREATER THAN 100.5 F  *CHILLS WITH OR WITHOUT FEVER  NAUSEA AND VOMITING THAT IS NOT CONTROLLED WITH YOUR NAUSEA MEDICATION  *UNUSUAL SHORTNESS OF BREATH  *UNUSUAL BRUISING OR BLEEDING  TENDERNESS IN MOUTH AND THROAT WITH OR WITHOUT PRESENCE OF ULCERS  *URINARY PROBLEMS  *BOWEL PROBLEMS  UNUSUAL RASH Items with * indicate a potential emergency and should be followed up as soon as possible.  Feel free to call the clinic you have any questions or concerns. The clinic phone number is [phone].  Please show the CHEMO ALERT CARD at check-in to the Emergency Department and triage nurse.   

## 2016-10-11 NOTE — Assessment & Plan Note (Signed)
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  Recommendationbased on multidisciplinary tumor board: 1. Neoadjuvant chemotherapy with Taxotereand Cytoxan every 3 weeks4 2. Followed by breast conserving surgery with sentinel lymph node study vs targeted axillary dissection 3. Followed by adjuvant radiation therapy -------------------------------------------------------------------------------- Current treatment: Cycle 4day 1Taxotere Cytoxan Chemotherapy toxicities:  1. Mucositis Thrush: Treated with fluconazole29m x 5 days and Magic mouthwash 2. Rash: Likely from reaction to compazine  3. Diarrhea: Imodium and hydration 4. Hypokalemia: oral potassium   Labs reviewed Monitoring closely for toxicities Return to clinic after breast MRI to discuss the results.

## 2016-10-11 NOTE — Patient Instructions (Signed)

## 2016-10-15 ENCOUNTER — Ambulatory Visit (HOSPITAL_COMMUNITY)
Admission: RE | Admit: 2016-10-15 | Discharge: 2016-10-15 | Disposition: A | Discharge status: Home / Self Care | Payer: Medicaid Other | Source: Ambulatory Visit | Attending: Hematology and Oncology | Admitting: Hematology and Oncology

## 2016-10-15 ENCOUNTER — Telehealth: Payer: Self-pay | Admitting: *Deleted

## 2016-10-15 DIAGNOSIS — Z170 Estrogen receptor positive status [ER+]: Secondary | ICD-10-CM | POA: Insufficient documentation

## 2016-10-15 DIAGNOSIS — C50811 Malignant neoplasm of overlapping sites of right female breast: Secondary | ICD-10-CM | POA: Insufficient documentation

## 2016-10-15 MED ORDER — GADOBENATE DIMEGLUMINE 529 MG/ML IV SOLN
20.0000 mL | Freq: Once | INTRAVENOUS | Status: AC | PRN
  Administered 2016-10-15: 16 mL via INTRAVENOUS

## 2016-10-15 NOTE — Telephone Encounter (Signed)
Left message to congratulate patient on her final chemo.

## 2016-10-25 ENCOUNTER — Ambulatory Visit: Payer: Self-pay | Admitting: Surgery

## 2016-10-25 DIAGNOSIS — C50911 Malignant neoplasm of unspecified site of right female breast: Secondary | ICD-10-CM

## 2016-10-25 DIAGNOSIS — C779 Secondary and unspecified malignant neoplasm of lymph node, unspecified: Principal | ICD-10-CM

## 2016-10-25 NOTE — H&P (Signed)
Davene Jobin 10/25/2016 9:37 AM Location: [facility] Patient #: [id number] DOB: May 18, 1970 Single / Language: Cleophus Molt / Race: Black or African American Female  History of Present Illness Marcello Moores A. Suyash Amory MD; 10/25/2016 10:05 AM) Patient words: [name] is a 46 year old with above-mentioned history of right breast cancer currently on neoadjuvant chemotherapy and today's cycle 4 of Taxotere and Cytoxan.  She has completed chemotherapy but has significant property. MRI was repeated which shows minimal change to her right breast cancer. She is here today to discuss surgical options. She is in good spirits and feels well.     Malignant neoplasm of overlapping sites of right breast in female, estrogen receptor positive ([place]) 07/18/2016: Palpable right breast masses for 1 year; 2 adjacent spiculated masses by ultrasound at 1:00 subareolar 4.2 cm mass; 3 satellite nodules at 9:00; 2 abnormal lymph nodes; biopsy of the 2 masses and the lymph node showed similar-appearing 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 She presents today for evaluation of following neoadjuvant chemotherapy.  LABS: None.  EXAM: BILATERAL BREAST MRI WITH AND WITHOUT CONTRAST  TECHNIQUE: Multiplanar, multisequence MR images of both breasts were obtained prior to and following the intravenous administration of 16 ml of MultiHance.  THREE-DIMENSIONAL MR IMAGE RENDERING ON INDEPENDENT WORKSTATION:  Three-dimensional MR images were rendered by post-processing of the original MR data on an independent workstation. The three-dimensional MR images were  interpreted, and findings are reported in the following complete MRI report for this study. Three dimensional images were evaluated at the independent DynaCad workstation  COMPARISON: Previous exam(s).  FINDINGS: Breast composition: b. Scattered fibroglandular tissue.  Background parenchymal enhancement: Minimal.  Right breast: Again noted is the spiculated mass in the retroareolar right breast, which does not appear significantly changed in size in the axial plane. In greatest dimension medial to lateral the mass measures 4.4 cm, previously 4.6 cm remeasured in similar plane. The greatest dimension in the transverse plane is 4.0 cm, stable from prior. The does appear to be mild decrease in bulk in the craniocaudal dimension, though due to the irregular shape of the mass, it is difficult to directly compare with the prior exam. As an example, immediately behind the nipple on the sagittal image, the mass measures 1.4 cm, previously measuring 1.8 cm in the same location. The several surrounding satellite lesions appear stable from prior.  Left breast: No mass or abnormal enhancement. The Port-A-Cath is identified in the left breast.  Lymph nodes: There may be slight decrease in size of the previously biopsied metastatic right axillary lymph node. Again noted is a prominent left axillary lymph node.  Ancillary findings: None.  IMPRESSION: 1. Overall, there does not appear to be a large interval change in the size or extent of the known cancer in the right breast. There does appear to be mild decrease in bulk when evaluated in the sagittal plane. See discussion above.  2. No evidence of left breast malignancy.  RECOMMENDATION: Continued treatment plan.  BI-RADS CATEGORY 6: Known biopsy-proven malignancy.   Electronically Signed By: Ammie Ferrier M.D. On: 10/15/2016 16:48.  The patient is a 46 year old female.  Allergies Malachy Moan, RMA; 10/25/2016  9:38 AM) Compazine *ANTIPSYCHOTICS/ANTIMANIC AGENTS* Swelling.  Medication History Malachy Moan, [place]; 10/25/2016 9:41 AM) Albuterol Sulfate ((2.5 MG/3ML)0.083% Nebulized Soln, Inhalation) Active. LORazepam (0.5MG Tablet, Oral) Active. Fluconazole (200MG Tablet, Oral) Active. Norvasc (10MG Tablet, Oral) Active. Aspirin (81MG Tablet Chewable, Oral) Active. Nitroglycerin (0.4MG Tab Sublingual, Sublingual) Active. Mometasone Furoate (110MCG/INH Aero Pow Br Act, Inhalation) Active. Lipitor (80MG Tablet, Oral) Active. Plavix (75MG Tablet, Oral) Active. Metoprolol Succinate ER (100MG Tablet ER 24HR, Oral) Active. Losartan Potassium (50MG Tablet, Oral) Active. Medications Reconciled    Vitals Malachy Moan RMA; 10/25/2016 9:42 AM) 10/25/2016 9:41 AM Weight: 171.4 lb Height: 63in Body Surface Area: 1.81 m Body Mass Index: 30.36 kg/m  Temp.: 97.63F  Pulse: 88 (Regular)  BP: 130/80 (Sitting, Left Arm, Standard)      Physical Exam (Charmian Forbis A. Natallia Stellmach MD; 10/25/2016 10:06 AM)  General Mental Status-Alert. General Appearance-Consistent with stated age. Hydration-Well hydrated. Voice-Normal.  Breast Note: Right breast mass located below the nipple areolar complex is intact. Minimal change in size. Left breast is normal. Right breast mass was not fixed but there is retraction of the right nipple. Right axilla is normal. Left axilla is normal.  Cardiovascular Cardiovascular examination reveals -on palpation PMI is normal in location and amplitude, no palpable S3 or S4. Normal cardiac borders., normal heart sounds, regular rate and rhythm with no murmurs, carotid auscultation reveals no bruits and normal pedal pulses bilaterally.  Neurologic Neurologic evaluation reveals -alert and oriented x 3 with no impairment of recent or remote memory. Mental Status-Normal.  Musculoskeletal Normal Exam - Left-Upper Extremity Strength Normal and Lower  Extremity Strength Normal. Normal Exam - Right-Upper Extremity Strength Normal, Lower Extremity Weakness.    Assessment & Plan (Analis Distler A. Vian Fluegel MD; 10/25/2016 10:05 AM)  BREAST CANCER, RIGHT (C50.911) Impression: Patient with minimal response to neoadjuvant chemotherapy. I discussed lumpectomy and mastectomy with her today as well as reconstruction. She is not a good candidate for breast conservation therefore recommended right simple mastectomy targeted right axillary lymph node biopsy and right axillary sentinel lymph node mapping. Refer to plastics for reconstruction options. Discussed treatment options for breast cancer to include breast conservation vs mastectomy with reconstruction. Pt has decided on mastectomy. Risk include bleeding, infection, flap necrosis, pain, numbness, recurrence, hematoma, other surgery needs. Pt understands and agrees to proceed. Risk of sentinel lymph node mapping include bleeding, infection, lymphedema, shoulder pain. stiffness, dye allergy. cosmetic deformity , blood clots, death, need for more surgery. Pt agres to proceed.  Current Plans You are being scheduled for surgery- Our schedulers will call you.  You should hear from our office's scheduling department within 5 working days about the location, date, and time of surgery. We try to make accommodations for patient's preferences in scheduling surgery, but sometimes the OR schedule or the surgeon's schedule prevents [place] from making those accommodations.  If you have not heard from our office [phone] in 5 working days, call the office and ask for your surgeon's nurse.  If you have other questions about your diagnosis, plan, or surgery, call the office and ask for your surgeon's nurse.  Pt Education - CCS Mastectomy HCI Pt Education - flb breast cancer surgery: discussed with patient and provided information. We discussed the staging and pathophysiology of breast cancer. We discussed all of the  different options for treatment for breast cancer including surgery, chemotherapy, radiation therapy, Herceptin, and antiestrogen therapy. We discussed a sentinel lymph node biopsy as she does not appear to having lymph  node involvement right now. We discussed the performance of that with injection of radioactive tracer and blue dye. We discussed that she would have an incision underneath her axillary hairline. We discussed that there is a bout a 10-20% chance of having a positive node with a sentinel lymph node biopsy and we will await the permanent pathology to make any other first further decisions in terms of her treatment. One of these options might be to return to the operating room to perform an axillary lymph node dissection. We discussed about a 1-2% risk lifetime of chronic shoulder pain as well as lymphedema associated with a sentinel lymph node biopsy. We discussed the options for treatment of the breast cancer which included lumpectomy versus a mastectomy. We discussed the performance of the lumpectomy with a wire placement. We discussed a 10-20% chance of a positive margin requiring reexcision in the operating room. We also discussed that she may need radiation therapy or antiestrogen therapy or both if she undergoes lumpectomy. We discussed the mastectomy and the postoperative care for that as well. We discussed that there is no difference in her survival whether she undergoes lumpectomy with radiation therapy or antiestrogen therapy versus a mastectomy. There is a slight difference in the local recurrence rate being 3-5% with lumpectomy and about 1% with a mastectomy. We discussed the risks of operation including bleeding, infection, possible reoperation. She understands her further therapy will be based on what her stages at the time of her operation.  Pt Education - ABC (After Breast Cancer) Class Info: discussed with patient and provided information.

## 2016-12-12 ENCOUNTER — Other Ambulatory Visit: Payer: Self-pay | Admitting: Surgery

## 2016-12-12 DIAGNOSIS — C779 Secondary and unspecified malignant neoplasm of lymph node, unspecified: Principal | ICD-10-CM

## 2016-12-12 DIAGNOSIS — C50911 Malignant neoplasm of unspecified site of right female breast: Secondary | ICD-10-CM

## 2016-12-16 ENCOUNTER — Telehealth: Payer: Self-pay | Admitting: Hematology and Oncology

## 2016-12-16 NOTE — Telephone Encounter (Signed)
Spoke with patient and scheduled appts per 9/21 sch msg.

## 2016-12-17 ENCOUNTER — Encounter: Payer: Self-pay | Admitting: *Deleted

## 2016-12-17 DIAGNOSIS — C50811 Malignant neoplasm of overlapping sites of right female breast: Secondary | ICD-10-CM

## 2016-12-18 ENCOUNTER — Encounter: Payer: Self-pay | Admitting: Radiation Oncology

## 2016-12-18 NOTE — Pre-Procedure Instructions (Signed)
[name]  12/18/2016      [id number] [name], [place] [address] Phone: [phone] Fax: [phone]    Your procedure is scheduled on Wednesday, 12/25/2016.  Report to [facility] Admitting at 10:30 A.M.  Call this number if you have problems the morning of surgery:  [phone]   Remember:  Do not eat food or drink liquids after midnight.  Take these medicines the morning of surgery with A SIP OF WATER: Acetaminophen (Tylenol) - if needed Albuterol Nebulizer - if needed Amlodipine (norvasc) Isosorbide mononitrate (Imdur) Metoprolol succinate (Toprol-XL) Mometasone-formoterol (Dulera) inhaler Proair HFA inhaler - if needed (Bring with you the day of surgery)  7 days prior to surgery STOP taking any Aspirin, Aleve, Naproxen, Ibuprofen, Motrin, Advil, Goody's, BC's, all herbal medications, fish oil, and all vitamins    Do not wear jewelry, make-up or nail polish.  Do not wear lotions, powders, or perfumes, or deoderant.  Do not shave 48 hours prior to surgery.    Do not bring valuables to the hospital.  [facility] is not responsible for any belongings or valuables.  Contacts, eyeglasses, dentures or bridgework may not be worn into surgery.  Leave your suitcase in the car.  After surgery it may be brought to your room.  For patients admitted to the hospital, discharge time will be determined by your treatment team.  Patients discharged the day of surgery will not be allowed to drive home.   Name and phone number of your driver:    Special instructions:   [name]- Preparing For Surgery  Before surgery, you can play an important role. Because skin is not sterile, your skin needs to be as free of germs as possible. You can reduce the number of germs on your skin by washing with CHG (chlorahexidine gluconate) Soap before surgery.  CHG is an antiseptic cleaner which kills germs and bonds  with the skin to continue killing germs even after washing.  Please do not use if you have an allergy to CHG or antibacterial soaps. If your skin becomes reddened/irritated stop using the CHG.  Do not shave (including legs and underarms) for at least 48 hours prior to first CHG shower. It is OK to shave your face.  Please follow these instructions carefully.   1. Shower the NIGHT BEFORE SURGERY and the MORNING OF SURGERY with CHG.   2. If you chose to wash your hair, wash your hair first as usual with your normal shampoo.  3. After you shampoo, rinse your hair and body thoroughly to remove the shampoo.  4. Use CHG as you would any other liquid soap. You can apply CHG directly to the skin and wash gently with a scrungie or a clean washcloth.   5. Apply the CHG Soap to your body ONLY FROM THE NECK DOWN.  Do not use on open wounds or open sores. Avoid contact with your eyes, ears, mouth and genitals (private parts). Wash genitals (private parts) with your normal soap.  6. Wash thoroughly, paying special attention to the area where your surgery will be performed.  7. Thoroughly rinse your body with warm water from the neck down.  8. DO NOT shower/wash with your normal soap after using and rinsing off the CHG Soap.  9. Pat yourself dry with a CLEAN TOWEL.   10. Wear CLEAN PAJAMAS   11. Place CLEAN SHEETS on your bed the night of  your first shower and DO NOT SLEEP WITH PETS.    Day of Surgery: Shower as stated above. Do not apply any deodorants/lotions.  Please wear clean clothes to the hospital/surgery center.      Please read over the following fact sheets that you were given. Pain Booklet, Coughing and Deep Breathing and Surgical Site Infection Prevention

## 2016-12-19 ENCOUNTER — Encounter (HOSPITAL_COMMUNITY)
Admission: RE | Admit: 2016-12-19 | Discharge: 2016-12-19 | Disposition: A | Discharge status: Home / Self Care | Payer: Medicaid Other | Source: Ambulatory Visit | Attending: Surgery | Admitting: Surgery

## 2016-12-19 ENCOUNTER — Encounter (HOSPITAL_COMMUNITY): Payer: Self-pay | Admitting: Vascular Surgery

## 2016-12-19 ENCOUNTER — Encounter (HOSPITAL_COMMUNITY): Payer: Self-pay

## 2016-12-19 DIAGNOSIS — Z01812 Encounter for preprocedural laboratory examination: Secondary | ICD-10-CM | POA: Diagnosis present

## 2016-12-19 DIAGNOSIS — C50911 Malignant neoplasm of unspecified site of right female breast: Secondary | ICD-10-CM

## 2016-12-19 DIAGNOSIS — C779 Secondary and unspecified malignant neoplasm of lymph node, unspecified: Secondary | ICD-10-CM | POA: Diagnosis not present

## 2016-12-19 HISTORY — DX: Atherosclerotic heart disease of native coronary artery without angina pectoris: I25.10

## 2016-12-19 LAB — CBC WITH DIFFERENTIAL/PLATELET
BASOS ABS: 0 10*3/uL (ref 0.0–0.1)
BASOS PCT: 0 %
Eosinophils Absolute: 0.2 10*3/uL (ref 0.0–0.7)
Eosinophils Relative: 2 %
HEMATOCRIT: 43.7 % (ref 36.0–46.0)
HEMOGLOBIN: 14.4 g/dL (ref 12.0–15.0)
Lymphocytes Relative: 38 %
Lymphs Abs: 3.5 10*3/uL (ref 0.7–4.0)
MCH: 31.2 pg (ref 26.0–34.0)
MCHC: 33 g/dL (ref 30.0–36.0)
MCV: 94.6 fL (ref 78.0–100.0)
Monocytes Absolute: 0.5 10*3/uL (ref 0.1–1.0)
Monocytes Relative: 5 %
NEUTROS ABS: 5.1 10*3/uL (ref 1.7–7.7)
Neutrophils Relative %: 55 %
Platelets: 256 10*3/uL (ref 150–400)
RBC: 4.62 MIL/uL (ref 3.87–5.11)
RDW: 12.6 % (ref 11.5–15.5)
WBC: 9.3 10*3/uL (ref 4.0–10.5)

## 2016-12-19 LAB — COMPREHENSIVE METABOLIC PANEL
ALBUMIN: 4.2 g/dL (ref 3.5–5.0)
ALK PHOS: 99 U/L (ref 38–126)
ALT: 13 U/L — AB (ref 14–54)
AST: 18 U/L (ref 15–41)
Anion gap: 10 (ref 5–15)
BILIRUBIN TOTAL: 0.3 mg/dL (ref 0.3–1.2)
BUN: 7 mg/dL (ref 6–20)
CALCIUM: 9.7 mg/dL (ref 8.9–10.3)
CO2: 21 mmol/L — ABNORMAL LOW (ref 22–32)
Chloride: 107 mmol/L (ref 101–111)
Creatinine, Ser: 0.73 mg/dL (ref 0.44–1.00)
GFR calc Af Amer: >60 mL/min (ref >60)
GFR calc non Af Amer: >60 mL/min (ref >60)
GLUCOSE: 88 mg/dL (ref 65–99)
POTASSIUM: 4 mmol/L (ref 3.5–5.1)
Sodium: 138 mmol/L (ref 135–145)
TOTAL PROTEIN: 7.2 g/dL (ref 6.5–8.1)

## 2016-12-19 NOTE — Progress Notes (Addendum)
Hx of lymphoma 2000 Did have sleep study done in [place], negative Had Renal artery stenting by Dr. Christen Butter in 2016.  She has not seen Dr. Einar Gip since 2016, and gets her refill of plavix from her PCP Dr. Carolan Clines. [phone] She relayed that the NP at primary care had called Dr. Irven Shelling office and he said she could be off plavix 3 days prior to, but NP said she could stop taking it as of 9/6. On or about 2016-17, she came to ER with c/o cp--was told 'minor heart attack'.  Heart cath was done 08/2015 by Dr. Ellyn Hack.  Patient states 'fine' Last dose of chemo 07/2016. Did call to get instructions regarding ASA from her PCP. (1:57pm - she will stop her aspirin 3 days ahead ld on 9/30, and she is aware)

## 2016-12-19 NOTE — Pre-Procedure Instructions (Signed)
[name]  12/19/2016      [name], [name] [address] Phone: [phone] Fax: [phone]    Your procedure is scheduled on Wednesday, 12/25/2016.   Report to [name] Admitting at 10:30 A.M.             (posted surgery time 12:30p-3:30p)   Call this number if you have problems the MORNING of surgery:  [phone]   Remember:   Do not eat food or drink liquids after midnight Tuesday.   Take these medicines the morning of surgery with A SIP OF WATER: Acetaminophen (Tylenol) - if needed Albuterol Nebulizer - if needed Amlodipine (norvasc) Isosorbide mononitrate (Imdur) Metoprolol succinate (Toprol-XL) Mometasone-formoterol (Dulera) inhaler Proair HFA inhaler - if needed (Bring with you the day of surgery)  7 days prior to surgery STOP taking any Aspirin, Aleve, Naproxen, Ibuprofen, Motrin, Advil, Goody's, BC's, all herbal medications, fish oil, and all vitamins.                           Plavix will be stopped ____________________________   Do not wear jewelry, make-up or nail polish.  Do not wear lotions, powders,  perfumes, or deoderant.  Do not shave 48 hours prior to surgery.     Do not bring valuables to the [facility].  [name] is not responsible for any belongings or valuables.  Contacts, eyeglasses, dentures or bridgework may not be worn into surgery.  Leave your suitcase in the car.  After surgery it may be brought to your room.  For patients admitted to the [facility], discharge time will be determined by your treatment team.   [name]- Preparing For Surgery  Before surgery, you can play an important role. Because skin is not sterile, your skin needs to be as free of germs as possible. You can reduce the number of germs on your skin by washing with CHG (chlorahexidine gluconate) Soap before surgery.  CHG is an antiseptic cleaner which kills germs and  bonds with the skin to continue killing germs even after washing.  Please do not use if you have an allergy to CHG or antibacterial soaps. If your skin becomes reddened/irritated stop using the CHG.  Do not shave (including legs and underarms) for at least 48 hours prior to first CHG shower. It is OK to shave your face.  Please follow these instructions carefully.   1. Shower the NIGHT BEFORE SURGERY and the MORNING OF SURGERY with CHG.   2. If you chose to wash your hair, wash your hair first as usual with your normal shampoo.  3. After you shampoo, rinse your hair and body thoroughly to remove the shampoo.  4. Use CHG as you would any other liquid soap. You can apply CHG directly to the skin and wash gently with a scrungie or a clean washcloth.   5. Apply the CHG Soap to your body ONLY FROM THE NECK DOWN.  Do not use on open wounds or open sores. Avoid contact with your eyes, ears, mouth and genitals (private parts). Wash genitals (private parts) with your normal soap.  6. Wash thoroughly, paying special attention to the area where your surgery will be performed.  7. Thoroughly rinse your body with warm water from the neck down.  8. DO NOT shower/wash with your normal soap after using and rinsing off the CHG Soap.  9. [place]  yourself dry with a CLEAN TOWEL.   10. Wear CLEAN PAJAMAS   11. Place CLEAN SHEETS on your bed the night of your first shower and DO NOT SLEEP WITH PETS.    Day of Surgery: Shower as stated above. Do not apply any deodorants/lotions.  Please wear clean clothes to the [facility]/surgery center.      Please read over the following fact sheets that you were given. Pain Booklet, Coughing and Deep Breathing and Surgical Site Infection Prevention

## 2016-12-20 ENCOUNTER — Encounter (HOSPITAL_COMMUNITY): Payer: Self-pay

## 2016-12-20 ENCOUNTER — Ambulatory Visit: Payer: Self-pay | Admitting: Plastic Surgery

## 2016-12-20 ENCOUNTER — Telehealth: Payer: Self-pay | Admitting: Cardiovascular Disease

## 2016-12-20 DIAGNOSIS — C50911 Malignant neoplasm of unspecified site of right female breast: Secondary | ICD-10-CM

## 2016-12-20 NOTE — Telephone Encounter (Signed)
° °  [facility] Pre-operative Risk Assessment    Request for surgical clearance:  What type of surgery is being performed? Breast cancer surgery 1. When is this surgery scheduled? 12-25-2016  2. Are there any medications that need to be held prior to surgery and how long?plavix, need clearance that states her heart is okay to have surgery  3. Name of physician performing surgery? Dr.Cornett  What is your office phone and fax number? [phone]  Fax [phone] Anesthesia type (None, local, MAC, general) ? general  Lenon Curt 12/20/2016, 2:04 PM  _________________________________________________________________   (provider comments below)

## 2016-12-20 NOTE — Progress Notes (Signed)
Anesthesia Chart Review:  Pt is a 46 year old female scheduled for R simple mastectomy with radioactive seed targeted lymph node excision and right axillary sentinel lymph node biopsy, immediate right breast reconstruction with placement of tissue expander and Flex HD on 12/25/2016 with Erroll Luna, M.D. and Audelia Hives, D.O.  - PCP is Lillia Corporal, MD - Cardiologist is Shelva Majestic, MD, last office visit 09/28/15 with Kerin Ransom, [place]; f/u in 3 months recommended but did not happen. (Formerly she was a pt of Dr. Irven Shelling)   PMH includes: CAD (NSTEMI 08/2015), renal artery stenosis (s/p stent 08/23/14), HTN OSA, asthma, lymphoma, asthma, GERD. Current smoker. BMI 30.   Medications include: Albuterol, amlodipine, ASA 81 mg, Lipitor, Plavix, Imdur, losartan, metoprolol, Dulera, potassium. Last dose plavix 11/28/16.   BP 128/74   Pulse 66   Temp 36.8 C   Resp 20   Ht 5\' 3"  (1.6 m)   Wt 169 lb 6.4 oz (76.8 kg)   LMP  (Approximate) Comment: hasn't had a period in 7-8 yrs  SpO2 96%   BMI 30.01 kg/m   Preoperative labs reviewed.    CXR 04/12/16: No acute finding. Chronic bronchitic markings.  EKG 04/12/16: NSR. Cannot rule out inferior infarct, age undetermined.  Cardiac cath 09/18/15: 1. Mid LAD to Dist LAD lesion, 65% stenosed. This is in a tortuous segment with several hinge points. Focal areas may be more significant, however the remainder the area looks relatively okay. 2. Mid LAD lesion, 45% stenosed. 3. Ost 1st Diag to 1st Diag lesion, 50% stenosed. 4. Known mild to moderately reduced EF by Echo today.  Echo 09/18/15:  - Left ventricle: The cavity size was normal. There was moderate concentric hypertrophy. Systolic function was normal. The estimated ejection fraction was 55%. Wall motion was normal; there were no regional wall motion abnormalities. The study is not technically sufficient to allow evaluation of LV diastolic function. - Aortic valve: Trileaflet; normal thickness  leaflets. Transvalvular velocity was within the normal range. There was no stenosis. There was no regurgitation. - Aortic root: The aortic root was normal in size. - Mitral valve: There was moderate regurgitation directed centrally. Valve area by pressure half-time: 1.8 cm^2. - Right ventricle: Systolic function was normal. - Tricuspid valve: There was mild regurgitation. - Pulmonary arteries: Systolic pressure was mildly increased. PA peak pressure: 37 mm Hg (S). - Inferior vena cava: The vessel was normal in size. - Pericardium, extracardiac: There was no pericardial effusion.  Pt is out of date with cardiology f/u.  EKG 04/12/16 with new Q waves. Pt stopped plavix on her own 11/28/16 (PCP's note states to hold it 3 days before surgery only).   Reviewed case with Dr. Conrad [place].  Pt will need cardiology evaluation prior to surgery. I notified Chemira in Dr. Josetta Huddle office.   Willeen Cass, FNP-BC [facility] Stay [facility]/Anesthesiology Phone: [phone] 12/20/2016 1:17 PM

## 2016-12-20 NOTE — H&P (Signed)
[name] is an 46 y.o. female.   Chief Complaint: right breast cancer HPI: The patient is a 46 y.o. yrs old bf here with her daughter for consultation for breast reconstruction.  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  She had lymphoma many years ago.  She has a port and has been receiving chemotherapy.  She is 5 feet 3 inches tall, weight is 169 pounds. Preop Bra = 36 C.  She had a kidney stent placed at [facility] in 2015. The mass is palpable under the NAC.  She has grade I-II breast ptosis.   Past Medical History:  Diagnosis Date  . Asthma   . Bell's palsy   . Cardiomegaly   . Coronary artery disease    NSTEMI 08/2015 (65% LAD; D1 50%)  . Family history of breast cancer   . Family history of prostate cancer   . GERD (gastroesophageal reflux disease)   . Hypertension   . Lymphoma of lymph nodes of neck ([place])   . OSA (obstructive sleep apnea)    negative results  . Renal artery stenosis ([facility])    s/p stent placment to left     Past Surgical History:  Procedure Laterality Date  . CARDIAC CATHETERIZATION N/A 09/18/2015   Procedure: Left Heart Cath and Coronary Angiography;  Surgeon: Leonie Man, MD;  Location: [facility];  Service: Cardiovascular;  Laterality: N/A;  . [place]   x2  . IR CV LINE INJECTION  07/24/2016  . PERIPHERAL VASCULAR CATHETERIZATION N/A 08/23/2014   Procedure: Renal Angiography;  Surgeon: Adrian Prows, MD;  Location: [facility];  Service: Cardiovascular;  Laterality: N/A;  . RENAL ARTERY STENT Left 08/23/2014  . SKIN BIOPSY  2000  . TUBAL LIGATION      Family History  Problem Relation Age of Onset  . Hypertension Mother   . Cervical cancer Mother        hysterectomy with ?metastatic disease  . Coronary artery disease Father 23       CABG  . Asthma Father   . Breast  cancer Maternal Aunt        age at diagnosis unknown  . Prostate cancer Cousin        paternal first cousin  . Migraines Neg Hx    Social History:  reports that she has been smoking Cigarettes.  She has a 7.50 pack-year smoking history. She has never used smokeless tobacco. She reports that she does not drink alcohol or use drugs.  Allergies:  Allergies  Allergen Reactions  . Compazine [Prochlorperazine] Swelling    Throat swelling     (Not in a hospital admission)  Results for orders placed or performed during the hospital encounter of 12/19/16 (from the past 48 hour(s))  CBC WITH DIFFERENTIAL     Status: None   Collection Time: 12/19/16  9:48 AM  Result Value Ref Range   WBC 9.3 4.0 - 10.5 K/uL   RBC 4.62 3.87 - 5.11 MIL/uL   Hemoglobin 14.4 12.0 - 15.0 g/dL   HCT 43.7 36.0 - 46.0 %   MCV 94.6 78.0 - 100.0 fL   MCH 31.2 26.0 - 34.0 pg   MCHC 33.0 30.0 - 36.0 g/dL   RDW 12.6 11.5 - 15.5 %   Platelets 256 150 - 400 K/uL  Neutrophils Relative % 55 %   Neutro Abs 5.1 1.7 - 7.7 K/uL   Lymphocytes Relative 38 %   Lymphs Abs 3.5 0.7 - 4.0 K/uL   Monocytes Relative 5 %   Monocytes Absolute 0.5 0.1 - 1.0 K/uL   Eosinophils Relative 2 %   Eosinophils Absolute 0.2 0.0 - 0.7 K/uL   Basophils Relative 0 %   Basophils Absolute 0.0 0.0 - 0.1 K/uL  Comprehensive metabolic panel     Status: Abnormal   Collection Time: 12/19/16  9:48 AM  Result Value Ref Range   Sodium 138 135 - 145 mmol/L   Potassium 4.0 3.5 - 5.1 mmol/L   Chloride 107 101 - 111 mmol/L   CO2 21 (L) 22 - 32 mmol/L   Glucose, Bld 88 65 - 99 mg/dL   BUN 7 6 - 20 mg/dL   Creatinine, Ser 0.73 0.44 - 1.00 mg/dL   Calcium 9.7 8.9 - 10.3 mg/dL   Total Protein 7.2 6.5 - 8.1 g/dL   Albumin 4.2 3.5 - 5.0 g/dL   AST 18 15 - 41 U/L   ALT 13 (L) 14 - 54 U/L   Alkaline Phosphatase 99 38 - 126 U/L   Total Bilirubin 0.3 0.3 - 1.2 mg/dL   GFR calc non Af Amer >60 >60 mL/min   GFR calc Af Amer >60 >60 mL/min    Comment:  (NOTE) The eGFR has been calculated using the CKD EPI equation. This calculation has not been validated in all clinical situations. eGFR's persistently <60 mL/min signify possible Chronic Kidney Disease.    Anion gap 10 5 - 15   No results found.  Review of Systems  Constitutional: Negative.   HENT: Negative.   Eyes: Negative.   Respiratory: Negative.   Cardiovascular: Negative.   Gastrointestinal: Negative.   Genitourinary: Negative.   Musculoskeletal: Negative.   Skin: Negative.   Neurological: Negative.   Psychiatric/Behavioral: Negative.     There were no vitals taken for this visit. Physical Exam  Constitutional: She is oriented to person, place, and time. She appears well-developed and well-nourished.  HENT:  Head: Normocephalic and atraumatic.  Eyes: Pupils are equal, round, and reactive to light. EOM are normal.  Cardiovascular: Normal rate.   Respiratory: Effort normal. No respiratory distress.  GI: Soft. She exhibits no distension. There is no tenderness.  Neurological: She is alert and oriented to person, place, and time.  Skin: Skin is warm. No rash noted. No erythema.  Psychiatric: She has a normal mood and affect. Her behavior is normal. Judgment and thought content normal.     Assessment/Plan  She is interested in implant based reconstruction with a latissimus if needed.  There is a high chance she will be offered radiation.  We will plan for immediate right breast reconstruction with expander and Flex HD placement.  We will try to exchange prior to radiation.  The Latissimus muscle will be available if needed:  Limitations with expansion due to radiation or the need for radiation, tissue breakdown or size limitations.  I spoke with Dr. Pricilla Handler, DO 12/20/2016, 12:17 PM

## 2016-12-23 NOTE — Progress Notes (Addendum)
Cardiology Office Note:    Date:  12/24/2016   ID:  [name], DOB 1970-08-26, MRN [id number]  PCP:  Javier Docker, MD  Cardiologist:  Dr. Claiborne Billings  Referring MD: Javier Docker, MD   Chief Complaint  Patient presents with  . Pre-op Exam    History of Present Illness:    [name] is a 46 y.o. female with a past medical history significant for Renovascular hypertension, right-sided Bell's palsy in 2001 with residual right-sided facial weakness, lymphoma of the neck, Left renal artery stenosis with stenting in 07/2014, asthma, GERD, smoker. She had NSTEMI in 08/2015 with left heart cath showing disease in the LAD with 65% stenosis, however this is a tortuous segment and there appears to be some spiraling of the vessel at this point. This would probably require relatively significant stent placement in the mid LAD. Her troponin was only mildly elevated at 1.57 and it was thought that this was related to hypertensive urgency. She is treated with aspirin 81 mg, atorvastatin 80 mg, Plavix 75 mg, isosorbide 30 mg, losartan 50 mg, metoprolol succinate 100 mg.  She is here today for preoperative evaluation with plans for breast cancer surgery that is scheduled for 12/25/2016 she would need to be off Plavix for this surgery.   She was last seen in the office on 09/28/2015 by Erlene Quan PA. This was also her first visit at our office. She had previously been seen in the past by Dr. Einar Gip for difficult to control blood pressure. She was to follow-up in our office 3 months after her initial visit but did not. She has not had an appointment since 09/2015.  The patient is here today with her daughter for her preoperative cardiac evaluation. The patient is reporting that she continues to have left-sided chest pressure that radiates to the left arm and occurs with exertion like washing dishes, vacuuming and walking. This is not associated with shortness of breath, palpitations, nausea,  lightheadedness. It is similar to the discomfort she had with her MI in 2017 but is much less intense. It usually lasts for up to an hour or so and this resolved after taking extra strength Tylenol. She doesn't let the chest discomfort limit her activities, she works through it. She feels like the episodes of chest pain occur nearly every day. She has chronic 4 pillow orthopnea has been present for about 3 years and is no worse lately. She reports occasional pedal edema but has none at present. She has episodes of breaking out in a sweat frequently that are not related to her chest discomfort and has been occurring for the last 1-2 years, worse since her chemotherapy.  She says that her PCP had called Dr. Tamsen Snider who placed her renal artery stent and was told that it was okay to stop the Plavix. Her last dose of Plavix was on September 6. The patient was apparently told by Dr. Claiborne Billings that she would need to continue Plavix for her CAD. She also stopped taking aspirin on Friday in preparation for her surgery.  Past Medical History:  Diagnosis Date  . Asthma   . Bell's palsy   . Cardiomegaly   . Coronary artery disease    NSTEMI 08/2015 (65% LAD; D1 50%)  . Family history of breast cancer   . Family history of prostate cancer   . GERD (gastroesophageal reflux disease)   . Hypertension   . Lymphoma of lymph nodes of neck ([place])   . OSA (  obstructive sleep apnea)    negative results  . Renal artery stenosis (HCC)    s/p stent placment to left     Past Surgical History:  Procedure Laterality Date  . CARDIAC CATHETERIZATION N/A 09/18/2015   Procedure: Left Heart Cath and Coronary Angiography;  Surgeon: Leonie Man, MD;  Location: [facility];  Service: Cardiovascular;  Laterality: N/A;  . [place]   x2  . IR CV LINE INJECTION  07/24/2016  . PERIPHERAL VASCULAR CATHETERIZATION N/A 08/23/2014   Procedure: Renal Angiography;  Surgeon: Adrian Prows, MD;  Location: [facility];  Service: Cardiovascular;  Laterality: N/A;  . RENAL ARTERY STENT Left 08/23/2014  . SKIN BIOPSY  2000  . TUBAL LIGATION      Current Medications: Current Meds  Medication Sig  . acetaminophen (TYLENOL) 500 MG tablet Take 1,000 mg by mouth every 8 (eight) hours as needed for mild pain or headache.  . albuterol (PROVENTIL) (2.5 MG/3ML) 0.083% nebulizer solution Take 3 mLs (2.5 mg total) by nebulization every 6 (six) hours as needed for wheezing or shortness of breath.  Marland Kitchen amLODipine (NORVASC) 10 MG tablet Take 10 mg by mouth daily.  Marland Kitchen aspirin 81 MG chewable tablet Chew 81 mg by mouth daily.  Marland Kitchen atorvastatin (LIPITOR) 80 MG tablet Take 1 tablet (80 mg total) by mouth daily at 6 PM.  . betamethasone valerate ointment (VALISONE) 0.1 % Apply 1 application topically 2 (two) times daily.  Marland Kitchen lidocaine-prilocaine (EMLA) cream Apply 1 application topically as needed (for port).  . losartan (COZAAR) 50 MG tablet Take 50 mg by mouth daily.  . metoprolol succinate (TOPROL XL) 100 MG 24 hr tablet Take 1 tablet (100 mg total) by mouth daily. Take with or immediately following a meal.  . mometasone-formoterol (DULERA) 200-5 MCG/ACT AERO Inhale 2 puffs into the lungs 2 (two) times daily.  . nitroGLYCERIN (NITROSTAT) 0.4 MG SL tablet Place 0.4 mg under the tongue every 5 (five) minutes as needed for chest pain.  Marland Kitchen PROAIR HFA 108 (90 Base) MCG/ACT inhaler Inhale 2 puffs into the lungs every 6 (six) hours as needed for wheezing or shortness of breath.   . [DISCONTINUED] clopidogrel (PLAVIX) 75 MG tablet Take 1 tablet (75 mg total) by mouth daily with breakfast.  . [DISCONTINUED] isosorbide mononitrate (IMDUR) 30 MG 24 hr tablet Take 1 tablet (30 mg total) by mouth daily.  . [DISCONTINUED] lidocaine-prilocaine (EMLA) cream Apply to affected area once (Patient taking differently: Apply 1 application topically as needed (on port). Apply to affected area once)     Allergies:   Compazine [prochlorperazine]  and Ondansetron hcl   Social History   Social History  . Marital status: Single    Spouse name: N/A  . Number of children: 4  . Years of education: 9   Occupational History  . Unemployed    Social History Main Topics  . Smoking status: Current Every Day Smoker    Packs/day: 0.50    Years: 15.00    Types: Cigarettes  . Smokeless tobacco: Never Used     Comment: 6 cig per day  . Alcohol use No  . Drug use: No  . Sexual activity: Yes    Birth control/ protection: None   Other Topics Concern  . None   Social History Narrative   Lives at home with fiance and kids   Caffeine use: 2 cups coffee per day   4 Dr. Malachi Bonds (12oz) per  day      Family History: The patient's family history includes Asthma in her father; Breast cancer in her maternal aunt; Cervical cancer in her mother; Coronary artery disease (age of onset: 62) in her father; Hypertension in her mother; Prostate cancer in her cousin. There is no history of Migraines. ROS:   Please see the history of present illness.     All other systems reviewed and are negative.  EKGs/Labs/Other Studies Reviewed:    The following studies were reviewed today:   Left Heart Cath and Coronary Angiography   09/18/2015  Conclusion  1. Mid LAD to Dist LAD lesion, 65% stenosed. This is in a tortuous segment with several hinge points. Focal areas may be more significant, however the remainder the area looks relatively okay. 2. Mid LAD lesion, 45% stenosed. 3. Ost 1st Diag to 1st Diag lesion, 50% stenosed. 4. Known mild to moderately reduced EF by Echo today.   The patient had mild troponin elevation in the setting of hypertensive emergency/urgency and chest pain. Echo shows possible EF of roughly 45%. Final result pending.  I reviewed the images with Dr. Claiborne Billings, patient does have disease in the LAD, however this is a tortuous segment and there appears to be some spiraling of the vessel at this point. This would probably require  relatively significant stent placement in the mid LAD. We discussed potential options, and Dr. Claiborne Billings felt it best to try to opt for medical management first.  Plan:  Return to nursing unit for continued medical management.  I will restart heparin 8 hours with sheath removal based on non-STEMI.  Continue to treat blood pressure and titrate antianginals.  Cardiovascular risk factor modification with statin and glycemic control.  If she fails medical management, would consider PCI of the distal targeting the LAD.    Echocardiogram 09/18/2015 Study Conclusions - Left ventricle: The cavity size was normal. There was moderate   concentric hypertrophy. Systolic function was normal. The   estimated ejection fraction was 55%. Wall motion was normal;   there were no regional wall motion abnormalities. The study is   not technically sufficient to allow evaluation of LV diastolic   function. - Aortic valve: Trileaflet; normal thickness leaflets.   Transvalvular velocity was within the normal range. There was no   stenosis. There was no regurgitation. - Aortic root: The aortic root was normal in size. - Mitral valve: There was moderate regurgitation directed   centrally. Valve area by pressure half-time: 1.8 cm^2. - Right ventricle: Systolic function was normal. - Tricuspid valve: There was mild regurgitation. - Pulmonary arteries: Systolic pressure was mildly increased. PA   peak pressure: 37 mm Hg (S). - Inferior vena cava: The vessel was normal in size. - Pericardium, extracardiac: There was no pericardial effusion.  Renal angiography 08/23/2014  The right iliac artery showed mild luminal irregularity. There was no high-grade stenosis in the aortoiliac bifurcation.  Interventional data: Successful PTA and stenting of the left renal artery. High-grade 90% left renal artery stenosis, reduced to 0%. 6.0 x 18 mm Cordis Blue balloon-expandable stent implanted. Pre-PTA pressure gradient of  65 mmHg reduced to 0 mmHg.    EKG:  EKG is  ordered today.  The ekg ordered today demonstrates NSR 61 bpm, with TWI in leads V2-V5, similar to previous readings, QTC 440  Recent Labs: 12/19/2016: ALT 13; BUN 7; Creatinine, Ser 0.73; Hemoglobin 14.4; Platelets 256; Potassium 4.0; Sodium 138   Recent Lipid Panel    Component Value Date/Time  CHOL 183 09/18/2015 0340   TRIG 123 09/18/2015 0340   HDL 47 09/18/2015 0340   CHOLHDL 3.9 09/18/2015 0340   VLDL 25 09/18/2015 0340   LDLCALC 111 (H) 09/18/2015 0340    Physical Exam:    VS:  BP 120/76 (BP Location: Left Arm, Patient Position: Sitting, Cuff Size: Normal)   Pulse 69   Ht '5\' 3"'  (1.6 m)   Wt 170 lb (77.1 kg)   SpO2 98%   BMI 30.11 kg/m     Wt Readings from Last 3 Encounters:  12/24/16 170 lb (77.1 kg)  12/19/16 169 lb 6.4 oz (76.8 kg)  10/11/16 170 lb 8 oz (77.3 kg)     Physical Exam  Constitutional: She is oriented to person, place, and time. She appears well-developed and well-nourished. No distress.  HENT:  Head: Normocephalic.  Alopecia, wearing a wig.   Neck: Normal range of motion. Neck supple. No JVD present.  Cardiovascular: Normal rate, regular rhythm and normal heart sounds.  Exam reveals no gallop and no friction rub.   No murmur heard. Pulmonary/Chest: Effort normal and breath sounds normal. No respiratory distress.  Abdominal: Soft. Bowel sounds are normal. She exhibits no distension. There is no tenderness.  Musculoskeletal: Normal range of motion. She exhibits no edema or deformity.  Neurological: She is alert and oriented to person, place, and time.  Skin: Skin is warm and dry.  Psychiatric: She has a normal mood and affect. Her behavior is normal. Thought content normal.     ASSESSMENT:    1. Left renal artery stenosis (HCC)   2. Preoperative cardiovascular examination   3. Unstable angina pectoris (HCC)   4. Chest pain, unspecified type    PLAN:    In order of problems listed  above:  CAD: In 08/2015, patient was diagnosed with NSTEMI with mildly elevated troponin thought to be related to hypertensive urgency. LHC showed 65% stenosed LAD that was tortuous, no intervention done. She is treated with aspirin 81 mg, Plavix 75 mg, beta blocker, statin, long-acting nitrates. Plavix was stopped on 11/28/16. Aspirin was stopped 5 days ago in preparation for surgery. The patient continues to have left-sided chest pressure radiating to the left arm since her MI, but very mild. No associated symptoms of shortness of breath, lightheadedness or palpitations. EKG has TWI similar to previous in 2016. Discussed case with DOD, Dr. Radford Pax. With EKG changes and continued chest discomfort will check a stat troponin,  myoview and increase Imdur to 60 mg daily. Follow up in 1 week with results and tolerance to med change. Then will decide if OK to proceed with breast cancer surgery or further workup needed. Pt should continue on aspirin 81 mg daily through the surgery.  Hypertension: Blood pressures currently well controlled. Continue current medications.  Renal artery stenosis:  In 07/2014 was found to have high-grade 90% left renal artery stenosis, reduced to 0% with stent placed by Dr. Mariana Arn. Pt was taken off Plavix when PCP called Dr. Mariana Arn and was told that pt no longer needed to be on Plavix.   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 She had lymphoma many years ago. Currently receiving chemotherapy and plans for right simple mastectomy with radioactive seed targeted lymph node excision and right axillary Sentinel lymph node biopsy and right breast reconstruction after cleared by cardiology.   Medication Adjustments/Labs and Tests Ordered: Current medicines are  reviewed at length with the patient today.  Concerns regarding medicines are outlined above. Labs and  tests ordered and medication changes are outlined in the patient instructions below:  Patient Instructions  Medication Instructions:  Your physician has recommended you make the following change in your medication:  1.  INCREASE the Imdur to 60 mg taking 1 tablet daily   Labwork: TODAY:  STAT TROPONIN  Testing/Procedures: Your physician has requested that you have a lexiscan myoview ASAP.  For further information please visit [email or web address]. Please follow instruction sheet, as given.    Follow-Up: Your physician recommends that you schedule a follow-up appointment in: [place] [facility]   Any Other Special Instructions Will Be Listed Below (If Applicable).  Regadenoson injection What is this medicine? REGADENOSON is used to test the heart for coronary artery disease. It is used in patients who can not exercise for their stress test. This medicine may be used for other purposes; ask your health care provider or pharmacist if you have questions. COMMON BRAND NAME(S): Lexiscan What should I tell my health care provider before I take this medicine? They need to know if you have any of these conditions: -heart problems -lung or breathing disease, like asthma or COPD -an unusual or allergic reaction to regadenoson, other medicines, foods, dyes, or preservatives -pregnant or trying to get pregnant -breast-feeding How should I use this medicine? This medicine is for injection into a vein. It is given by a health care professional in a hospital or clinic setting. Talk to your pediatrician regarding the use of this medicine in children. Special care may be needed. Overdosage: If you think you have taken too much of this medicine contact a poison control center or emergency room at once. NOTE: This medicine is only for you. Do not share this medicine with others. What if I miss a dose? This does not apply. What may interact with this  medicine? -caffeine -dipyridamole -guarana -theophylline This list may not describe all possible interactions. Give your health care provider a list of all the medicines, herbs, non-prescription drugs, or dietary supplements you use. Also tell them if you smoke, drink alcohol, or use illegal drugs. Some items may interact with your medicine. What should I watch for while using this medicine? Your condition will be monitored carefully while you are receiving this medicine. Do not take medicines, foods, or drinks with caffeine (like coffee, tea, or colas) for at least 12 hours before your test. If you do not know if something contains caffeine, ask your health care professional. What side effects may I notice from receiving this medicine? Side effects that you should report to your doctor or health care professional as soon as possible: -allergic reactions like skin rash, itching or hives, swelling of the face, lips, or tongue -breathing problems -chest pain, tightness or palpitations -severe headache Side effects that usually do not require medical attention (report to your doctor or health care professional if they continue or are bothersome): -flushing -headache -irritation or pain at site where injected -nausea, vomiting This list may not describe all possible side effects. Call your doctor for medical advice about side effects. You may report side effects to FDA at 1-800-FDA-1088. Where should I keep my medicine? This drug is given in a hospital or clinic and will not be stored at home. NOTE: This sheet is a summary. It may not cover all possible information. If you have questions about this medicine, talk to your doctor, pharmacist,  or health care provider.  2018 Elsevier/Gold Standard (2007-11-09 15:08:13)    If you need a refill on your cardiac medications before your next appointment, please call your pharmacy.      Signed, Daune Perch, NP  12/24/2016 4:50 PM    [facility]

## 2016-12-24 ENCOUNTER — Ambulatory Visit (INDEPENDENT_AMBULATORY_CARE_PROVIDER_SITE_OTHER): Payer: Medicaid Other | Admitting: Cardiology

## 2016-12-24 ENCOUNTER — Observation Stay: Admission: AD | Admit: 2016-12-24 | Payer: Medicaid Other | Source: Ambulatory Visit | Admitting: Cardiology

## 2016-12-24 ENCOUNTER — Encounter: Payer: Self-pay | Admitting: Physician Assistant

## 2016-12-24 VITALS — BP 120/76 | HR 69 | Ht 63.0 in | Wt 170.0 lb

## 2016-12-24 DIAGNOSIS — I200 Unstable angina: Secondary | ICD-10-CM

## 2016-12-24 DIAGNOSIS — I10 Essential (primary) hypertension: Secondary | ICD-10-CM | POA: Diagnosis not present

## 2016-12-24 DIAGNOSIS — I25119 Atherosclerotic heart disease of native coronary artery with unspecified angina pectoris: Secondary | ICD-10-CM

## 2016-12-24 DIAGNOSIS — R079 Chest pain, unspecified: Secondary | ICD-10-CM | POA: Diagnosis not present

## 2016-12-24 DIAGNOSIS — C50811 Malignant neoplasm of overlapping sites of right female breast: Secondary | ICD-10-CM | POA: Diagnosis not present

## 2016-12-24 DIAGNOSIS — Z170 Estrogen receptor positive status [ER+]: Secondary | ICD-10-CM | POA: Diagnosis not present

## 2016-12-24 DIAGNOSIS — Z01810 Encounter for preprocedural cardiovascular examination: Secondary | ICD-10-CM

## 2016-12-24 DIAGNOSIS — I701 Atherosclerosis of renal artery: Secondary | ICD-10-CM | POA: Diagnosis not present

## 2016-12-24 MED ORDER — ISOSORBIDE MONONITRATE ER 60 MG PO TB24: 60.0000 mg | ORAL_TABLET | Freq: Every day | ORAL | 3 refills | Status: DC

## 2016-12-24 NOTE — Telephone Encounter (Signed)
Patient was seen by an APP today with plan for preoperative testing prior to final surgical clearance

## 2016-12-24 NOTE — Patient Instructions (Addendum)
Medication Instructions:  Your physician has recommended you make the following change in your medication:  1.  INCREASE the Imdur to 60 mg taking 1 tablet daily   Labwork: TODAY:  STAT TROPONIN  Testing/Procedures: Your physician has requested that you have a lexiscan myoview ASAP.  For further information please visit [email or web address]. Please follow instruction sheet, as given.    Follow-Up: Your physician recommends that you schedule a follow-up appointment in: [place] APP   Any Other Special Instructions Will Be Listed Below (If Applicable).  Regadenoson injection What is this medicine? REGADENOSON is used to test the heart for coronary artery disease. It is used in patients who can not exercise for their stress test. This medicine may be used for other purposes; ask your health care provider or pharmacist if you have questions. COMMON BRAND NAME(S): Lexiscan What should I tell my health care provider before I take this medicine? They need to know if you have any of these conditions: -heart problems -lung or breathing disease, like asthma or COPD -an unusual or allergic reaction to regadenoson, other medicines, foods, dyes, or preservatives -pregnant or trying to get pregnant -breast-feeding How should I use this medicine? This medicine is for injection into a vein. It is given by a health care professional in a hospital or clinic setting. Talk to your pediatrician regarding the use of this medicine in children. Special care may be needed. Overdosage: If you think you have taken too much of this medicine contact a poison control center or emergency room at once. NOTE: This medicine is only for you. Do not share this medicine with others. What if I miss a dose? This does not apply. What may interact with this medicine? -caffeine -dipyridamole -guarana -theophylline This list may not describe all possible interactions. Give your health care  provider a list of all the medicines, herbs, non-prescription drugs, or dietary supplements you use. Also tell them if you smoke, drink alcohol, or use illegal drugs. Some items may interact with your medicine. What should I watch for while using this medicine? Your condition will be monitored carefully while you are receiving this medicine. Do not take medicines, foods, or drinks with caffeine (like coffee, tea, or colas) for at least 12 hours before your test. If you do not know if something contains caffeine, ask your health care professional. What side effects may I notice from receiving this medicine? Side effects that you should report to your doctor or health care professional as soon as possible: -allergic reactions like skin rash, itching or hives, swelling of the face, lips, or tongue -breathing problems -chest pain, tightness or palpitations -severe headache Side effects that usually do not require medical attention (report to your doctor or health care professional if they continue or are bothersome): -flushing -headache -irritation or pain at site where injected -nausea, vomiting This list may not describe all possible side effects. Call your doctor for medical advice about side effects. You may report side effects to FDA at 1-800-FDA-1088. Where should I keep my medicine? This drug is given in a hospital or clinic and will not be stored at home. NOTE: This sheet is a summary. It may not cover all possible information. If you have questions about this medicine, talk to your doctor, pharmacist, or health care provider.  2018 Elsevier/Gold Standard (2007-11-09 15:08:13)    If you need a refill on your cardiac medications before your next appointment, please call your pharmacy.

## 2016-12-25 ENCOUNTER — Encounter (HOSPITAL_COMMUNITY): Admission: RE | Payer: Self-pay | Source: Ambulatory Visit

## 2016-12-25 ENCOUNTER — Encounter (HOSPITAL_COMMUNITY): Payer: Medicaid Other

## 2016-12-25 ENCOUNTER — Telehealth: Payer: Self-pay | Admitting: *Deleted

## 2016-12-25 ENCOUNTER — Ambulatory Visit (HOSPITAL_COMMUNITY): Admission: RE | Admit: 2016-12-25 | Payer: Medicaid Other | Source: Ambulatory Visit | Admitting: Surgery

## 2016-12-25 ENCOUNTER — Telehealth (HOSPITAL_COMMUNITY): Payer: Self-pay | Admitting: *Deleted

## 2016-12-25 LAB — TROPONIN I

## 2016-12-25 SURGERY — MASTECTOMY WITH RADIOACTIVE SEED GUIDED EXCISION AND AXILLARY SENTINEL LYMPH NODE BIOPSY
Anesthesia: General | Site: Breast | Laterality: Right

## 2016-12-25 NOTE — Telephone Encounter (Signed)
Left message on voicemail per DPR in reference to upcoming appointment scheduled on 12/30/16 with detailed instructions given per Myocardial Perfusion Study Information Sheet for the test. LM to arrive 15 minutes early, and that it is imperative to arrive on time for appointment to keep from having the test rescheduled. If you need to cancel or reschedule your appointment, please call the office within 24 hours of your appointment. Failure to do so may result in a cancellation of your appointment, and a $50 no show fee. Phone number given for call back for any questions.  Kirstie Peri

## 2016-12-25 NOTE — Telephone Encounter (Signed)
Patient informed and verbalized understanding of plan. 

## 2016-12-25 NOTE — Telephone Encounter (Signed)
-----   Message from Daune Perch, NP sent at 12/25/2016  7:33 AM EDT ----- Please call pt to inform her that her troponin was negative meaning no heart attack or recent heart damage. She is OK to proceed with stress test.  Daune Perch, NP

## 2016-12-26 NOTE — Telephone Encounter (Signed)
Patient seen in office 12/24/16.

## 2016-12-30 ENCOUNTER — Ambulatory Visit (HOSPITAL_COMMUNITY): Payer: Medicaid Other | Attending: Internal Medicine

## 2016-12-30 DIAGNOSIS — I2510 Atherosclerotic heart disease of native coronary artery without angina pectoris: Secondary | ICD-10-CM | POA: Diagnosis not present

## 2016-12-30 DIAGNOSIS — Z01810 Encounter for preprocedural cardiovascular examination: Secondary | ICD-10-CM | POA: Insufficient documentation

## 2016-12-30 DIAGNOSIS — R0609 Other forms of dyspnea: Secondary | ICD-10-CM | POA: Insufficient documentation

## 2016-12-30 DIAGNOSIS — R079 Chest pain, unspecified: Secondary | ICD-10-CM | POA: Diagnosis not present

## 2016-12-30 DIAGNOSIS — I10 Essential (primary) hypertension: Secondary | ICD-10-CM | POA: Insufficient documentation

## 2016-12-30 DIAGNOSIS — R9439 Abnormal result of other cardiovascular function study: Secondary | ICD-10-CM | POA: Diagnosis not present

## 2016-12-30 LAB — MYOCARDIAL PERFUSION IMAGING
CHL CUP NUCLEAR SDS: 3
CHL CUP NUCLEAR SRS: 10
CSEPPHR: 101 {beats}/min
LV dias vol: 135 mL (ref 46–106)
LV sys vol: 77 mL
RATE: 0.28
Rest HR: 66 {beats}/min
SSS: 13
TID: 1.08

## 2016-12-30 MED ORDER — TECHNETIUM TC 99M TETROFOSMIN IV KIT
10.7000 | PACK | Freq: Once | INTRAVENOUS | Status: AC | PRN
  Administered 2016-12-30: 10.7 via INTRAVENOUS
  Filled 2016-12-30: qty 11

## 2016-12-30 MED ORDER — TECHNETIUM TC 99M TETROFOSMIN IV KIT
32.7000 | PACK | Freq: Once | INTRAVENOUS | Status: AC | PRN
  Administered 2016-12-30: 32.7 via INTRAVENOUS
  Filled 2016-12-30: qty 33

## 2016-12-30 MED ORDER — REGADENOSON 0.4 MG/5ML IV SOLN
0.4000 mg | Freq: Once | INTRAVENOUS | Status: AC
  Administered 2016-12-30: 0.4 mg via INTRAVENOUS

## 2017-01-01 ENCOUNTER — Ambulatory Visit (HOSPITAL_BASED_OUTPATIENT_CLINIC_OR_DEPARTMENT_OTHER): Payer: Medicaid Other | Admitting: Hematology and Oncology

## 2017-01-01 ENCOUNTER — Other Ambulatory Visit: Payer: Self-pay | Admitting: Emergency Medicine

## 2017-01-01 DIAGNOSIS — C50811 Malignant neoplasm of overlapping sites of right female breast: Secondary | ICD-10-CM | POA: Diagnosis not present

## 2017-01-01 DIAGNOSIS — Z170 Estrogen receptor positive status [ER+]: Secondary | ICD-10-CM | POA: Diagnosis not present

## 2017-01-01 MED ORDER — TAMOXIFEN CITRATE 20 MG PO TABS: 20.0000 mg | ORAL_TABLET | Freq: Every day | ORAL | 3 refills | Status: DC

## 2017-01-01 MED ORDER — VENLAFAXINE HCL ER 37.5 MG PO CP24: 37.5000 mg | ORAL_CAPSULE | Freq: Every day | ORAL | 6 refills | Status: DC

## 2017-01-01 NOTE — Assessment & Plan Note (Signed)
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  Recommendationbased on multidisciplinary tumor board: 1. Neoadjuvant chemotherapy with Taxotereand Cytoxan every 3 weeks4 completed July 2018 2. Followed by breast conserving surgery with sentinel lymph node study vs targeted axillary dissection (delayed due to cardiac issues requiring cardiac clearance) 3. Followed by adjuvant radiation therapy ------------------------------------------------------------------------- Patient has been without any treatment for the past 3 months and is now complaining of worsening pain in the right breast. I called and discussed the case with Dr. Brantley Stage. He is awaiting a call from cardiology anytime. She has an appointment with cardiology on 01/06/2017.  In the meantime I recommended that she start tamoxifen 20 mg daily today.  Return to clinic in 3 months for follow-up.

## 2017-01-01 NOTE — Progress Notes (Signed)
Cardiology Office Note    Date:  01/02/2017   ID:  Mazi Schuff, DOB 27-Oct-1970, MRN [id number]  PCP:  Javier Docker, MD  Cardiologist: Dr. Claiborne Billings   Chief Complaint  Patient presents with  . Follow-up    s/p stress test     History of Present Illness:    [name] is a 46 y.o. 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   She was last examined by Daune Perch, NP on 12/24/2016 for preoperative cardiac clearance in regards to upcoming breast cancer surgery with mastectomy and lymph node excision with sentinel lymph node biopsy and right breast reconstruction. Reported at that time having left-sided chest pressure with minimal exertion such as washing dishes or vacuuming. An EKG was obtained and showed TWI along V2-V5. With her concerning symptoms, a STAT troponin was checked and negative. Imdur was increased to 60mg  daily and a stress test was ordered to assess for ischemia. This was performed on 12/30/2016 and was intermediate risk, showing a prior apical infarct and very mild peri-infarct ischemia; EF was estimated at 43% with apical akinesis. The study was intermediate risk due to reduced LV function.   In talking with the patient today, she reports improvement in her symptoms with the recent addition of Imdur. She describes the pain that she does experience as an aching sensation along her left breast which can occur at rest or with activity. This has been present for over a year according to her report. She denies any dyspnea, palpitations, orthopnea, PND, or lower extremity edema. She has stopped taking ASA and Plavix as she was unsure when her surgery would be rescheduled.  Past  Medical History:  Diagnosis Date  . Asthma   . Bell's palsy   . Cardiomegaly   . Coronary artery disease    NSTEMI 08/2015 (65% LAD; D1 50%)  . Family history of breast cancer   . Family history of prostate cancer   . GERD (gastroesophageal reflux disease)   . Hypertension   . 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 OSA (obstructive sleep apnea)    negative results  . Renal artery stenosis ([facility])    s/p stent placment to left     Past Surgical History:  Procedure Laterality Date  . CARDIAC CATHETERIZATION N/A 09/18/2015   Procedure: Left Heart Cath and Coronary Angiography;  Surgeon: Leonie Man, MD;  Location: [facility];  Service: Cardiovascular;  Laterality: N/A;  . [place]   x2  . IR CV LINE INJECTION  07/24/2016  . PERIPHERAL VASCULAR CATHETERIZATION N/A 08/23/2014   Procedure: Renal Angiography;  Surgeon: Adrian Prows, MD;  Location: [facility];  Service: Cardiovascular;  Laterality: N/A;  . RENAL ARTERY STENT Left 08/23/2014  . SKIN BIOPSY  2000  . TUBAL LIGATION      Current Medications: Outpatient Medications Prior to Visit  Medication Sig Dispense Refill  . acetaminophen (TYLENOL) 500 MG tablet Take 1,000 mg by mouth every 8 (eight) hours as needed for mild pain or headache.    . albuterol (PROVENTIL) (2.5 MG/3ML) 0.083% nebulizer solution Take 3 mLs (2.5 mg total) by nebulization every 6 (six)  hours as needed for wheezing or shortness of breath. 75 mL 12  . amLODipine (NORVASC) 10 MG tablet Take 10 mg by mouth daily.    Marland Kitchen aspirin 81 MG chewable tablet Chew 81 mg by mouth daily.    Marland Kitchen atorvastatin (LIPITOR) 80 MG tablet Take 1 tablet (80 mg total) by mouth daily at 6 PM. 30 tablet 0  . betamethasone valerate ointment (VALISONE) 0.1 % Apply 1 application topically 2 (two) times daily. 45 g 1  . isosorbide mononitrate (IMDUR) 60 MG 24 hr tablet Take 1 tablet (60 mg total) by mouth daily. 90 tablet 3  . lidocaine-prilocaine (EMLA)  cream Apply 1 application topically as needed (for port).    . losartan (COZAAR) 50 MG tablet Take 50 mg by mouth daily.  3  . metoprolol succinate (TOPROL XL) 100 MG 24 hr tablet Take 1 tablet (100 mg total) by mouth daily. Take with or immediately following a meal. 30 tablet 0  . mometasone-formoterol (DULERA) 200-5 MCG/ACT AERO Inhale 2 puffs into the lungs 2 (two) times daily. 1 Inhaler 1  . nitroGLYCERIN (NITROSTAT) 0.4 MG SL tablet Place 0.4 mg under the tongue every 5 (five) minutes as needed for chest pain.    Marland Kitchen PROAIR HFA 108 (90 Base) MCG/ACT inhaler Inhale 2 puffs into the lungs every 6 (six) hours as needed for wheezing or shortness of breath.   3  . tamoxifen (NOLVADEX) 20 MG tablet Take 1 tablet (20 mg total) by mouth daily. 90 tablet 3  . venlafaxine XR (EFFEXOR-XR) 37.5 MG 24 hr capsule Take 1 capsule (37.5 mg total) by mouth daily with breakfast. 30 capsule 6   No facility-administered medications prior to visit.      Allergies:   Compazine [prochlorperazine] and Ondansetron hcl   Social History   Social History  . Marital status: Single    Spouse name: N/A  . Number of children: 4  . Years of education: 9   Occupational History  . Unemployed    Social History Main Topics  . Smoking status: Current Every Day Smoker    Packs/day: 0.50    Years: 15.00    Types: Cigarettes  . Smokeless tobacco: Never Used     Comment: 6 cig per day  . Alcohol use No  . Drug use: No  . Sexual activity: Yes    Birth control/ protection: None   Other Topics Concern  . None   Social History Narrative   Lives at home with fiance and kids   Caffeine use: 2 cups coffee per day   4 Dr. Malachi Bonds (12oz) per day      Family History:  The patient's family history includes Asthma in her father; Breast cancer in her maternal aunt; Cervical cancer in her mother; Coronary artery disease (age of onset: 41) in her father; Hypertension in her mother; Prostate cancer in her cousin.   Review of  Systems:   Please see the history of present illness.     General:  No chills, fever, night sweats or weight changes.  Cardiovascular:  No dyspnea on exertion, edema, orthopnea, palpitations, paroxysmal nocturnal dyspnea. Positive for chest pain (improved).  Dermatological: No rash, lesions/masses Respiratory: No cough, dyspnea Urologic: No hematuria, dysuria Abdominal:   No nausea, vomiting, diarrhea, bright red blood per rectum, melena, or hematemesis Neurologic:  No visual changes, wkns, changes in mental status. All other systems reviewed and are otherwise negative except as noted above.   Physical Exam:  VS:  BP 124/72 (BP Location: Left Arm, Patient Position: Sitting, Cuff Size: Normal)   Pulse 72   Ht 5\' 3"  (1.6 m)   Wt 168 lb (76.2 kg)   BMI 29.76 kg/m    General: Well developed, well nourished [place] American female appearing in no acute distress. Head: Normocephalic, atraumatic, sclera non-icteric, no xanthomas, nares are without discharge.  Neck: No carotid bruits. JVD not elevated.  Lungs: Respirations regular and unlabored, without wheezes or rales.  Heart: Regular rate and rhythm. No S3 or S4.  No murmur, no rubs, or gallops appreciated. Abdomen: Soft, non-tender, non-distended with normoactive bowel sounds. No hepatomegaly. No rebound/guarding. No obvious abdominal masses. Msk:  Strength and tone appear normal for age. No joint deformities or effusions. Extremities: No clubbing or cyanosis. No lower extremity edema.  Distal pedal pulses are 2+ bilaterally. Neuro: Alert and oriented X 3. Moves all extremities spontaneously. No focal deficits noted. Psych:  Responds to questions appropriately with a normal affect. Skin: No rashes or lesions noted  Wt Readings from Last 3 Encounters:  01/02/17 168 lb (76.2 kg)  01/01/17 168 lb 3.2 oz (76.3 kg)  12/30/16 170 lb (77.1 kg)     Studies/Labs Reviewed:   EKG:  EKG is not ordered today.   Recent Labs: 12/19/2016:  ALT 13; BUN 7; Creatinine, Ser 0.73; Hemoglobin 14.4; Platelets 256; Potassium 4.0; Sodium 138   Lipid Panel    Component Value Date/Time   CHOL 183 09/18/2015 0340   TRIG 123 09/18/2015 0340   HDL 47 09/18/2015 0340   CHOLHDL 3.9 09/18/2015 0340   VLDL 25 09/18/2015 0340   LDLCALC 111 (H) 09/18/2015 0340    Additional studies/ records that were reviewed today include:   NST: 12/2016  Nuclear stress EF: 43%.  There was no ST segment deviation noted during stress.  Defect 1: There is a medium defect of severe severity present in the apical anterior, apical inferior and apex location.  This is an intermediate risk study.  The left ventricular ejection fraction is moderately decreased (30-44%).   Intermediate risk stress nuclear study with prior apical infarct and very mild peri-infarct ischemia; EF 43 with apical akinesis; study intermediate risk due to reduced LV function; note increased uptake right breast concerning for breast mass.  Cardiac Catheterization: 08/2015 1. Mid LAD to Dist LAD lesion, 65% stenosed. This is in a tortuous segment with several hinge points. Focal areas may be more significant, however the remainder the area looks relatively okay. 2. Mid LAD lesion, 45% stenosed. 3. Ost 1st Diag to 1st Diag lesion, 50% stenosed. 4. Known mild to moderately reduced EF by Echo today.   The patient had mild troponin elevation in the setting of hypertensive emergency/urgency and chest pain. Echo shows possible EF of roughly 45%. Final result pending.  I reviewed the images with Dr. Claiborne Billings, patient does have disease in the LAD, however this is a tortuous segment and there appears to be some spiraling of the vessel at this point. This would probably require relatively significant stent placement in the mid LAD. We discussed potential options, and Dr. Claiborne Billings felt it best to try to opt for medical management first.  Plan:  Return to nursing unit for continued medical  management.  I will restart heparin 8 hours with sheath removal based on non-STEMI.  Continue to treat blood pressure and titrate antianginals.  Cardiovascular risk factor modification with statin and glycemic control.  If she fails medical management, would consider PCI of  the distal targeting the LAD.  Assessment:    1. Preoperative cardiovascular examination   2. Coronary artery disease involving native coronary artery of native heart with angina pectoris ([facility])   3. Essential (primary) hypertension   4. Hyperlipidemia LDL goal <70   5. Malignant neoplasm of overlapping sites of right breast in female, estrogen receptor positive ([facility])      Plan:   In order of problems listed above:  1. Preoperative Cardiac Clearance for mastectomy and lymph node excision with sentinel lymph node biopsy and right breast reconstruction - the patient has a known cardiac history having a recent cath in 08/2015 showing a tortuous 65% LAD stenosis with medical management pursued at that time. Recent NST showed a prior apical infarct and very mild peri-infarct ischemia, overall being intermediate-risk due to EF reported at 43%. Discussed results of the stress test with Dr. Claiborne Billings, EF likely falsely low as common to occur on stress test. Can consider a repeat echo following surgery. With only mild peri-infarct ischemia, would not pursue further ischemic evaluation at this time. She is of acceptable risk to proceed with the above procedure. Will forward today's note to Dr. Brantley Stage and Dr. Lindi Adie. I informed the patient that if her surgery is going to be more than 2 weeks from now, then she should restart ASA and hold prior to surgery per Dr. Josetta Huddle recommendations.   2. CAD - known 65% LAD stenosis as above with recent NST showing mild ischemia.  - her symptoms improved with the addition of Imdur.  - continue ASA, Amlodipine, BB, Imdur, and statin therapy.  - will follow-up with Dr. Claiborne Billings after her surgery  to reassess for recurrent anginal symptoms.   3. HTN - BP is well-controlled at 124/72 during today's visit.  - continue current medication regimen.   4. HLD - Lipid Panel in 2017 showed LDL of 111. No repeat FLP available for review in Epic since. She is not fasting today but she have a FLP at her next visit.  - continue Atorvastatin 80mg  daily with goal LDL < 70 with known CAD.   5. Breast Cancer - followed by Dr. Brantley Stage and Dr. Lindi Adie with plans for an upcoming mastectomy as above.    Medication Adjustments/Labs and Tests Ordered: Current medicines are reviewed at length with the patient today.  Concerns regarding medicines are outlined above.  Medication changes, Labs and Tests ordered today are listed in the Patient Instructions below. Patient Instructions  Medication Instructions: Your physician recommends that you continue on your current medications as directed. Please refer to the Current Medication list given to you today.  Follow-Up: Your physician recommends that you schedule a follow-up appointment in: 3 months with Dr. Claiborne Billings.  Any Other Special Instructions are listed below:  You have been cleared for surgery!   Signed, Erma Heritage, PA-C  01/02/2017 1:59 PM    [facility] [place], [place], [place]  [address] Phone: [phone]; Fax: [phone]  [address], [place], [place] [address] Phone: [phone]

## 2017-01-01 NOTE — Progress Notes (Signed)
Patient Care Team: Javier Docker, MD as PCP - General (Internal Medicine) Erroll Luna, MD as Consulting Physician (General Surgery) Nicholas Lose, MD as Consulting Physician (Hematology and Oncology) Kyung Rudd, MD as Consulting Physician (Radiation Oncology) Delice Bison Charlestine Massed, NP as Nurse Practitioner (Hematology and Oncology)  DIAGNOSIS:  Encounter Diagnosis  Name Primary?  . Malignant neoplasm of overlapping sites of right breast in female, estrogen receptor positive ([facility])     SUMMARY OF ONCOLOGIC HISTORY:   Malignant neoplasm of overlapping sites of right breast in female, estrogen receptor positive ([facility])   07/18/2016 Initial Diagnosis    Palpable right breast masses for 1 year; 2 adjacent spiculated masses by ultrasound at 1:00 subareolar 4.2 cm mass; 3 satellite nodules at 9:00; 2 abnormal lymph nodes; biopsy of the 2 masses and the lymph node showed similar-appearing grade 3 IDC ER 100% PR 95% Ki-67 20-30%, HER-2 negative ratio 1.57; T2 N1 stage IIB (AJCC 8)      08/09/2016 - 10/11/2016 Neo-Adjuvant Chemotherapy    Taxotere and Cytoxan every 3 weeks 4 cycles      08/13/2016 Genetic Testing    Genetic testing is pending      08/22/2016 Genetic Testing    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  The Hereditary Gene Panel offered by [facility] includes sequencing and/or deletion duplication testing of the following 46 genes: APC, ATM, AXIN2, BARD1, BMPR1A, BRCA1, BRCA2, BRIP1, CDH1, CDKN2A (p14ARF), CDKN2A (p16INK4a), CHEK2, CTNNA1, DICER1, EPCAM (Deletion/duplication testing only), GREM1 (promoter region deletion/duplication testing only), KIT, MEN1, MLH1, MSH2, MSH3, MSH6, MUTYH, NBN, NF1, NHTL1, PALB2, PDGFRA, PMS2, POLD1, POLE, PTEN, RAD50, RAD51C, RAD51D, SDHB, SDHC, SDHD, SMAD4, SMARCA4. STK11, TP53, TSC1, TSC2, and VHL.  The following genes were evaluated for sequence changes only: SDHA and HOXB13 c.251G>A variant only.  The report  date is Aug 22, 2016.       01/01/2017 -  Anti-estrogen oral therapy    Tamoxifen 20 mg daily       CHIEF COMPLIANT: Patient has not had breast surgery because of cardiac issues  INTERVAL HISTORY: Jasiel Apachito is a 46 year old above-mentioned history of right breast cancer received 4 cycles of neoadjuvant chemotherapy with Taxotere and Cytoxan. In spite of this she did not respond very well. She was referred to surgery and because of her cardiac issues are surgery has been postponed. She just had a stress test and is going to see her cardiologist on October 15. She tells me that the right breast is more painful than before. She is having also pain in the right axilla.  REVIEW OF SYSTEMS:   Constitutional: Denies fevers, chills or abnormal weight loss Eyes: Denies blurriness of vision Ears, nose, mouth, throat, and face: Denies mucositis or sore throat Respiratory: Denies cough, dyspnea or wheezes Cardiovascular: Denies palpitation, chest discomfort Gastrointestinal:  Denies nausea, heartburn or change in bowel habits Skin: Denies abnormal skin rashes Lymphatics: Denies new lymphadenopathy or easy bruising Neurological:Denies numbness, tingling or new weaknesses Behavioral/Psych: Mood is stable, no new changes  Extremities: No lower extremity edema Breast: Right breast and right axillary pain All other systems were reviewed with the patient and are negative.  I have reviewed the past medical history, past surgical history, social history and family history with the patient and they are unchanged from previous note.  ALLERGIES:  is allergic to compazine [prochlorperazine] and ondansetron hcl.  MEDICATIONS:  Current Outpatient Prescriptions  Medication Sig Dispense Refill  . acetaminophen (TYLENOL) 500 MG tablet Take  1,000 mg by mouth every 8 (eight) hours as needed for mild pain or headache.    . albuterol (PROVENTIL) (2.5 MG/3ML) 0.083% nebulizer solution Take 3 mLs (2.5 mg total)  by nebulization every 6 (six) hours as needed for wheezing or shortness of breath. 75 mL 12  . amLODipine (NORVASC) 10 MG tablet Take 10 mg by mouth daily.    Marland Kitchen aspirin 81 MG chewable tablet Chew 81 mg by mouth daily.    Marland Kitchen atorvastatin (LIPITOR) 80 MG tablet Take 1 tablet (80 mg total) by mouth daily at 6 PM. 30 tablet 0  . betamethasone valerate ointment (VALISONE) 0.1 % Apply 1 application topically 2 (two) times daily. 45 g 1  . isosorbide mononitrate (IMDUR) 60 MG 24 hr tablet Take 1 tablet (60 mg total) by mouth daily. 90 tablet 3  . lidocaine-prilocaine (EMLA) cream Apply 1 application topically as needed (for port).    . losartan (COZAAR) 50 MG tablet Take 50 mg by mouth daily.  3  . metoprolol succinate (TOPROL XL) 100 MG 24 hr tablet Take 1 tablet (100 mg total) by mouth daily. Take with or immediately following a meal. 30 tablet 0  . mometasone-formoterol (DULERA) 200-5 MCG/ACT AERO Inhale 2 puffs into the lungs 2 (two) times daily. 1 Inhaler 1  . nitroGLYCERIN (NITROSTAT) 0.4 MG SL tablet Place 0.4 mg under the tongue every 5 (five) minutes as needed for chest pain.    Marland Kitchen PROAIR HFA 108 (90 Base) MCG/ACT inhaler Inhale 2 puffs into the lungs every 6 (six) hours as needed for wheezing or shortness of breath.   3  . tamoxifen (NOLVADEX) 20 MG tablet Take 1 tablet (20 mg total) by mouth daily. 90 tablet 3  . venlafaxine XR (EFFEXOR-XR) 37.5 MG 24 hr capsule Take 1 capsule (37.5 mg total) by mouth daily with breakfast. 30 capsule 6   No current facility-administered medications for this visit.     PHYSICAL EXAMINATION: ECOG PERFORMANCE STATUS: 1 - Symptomatic but completely ambulatory  Vitals:   01/01/17 1105  BP: 114/69  Pulse: 67  Resp: 18  Temp: 98 F (36.7 C)  SpO2: 99%   Filed Weights   01/01/17 1105  Weight: 168 lb 3.2 oz (76.3 kg)    GENERAL:alert, no distress and comfortable SKIN: skin color, texture, turgor are normal, no rashes or significant lesions EYES: normal,  Conjunctiva are pink and non-injected, sclera clear OROPHARYNX:no exudate, no erythema and lips, buccal mucosa, and tongue normal  NECK: supple, thyroid normal size, non-tender, without nodularity LYMPH:  no palpable lymphadenopathy in the cervical, axillary or inguinal LUNGS: clear to auscultation and percussion with normal breathing effort HEART: regular rate & rhythm and no murmurs and no lower extremity edema ABDOMEN:abdomen soft, non-tender and normal bowel sounds MUSCULOSKELETAL:no cyanosis of digits and no clubbing  NEURO: alert & oriented x 3 with fluent speech, no focal motor/sensory deficits EXTREMITIES: No lower extremity edema BREAST:Palpable mass in the right breast is freely mobile but it's tender to palpation throughout the breast and tenderness in the right axillary lymph nodes. (exam performed in the presence of a chaperone)  LABORATORY DATA:  I have reviewed the data as listed   Chemistry      Component Value Date/Time   NA 138 12/19/2016 0948   NA 142 10/11/2016 0955   K 4.0 12/19/2016 0948   K 3.2 (L) 10/11/2016 0955   CL 107 12/19/2016 0948   CO2 21 (L) 12/19/2016 0948   CO2 23 10/11/2016  0955   BUN 7 12/19/2016 0948   BUN 6.8 (L) 10/11/2016 0955   CREATININE 0.73 12/19/2016 0948   CREATININE 0.7 10/11/2016 0955      Component Value Date/Time   CALCIUM 9.7 12/19/2016 0948   CALCIUM 9.5 10/11/2016 0955   ALKPHOS 99 12/19/2016 0948   ALKPHOS 85 10/11/2016 0955   AST 18 12/19/2016 0948   AST 10 10/11/2016 0955   ALT 13 (L) 12/19/2016 0948   ALT 10 10/11/2016 0955   BILITOT 0.3 12/19/2016 0948   BILITOT 0.44 10/11/2016 0955       Lab Results  Component Value Date   WBC 9.3 12/19/2016   HGB 14.4 12/19/2016   HCT 43.7 12/19/2016   MCV 94.6 12/19/2016   PLT 256 12/19/2016   NEUTROABS 5.1 12/19/2016    ASSESSMENT & PLAN:  Malignant neoplasm of overlapping sites of right breast in female, estrogen receptor positive ([facility]) 07/18/2016: Palpable right  breast masses for 1 year; 2 adjacent spiculated masses by ultrasound at 1:00 subareolar 4.2 cm mass; 3 satellite nodules at 9:00; 2 abnormal lymph nodes; biopsy of the 2 masses and the lymph node showed similar-appearing 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  Recommendationbased on multidisciplinary tumor board: 1. Neoadjuvant chemotherapy with Taxotereand Cytoxan every 3 weeks4  completed July 2018 2. Followed by breast conserving surgery with sentinel lymph node study vs targeted axillary dissection (delayed due to cardiac issues requiring cardiac clearance) 3. Followed by adjuvant radiation therapy ------------------------------------------------------------------------- Patient has been without any treatment for the past 3 months and is now complaining of worsening pain in the right breast. I called and discussed the case with Dr. Brantley Stage. He is awaiting a call from cardiology anytime. She has an appointment with cardiology on 01/06/2017.  In the meantime I recommended that she start tamoxifen 20 mg daily today. Tamoxifen counseling: We discussed the risks and benefits of tamoxifen. These include but not limited to insomnia, hot flashes, mood changes, vaginal dryness, and weight gain. Although rare, serious side effects including endometrial cancer, risk of blood clots were also discussed. We strongly believe that the benefits far outweigh the risks. Patient understands these risks and consented to starting treatment.    Return to clinic in 3 months for follow-up.   I spent 25 minutes talking to the patient of which more than half was spent in counseling and coordination of care.  No orders of the defined types were placed in this encounter.  The patient has a good understanding of the overall plan. she agrees with it. she  will call with any problems that may develop before the next visit here.   Rulon Eisenmenger, MD 01/01/17

## 2017-01-02 ENCOUNTER — Telehealth: Payer: Self-pay | Admitting: Hematology and Oncology

## 2017-01-02 ENCOUNTER — Ambulatory Visit (INDEPENDENT_AMBULATORY_CARE_PROVIDER_SITE_OTHER): Payer: Medicaid Other | Admitting: Student

## 2017-01-02 ENCOUNTER — Encounter: Payer: Self-pay | Admitting: Student

## 2017-01-02 VITALS — BP 124/72 | HR 72 | Ht 63.0 in | Wt 168.0 lb

## 2017-01-02 DIAGNOSIS — I10 Essential (primary) hypertension: Secondary | ICD-10-CM | POA: Diagnosis not present

## 2017-01-02 DIAGNOSIS — E785 Hyperlipidemia, unspecified: Secondary | ICD-10-CM

## 2017-01-02 DIAGNOSIS — C50811 Malignant neoplasm of overlapping sites of right female breast: Secondary | ICD-10-CM | POA: Diagnosis not present

## 2017-01-02 DIAGNOSIS — Z170 Estrogen receptor positive status [ER+]: Secondary | ICD-10-CM

## 2017-01-02 DIAGNOSIS — I25119 Atherosclerotic heart disease of native coronary artery with unspecified angina pectoris: Secondary | ICD-10-CM | POA: Diagnosis not present

## 2017-01-02 DIAGNOSIS — Z01810 Encounter for preprocedural cardiovascular examination: Secondary | ICD-10-CM

## 2017-01-02 NOTE — Progress Notes (Deleted)
Cardiology Office Note    Date:  01/02/2017   ID:  [name], DOB 04-12-1970, MRN [id number]  PCP:  Javier Docker, MD  Cardiologist: Dr. Claiborne Billings   Chief Complaint  Patient presents with  . Follow-up    s/p stress test     History of Present Illness:    [name] is a 46 y.o. 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   She was last examined by Daune Perch, NP on 12/24/2016 for preoperative cardiac clearance in regards to upcoming breast cancer surgery with mastectomy and lymph node excision with sentinel lymph node biopsy and right breast reconstruction. Reported at that time having left-sided chest pressure with minimal exertion such as washing dishes or vacuuming. An EKG was obtained and showed TWI along V2-V5. With her concerning symptoms, a STAT troponin was checked and negative. Imdur was increased to 60mg  daily and a stress test was ordered to assess for ischemia. This was performed on 12/30/2016 and was intermediate risk, showing a prior apical infarct and very mild peri-infarct ischemia; EF was estimated at 43% with apical akinesis. The study was intermediate risk due to reduced LV function.   In talking with the patient today, she reports improvement in her symptoms with the recent addition of Imdur. She describes the pain that she does experience as an aching sensation along her left breast which can occur at rest or with activity. This has been present for over a year according to her report. She denies any dyspnea, palpitations, orthopnea, PND, or lower extremity edema. She has stopped taking ASA and Plavix as she was unsure when her surgery would be rescheduled.  Past Medical  History:  Diagnosis Date  . Asthma   . Bell's palsy   . Cardiomegaly   . Coronary artery disease    NSTEMI 08/2015 (65% LAD; D1 50%)  . Family history of breast cancer   . Family history of prostate cancer   . GERD (gastroesophageal reflux disease)   . Hypertension   . 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 OSA (obstructive sleep apnea)    negative results  . Renal artery stenosis (HCC)    s/p stent placment to left     Past Surgical History:  Procedure Laterality Date  . CARDIAC CATHETERIZATION N/A 09/18/2015   Procedure: Left Heart Cath and Coronary Angiography;  Surgeon: Leonie Man, MD;  Location: [facility];  Service: Cardiovascular;  Laterality: N/A;  . [place]   x2  . IR CV LINE INJECTION  07/24/2016  . PERIPHERAL VASCULAR CATHETERIZATION N/A 08/23/2014   Procedure: Renal Angiography;  Surgeon: Adrian Prows, MD;  Location: [facility];  Service: Cardiovascular;  Laterality: N/A;  . RENAL ARTERY STENT Left 08/23/2014  . SKIN BIOPSY  2000  . TUBAL LIGATION      Current Medications: Outpatient Medications Prior to Visit  Medication Sig Dispense Refill  . acetaminophen (TYLENOL) 500 MG tablet Take 1,000 mg by mouth every 8 (eight) hours as needed for mild pain or headache.    . albuterol (PROVENTIL) (2.5 MG/3ML) 0.083% nebulizer solution Take 3 mLs (2.5 mg total) by nebulization every 6 (six) hours  as needed for wheezing or shortness of breath. 75 mL 12  . amLODipine (NORVASC) 10 MG tablet Take 10 mg by mouth daily.    Marland Kitchen aspirin 81 MG chewable tablet Chew 81 mg by mouth daily.    Marland Kitchen atorvastatin (LIPITOR) 80 MG tablet Take 1 tablet (80 mg total) by mouth daily at 6 PM. 30 tablet 0  . betamethasone valerate ointment (VALISONE) 0.1 % Apply 1 application topically 2 (two) times daily. 45 g 1  . isosorbide mononitrate (IMDUR) 60 MG 24 hr tablet Take 1 tablet (60 mg total) by mouth daily. 90 tablet 3  . lidocaine-prilocaine (EMLA) cream Apply  1 application topically as needed (for port).    . losartan (COZAAR) 50 MG tablet Take 50 mg by mouth daily.  3  . metoprolol succinate (TOPROL XL) 100 MG 24 hr tablet Take 1 tablet (100 mg total) by mouth daily. Take with or immediately following a meal. 30 tablet 0  . mometasone-formoterol (DULERA) 200-5 MCG/ACT AERO Inhale 2 puffs into the lungs 2 (two) times daily. 1 Inhaler 1  . nitroGLYCERIN (NITROSTAT) 0.4 MG SL tablet Place 0.4 mg under the tongue every 5 (five) minutes as needed for chest pain.    Marland Kitchen PROAIR HFA 108 (90 Base) MCG/ACT inhaler Inhale 2 puffs into the lungs every 6 (six) hours as needed for wheezing or shortness of breath.   3  . tamoxifen (NOLVADEX) 20 MG tablet Take 1 tablet (20 mg total) by mouth daily. 90 tablet 3  . venlafaxine XR (EFFEXOR-XR) 37.5 MG 24 hr capsule Take 1 capsule (37.5 mg total) by mouth daily with breakfast. 30 capsule 6   No facility-administered medications prior to visit.      Allergies:   Compazine [prochlorperazine] and Ondansetron hcl   Social History   Social History  . Marital status: Single    Spouse name: N/A  . Number of children: 4  . Years of education: 9   Occupational History  . Unemployed    Social History Main Topics  . Smoking status: Current Every Day Smoker    Packs/day: 0.50    Years: 15.00    Types: Cigarettes  . Smokeless tobacco: Never Used     Comment: 6 cig per day  . Alcohol use No  . Drug use: No  . Sexual activity: Yes    Birth control/ protection: None   Other Topics Concern  . None   Social History Narrative   Lives at home with fiance and kids   Caffeine use: 2 cups coffee per day   4 Dr. Malachi Bonds (12oz) per day      Family History:  The patient's family history includes Asthma in her father; Breast cancer in her maternal aunt; Cervical cancer in her mother; Coronary artery disease (age of onset: 68) in her father; Hypertension in her mother; Prostate cancer in her cousin.   Review of Systems:     Please see the history of present illness.     General:  No chills, fever, night sweats or weight changes.  Cardiovascular:  No dyspnea on exertion, edema, orthopnea, palpitations, paroxysmal nocturnal dyspnea. Positive for chest pain (improved).  Dermatological: No rash, lesions/masses Respiratory: No cough, dyspnea Urologic: No hematuria, dysuria Abdominal:   No nausea, vomiting, diarrhea, bright red blood per rectum, melena, or hematemesis Neurologic:  No visual changes, wkns, changes in mental status. All other systems reviewed and are otherwise negative except as noted above.   Physical Exam:  VS:  BP 124/72 (BP Location: Left Arm, Patient Position: Sitting, Cuff Size: Normal)   Pulse 72   Ht 5\' 3"  (1.6 m)   Wt 168 lb (76.2 kg)   BMI 29.76 kg/m    General: Well developed, well nourished [place] American female appearing in no acute distress. Head: Normocephalic, atraumatic, sclera non-icteric, no xanthomas, nares are without discharge.  Neck: No carotid bruits. JVD not elevated.  Lungs: Respirations regular and unlabored, without wheezes or rales.  Heart: Regular rate and rhythm. No S3 or S4.  No murmur, no rubs, or gallops appreciated. Abdomen: Soft, non-tender, non-distended with normoactive bowel sounds. No hepatomegaly. No rebound/guarding. No obvious abdominal masses. Msk:  Strength and tone appear normal for age. No joint deformities or effusions. Extremities: No clubbing or cyanosis. No lower extremity edema.  Distal pedal pulses are 2+ bilaterally. Neuro: Alert and oriented X 3. Moves all extremities spontaneously. No focal deficits noted. Psych:  Responds to questions appropriately with a normal affect. Skin: No rashes or lesions noted  Wt Readings from Last 3 Encounters:  01/02/17 168 lb (76.2 kg)  01/01/17 168 lb 3.2 oz (76.3 kg)  12/30/16 170 lb (77.1 kg)     Studies/Labs Reviewed:   EKG:  EKG is not ordered today.   Recent Labs: 12/19/2016: ALT 13; BUN  7; Creatinine, Ser 0.73; Hemoglobin 14.4; Platelets 256; Potassium 4.0; Sodium 138   Lipid Panel    Component Value Date/Time   CHOL 183 09/18/2015 0340   TRIG 123 09/18/2015 0340   HDL 47 09/18/2015 0340   CHOLHDL 3.9 09/18/2015 0340   VLDL 25 09/18/2015 0340   LDLCALC 111 (H) 09/18/2015 0340    Additional studies/ records that were reviewed today include:   NST: 12/2016  Nuclear stress EF: 43%.  There was no ST segment deviation noted during stress.  Defect 1: There is a medium defect of severe severity present in the apical anterior, apical inferior and apex location.  This is an intermediate risk study.  The left ventricular ejection fraction is moderately decreased (30-44%).   Intermediate risk stress nuclear study with prior apical infarct and very mild peri-infarct ischemia; EF 43 with apical akinesis; study intermediate risk due to reduced LV function; note increased uptake right breast concerning for breast mass.  Cardiac Catheterization: 08/2015 1. Mid LAD to Dist LAD lesion, 65% stenosed. This is in a tortuous segment with several hinge points. Focal areas may be more significant, however the remainder the area looks relatively okay. 2. Mid LAD lesion, 45% stenosed. 3. Ost 1st Diag to 1st Diag lesion, 50% stenosed. 4. Known mild to moderately reduced EF by Echo today.   The patient had mild troponin elevation in the setting of hypertensive emergency/urgency and chest pain. Echo shows possible EF of roughly 45%. Final result pending.  I reviewed the images with Dr. Claiborne Billings, patient does have disease in the LAD, however this is a tortuous segment and there appears to be some spiraling of the vessel at this point. This would probably require relatively significant stent placement in the mid LAD. We discussed potential options, and Dr. Claiborne Billings felt it best to try to opt for medical management first.  Plan:  Return to nursing unit for continued medical management.  I  will restart heparin 8 hours with sheath removal based on non-STEMI.  Continue to treat blood pressure and titrate antianginals.  Cardiovascular risk factor modification with statin and glycemic control.  If she fails medical management, would consider PCI of  the distal targeting the LAD.  Assessment:    1. Preoperative cardiovascular examination   2. Coronary artery disease involving native coronary artery of native heart with angina pectoris ([place])   3. Essential (primary) hypertension   4. Hyperlipidemia LDL goal <70   5. Malignant neoplasm of overlapping sites of right breast in female, estrogen receptor positive ([place])      Plan:   In order of problems listed above:  1. Preoperative Cardiac Clearance for mastectomy and lymph node excision with sentinel lymph node biopsy and right breast reconstruction - the patient has a known cardiac history having a recent cath in 08/2015 showing a tortuous 65% LAD stenosis with medical management pursued at that time. Recent NST showed a prior apical infarct and very mild peri-infarct ischemia, overall being intermediate-risk due to EF reported at 43%. Discussed results of the stress test with Dr. Claiborne Billings, EF likely falsely low as common to occur on stress test. Can consider a repeat echo following surgery. With only mild peri-infarct ischemia, would not pursue further ischemic evaluation at this time. She is of acceptable risk to proceed with the above procedure. Will forward today's note to Dr. Brantley Stage and Dr. Lindi Adie. I informed the patient that if her surgery is going to be more than 2 weeks from now, then she should restart ASA and hold prior to surgery per Dr. Josetta Huddle recommendations.   2. CAD - known 65% LAD stenosis as above with recent NST showing mild ischemia.  - her symptoms improved with the addition of Imdur.  - continue ASA, Amlodipine, BB, Imdur, and statin therapy.  - will follow-up with Dr. Claiborne Billings after her surgery to reassess for  recurrent anginal symptoms.   3. HTN - BP is well-controlled at 124/72 during today's visit.  - continue current medication regimen.   4. HLD - Lipid Panel in 2017 showed LDL of 111. No repeat FLP available for review in Epic since. She is not fasting today but she have a FLP at her next visit.  - continue Atorvastatin 80mg  daily with goal LDL < 70 with known CAD.   5. Breast Cancer - followed by Dr. Brantley Stage and Dr. Lindi Adie with plans for an upcoming mastectomy as above.    Medication Adjustments/Labs and Tests Ordered: Current medicines are reviewed at length with the patient today.  Concerns regarding medicines are outlined above.  Medication changes, Labs and Tests ordered today are listed in the Patient Instructions below. Patient Instructions  Medication Instructions: Your physician recommends that you continue on your current medications as directed. Please refer to the Current Medication list given to you today.  Follow-Up: Your physician recommends that you schedule a follow-up appointment in: 3 months with Dr. Claiborne Billings.  Any Other Special Instructions are listed below:  You have been cleared for surgery!   Signed, Erma Heritage, PA-C  01/02/2017 1:59 PM    [facility] [place], [place], [place]  [address] Phone: [phone]; Fax: [phone]  [address], [place], [place] [address] Phone: [phone]

## 2017-01-02 NOTE — Telephone Encounter (Signed)
Per 10/10 no los at check out °

## 2017-01-02 NOTE — Patient Instructions (Signed)
Medication Instructions: Your physician recommends that you continue on your current medications as directed. Please refer to the Current Medication list given to you today.  Follow-Up: Your physician recommends that you schedule a follow-up appointment in: 3 months with Dr. Claiborne Billings.   Any Other Special Instructions are listed below:  You have been cleared for surgery!

## 2017-01-06 ENCOUNTER — Ambulatory Visit: Payer: Medicaid Other | Admitting: Student

## 2017-01-07 ENCOUNTER — Ambulatory Visit
Admission: RE | Admit: 2017-01-07 | Discharge: 2017-01-07 | Disposition: A | Discharge status: Home / Self Care | Payer: Medicaid Other | Source: Ambulatory Visit | Attending: Radiation Oncology | Admitting: Radiation Oncology

## 2017-01-07 ENCOUNTER — Ambulatory Visit: Payer: Self-pay

## 2017-01-15 ENCOUNTER — Telehealth: Payer: Self-pay | Admitting: Hematology and Oncology

## 2017-01-15 NOTE — Telephone Encounter (Signed)
Scheduled appt per 10/24 sch message - patient is aware of appt date and time.  

## 2017-01-22 NOTE — Pre-Procedure Instructions (Signed)
[name]  01/22/2017      [id number] [name], [place] [address] [place] [place] [address] Phone: [phone] Fax: [phone]    Your procedure is scheduled on November 8  Report to [facility] Admitting at 0800 A.M.  Call this number if you have problems the morning of surgery:  [phone]   Remember:  Do not eat food or drink liquids after midnight.  Continue all other medications as directed by your physician except follow these medication instructions before surgery   Take these medicines the morning of surgery with A SIP OF WATER  albuterol (PROVENTIL) (2.5 MG/3ML) amLODipine (NORVASC) isosorbide mononitrate (IMDUR) metoprolol succinate (TOPROL XL) mometasone-formoterol (DULERA) nitroGLYCERIN (NITROSTAT)  If needed PROAIR HFA 108  venlafaxine XR (EFFEXOR-XR)   7 days prior to surgery STOP taking any Aspirin (unless otherwise instructed by your surgeon), Aleve, Naproxen, Ibuprofen, Motrin, Advil, Goody's, BC's, all herbal medications, fish oil, and all vitamins    Do not wear jewelry, make-up or nail polish.  Do not wear lotions, powders, or perfumes, or deoderant.  Do not shave 48 hours prior to surgery.  Men may shave face and neck.  Do not bring valuables to the hospital.  [facility] is not responsible for any belongings or valuables.  Contacts, dentures or bridgework may not be worn into surgery.  Leave your suitcase in the car.  After surgery it may be brought to your room.  For patients admitted to the hospital, discharge time will be determined by your treatment team.  Patients discharged the day of surgery will not be allowed to drive home.    Special instructions:   [place]- Preparing For Surgery  Before surgery, you can play an important role. Because skin is not sterile, your skin needs to be as free of germs as possible. You can reduce the number of germs on your skin by washing  with CHG (chlorahexidine gluconate) Soap before surgery.  CHG is an antiseptic cleaner which kills germs and bonds with the skin to continue killing germs even after washing.  Please do not use if you have an allergy to CHG or antibacterial soaps. If your skin becomes reddened/irritated stop using the CHG.  Do not shave (including legs and underarms) for at least 48 hours prior to first CHG shower. It is OK to shave your face.  Please follow these instructions carefully.   1. Shower the NIGHT BEFORE SURGERY and the MORNING OF SURGERY with CHG.   2. If you chose to wash your hair, wash your hair first as usual with your normal shampoo.  3. After you shampoo, rinse your hair and body thoroughly to remove the shampoo.  4. Use CHG as you would any other liquid soap. You can apply CHG directly to the skin and wash gently with a scrungie or a clean washcloth.   5. Apply the CHG Soap to your body ONLY FROM THE NECK DOWN.  Do not use on open wounds or open sores. Avoid contact with your eyes, ears, mouth and genitals (private parts). Wash Face and genitals (private parts)  with your normal soap.  6. Wash thoroughly, paying special attention to the area where your surgery will be performed.  7. Thoroughly rinse your body with warm water from the neck down.  8. DO NOT shower/wash with your normal soap after using and rinsing off the CHG Soap.  9. Pat yourself dry with a CLEAN TOWEL.  10. Wear CLEAN PAJAMAS to bed the night before surgery, wear comfortable clothes the morning of surgery  11. Place CLEAN SHEETS on your bed the night of your first shower and DO NOT SLEEP WITH PETS.    Day of Surgery: Do not apply any deodorants/lotions. Please wear clean clothes to the hospital/surgery center.      Please read over the following fact sheets that you were given.

## 2017-01-23 ENCOUNTER — Encounter (HOSPITAL_COMMUNITY)
Admission: RE | Admit: 2017-01-23 | Discharge: 2017-01-23 | Disposition: A | Discharge status: Home / Self Care | Payer: Medicaid Other | Source: Ambulatory Visit | Attending: Surgery | Admitting: Surgery

## 2017-01-23 ENCOUNTER — Encounter (HOSPITAL_COMMUNITY): Payer: Self-pay

## 2017-01-23 DIAGNOSIS — Z170 Estrogen receptor positive status [ER+]: Secondary | ICD-10-CM | POA: Insufficient documentation

## 2017-01-23 DIAGNOSIS — C50919 Malignant neoplasm of unspecified site of unspecified female breast: Secondary | ICD-10-CM

## 2017-01-23 DIAGNOSIS — C50811 Malignant neoplasm of overlapping sites of right female breast: Secondary | ICD-10-CM | POA: Insufficient documentation

## 2017-01-23 HISTORY — DX: Malignant neoplasm of unspecified site of unspecified female breast: C50.919

## 2017-01-23 LAB — CBC
HEMATOCRIT: 42.1 % (ref 36.0–46.0)
HEMOGLOBIN: 14.3 g/dL (ref 12.0–15.0)
MCH: 30.6 pg (ref 26.0–34.0)
MCHC: 34 g/dL (ref 30.0–36.0)
MCV: 90 fL (ref 78.0–100.0)
Platelets: 247 10*3/uL (ref 150–400)
RBC: 4.68 MIL/uL (ref 3.87–5.11)
RDW: 12.8 % (ref 11.5–15.5)
WBC: 11.7 10*3/uL — ABNORMAL HIGH (ref 4.0–10.5)

## 2017-01-23 LAB — BASIC METABOLIC PANEL
Anion gap: 10 (ref 5–15)
BUN: 5 mg/dL — AB (ref 6–20)
CHLORIDE: 107 mmol/L (ref 101–111)
CO2: 23 mmol/L (ref 22–32)
CREATININE: 0.71 mg/dL (ref 0.44–1.00)
Calcium: 9.4 mg/dL (ref 8.9–10.3)
GFR calc Af Amer: >60 mL/min (ref >60)
GFR calc non Af Amer: >60 mL/min (ref >60)
Glucose, Bld: 97 mg/dL (ref 65–99)
Potassium: 3.4 mmol/L — ABNORMAL LOW (ref 3.5–5.1)
Sodium: 140 mmol/L (ref 135–145)

## 2017-01-23 NOTE — Progress Notes (Signed)
PCP is Dr. Alfonso Patten. [place]  LOV 04/2016 Cardio is Dr. Claiborne Billings  [place] 2018.   Pre-op clearance note in epic 01/02/2017 Oncologist is Dr. Lindi Adie  LOV 12/2016 Renal stent was placed by Dr. Christen Butter in 2016 Has history of Bell's Palsy in 2001, and right side of face is still alittle numb.   Was dx with lymphoma back in 2002  Had Chemo then, and again this yr.  Is expecting to have radiation after this upcoming surgery.. Tested for OSA in [place]  -  Negative results. Has been off her plavix and aspirin for approx a month now.

## 2017-01-23 NOTE — Pre-Procedure Instructions (Signed)
[name]  01/23/2017      [id number] [name], [place] [address] Phone: [phone] Fax: [phone]    Your procedure is scheduled on Thursday, November 8th   Report to [facility] Admitting at 0800 A.M.             (posted surgery time 10am - 1:00pm)   Call this number if you have problems the MORNING of surgery:  [phone]   Remember:   Do not eat food or drink liquids after midnight.  Continue all other medications as directed by your physician except follow these medication instructions before surgery   Take these medicines the morning of surgery with A SIP OF WATER :  albuterol (PROVENTIL) (2.5 MG/3ML) amLODipine (NORVASC) isosorbide mononitrate (IMDUR) metoprolol succinate (TOPROL XL) mometasone-formoterol (DULERA) nitroGLYCERIN (NITROSTAT)  If needed PROAIR HFA 108  venlafaxine XR (EFFEXOR-XR)   7 days prior to surgery STOP taking any Aspirin (unless otherwise instructed by your surgeon), Aleve, Naproxen, Ibuprofen, Motrin, Advil, Goody's, BC's, all herbal medications, fish oil, and all vitamins    Do not wear jewelry, make-up or nail polish.  Do not wear lotions, powders,  perfumes, or deoderant.  Do not shave 48 hours prior to surgery.    Do not bring valuables to the hospital.  [facility] is not responsible for any belongings or valuables.  Contacts, dentures or bridgework may not be worn into surgery.  Leave your suitcase in the car.  After surgery it may be brought to your room.  For patients admitted to the hospital, discharge time will be determined by your treatment team.     Special instructions:   [place]- Preparing For Surgery  Before surgery, you can play an important role. Because skin is not sterile, your skin needs to be as free of germs as possible. You can reduce the number of germs on your skin by washing with CHG (chlorahexidine gluconate)  Soap before surgery.  CHG is an antiseptic cleaner which kills germs and bonds with the skin to continue killing germs even after washing.  Please do not use if you have an allergy to CHG or antibacterial soaps. If your skin becomes reddened/irritated stop using the CHG.  Do not shave (including legs and underarms) for at least 48 hours prior to first CHG shower. It is OK to shave your face.  Please follow these instructions carefully.   1. Shower the NIGHT BEFORE SURGERY and the MORNING OF SURGERY with CHG.   2. If you chose to wash your hair, wash your hair first as usual with your normal shampoo.  3. After you shampoo, rinse your hair and body thoroughly to remove the shampoo.  4. Use CHG as you would any other liquid soap. You can apply CHG directly to the skin and wash gently with a scrungie or a clean washcloth.   5. Apply the CHG Soap to your body ONLY FROM THE NECK DOWN.  Do not use on open wounds or open sores. Avoid contact with your eyes, ears, mouth and genitals (private parts). Wash Face and genitals (private parts)  with your normal soap.  6. Wash thoroughly, paying special attention to the area where your surgery will be performed.  7. Thoroughly rinse your body with warm water from the neck down.  8. DO NOT shower/wash with your normal soap after using and rinsing off the CHG Soap.  9. Pat yourself dry  with a CLEAN TOWEL.  10. Wear CLEAN PAJAMAS to bed the night before surgery, wear comfortable clothes the morning of surgery  11. Place CLEAN SHEETS on your bed the night of your first shower and DO NOT SLEEP WITH PETS.    Day of Surgery: Do not apply any deodorants/lotions. Please wear clean clothes to the hospital/surgery center.      Please read over the following fact sheets that you were given.

## 2017-01-24 NOTE — Progress Notes (Signed)
Anesthesia Chart Review:   Pt is a 46 year old female scheduled for R simple mastectomy with radioactive seed targeted lymph node excision and right axillary sentinel lymph node biopsy, immediate right breast reconstruction with placement of tissue expander and Flex HD on 01/30/2017 with Erroll Luna, M.D. and Audelia Hives, D.O.  Surgery was originally scheduled for 12/25/16 but was postponed in order for pt to be evaluated by cardiology. Stress test ordered, results below. Pt now clear by cardiology to proceed.   - PCP is Lillia Corporal, MD - Cardiologist is Shelva Majestic, MD, last office visit 01/02/17 with Bernerd Pho, [place] who cleared pt for surgery.   PMH includes: CAD (NSTEMI 08/2015), renal artery stenosis (s/p stent 08/23/14), HTN OSA, asthma, lymphoma, asthma, GERD. Current smoker. BMI 30.   Medications include: Albuterol, amlodipine, ASA 81 mg, Lipitor, Imdur, losartan, metoprolol, Dulera, albuterol.   Preoperative labs reviewed.    CXR 04/12/16: No acute finding. Chronic bronchitic markings.  EKG 12/24/16: NSR. Rightward axis. Cannot rule out inferior infarct, age undetermined. Anterior infarct, age undetermined. T-wave abnormality, consider lateral ischemia.  Nuclear stress test 12/30/16:   Nuclear stress EF: 43%.  There was no ST segment deviation noted during stress.  Defect 1: There is a medium defect of severe severity present in the apical anterior, apical inferior and apex location.  This is an intermediate risk study.  The left ventricular ejection fraction is moderately decreased (30-44%). - Intermediate risk stress nuclear study with prior apical infarct and very mild peri-infarct ischemia; EF 43 with apical akinesis; study intermediate risk due to reduced LV function; note increased uptake right breast concerning for breast mass. - Dr. Claiborne Billings reviewed results. Per [place] Strader's note 01/02/17 "EF likely falsely low as common to occur on stress test.  Can consider a repeat echo following surgery. With only mild peri-infarct ischemia, would not pursue further ischemic evaluation at this time. She is of acceptable risk to proceed with the above procedure."  Cardiac cath 09/18/15: 1. Mid LAD to Dist LAD lesion, 65% stenosed. This is in a tortuous segment with several hinge points. Focal areas may be more significant, however the remainder the area looks relatively okay. 2. Mid LAD lesion, 45% stenosed. 3. Ost 1st Diag to 1st Diag lesion, 50% stenosed. 4. Known mild to moderately reduced EF by Echo today.  Echo 09/18/15:  - Left ventricle: The cavity size was normal. There was moderate concentric hypertrophy. Systolic function was normal. The estimated ejection fraction was 55%. Wall motion was normal; there were no regional wall motion abnormalities. The study is not technically sufficient to allow evaluation of LV diastolic function. - Aortic valve: Trileaflet; normal thickness leaflets. Transvalvular velocity was within the normal range. There was no stenosis. There was no regurgitation. - Aortic root: The aortic root was normal in size. - Mitral valve: There was moderate regurgitation directed centrally. Valve area by pressure half-time: 1.8 cm^2. - Right ventricle: Systolic function was normal. - Tricuspid valve: There was mild regurgitation. - Pulmonary arteries: Systolic pressure was mildly increased. PA peak pressure: 37 mm Hg (S). - Inferior vena cava: The vessel was normal in size. - Pericardium, extracardiac: There was no pericardial effusion.  If no changes, I anticipate pt can proceed with surgery as scheduled.   Willeen Cass, FNP-BC [facility]/Anesthesiology Phone: [phone] 01/24/2017 1:46 PM

## 2017-01-28 ENCOUNTER — Ambulatory Visit
Admission: RE | Admit: 2017-01-28 | Discharge: 2017-01-28 | Disposition: A | Discharge status: Home / Self Care | Payer: Medicaid Other | Source: Ambulatory Visit | Attending: Surgery | Admitting: Surgery

## 2017-01-28 ENCOUNTER — Other Ambulatory Visit: Payer: Self-pay | Admitting: Surgery

## 2017-01-28 ENCOUNTER — Ambulatory Visit
Admission: RE | Admit: 2017-01-28 | Discharge: 2017-01-28 | Disposition: A | Discharge status: Home / Self Care | Payer: Self-pay | Source: Ambulatory Visit | Attending: Surgery | Admitting: Surgery

## 2017-01-28 ENCOUNTER — Inpatient Hospital Stay
Admission: RE | Admit: 2017-01-28 | Discharge: 2017-01-28 | Disposition: A | Discharge status: Home / Self Care | Payer: Medicaid Other | Source: Ambulatory Visit | Attending: Surgery | Admitting: Surgery

## 2017-01-28 DIAGNOSIS — C50911 Malignant neoplasm of unspecified site of right female breast: Secondary | ICD-10-CM

## 2017-01-28 DIAGNOSIS — C779 Secondary and unspecified malignant neoplasm of lymph node, unspecified: Principal | ICD-10-CM

## 2017-01-29 ENCOUNTER — Other Ambulatory Visit: Payer: Medicaid Other

## 2017-01-30 ENCOUNTER — Ambulatory Visit
Admission: RE | Admit: 2017-01-30 | Discharge: 2017-01-30 | Disposition: A | Discharge status: Home / Self Care | Payer: Medicaid Other | Source: Ambulatory Visit | Attending: Surgery | Admitting: Surgery

## 2017-01-30 ENCOUNTER — Encounter (HOSPITAL_COMMUNITY)
Admission: RE | Disposition: A | Discharge status: Home / Self Care | Payer: Self-pay | Source: Ambulatory Visit | Attending: Plastic Surgery

## 2017-01-30 ENCOUNTER — Ambulatory Visit (HOSPITAL_COMMUNITY): Payer: Medicaid Other | Admitting: Emergency Medicine

## 2017-01-30 ENCOUNTER — Encounter (HOSPITAL_COMMUNITY): Payer: Self-pay | Admitting: *Deleted

## 2017-01-30 ENCOUNTER — Ambulatory Visit (HOSPITAL_COMMUNITY): Payer: Medicaid Other | Admitting: Anesthesiology

## 2017-01-30 ENCOUNTER — Observation Stay (HOSPITAL_COMMUNITY)
Admission: RE | Admit: 2017-01-30 | Discharge: 2017-01-31 | Disposition: A | Discharge status: Home / Self Care | Payer: Medicaid Other | Source: Ambulatory Visit | Attending: Plastic Surgery | Admitting: Plastic Surgery

## 2017-01-30 ENCOUNTER — Other Ambulatory Visit: Payer: Self-pay

## 2017-01-30 ENCOUNTER — Ambulatory Visit (HOSPITAL_COMMUNITY)
Admission: RE | Admit: 2017-01-30 | Discharge: 2017-01-30 | Disposition: A | Discharge status: Home / Self Care | Payer: Medicaid Other | Source: Ambulatory Visit | Attending: Surgery | Admitting: Surgery

## 2017-01-30 DIAGNOSIS — F17200 Nicotine dependence, unspecified, uncomplicated: Secondary | ICD-10-CM | POA: Insufficient documentation

## 2017-01-30 DIAGNOSIS — C773 Secondary and unspecified malignant neoplasm of axilla and upper limb lymph nodes: Secondary | ICD-10-CM | POA: Insufficient documentation

## 2017-01-30 DIAGNOSIS — I10 Essential (primary) hypertension: Secondary | ICD-10-CM | POA: Insufficient documentation

## 2017-01-30 DIAGNOSIS — I2510 Atherosclerotic heart disease of native coronary artery without angina pectoris: Secondary | ICD-10-CM | POA: Insufficient documentation

## 2017-01-30 DIAGNOSIS — Z79899 Other long term (current) drug therapy: Secondary | ICD-10-CM | POA: Diagnosis not present

## 2017-01-30 DIAGNOSIS — C779 Secondary and unspecified malignant neoplasm of lymph node, unspecified: Principal | ICD-10-CM

## 2017-01-30 DIAGNOSIS — I252 Old myocardial infarction: Secondary | ICD-10-CM | POA: Insufficient documentation

## 2017-01-30 DIAGNOSIS — Z7902 Long term (current) use of antithrombotics/antiplatelets: Secondary | ICD-10-CM | POA: Insufficient documentation

## 2017-01-30 DIAGNOSIS — C50911 Malignant neoplasm of unspecified site of right female breast: Secondary | ICD-10-CM

## 2017-01-30 DIAGNOSIS — G4730 Sleep apnea, unspecified: Secondary | ICD-10-CM | POA: Insufficient documentation

## 2017-01-30 DIAGNOSIS — Z7982 Long term (current) use of aspirin: Secondary | ICD-10-CM | POA: Insufficient documentation

## 2017-01-30 DIAGNOSIS — Z170 Estrogen receptor positive status [ER+]: Secondary | ICD-10-CM | POA: Diagnosis not present

## 2017-01-30 DIAGNOSIS — Z9221 Personal history of antineoplastic chemotherapy: Secondary | ICD-10-CM | POA: Diagnosis not present

## 2017-01-30 DIAGNOSIS — Z7951 Long term (current) use of inhaled steroids: Secondary | ICD-10-CM | POA: Insufficient documentation

## 2017-01-30 DIAGNOSIS — I739 Peripheral vascular disease, unspecified: Secondary | ICD-10-CM | POA: Insufficient documentation

## 2017-01-30 HISTORY — PX: BREAST RECONSTRUCTION WITH PLACEMENT OF TISSUE EXPANDER AND FLEX HD (ACELLULAR HYDRATED DERMIS): SHX6295

## 2017-01-30 HISTORY — PX: SIMPLE MASTECTOMY: SHX2409

## 2017-01-30 HISTORY — DX: Malignant neoplasm of unspecified site of unspecified female breast: C50.919

## 2017-01-30 HISTORY — PX: MASTECTOMY WITH RADIOACTIVE SEED GUIDED EXCISION AND AXILLARY SENTINEL LYMPH NODE BIOPSY: SHX6736

## 2017-01-30 SURGERY — MASTECTOMY WITH RADIOACTIVE SEED GUIDED EXCISION AND AXILLARY SENTINEL LYMPH NODE BIOPSY
Anesthesia: General | Site: Breast | Laterality: Right

## 2017-01-30 MED ORDER — PROPOFOL 10 MG/ML IV BOLUS
INTRAVENOUS | Status: AC
  Filled 2017-01-30: qty 20

## 2017-01-30 MED ORDER — BUPIVACAINE-EPINEPHRINE (PF) 0.5% -1:200000 IJ SOLN
INTRAMUSCULAR | Status: DC | PRN
  Administered 2017-01-30: 30 mL

## 2017-01-30 MED ORDER — ASPIRIN 81 MG PO CHEW
81.0000 mg | CHEWABLE_TABLET | Freq: Every day | ORAL | Status: DC
  Administered 2017-01-31: 81 mg via ORAL
  Filled 2017-01-30: qty 1

## 2017-01-30 MED ORDER — VENLAFAXINE HCL ER 37.5 MG PO CP24
37.5000 mg | ORAL_CAPSULE | Freq: Every day | ORAL | Status: DC
  Administered 2017-01-31: 37.5 mg via ORAL
  Filled 2017-01-30: qty 1

## 2017-01-30 MED ORDER — LOSARTAN POTASSIUM 50 MG PO TABS
50.0000 mg | ORAL_TABLET | Freq: Every day | ORAL | Status: DC
  Administered 2017-01-31: 50 mg via ORAL
  Filled 2017-01-30: qty 1

## 2017-01-30 MED ORDER — HYDROMORPHONE HCL 1 MG/ML IJ SOLN
0.2500 mg | INTRAMUSCULAR | Status: DC | PRN
  Administered 2017-01-30: 0.5 mg via INTRAVENOUS

## 2017-01-30 MED ORDER — HYDROMORPHONE HCL 1 MG/ML IJ SOLN
0.2500 mg | INTRAMUSCULAR | Status: DC | PRN
  Administered 2017-01-30 (×2): 0.5 mg via INTRAVENOUS

## 2017-01-30 MED ORDER — NAPROXEN 250 MG PO TABS
500.0000 mg | ORAL_TABLET | Freq: Two times a day (BID) | ORAL | Status: DC | PRN
  Administered 2017-01-30: 500 mg via ORAL
  Filled 2017-01-30: qty 2

## 2017-01-30 MED ORDER — ROCURONIUM BROMIDE 10 MG/ML (PF) SYRINGE
PREFILLED_SYRINGE | INTRAVENOUS | Status: DC | PRN
  Administered 2017-01-30: 50 mg via INTRAVENOUS
  Administered 2017-01-30 (×2): 25 mg via INTRAVENOUS

## 2017-01-30 MED ORDER — SODIUM CHLORIDE 0.9 % IJ SOLN
INTRAVENOUS | Status: DC | PRN
  Administered 2017-01-30: 5 mL

## 2017-01-30 MED ORDER — OXYCODONE HCL 5 MG PO TABS: 5.0000 mg | ORAL_TABLET | Freq: Once | ORAL | Status: DC | PRN

## 2017-01-30 MED ORDER — DIAZEPAM 2 MG PO TABS: 2.0000 mg | ORAL_TABLET | Freq: Three times a day (TID) | ORAL | Status: DC | PRN

## 2017-01-30 MED ORDER — SODIUM CHLORIDE 0.9 % IJ SOLN
INTRAMUSCULAR | Status: AC
  Filled 2017-01-30: qty 10

## 2017-01-30 MED ORDER — PROPOFOL 10 MG/ML IV BOLUS
INTRAVENOUS | Status: DC | PRN
  Administered 2017-01-30: 150 mg via INTRAVENOUS

## 2017-01-30 MED ORDER — GABAPENTIN 300 MG PO CAPS
300.0000 mg | ORAL_CAPSULE | Freq: Once | ORAL | Status: AC
  Administered 2017-01-30: 300 mg via ORAL
  Filled 2017-01-30: qty 1

## 2017-01-30 MED ORDER — HYDROMORPHONE HCL 1 MG/ML IJ SOLN
1.0000 mg | INTRAMUSCULAR | Status: DC | PRN
  Administered 2017-01-31: 1 mg via INTRAVENOUS
  Filled 2017-01-30: qty 1

## 2017-01-30 MED ORDER — LIDOCAINE 2% (20 MG/ML) 5 ML SYRINGE
INTRAMUSCULAR | Status: DC | PRN
  Administered 2017-01-30: 40 mg via INTRAVENOUS

## 2017-01-30 MED ORDER — DEXAMETHASONE SODIUM PHOSPHATE 10 MG/ML IJ SOLN
INTRAMUSCULAR | Status: DC | PRN
  Administered 2017-01-30: 10 mg via INTRAVENOUS

## 2017-01-30 MED ORDER — DIPHENHYDRAMINE HCL 50 MG/ML IJ SOLN: 12.5000 mg | Freq: Four times a day (QID) | INTRAMUSCULAR | Status: DC | PRN

## 2017-01-30 MED ORDER — MOMETASONE FURO-FORMOTEROL FUM 200-5 MCG/ACT IN AERO
2.0000 | INHALATION_SPRAY | Freq: Two times a day (BID) | RESPIRATORY_TRACT | Status: DC
  Administered 2017-01-30 – 2017-01-31 (×2): 2 via RESPIRATORY_TRACT
  Filled 2017-01-30: qty 8.8

## 2017-01-30 MED ORDER — METOPROLOL SUCCINATE ER 100 MG PO TB24
100.0000 mg | ORAL_TABLET | Freq: Every day | ORAL | Status: DC
  Administered 2017-01-31: 100 mg via ORAL
  Filled 2017-01-30: qty 1

## 2017-01-30 MED ORDER — SENNA 8.6 MG PO TABS
1.0000 | ORAL_TABLET | Freq: Two times a day (BID) | ORAL | Status: DC
  Administered 2017-01-30 – 2017-01-31 (×2): 8.6 mg via ORAL
  Filled 2017-01-30 (×2): qty 1

## 2017-01-30 MED ORDER — PHENYLEPHRINE 40 MCG/ML (10ML) SYRINGE FOR IV PUSH (FOR BLOOD PRESSURE SUPPORT)
PREFILLED_SYRINGE | INTRAVENOUS | Status: DC | PRN
  Administered 2017-01-30: 80 ug via INTRAVENOUS
  Administered 2017-01-30: 40 ug via INTRAVENOUS
  Administered 2017-01-30: 80 ug via INTRAVENOUS
  Administered 2017-01-30: 60 ug via INTRAVENOUS
  Administered 2017-01-30: 40 ug via INTRAVENOUS

## 2017-01-30 MED ORDER — BISACODYL 10 MG RE SUPP: 10.0000 mg | Freq: Every day | RECTAL | Status: DC | PRN

## 2017-01-30 MED ORDER — 0.9 % SODIUM CHLORIDE (POUR BTL) OPTIME
TOPICAL | Status: DC | PRN
  Administered 2017-01-30 (×2): 1000 mL

## 2017-01-30 MED ORDER — METOCLOPRAMIDE HCL 5 MG/ML IJ SOLN
10.0000 mg | Freq: Four times a day (QID) | INTRAMUSCULAR | Status: DC
  Administered 2017-01-30 – 2017-01-31 (×4): 10 mg via INTRAVENOUS
  Filled 2017-01-30 (×4): qty 2

## 2017-01-30 MED ORDER — ISOSORBIDE MONONITRATE ER 60 MG PO TB24
60.0000 mg | ORAL_TABLET | Freq: Every day | ORAL | Status: DC
  Administered 2017-01-31: 60 mg via ORAL
  Filled 2017-01-30: qty 1

## 2017-01-30 MED ORDER — ATORVASTATIN CALCIUM 80 MG PO TABS: 80.0000 mg | ORAL_TABLET | Freq: Every day | ORAL | Status: DC

## 2017-01-30 MED ORDER — ALBUTEROL SULFATE HFA 108 (90 BASE) MCG/ACT IN AERS: 2.0000 | INHALATION_SPRAY | Freq: Four times a day (QID) | RESPIRATORY_TRACT | Status: DC | PRN

## 2017-01-30 MED ORDER — METHYLENE BLUE 0.5 % INJ SOLN
INTRAVENOUS | Status: AC
  Filled 2017-01-30: qty 10

## 2017-01-30 MED ORDER — HYDROCODONE-ACETAMINOPHEN 5-325 MG PO TABS
ORAL_TABLET | ORAL | Status: AC
  Administered 2017-01-30: 2 via ORAL
  Filled 2017-01-30: qty 2

## 2017-01-30 MED ORDER — CELECOXIB 200 MG PO CAPS
400.0000 mg | ORAL_CAPSULE | Freq: Once | ORAL | Status: AC
  Administered 2017-01-30: 400 mg via ORAL
  Filled 2017-01-30: qty 2

## 2017-01-30 MED ORDER — AMLODIPINE BESYLATE 10 MG PO TABS
10.0000 mg | ORAL_TABLET | Freq: Every day | ORAL | Status: DC
  Administered 2017-01-31: 10 mg via ORAL
  Filled 2017-01-30: qty 1

## 2017-01-30 MED ORDER — CEFAZOLIN SODIUM-DEXTROSE 2-4 GM/100ML-% IV SOLN
2.0000 g | Freq: Once | INTRAVENOUS | Status: AC
  Administered 2017-01-30: 2 g via INTRAVENOUS
  Filled 2017-01-30: qty 100

## 2017-01-30 MED ORDER — HYDROCODONE-ACETAMINOPHEN 5-325 MG PO TABS
1.0000 | ORAL_TABLET | ORAL | Status: DC | PRN
  Administered 2017-01-30: 2 via ORAL
  Filled 2017-01-30: qty 2

## 2017-01-30 MED ORDER — HYDROMORPHONE HCL 1 MG/ML IJ SOLN
INTRAMUSCULAR | Status: AC
  Administered 2017-01-30: 0.5 mg via INTRAVENOUS
  Filled 2017-01-30: qty 1

## 2017-01-30 MED ORDER — FENTANYL CITRATE (PF) 100 MCG/2ML IJ SOLN
INTRAMUSCULAR | Status: AC
  Filled 2017-01-30: qty 2

## 2017-01-30 MED ORDER — CEFAZOLIN SODIUM-DEXTROSE 2-4 GM/100ML-% IV SOLN
2.0000 g | Freq: Three times a day (TID) | INTRAVENOUS | Status: DC
  Administered 2017-01-30 – 2017-01-31 (×3): 2 g via INTRAVENOUS
  Filled 2017-01-30 (×4): qty 100

## 2017-01-30 MED ORDER — POLYETHYLENE GLYCOL 3350 17 G PO PACK: 17.0000 g | PACK | Freq: Every day | ORAL | Status: DC | PRN

## 2017-01-30 MED ORDER — MIDAZOLAM HCL 5 MG/ML IJ SOLN: 2.0000 mg | Freq: Once | INTRAMUSCULAR | Status: DC

## 2017-01-30 MED ORDER — NITROGLYCERIN 0.4 MG SL SUBL: 0.4000 mg | SUBLINGUAL_TABLET | SUBLINGUAL | Status: DC | PRN

## 2017-01-30 MED ORDER — KCL IN DEXTROSE-NACL 20-5-0.45 MEQ/L-%-% IV SOLN
INTRAVENOUS | Status: DC
  Administered 2017-01-30 – 2017-01-31 (×2): via INTRAVENOUS
  Filled 2017-01-30 (×2): qty 1000

## 2017-01-30 MED ORDER — OXYCODONE HCL 5 MG/5ML PO SOLN: 5.0000 mg | Freq: Once | ORAL | Status: DC | PRN

## 2017-01-30 MED ORDER — FENTANYL CITRATE (PF) 250 MCG/5ML IJ SOLN
INTRAMUSCULAR | Status: DC | PRN
  Administered 2017-01-30: 50 ug via INTRAVENOUS
  Administered 2017-01-30: 100 ug via INTRAVENOUS
  Administered 2017-01-30: 50 ug via INTRAVENOUS

## 2017-01-30 MED ORDER — OXYCODONE HCL 5 MG/5ML PO SOLN
5.0000 mg | Freq: Once | ORAL | Status: DC | PRN
Start: 2017-01-30 — End: 2017-01-30

## 2017-01-30 MED ORDER — ALBUTEROL SULFATE (2.5 MG/3ML) 0.083% IN NEBU: 2.5000 mg | INHALATION_SOLUTION | Freq: Four times a day (QID) | RESPIRATORY_TRACT | Status: DC | PRN

## 2017-01-30 MED ORDER — FENTANYL CITRATE (PF) 100 MCG/2ML IJ SOLN
50.0000 ug | Freq: Once | INTRAMUSCULAR | Status: AC
  Administered 2017-01-30: 50 ug via INTRAVENOUS

## 2017-01-30 MED ORDER — MIDAZOLAM HCL 2 MG/2ML IJ SOLN
0.5000 mg | Freq: Once | INTRAMUSCULAR | Status: AC
  Administered 2017-01-30: 0.5 mg via INTRAVENOUS

## 2017-01-30 MED ORDER — LACTATED RINGERS IV SOLN
INTRAVENOUS | Status: DC
  Administered 2017-01-30: 09:00:00 via INTRAVENOUS

## 2017-01-30 MED ORDER — PROMETHAZINE HCL 25 MG/ML IJ SOLN: 12.5000 mg | Freq: Once | INTRAMUSCULAR | Status: DC | PRN

## 2017-01-30 MED ORDER — MIDAZOLAM HCL 2 MG/2ML IJ SOLN
INTRAMUSCULAR | Status: AC
  Administered 2017-01-30: 1 mg
  Filled 2017-01-30: qty 2

## 2017-01-30 MED ORDER — LACTATED RINGERS IV SOLN
INTRAVENOUS | Status: DC | PRN
  Administered 2017-01-30 (×3): via INTRAVENOUS

## 2017-01-30 MED ORDER — SODIUM CHLORIDE 0.9 % IV SOLN
INTRAVENOUS | Status: DC | PRN
  Administered 2017-01-30: 500 mL

## 2017-01-30 MED ORDER — FENTANYL CITRATE (PF) 250 MCG/5ML IJ SOLN
INTRAMUSCULAR | Status: AC
  Filled 2017-01-30: qty 5

## 2017-01-30 MED ORDER — FENTANYL CITRATE (PF) 100 MCG/2ML IJ SOLN
25.0000 ug | Freq: Once | INTRAMUSCULAR | Status: AC
  Administered 2017-01-30: 25 ug via INTRAVENOUS

## 2017-01-30 MED ORDER — ACETAMINOPHEN 500 MG PO TABS
1000.0000 mg | ORAL_TABLET | Freq: Once | ORAL | Status: AC
  Administered 2017-01-30: 1000 mg via ORAL
  Filled 2017-01-30: qty 2

## 2017-01-30 MED ORDER — DIPHENHYDRAMINE HCL 12.5 MG/5ML PO ELIX: 12.5000 mg | ORAL_SOLUTION | Freq: Four times a day (QID) | ORAL | Status: DC | PRN

## 2017-01-30 MED ORDER — SUGAMMADEX SODIUM 200 MG/2ML IV SOLN
INTRAVENOUS | Status: DC | PRN
  Administered 2017-01-30: 200 mg via INTRAVENOUS

## 2017-01-30 SURGICAL SUPPLY — 69 items
APPLIER CLIP 9.375 MED OPEN (MISCELLANEOUS) ×3
BAG DECANTER FOR FLEXI CONT (MISCELLANEOUS) ×3 IMPLANT
BINDER BREAST LRG (GAUZE/BANDAGES/DRESSINGS) IMPLANT
BINDER BREAST XLRG (GAUZE/BANDAGES/DRESSINGS) ×3 IMPLANT
BIOPATCH RED 1 DISK 7.0 (GAUZE/BANDAGES/DRESSINGS) ×2 IMPLANT
BIOPATCH RED 1IN DISK 7.0MM (GAUZE/BANDAGES/DRESSINGS) ×1
CANISTER SUCT 3000ML PPV (MISCELLANEOUS) ×6 IMPLANT
CHLORAPREP W/TINT 26ML (MISCELLANEOUS) ×6 IMPLANT
CLIP APPLIE 9.375 MED OPEN (MISCELLANEOUS) ×1 IMPLANT
COVER SURGICAL LIGHT HANDLE (MISCELLANEOUS) ×3 IMPLANT
COVER TRANSDUCER ULTRASND GEL (DRAPE) ×3 IMPLANT
DERMABOND ADVANCED (GAUZE/BANDAGES/DRESSINGS) ×4
DERMABOND ADVANCED .7 DNX12 (GAUZE/BANDAGES/DRESSINGS) ×2 IMPLANT
DEVICE DUBIN SPECIMEN MAMMOGRA (MISCELLANEOUS) ×3 IMPLANT
DRAIN CHANNEL 19F RND (DRAIN) ×3 IMPLANT
DRAPE CHEST BREAST 15X10 FENES (DRAPES) ×3 IMPLANT
DRAPE HALF SHEET 40X57 (DRAPES) ×3 IMPLANT
DRAPE ORTHO SPLIT 77X108 STRL (DRAPES) ×4
DRAPE SURG 17X23 STRL (DRAPES) ×12 IMPLANT
DRAPE SURG ORHT 6 SPLT 77X108 (DRAPES) ×2 IMPLANT
DRAPE WARM FLUID 44X44 (DRAPE) ×3 IMPLANT
DRSG PAD ABDOMINAL 8X10 ST (GAUZE/BANDAGES/DRESSINGS) ×9 IMPLANT
ELECT BLADE 4.0 EZ CLEAN MEGAD (MISCELLANEOUS) ×3
ELECT CAUTERY BLADE 6.4 (BLADE) ×3 IMPLANT
ELECT REM PT RETURN 9FT ADLT (ELECTROSURGICAL) ×6
ELECTRODE BLDE 4.0 EZ CLN MEGD (MISCELLANEOUS) ×1 IMPLANT
ELECTRODE REM PT RTRN 9FT ADLT (ELECTROSURGICAL) ×2 IMPLANT
EVACUATOR SILICONE 100CC (DRAIN) ×3 IMPLANT
GAUZE SPONGE 4X4 12PLY STRL (GAUZE/BANDAGES/DRESSINGS) ×3 IMPLANT
GLOVE BIO SURGEON STRL SZ 6.5 (GLOVE) ×4 IMPLANT
GLOVE BIO SURGEON STRL SZ8 (GLOVE) ×6 IMPLANT
GLOVE BIO SURGEONS STRL SZ 6.5 (GLOVE) ×2
GLOVE BIOGEL PI IND STRL 8 (GLOVE) ×1 IMPLANT
GLOVE BIOGEL PI INDICATOR 8 (GLOVE) ×2
GOWN STRL REUS W/ TWL LRG LVL3 (GOWN DISPOSABLE) ×4 IMPLANT
GOWN STRL REUS W/ TWL XL LVL3 (GOWN DISPOSABLE) ×1 IMPLANT
GOWN STRL REUS W/TWL LRG LVL3 (GOWN DISPOSABLE) ×8
GOWN STRL REUS W/TWL XL LVL3 (GOWN DISPOSABLE) ×2
GRAFT FLEX HD 4X16 THICK (Tissue Mesh) ×3 IMPLANT
IMPL EXPANDER BREAST 375CC (Breast) ×1 IMPLANT
IMPLANT BREAST 375CC (Breast) ×2 IMPLANT
IMPLANT EXPANDER BREAST 375CC (Breast) ×1 IMPLANT
KIT BASIN OR (CUSTOM PROCEDURE TRAY) ×6 IMPLANT
KIT MARKER MARGIN INK (KITS) ×3 IMPLANT
KIT ROOM TURNOVER OR (KITS) ×6 IMPLANT
MARKER SKIN DUAL TIP RULER LAB (MISCELLANEOUS) ×3 IMPLANT
NEEDLE FILTER BLUNT 18X 1/2SAF (NEEDLE) ×2
NEEDLE FILTER BLUNT 18X1 1/2 (NEEDLE) ×1 IMPLANT
NEEDLE HYPO 25GX1X1/2 BEV (NEEDLE) ×3 IMPLANT
NS IRRIG 1000ML POUR BTL (IV SOLUTION) ×9 IMPLANT
PACK GENERAL/GYN (CUSTOM PROCEDURE TRAY) ×6 IMPLANT
PAD ABD 8X10 STRL (GAUZE/BANDAGES/DRESSINGS) ×6 IMPLANT
PAD ARMBOARD 7.5X6 YLW CONV (MISCELLANEOUS) ×6 IMPLANT
PIN SAFETY STERILE (MISCELLANEOUS) ×3 IMPLANT
SET ASEPTIC TRANSFER (MISCELLANEOUS) IMPLANT
SUT ETHILON 2 0 FS 18 (SUTURE) ×3 IMPLANT
SUT MNCRL AB 4-0 PS2 18 (SUTURE) ×9 IMPLANT
SUT MON AB 3-0 SH 27 (SUTURE) ×4
SUT MON AB 3-0 SH27 (SUTURE) ×2 IMPLANT
SUT MON AB 5-0 PS2 18 (SUTURE) ×6 IMPLANT
SUT PDS AB 2-0 CT1 27 (SUTURE) ×12 IMPLANT
SUT PDS AB 3-0 SH 27 (SUTURE) ×6 IMPLANT
SUT SILK 2 0 SH (SUTURE) IMPLANT
SUT SILK 4 0 PS 2 (SUTURE) ×3 IMPLANT
SUT VIC AB 3-0 SH 18 (SUTURE) ×3 IMPLANT
SYR CONTROL 10ML LL (SYRINGE) IMPLANT
TOWEL OR 17X24 6PK STRL BLUE (TOWEL DISPOSABLE) ×6 IMPLANT
TOWEL OR 17X26 10 PK STRL BLUE (TOWEL DISPOSABLE) ×6 IMPLANT
TRAY FOLEY W/METER SILVER 14FR (SET/KITS/TRAYS/PACK) ×3 IMPLANT

## 2017-01-30 NOTE — Interval H&P Note (Signed)
History and Physical Interval Note:  01/30/2017 9:25 AM  Elijio Miles  has presented today for surgery, with the diagnosis of RIGHT BREAST CANCER  The various methods of treatment have been discussed with the patient and family. After consideration of risks, benefits and other options for treatment, the patient has consented to  Procedure(s): RIGHT SIMPLE MASTECTOMY WITH  RADIOACTIVE SEED TARGETED RIGHT AXILLARY LYMPH NODE EXCISION AND RIGHT AXILLARY SENTINEL LYMPH NODE BIOPSY (Right) RIGHT BREAST RECONSTRUCTION WITH PLACEMENT OF TISSUE EXPANDER AND FLEX HD (ACELLULAR HYDRATED DERMIS) (Right) as a surgical intervention .  The patient's history has been reviewed, patient examined, no change in status, stable for surgery.  I have reviewed the patient's chart and labs.  Questions were answered to the patient's satisfaction.     Jazel Nimmons A.

## 2017-01-30 NOTE — Anesthesia Procedure Notes (Signed)
Procedure Name: Intubation Date/Time: 01/30/2017 10:03 AM Performed by: Teressa Lower., CRNA Pre-anesthesia Checklist: Patient identified, Emergency Drugs available, Suction available and Patient being monitored Patient Re-evaluated:Patient Re-evaluated prior to induction Oxygen Delivery Method: Circle system utilized Preoxygenation: Pre-oxygenation with 100% oxygen Induction Type: IV induction Ventilation: Mask ventilation without difficulty Laryngoscope Size: Mac and 3 Grade View: Grade I Tube type: Oral Tube size: 7.0 mm Number of attempts: 1 Airway Equipment and Method: Stylet Placement Confirmation: ETT inserted through vocal cords under direct vision,  positive ETCO2 and breath sounds checked- equal and bilateral Secured at: 21 cm Tube secured with: Tape Dental Injury: Teeth and Oropharynx as per pre-operative assessment

## 2017-01-30 NOTE — Anesthesia Procedure Notes (Addendum)
Anesthesia Regional Block: Pectoralis block   Pre-Anesthetic Checklist: ,, timeout performed, Correct Patient, Correct Site, Correct Laterality, Correct Procedure, Correct Position, site marked, Risks and benefits discussed,  Surgical consent,  Pre-op evaluation,  At surgeon's request and post-op pain management  Laterality: Right and Upper  Prep: chloraprep       Needles:   Needle Type: Echogenic Stimulator Needle     Needle Length: 9cm  Needle Gauge: 21   Needle insertion depth: 6 cm   Additional Needles:   Procedures:,,,, ultrasound used (permanent image in chart),,,,  Narrative:  Start time: 01/30/2017 9:10 AM End time: 01/30/2017 9:27 AM Injection made incrementally with aspirations every 5 mL.  Performed by: Personally  Anesthesiologist: Rica Koyanagi, MD  Additional Notes: Tolerated well

## 2017-01-30 NOTE — Op Note (Signed)
Op report    DATE OF OPERATION:  01/30/2017  LOCATION: [facility] Operating Room  SURGICAL DIVISION: Plastic Surgery  PREOPERATIVE DIAGNOSES:  1. Right Breast cancer.    POSTOPERATIVE DIAGNOSES:  1. Right Breast cancer.   PROCEDURE:  1. Right immediate breast reconstruction with placement of Acellular Dermal Matrix and tissue expanders.  SURGEON: Toivo Bordon Sanger Henery Betzold, DO  ASSISTANT: Shawn Rayburn, PA  ANESTHESIA:  General.   COMPLICATIONS: None.   IMPLANTS: Right - Mentor 375 cc. Ref #[id number].  Serial Number [id number];  100 cc saline placed Acellular Dermal Matrix 4 x 16 cm  INDICATIONS FOR PROCEDURE:  The patient, [name], is a 46 y.o. female born on February 29, 1971, is here for  immediate first stage breast reconstruction with placement of a right tissue expander and Acellular dermal matrix. MRN: [id number]  CONSENT:  Informed consent was obtained directly from the patient. Risks, benefits and alternatives were fully discussed. Specific risks including but not limited to bleeding, infection, hematoma, seroma, scarring, pain, implant infection, implant extrusion, capsular contracture, asymmetry, wound healing problems, and need for further surgery were all discussed. The patient did have an ample opportunity to have her questions answered to her satisfaction.   DESCRIPTION OF PROCEDURE:  The patient was taken to the operating room by the general surgery team. SCDs were placed and IV antibiotics were given. The patient's chest was prepped and draped in a sterile fashion. A time out was performed and the implants to be used were identified.  A right mastectomy was performed.  Once the general surgery team had completed their portion of the case the patient was rendered to the plastic and reconstructive surgery team.  Right:  The pectoralis major muscle was lifted from the chest wall with release of the lateral edge and lateral inframammary fold.  The pocket was  irrigated with antibiotic solution and hemostasis was achieved with electrocautery.  The ADM was then prepared according to the manufacture guidelines and slits placed to help with postoperative fluid management.  The ADM was then sutured to the inferior and lateral edge of the inframammary fold with 2-0 PDS starting with an interrupted stitch and then a running stitch. The pectoralis muscle at the inferior border was too superior to keep it all in place.  This was released and the ADM was used in its place.  The ADM was sutured to the inframammary fold and then the muscle.  The lateral portion was sutured to with interrupted sutures after the expander was placed.  The expander was prepared according to the manufacture guidelines, the air evacuated and then it was placed under the ADM and pectoralis major muscle.  The inferior and lateral tabs were used to secure the expander to the chest wall with 2-0 PDS.  The drain was placed at the inframammary fold over the ADM and secured to the skin with 3-0 Silk.    The deep layers were closed with 3-0 Monocryl followed by 4-0 Monocryl.  The skin was closed with 5-0 Monocryl and then dermabond was applied.  The ABDs and breast binder were placed.  The patient tolerated the procedure well and there were no complications.  The patient was allowed to wake from anesthesia and taken to the recovery room in satisfactory condition.

## 2017-01-30 NOTE — Op Note (Signed)
Preoperative diagnosis: Stage II right breast cancer  Postoperative diagnosis: Same  Procedure: Right simple mastectomy with targeted right axillary deep lymph node biopsy and right sentinel lymph node mapping with methylene blue dye  Surgeon: Erroll Luna MD  Anesthesia: General with regional block per anesthesia  EBL: 30 cc  Drains: Per plastic surgery note  Specimen: Right breast with right axillary node with a seed in place and clip.  This was verified by x-ray.  One blue sentinel node corresponding to the node with the seed in it.  2 non-sentinel nodes were sent which were less than 5 mm but firm  Indications for procedure: The patient presents for right simple mastectomy.  She is completed neoadjuvant chemotherapy with a modest response.  Sensory response was not as brisk as we would like we opted to proceed with right simple mastectomy.  We also discussed targeted right axillary sentinel lymph node mapping with her previous right action no being positive.  Risk, benefits and other options discussed.  She will undergo immediate reconstruction with plastic surgery at the same setting.  She was been counseled to stop smoking.  She is done with chemotherapy and is ready to proceed with surgery.The procedure has been discussed with the patient.  Alternative therapies have been discussed with the patient.  Operative risks include bleeding,  Infection,  Organ injury,  Nerve injury,  Blood vessel injury,  DVT,  Pulmonary embolism,  Death,  And possible reoperation.  Medical management risks include worsening of present situation.  The success of the procedure is 50 -90 % at treating patients symptoms.  The patient understands and agrees to proceed.  Description of procedure: The patient was met in the holding area.  The right breast was marked as the correct side.  Questions were answered.  She underwent injection with technetium sulfur colloid by nuclear medicine and had a pectoral block placed  by anesthesia.  She was then taken back to the operating room.  She was placed upon the OR table.  After induction of general anesthesia her upper chest region was prepped and draped in sterile fashion.  Timeout was done.  4 cc of methylene blue dye were injected into the right breast under the nipple areolar complex.  This was massaged for 5 minutes.  A curvilinear incision was made above and below the nipple areolar complex.  A superior skin flap was created to the clavicle.  The inferior skin flap was created to the inferior mammary fold.  The neoprobe was used and the hotspot with a see was located in the was marked in the right axilla.  The breast was then dissected off the chest wall and medial to lateral fashion.  Is passed off the field and oriented.  The neoprobe was used.  A level 2 node was identified which corresponded to the clip node.  This was excised this was also blue but not hot.  This was sent to the pathology lab after imaging showed the seating clip to be in the specimen.  The neoprobe was used and no other hot spots were identified.  There was a non-hot blue sentinel node was dissected out and sent separately.  2 non-sentinel nodes were sent since these were not enlarged but firm.  The remainder of her axilla was normal.  The axillary vein was identified and preserved.  The long thoracic nerve was identified and preserved.  The thoracodorsal trunk was identified and preserved.  Irrigation was used and hemostasis achieved.  At this  point the case all sponges were correct.  All instruments were correct.  Plastic surgery scrubbed in to complete the case.  Please see their note for details.  She was stable at this point.

## 2017-01-30 NOTE — Transfer of Care (Signed)
Immediate Anesthesia Transfer of Care Note  Patient: [name]  Procedure(s) Performed: RIGHT SIMPLE MASTECTOMY WITH  RADIOACTIVE SEED TARGETED RIGHT AXILLARY LYMPH NODE EXCISION AND RIGHT AXILLARY SENTINEL LYMPH NODE BIOPSY (Right Breast) RIGHT BREAST RECONSTRUCTION WITH PLACEMENT OF TISSUE EXPANDER AND FLEX HD (ACELLULAR HYDRATED DERMIS) (Right Breast)  Patient Location: PACU  Anesthesia Type:GA combined with regional for post-op pain  Level of Consciousness: awake, alert  and oriented  Airway & Oxygen Therapy: Patient Spontanous Breathing and Patient connected to face mask oxygen  Post-op Assessment: Report given to RN and Post -op Vital signs reviewed and stable  Post vital signs: Reviewed and stable  Last Vitals:  Vitals:   01/30/17 0940 01/30/17 0945  BP: 104/66 (!) 108/59  Pulse: 70 63  Resp: 18 (!) 22  Temp:    SpO2: 99% 98%    Last Pain:  Vitals:   01/30/17 0809  TempSrc: Oral         Complications: No apparent anesthesia complications

## 2017-01-30 NOTE — H&P (Signed)
[name]  Location: [facility] Surgery Patient #: [id number] DOB: Dec 08, 1970 Single / Language: Cleophus Molt / Race: Black or African American Female  History of Present Illness Kimberly Moores A. Manroop Jakubowicz MD Patient words: [name] is a 46 year old with above-mentioned history of right breast cancer currently on neoadjuvant chemotherapy and today's cycle 4 of Taxotere and Cytoxan.  She has completed chemotherapy but has significant property. MRI was repeated which shows minimal change to her right breast cancer. She is here today to discuss surgical options. She is in good spirits and feels well.     Malignant neoplasm of overlapping sites of right breast in female, estrogen receptor positive ([place]) 07/18/2016: Palpable right breast masses for 1 year; 2 adjacent spiculated masses by ultrasound at 1:00 subareolar 4.2 cm mass; 3 satellite nodules at 9:00; 2 abnormal lymph nodes; biopsy of the 2 masses and the lymph node showed similar-appearing 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 She presents today for evaluation of following neoadjuvant chemotherapy.  LABS: None.  EXAM: BILATERAL BREAST MRI WITH AND WITHOUT CONTRAST  TECHNIQUE: Multiplanar, multisequence MR images of both breasts were obtained prior to and following the intravenous administration of 16 ml of MultiHance.  THREE-DIMENSIONAL MR IMAGE RENDERING ON INDEPENDENT WORKSTATION:  Three-dimensional MR images were rendered by post-processing of the original MR data on an independent workstation. The three-dimensional MR images were interpreted, and findings  are reported in the following complete MRI report for this study. Three dimensional images were evaluated at the independent DynaCad workstation  COMPARISON: Previous exam(s).  FINDINGS: Breast composition: b. Scattered fibroglandular tissue.  Background parenchymal enhancement: Minimal.  Right breast: Again noted is the spiculated mass in the retroareolar right breast, which does not appear significantly changed in size in the axial plane. In greatest dimension medial to lateral the mass measures 4.4 cm, previously 4.6 cm remeasured in similar plane. The greatest dimension in the transverse plane is 4.0 cm, stable from prior. The does appear to be mild decrease in bulk in the craniocaudal dimension, though due to the irregular shape of the mass, it is difficult to directly compare with the prior exam. As an example, immediately behind the nipple on the sagittal image, the mass measures 1.4 cm, previously measuring 1.8 cm in the same location. The several surrounding satellite lesions appear stable from prior.  Left breast: No mass or abnormal enhancement. The Port-A-Cath is identified in the left breast.  Lymph nodes: There may be slight decrease in size of the previously biopsied metastatic right axillary lymph node. Again noted is a prominent left axillary lymph node.  Ancillary findings: None.  IMPRESSION: 1. Overall, there does not appear to be a large interval change in the size or extent of the known cancer in the right breast. There does appear to be mild decrease in bulk when evaluated in the sagittal plane. See discussion above.  2. No evidence of left breast malignancy.  RECOMMENDATION: Continued treatment plan.  BI-RADS CATEGORY 6: Known biopsy-proven malignancy.   Electronically Signed By: [name] M.D. On: 10/15/2016 16:48.  The patient is a 46 year old female.   Allergies [name], RMA Compazine  *  ANTIPSYCHOTICS/ANTIMANIC AGENTS* Swelling.  Medication History [name], RMA;  Albuterol Sulfate ((2.5 MG/3ML)0.083% Nebulized Soln, Inhalation) Active. LORazepam (0.5MG Tablet, Oral) Active. Fluconazole (200MG Tablet, Oral) Active. Norvasc (10MG Tablet, Oral) Active. Aspirin (81MG Tablet Chewable, Oral) Active. Nitroglycerin (0.4MG Tab Sublingual, Sublingual) Active. Mometasone Furoate (110MCG/INH Aero Pow Br Act, Inhalation) Active. Lipitor (80MG Tablet, Oral) Active. Plavix (75MG Tablet, Oral) Active. Metoprolol Succinate ER (100MG Tablet ER 24HR, Oral) Active. Losartan Potassium (50MG Tablet, Oral) Active. Medications Reconciled    Vitals [name] RMA 10/25/2016 9:41 AM Weight: 171.4 lb Height: 63in Body Surface Area: 1.81 m Body Mass Index: 30.36 kg/m  Temp.: 97.32F  Pulse: 88 (Regular)  BP: 130/80 (Sitting, Left Arm, Standard)      Physical Exam (Kimberly Stovall A. Kimberly Nowakowski MD  General Mental Status-Alert. General Appearance-Consistent with stated age. Hydration-Well hydrated. Voice-Normal.  Breast Note: Right breast mass located below the nipple areolar complex is intact. Minimal change in size. Left breast is normal. Right breast mass was not fixed but there is retraction of the right nipple. Right axilla is normal. Left axilla is normal.  Cardiovascular Cardiovascular examination reveals -on palpation PMI is normal in location and amplitude, no palpable S3 or S4. Normal cardiac borders., normal heart sounds, regular rate and rhythm with no murmurs, carotid auscultation reveals no bruits and normal pedal pulses bilaterally.  Neurologic Neurologic evaluation reveals -alert and oriented x 3 with no impairment of recent or remote memory. Mental Status-Normal.  Musculoskeletal Normal Exam - Left-Upper Extremity Strength Normal and Lower Extremity Strength Normal. Normal Exam - Right-Upper Extremity  Strength Normal, Lower Extremity Weakness.    Assessment & Plan (Kimberly Crownover A. Kimberly Finlay MD  BREAST CANCER, RIGHT (C50.911) Impression: Patient with minimal response to neoadjuvant chemotherapy. I discussed lumpectomy and mastectomy with her today as well as reconstruction. She is not a good candidate for breast conservation therefore recommended right simple mastectomy targeted right axillary lymph node biopsy and right axillary sentinel lymph node mapping. Refer to plastics for reconstruction options. Discussed treatment options for breast cancer to include breast conservation vs mastectomy with reconstruction. Pt has decided on mastectomy. Risk include bleeding, infection, flap necrosis, pain, numbness, recurrence, hematoma, other surgery needs. Pt understands and agrees to proceed. Risk of sentinel lymph node mapping include bleeding, infection, lymphedema, shoulder pain. stiffness, dye allergy. cosmetic deformity , blood clots, death, need for more surgery. Pt agres to proceed.  Current Plans You are being scheduled for surgery- Our schedulers will call you.  You should hear from our office's scheduling department within 5 working days about the location, date, and time of surgery. We try to make accommodations for patient's preferences in scheduling surgery, but sometimes the OR schedule or the surgeon's schedule prevents [place] from making those accommodations.  If you have not heard from our office [phone]) in 5 working days, call the office and ask for your surgeon's nurse.  If you have other questions about your diagnosis, plan, or surgery, call the office and ask for your surgeon's nurse.  Pt Education - CCS Mastectomy HCI Pt Education - flb breast cancer surgery: discussed with patient and provided information. We discussed the staging and pathophysiology of breast cancer. We discussed all of the different options for treatment for breast cancer including surgery,  chemotherapy, radiation therapy, Herceptin, and antiestrogen therapy. We discussed a sentinel lymph node biopsy as she does not appear to having lymph node involvement right now. We discussed the performance of that with injection of radioactive tracer and blue dye. We discussed  that she would have an incision underneath her axillary hairline. We discussed that there is a bout a 10-20% chance of having a positive node with a sentinel lymph node biopsy and we will await the permanent pathology to make any other first further decisions in terms of her treatment. One of these options might be to return to the operating room to perform an axillary lymph node dissection. We discussed about a 1-2% risk lifetime of chronic shoulder pain as well as lymphedema associated with a sentinel lymph node biopsy. We discussed the options for treatment of the breast cancer which included lumpectomy versus a mastectomy. We discussed the performance of the lumpectomy with a wire placement. We discussed a 10-20% chance of a positive margin requiring reexcision in the operating room. We also discussed that she may need radiation therapy or antiestrogen therapy or both if she undergoes lumpectomy. We discussed the mastectomy and the postoperative care for that as well. We discussed that there is no difference in her survival whether she undergoes lumpectomy with radiation therapy or antiestrogen therapy versus a mastectomy. There is a slight difference in the local recurrence rate being 3-5% with lumpectomy and about 1% with a mastectomy. We discussed the risks of operation including bleeding, infection, possible reoperation. She understands her further therapy will be based on what her stages at the time of her operation.  Pt Education - ABC (After Breast Cancer) Class Info: discussed with patient and provided information.

## 2017-01-30 NOTE — Anesthesia Preprocedure Evaluation (Addendum)
Anesthesia Evaluation  Patient identified by MRN, date of birth, ID band Patient awake    Reviewed: Allergy & Precautions, NPO status , Patient's Chart, lab work & pertinent test results  History of Anesthesia Complications Negative for: history of anesthetic complications  Airway Mallampati: II   Neck ROM: Full    Dental no notable dental hx.    Pulmonary asthma , sleep apnea , Current Smoker,    breath sounds clear to auscultation       Cardiovascular hypertension, + CAD, + Past MI and + Peripheral Vascular Disease   Rhythm:Regular Rate:Normal     Neuro/Psych  Neuromuscular disease    GI/Hepatic GERD  ,  Endo/Other    Renal/GU Renal disease     Musculoskeletal   Abdominal   Peds  Hematology   Anesthesia Other Findings   Reproductive/Obstetrics                             Anesthesia Physical Anesthesia Plan  ASA: III  Anesthesia Plan: General   Post-op Pain Management:  Regional for Post-op pain   Induction: Intravenous  PONV Risk Score and Plan: 3 and Dexamethasone and Treatment may vary due to age or medical condition  Airway Management Planned: Oral ETT  Additional Equipment:   Intra-op Plan:   Post-operative Plan: Extubation in OR  Informed Consent: I have reviewed the patients History and Physical, chart, labs and discussed the procedure including the risks, benefits and alternatives for the proposed anesthesia with the patient or authorized representative who has indicated his/her understanding and acceptance.   Dental advisory given  Plan Discussed with: CRNA  Anesthesia Plan Comments:        Anesthesia Quick Evaluation

## 2017-01-30 NOTE — Anesthesia Postprocedure Evaluation (Signed)
Anesthesia Post Note  Patient: [name]  Procedure(s) Performed: RIGHT SIMPLE MASTECTOMY WITH  RADIOACTIVE SEED TARGETED RIGHT AXILLARY LYMPH NODE EXCISION AND RIGHT AXILLARY SENTINEL LYMPH NODE BIOPSY (Right Breast) RIGHT BREAST RECONSTRUCTION WITH PLACEMENT OF TISSUE EXPANDER AND FLEX HD (ACELLULAR HYDRATED DERMIS) (Right Breast)     Patient location during evaluation: PACU Anesthesia Type: General Level of consciousness: awake and sedated Pain management: pain level controlled Vital Signs Assessment: post-procedure vital signs reviewed and stable Respiratory status: spontaneous breathing, nonlabored ventilation, respiratory function stable and patient connected to nasal cannula oxygen Cardiovascular status: blood pressure returned to baseline and stable Postop Assessment: no apparent nausea or vomiting Anesthetic complications: no    Last Vitals:  Vitals:   01/30/17 1300 01/30/17 1315  BP: (!) 146/91 (!) 141/89  Pulse: 65 72  Resp: 15 (!) 26  Temp:    SpO2: 94% 95%    Last Pain:  Vitals:   01/30/17 0809  TempSrc: Oral                 Mechele Kittleson,JAMES TERRILL

## 2017-01-30 NOTE — Progress Notes (Signed)
Pt arrived to 6n04 from PACU, A/Ox4, denies pain, c/o nausea. Oriented to room and surroundings, no visitors at this time.

## 2017-01-31 ENCOUNTER — Encounter (HOSPITAL_COMMUNITY): Payer: Self-pay | Admitting: Surgery

## 2017-01-31 DIAGNOSIS — C50911 Malignant neoplasm of unspecified site of right female breast: Secondary | ICD-10-CM | POA: Diagnosis not present

## 2017-01-31 LAB — HIV ANTIBODY (ROUTINE TESTING W REFLEX): HIV Screen 4th Generation wRfx: NONREACTIVE

## 2017-01-31 MED ORDER — SENNA 8.6 MG PO TABS: 1.0000 | ORAL_TABLET | Freq: Two times a day (BID) | ORAL | 0 refills | Status: DC

## 2017-01-31 MED ORDER — DIPHENHYDRAMINE HCL 12.5 MG/5ML PO ELIX: 12.5000 mg | ORAL_SOLUTION | Freq: Four times a day (QID) | ORAL | 0 refills | Status: DC | PRN

## 2017-01-31 MED ORDER — DIAZEPAM 2 MG PO TABS: 2.0000 mg | ORAL_TABLET | Freq: Three times a day (TID) | ORAL | 0 refills | Status: DC | PRN

## 2017-01-31 MED ORDER — POLYETHYLENE GLYCOL 3350 17 G PO PACK: 17.0000 g | PACK | Freq: Every day | ORAL | 0 refills | Status: DC | PRN

## 2017-01-31 MED ORDER — HYDROCODONE-ACETAMINOPHEN 5-325 MG PO TABS: 1.0000 | ORAL_TABLET | ORAL | 0 refills | Status: DC | PRN

## 2017-01-31 NOTE — Discharge Summary (Signed)
Physician Discharge Summary  Patient ID: [name] MRN: [id number] DOB/AGE: [birth year] 45 y.o.  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 She underwent right simple mastectomy with targeted right axillary deep lymph node biopsy and right sentinel lymph node mapping with methylene blue dye by Dr. Brantley Stage followed by immediate right breast reconstruction with placement of right breast tissue expander and Flex HD dermal matrix. She has done well post operatively and is medically stable and ready for discharge home.   Consults: None  Significant Diagnostic Studies: labs: pathology pending  Treatments: surgery: right simple mastectomy with targeted right axillary deep lymph node biopsy and right sentinel lymph node mapping with methylene blue dye by Dr. Brantley Stage  Immediate right breast reconstruction with placement of right breast tissue expander and Flex HD dermal matrix. Right - [place] 375 cc. Ref #[id number].  Serial Number [id number];  100 cc saline placed Acellular Dermal Matrix 4 x 16 cm    Discharge Exam: Blood pressure (!) 151/81, pulse 71, temperature 98.8 F (37.1 C), temperature source Oral, resp. rate 18, height 5\' 3"  (1.6 m), weight 76.9 kg (169 lb 8 oz), SpO2 94 %. General appearance: alert, cooperative, appears stated age and no distress Resp: clear to auscultation bilaterally Cardio: regular rate and rhythm Incision/Wound: Right breast flap viable and without signs of hematoma or seroma. JP drainage moderate and serosanguinous.   Disposition: 01-Home or Self Care   Allergies as of 01/31/2017      Reactions   Compazine  [prochlorperazine] Anaphylaxis, Swelling   Throat swelling   Ondansetron Hcl Anaphylaxis, Swelling   Throat swelling per patient      Medication List    TAKE these medications   acetaminophen 500 MG tablet Commonly known as:  TYLENOL Take 1,000 mg by mouth every 8 (eight) hours as needed for mild pain or headache.   albuterol (2.5 MG/3ML) 0.083% nebulizer solution Commonly known as:  PROVENTIL Take 3 mLs (2.5 mg total) by nebulization every 6 (six) hours as needed for wheezing or shortness of breath.   PROAIR HFA 108 (90 Base) MCG/ACT inhaler Generic drug:  albuterol Inhale 2 puffs into the lungs every 6 (six) hours as needed for wheezing or shortness of breath.   amLODipine 10 MG tablet Commonly known as:  NORVASC Take 10 mg by mouth daily.   aspirin 81 MG chewable tablet Chew 81 mg by mouth daily.   atorvastatin 80 MG tablet Commonly known as:  LIPITOR Take 1 tablet (80 mg total) by mouth daily at 6 PM.   betamethasone valerate ointment 0.1 % Commonly known as:  VALISONE Apply 1 application topically 2 (two) times daily.   diazepam 2 MG tablet Commonly known as:  VALIUM Take 1 tablet (2 mg total) every 8 (eight) hours as needed by mouth for muscle spasms.   diphenhydrAMINE 12.5 MG/5ML elixir Commonly known as:  BENADRYL Take 5 mLs (12.5 mg total) every 6 (six) hours as needed by mouth for itching.   HYDROcodone-acetaminophen 5-325 MG tablet Commonly known as:  NORCO/VICODIN Take 1-2 tablets every 4 (four) hours as needed by mouth for moderate pain.   isosorbide mononitrate 60 MG 24 hr tablet Commonly known as:  IMDUR  Take 1 tablet (60 mg total) by mouth daily.   losartan 50 MG tablet Commonly known as:  COZAAR Take 50 mg by mouth daily.   metoprolol succinate 100 MG 24 hr tablet Commonly known as:  TOPROL XL Take 1 tablet (100 mg total) by mouth daily. Take with or immediately following a meal.   mometasone-formoterol 200-5 MCG/ACT Aero Commonly known  as:  DULERA Inhale 2 puffs into the lungs 2 (two) times daily.   nitroGLYCERIN 0.4 MG SL tablet Commonly known as:  NITROSTAT Place 0.4 mg under the tongue every 5 (five) minutes as needed for chest pain.   polyethylene glycol packet Commonly known as:  MIRALAX / GLYCOLAX Take 17 g daily as needed by mouth for mild constipation.   senna 8.6 MG Tabs tablet Commonly known as:  SENOKOT Take 1 tablet (8.6 mg total) 2 (two) times daily by mouth.   tamoxifen 20 MG tablet Commonly known as:  NOLVADEX Take 1 tablet (20 mg total) by mouth daily.   venlafaxine XR 37.5 MG 24 hr capsule Commonly known as:  EFFEXOR-XR Take 1 capsule (37.5 mg total) by mouth daily with breakfast.      Follow-up Information    Dillingham, Loel Lofty, DO In 1 week.   Specialty:  Plastic Surgery Contact information: [place] [address] [phone]        Erroll Luna, MD In 2 weeks.   Specialty:  General Surgery Contact information: [address] [place] [address] [phone]           Signed: [facility] Plastic [facility] [phone]

## 2017-01-31 NOTE — Progress Notes (Signed)
1 Day Post-Op   Subjective/Chief Complaint: Doing well   Objective: Vital signs in last 24 hours: Temp:  [97.9 F (36.6 C)-99.1 F (37.3 C)] 98.8 F (37.1 C) (11/09 0552) Pulse Rate:  [60-78] 64 (11/09 0552) Resp:  [15-26] 17 (11/09 0552) BP: (104-149)/(54-94) 124/72 (11/09 0552) SpO2:  [92 %-100 %] 100 % (11/09 0552)    Intake/Output from previous day: 11/08 0701 - 11/09 0700 In: 3572.5 [P.O.:640; I.V.:2732.5; IV Piggyback:200] Out: 2925 [Urine:2655; Drains:170; Blood:100] Intake/Output this shift: No intake/output data recorded.  Incision/Wound:flaps viable  No hematoma  Serosanguinous drain output   Lab Results:  No results for input(s): WBC, HGB, HCT, PLT in the last 72 hours. BMET No results for input(s): NA, K, CL, CO2, GLUCOSE, BUN, CREATININE, CALCIUM in the last 72 hours. PT/INR No results for input(s): LABPROT, INR in the last 72 hours. ABG No results for input(s): PHART, HCO3 in the last 72 hours.  Invalid input(s): PCO2, PO2  Studies/Results: Nm Sentinel Node Inj-no Rpt (breast)  Result Date: 01/30/2017 Sulfur colloid was injected by the nuclear medicine technologist for melanoma sentinel node.   Mm Breast Surgical Specimen  Result Date: 01/30/2017 CLINICAL DATA:  Status post surgical excision of a right axillary lesion following ultrasound-guided seed localization. EXAM: SPECIMEN RADIOGRAPH OF THE RIGHT BREAST/AXILLA COMPARISON:  Previous exam(s). FINDINGS: Status post excision of the right breast/axilla. The radioactive seed and biopsy marker clip are present, completely intact, and were marked for pathology. IMPRESSION: Specimen radiograph of the right breast/axilla. Electronically Signed   By: Lajean Manes M.D.   On: 01/30/2017 11:10    Anti-infectives: Anti-infectives (From admission, onward)   Start     Dose/Rate Route Frequency Ordered Stop   01/30/17 1800  ceFAZolin (ANCEF) IVPB 2g/100 mL premix     2 g 200 mL/hr over 30 Minutes Intravenous  Every 8 hours 01/30/17 1454     01/30/17 1035  polymyxin B 500,000 Units, bacitracin 50,000 Units in sodium chloride 0.9 % 500 mL irrigation  Status:  Discontinued       As needed 01/30/17 1035 01/30/17 1238   01/30/17 0830  ceFAZolin (ANCEF) IVPB 2g/100 mL premix     2 g 200 mL/hr over 30 Minutes Intravenous  Once 01/30/17 0827 01/30/17 1007      Assessment/Plan: s/p Procedure(s): RIGHT SIMPLE MASTECTOMY WITH  RADIOACTIVE SEED TARGETED RIGHT AXILLARY LYMPH NODE EXCISION AND RIGHT AXILLARY SENTINEL LYMPH NODE BIOPSY (Right) RIGHT BREAST RECONSTRUCTION WITH PLACEMENT OF TISSUE EXPANDER AND FLEX HD (ACELLULAR HYDRATED DERMIS) (Right) doing well  Discharge per plastics  Follow up 2 weeks   LOS: 0 days    [name] A. 01/31/2017

## 2017-01-31 NOTE — Progress Notes (Signed)
Patient was given discharge instructions. Patient did teach back with JP drain and documentation. Patient made aware of follow up appointments. Patient's personal belongings were returned and patient left unit with daughter in stable condition.

## 2017-01-31 NOTE — Progress Notes (Signed)
1 Day Post-Op   Subjective/Chief Complaint: Doing well. Pain under good control. Ready for discharge home with family.   Objective: Vital signs in last 24 hours: Temp:  [97.9 F (36.6 C)-99.1 F (37.3 C)] 98.8 F (37.1 C) (11/09 0552) Pulse Rate:  [64-78] 71 (11/09 0931) Resp:  [15-26] 18 (11/09 0931) BP: (119-151)/(72-94) 151/81 (11/09 0827) SpO2:  [92 %-100 %] 94 % (11/09 0931) Last BM Date: (UTA, pt unsure )  Intake/Output from previous day: 11/08 0701 - 11/09 0700 In: 3572.5 [P.O.:640; I.V.:2732.5; IV Piggyback:200] Out: 2925 [Urine:2655; Drains:170; Blood:100] Intake/Output this shift: Total I/O In: 220 [P.O.:220] Out: 25 [Drains:25]  General appearance: alert, cooperative, appears stated age and no distress Right breast flap viable and without signs of infection or hematoma or seroma  JP drainage moderate and serosanguinous  Lab Results:  No results for input(s): WBC, HGB, HCT, PLT in the last 72 hours. BMET No results for input(s): NA, K, CL, CO2, GLUCOSE, BUN, CREATININE, CALCIUM in the last 72 hours. PT/INR No results for input(s): LABPROT, INR in the last 72 hours. ABG No results for input(s): PHART, HCO3 in the last 72 hours.  Invalid input(s): PCO2, PO2  Studies/Results: Nm Sentinel Node Inj-no Rpt (breast)  Result Date: 01/30/2017 Sulfur colloid was injected by the nuclear medicine technologist for melanoma sentinel node.   Mm Breast Surgical Specimen  Result Date: 01/30/2017 CLINICAL DATA:  Status post surgical excision of a right axillary lesion following ultrasound-guided seed localization. EXAM: SPECIMEN RADIOGRAPH OF THE RIGHT BREAST/AXILLA COMPARISON:  Previous exam(s). FINDINGS: Status post excision of the right breast/axilla. The radioactive seed and biopsy marker clip are present, completely intact, and were marked for pathology. IMPRESSION: Specimen radiograph of the right breast/axilla. Electronically Signed   By: Lajean Manes M.D.   On:  01/30/2017 11:10    Anti-infectives: Anti-infectives (From admission, onward)   Start     Dose/Rate Route Frequency Ordered Stop   01/30/17 1800  ceFAZolin (ANCEF) IVPB 2g/100 mL premix     2 g 200 mL/hr over 30 Minutes Intravenous Every 8 hours 01/30/17 1454     01/30/17 1035  polymyxin B 500,000 Units, bacitracin 50,000 Units in sodium chloride 0.9 % 500 mL irrigation  Status:  Discontinued       As needed 01/30/17 1035 01/30/17 1238   01/30/17 0830  ceFAZolin (ANCEF) IVPB 2g/100 mL premix     2 g 200 mL/hr over 30 Minutes Intravenous  Once 01/30/17 0827 01/30/17 1007      Assessment/Plan: s/p Procedure(s): RIGHT SIMPLE MASTECTOMY WITH  RADIOACTIVE SEED TARGETED RIGHT AXILLARY LYMPH NODE EXCISION AND RIGHT AXILLARY SENTINEL LYMPH NODE BIOPSY (Right) RIGHT BREAST RECONSTRUCTION WITH PLACEMENT OF TISSUE EXPANDER AND FLEX HD (ACELLULAR HYDRATED DERMIS) (Right) Discharge  LOS: 0 days    Annalena Piatt,PA-C Plastic Surgery [phone]

## 2017-02-06 ENCOUNTER — Other Ambulatory Visit: Payer: Self-pay | Admitting: *Deleted

## 2017-02-06 ENCOUNTER — Encounter: Payer: Self-pay | Admitting: Radiation Oncology

## 2017-02-06 DIAGNOSIS — Z170 Estrogen receptor positive status [ER+]: Principal | ICD-10-CM

## 2017-02-06 DIAGNOSIS — C50811 Malignant neoplasm of overlapping sites of right female breast: Secondary | ICD-10-CM

## 2017-02-07 ENCOUNTER — Ambulatory Visit: Payer: Medicaid Other | Admitting: Hematology and Oncology

## 2017-02-07 NOTE — Assessment & Plan Note (Deleted)
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  Treatment summary 1. Neoadjuvant chemotherapy with Taxotereand Cytoxan every 3 weeks4 completed July 2018 2. 01/30/2017: Right mastectomy: IDC grade 3, 7.2 cm, high-grade DCIS, lymphovascular invasion present including dermal lymphatics, perineural invasion present, margins negative, 4/5 lymph nodes positive with extracapsular extension, ER 100%, PR 100%, HER-2 negative ratio 1.41 3. Followed by adjuvant radiation therapy 4.  Followed by adjuvant antiestrogen therapy ---------------------------------------------------------------------  Pathology counseling: I discussed the final pathology report of the patient provided  a copy of this report. I discussed the margins as well as lymph node surgeries. We also discussed the final staging along with previously performed ER/PR and HER-2/neu testing.  Patient will be set up for adjuvant radiation therapy and I will see her towards the end of radiation to restart antiestrogen therapy.

## 2017-02-11 ENCOUNTER — Ambulatory Visit (HOSPITAL_BASED_OUTPATIENT_CLINIC_OR_DEPARTMENT_OTHER): Payer: Medicaid Other | Admitting: Hematology and Oncology

## 2017-02-11 DIAGNOSIS — C50811 Malignant neoplasm of overlapping sites of right female breast: Secondary | ICD-10-CM

## 2017-02-11 DIAGNOSIS — Z170 Estrogen receptor positive status [ER+]: Secondary | ICD-10-CM | POA: Diagnosis not present

## 2017-02-11 NOTE — Progress Notes (Signed)
Patient Care Team: Javier Docker, MD as PCP - General (Internal Medicine) Erroll Luna, MD as Consulting Physician (General Surgery) Nicholas Lose, MD as Consulting Physician (Hematology and Oncology) Kyung Rudd, MD as Consulting Physician (Radiation Oncology) Delice Bison Charlestine Massed, NP as Nurse Practitioner (Hematology and Oncology)  DIAGNOSIS:  Encounter Diagnosis  Name Primary?  . Malignant neoplasm of overlapping sites of right breast in female, estrogen receptor positive ([place])     SUMMARY OF ONCOLOGIC HISTORY:   Malignant neoplasm of overlapping sites of right breast in female, estrogen receptor positive ([place])   07/18/2016 Initial Diagnosis    Palpable right breast masses for 1 year; 2 adjacent spiculated masses by ultrasound at 1:00 subareolar 4.2 cm mass; 3 satellite nodules at 9:00; 2 abnormal lymph nodes; biopsy of the 2 masses and the lymph node showed similar-appearing grade 3 IDC ER 100% PR 95% Ki-67 20-30%, HER-2 negative ratio 1.57; T2 N1 stage IIB (AJCC 8)      08/09/2016 - 10/11/2016 Neo-Adjuvant Chemotherapy    Taxotere and Cytoxan every 3 weeks 4 cycles      08/22/2016 Genetic Testing    BRCA1 c.1802A>G VUS identified on the common hereditary cancer panel.  The Hereditary Gene Panel offered by [facility] includes sequencing and/or deletion duplication testing of the following 46 genes: APC, ATM, AXIN2, BARD1, BMPR1A, BRCA1, BRCA2, BRIP1, CDH1, CDKN2A (p14ARF), CDKN2A (p16INK4a), CHEK2, CTNNA1, DICER1, EPCAM (Deletion/duplication testing only), GREM1 (promoter region deletion/duplication testing only), KIT, MEN1, MLH1, MSH2, MSH3, MSH6, MUTYH, NBN, NF1, NHTL1, PALB2, PDGFRA, PMS2, POLD1, POLE, PTEN, RAD50, RAD51C, RAD51D, SDHB, SDHC, SDHD, SMAD4, SMARCA4. STK11, TP53, TSC1, TSC2, and VHL.  The following genes were evaluated for sequence changes only: SDHA and HOXB13 c.251G>A variant only.  The report date is Aug 22, 2016.       01/01/2017 -  Anti-estrogen oral  therapy    Tamoxifen 20 mg daily      01/30/2017 Surgery    Right mastectomy: IDC grade 3, 7.2 cm, high-grade DCIS, lymphovascular invasion present including dermal lymphatics, perineural invasion present, margins negative, 4/5 lymph nodes positive with extracapsular extension, ER 100%, PR 100%, HER-2 negative ratio 1.41       CHIEF COMPLIANT: Follow-up after recent right mastectomy  INTERVAL HISTORY: [name] is a 46 year old with above-mentioned history of left breast cancer who underwent neoadjuvant therapy with tamoxifen for a month and finally underwent mastectomy on 01/30/2017.  The final pathology revealed 7.2 cm invasive ductal carcinoma grade 3 that was involving 4 out of the 5 lymph nodes removed with extracapsular extension.  There was extensive lymphovascular and perineural invasion identified.  The tumor was ER PR positive HER-2 equivocal by IHC and fish with a Ki-67 of 20-30%.  She is here today to discuss adjuvant treatment plan.  REVIEW OF SYSTEMS:   Constitutional: Denies fevers, chills or abnormal weight loss Eyes: Denies blurriness of vision Ears, nose, mouth, throat, and face: Denies mucositis or sore throat Respiratory: Denies cough, dyspnea or wheezes Cardiovascular: Denies palpitation, chest discomfort Gastrointestinal:  Denies nausea, heartburn or change in bowel habits Skin: Denies abnormal skin rashes Lymphatics: Denies new lymphadenopathy or easy bruising Neurological:Denies numbness, tingling or new weaknesses Behavioral/Psych: Mood is stable, no new changes  Extremities: No lower extremity edema Breast: Right mastectomy with reconstruction All other systems were reviewed with the patient and are negative.  I have reviewed the past medical history, past surgical history, social history and family history with the patient and they are unchanged from previous note.  ALLERGIES:  is allergic to compazine [prochlorperazine] and ondansetron  hcl.  MEDICATIONS:  Current Outpatient Medications  Medication Sig Dispense Refill  . acetaminophen (TYLENOL) 500 MG tablet Take 1,000 mg by mouth every 8 (eight) hours as needed for mild pain or headache.    . albuterol (PROVENTIL) (2.5 MG/3ML) 0.083% nebulizer solution Take 3 mLs (2.5 mg total) by nebulization every 6 (six) hours as needed for wheezing or shortness of breath. 75 mL 12  . amLODipine (NORVASC) 10 MG tablet Take 10 mg by mouth daily.    Marland Kitchen aspirin 81 MG chewable tablet Chew 81 mg by mouth daily.    Marland Kitchen atorvastatin (LIPITOR) 80 MG tablet Take 1 tablet (80 mg total) by mouth daily at 6 PM. 30 tablet 0  . betamethasone valerate ointment (VALISONE) 0.1 % Apply 1 application topically 2 (two) times daily. 45 g 1  . diazepam (VALIUM) 2 MG tablet Take 1 tablet (2 mg total) every 8 (eight) hours as needed by mouth for muscle spasms. 30 tablet 0  . diphenhydrAMINE (BENADRYL) 12.5 MG/5ML elixir Take 5 mLs (12.5 mg total) every 6 (six) hours as needed by mouth for itching. 120 mL 0  . HYDROcodone-acetaminophen (NORCO/VICODIN) 5-325 MG tablet Take 1-2 tablets every 4 (four) hours as needed by mouth for moderate pain. 30 tablet 0  . isosorbide mononitrate (IMDUR) 60 MG 24 hr tablet Take 1 tablet (60 mg total) by mouth daily. 90 tablet 3  . losartan (COZAAR) 50 MG tablet Take 50 mg by mouth daily.  3  . metoprolol succinate (TOPROL XL) 100 MG 24 hr tablet Take 1 tablet (100 mg total) by mouth daily. Take with or immediately following a meal. 30 tablet 0  . mometasone-formoterol (DULERA) 200-5 MCG/ACT AERO Inhale 2 puffs into the lungs 2 (two) times daily. 1 Inhaler 1  . nitroGLYCERIN (NITROSTAT) 0.4 MG SL tablet Place 0.4 mg under the tongue every 5 (five) minutes as needed for chest pain.    . polyethylene glycol (MIRALAX / GLYCOLAX) packet Take 17 g daily as needed by mouth for mild constipation. 14 each 0  . PROAIR HFA 108 (90 Base) MCG/ACT inhaler Inhale 2 puffs into the lungs every 6 (six)  hours as needed for wheezing or shortness of breath.   3  . senna (SENOKOT) 8.6 MG TABS tablet Take 1 tablet (8.6 mg total) 2 (two) times daily by mouth. 120 each 0  . tamoxifen (NOLVADEX) 20 MG tablet Take 1 tablet (20 mg total) by mouth daily. (Patient not taking: Reported on 01/15/2017) 90 tablet 3  . venlafaxine XR (EFFEXOR-XR) 37.5 MG 24 hr capsule Take 1 capsule (37.5 mg total) by mouth daily with breakfast. 30 capsule 6   No current facility-administered medications for this visit.     PHYSICAL EXAMINATION: ECOG PERFORMANCE STATUS: 1 - Symptomatic but completely ambulatory  Vitals:   02/11/17 1558  BP: (!) 145/79  Pulse: 67  Resp: 20  Temp: 98.3 F (36.8 C)  SpO2: 99%   Filed Weights   02/11/17 1558  Weight: 169 lb 8 oz (76.9 kg)    GENERAL:alert, no distress and comfortable SKIN: skin color, texture, turgor are normal, no rashes or significant lesions EYES: normal, Conjunctiva are pink and non-injected, sclera clear OROPHARYNX:no exudate, no erythema and lips, buccal mucosa, and tongue normal  NECK: supple, thyroid normal size, non-tender, without nodularity LYMPH:  no palpable lymphadenopathy in the cervical, axillary or inguinal LUNGS: clear to auscultation and percussion with normal breathing effort HEART:  regular rate & rhythm and no murmurs and no lower extremity edema ABDOMEN:abdomen soft, non-tender and normal bowel sounds MUSCULOSKELETAL:no cyanosis of digits and no clubbing  NEURO: alert & oriented x 3 with fluent speech, no focal motor/sensory deficits EXTREMITIES: No lower extremity edema  LABORATORY DATA:  I have reviewed the data as listed   Chemistry      Component Value Date/Time   NA 140 01/23/2017 1031   NA 142 10/11/2016 0955   K 3.4 (L) 01/23/2017 1031   K 3.2 (L) 10/11/2016 0955   CL 107 01/23/2017 1031   CO2 23 01/23/2017 1031   CO2 23 10/11/2016 0955   BUN 5 (L) 01/23/2017 1031   BUN 6.8 (L) 10/11/2016 0955   CREATININE 0.71 01/23/2017  1031   CREATININE 0.7 10/11/2016 0955      Component Value Date/Time   CALCIUM 9.4 01/23/2017 1031   CALCIUM 9.5 10/11/2016 0955   ALKPHOS 99 12/19/2016 0948   ALKPHOS 85 10/11/2016 0955   AST 18 12/19/2016 0948   AST 10 10/11/2016 0955   ALT 13 (L) 12/19/2016 0948   ALT 10 10/11/2016 0955   BILITOT 0.3 12/19/2016 0948   BILITOT 0.44 10/11/2016 0955       Lab Results  Component Value Date   WBC 11.7 (H) 01/23/2017   HGB 14.3 01/23/2017   HCT 42.1 01/23/2017   MCV 90.0 01/23/2017   PLT 247 01/23/2017   NEUTROABS 5.1 12/19/2016    ASSESSMENT & PLAN:  Malignant neoplasm of overlapping sites of right breast in female, estrogen receptor positive ([place]) 07/18/2016: Palpable right breast masses for 1 year; 2 adjacent spiculated masses by ultrasound at 1:00 subareolar 4.2 cm mass; 3 satellite nodules at 9:00; 2 abnormal lymph nodes; biopsy of the 2 masses and the lymph node showed similar-appearing 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  Recommendationbased on multidisciplinary tumor board: 1. Neoadjuvant chemotherapy with Taxotereand Cytoxan every 3 weeks4  completed July 2018 2. 01/30/2017:Right mastectomy: IDC grade 3, 7.2 cm, high-grade DCIS, lymphovascular invasion present including dermal lymphatics, perineural invasion present, margins negative, 4/5 lymph nodes positive with extracapsular extension, ER 100%, PR 100%, HER-2 negative ratio 1.41 3. Followed by adjuvant radiation therapy 5. Followed by antiestrogen therapy with tamoxifen (consideration for [place] clinical trial) ---------------------------------------------------------------------  Return to clinic after radiation is complete I explained to her that patient has very high risk of recurrence based on very poor response to systemic chemotherapy..  I  spent 25 minutes talking to the patient of which more than half was spent in counseling and coordination of care.  No orders of the defined types were placed in this encounter.  The patient has a good understanding of the overall plan. she agrees with it. she will call with any problems that may develop before the next visit here.   Rulon Eisenmenger, MD 02/11/17

## 2017-02-11 NOTE — Assessment & Plan Note (Signed)
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  Recommendationbased on multidisciplinary tumor board: 1. Neoadjuvant chemotherapy with Taxotereand Cytoxan every 3 weeks4  completed July 2018 2. 01/30/2017:Right mastectomy: IDC grade 3, 7.2 cm, high-grade DCIS, lymphovascular invasion present including dermal lymphatics, perineural invasion present, margins negative, 4/5 lymph nodes positive with extracapsular extension, ER 100%, PR 100%, HER-2 negative ratio 1.41 3. Followed by adjuvant radiation therapy 4.  Followed by antiestrogen therapy with tamoxifen --------------------------------------------------------------------- Return to clinic after radiation therapy to resume tamoxifen therapy Tamoxifen toxicities:  Return to clinic in 6 months for follow-up

## 2017-02-12 ENCOUNTER — Other Ambulatory Visit: Payer: Self-pay | Admitting: Adult Health

## 2017-02-12 DIAGNOSIS — Z8579 Personal history of other malignant neoplasms of lymphoid, hematopoietic and related tissues: Secondary | ICD-10-CM

## 2017-02-12 DIAGNOSIS — Z170 Estrogen receptor positive status [ER+]: Secondary | ICD-10-CM

## 2017-02-12 DIAGNOSIS — C50811 Malignant neoplasm of overlapping sites of right female breast: Secondary | ICD-10-CM

## 2017-02-17 ENCOUNTER — Ambulatory Visit: Payer: Self-pay | Admitting: Surgery

## 2017-02-17 NOTE — H&P (Signed)
[name] 02/17/2017 11:40 AM Location: [facility] Patient #: [id number] DOB: 05/12/1970 Single / Language: English / Race: Black or African American Female  History of Present Illness ([name] A. Lenford Beddow MD; 02/17/2017 12:03 PM) Patient words: Patient returns to recheck after right simple mastectomy with targeted right axillary sentinel lymph node mapping. Her final pathology showed a tumor size 7.2 cm. She did have 5 lymph nodes that showed residual metastatic carcinoma with extracapsular extension. She had concomitant reconstruction. She will require radiation therapy. She is in good spirits at this point in time.                         ADDITIONAL INFORMATION: 2. By immunohistochemistry, the tumor cells are equivocal for Her2 (2+). Kimberly PATRICK MD Pathologist, Electronic Signature ( Signed 02/07/2017) 2. PROGNOSTIC INDICATORS Results: IMMUNOHISTOCHEMICAL AND MORPHOMETRIC ANALYSIS PERFORMED MANUALLY Estrogen Receptor: 100%, POSITIVE, STRONG STAINING INTENSITY Progesterone Receptor: 100%, POSITIVE, STRONG STAINING INTENSITY REFERENCE RANGE ESTROGEN RECEPTOR NEGATIVE 0% POSITIVE =>1% REFERENCE RANGE PROGESTERONE RECEPTOR NEGATIVE 0% POSITIVE =>1% All controls stained appropriately Kimberly BUTLER MD Pathologist, Electronic Signature ( Signed 02/04/2017) 2. FLUORESCENCE IN-SITU HYBRIDIZATION Results: HER2 - EQUIVOCAL. HER2-NEU BY IMMUNOHISTOCHEMISTRY WILL BE PERFORMED. 1 of 4 FINAL for [name] ([name]) ADDITIONAL INFORMATION:(continued) RATIO OF HER2/CEP17 SIGNALS 1.41 AVERAGE HER2 COPY NUMBER PER CELL 3.97 Reference Range: NEGATIVE HER2/CEP17 Ratio <2.0 and average HER2 copy number <4.0 EQUIVOCAL HER2/CEP17 Ratio <2.0 and average HER2 copy number >=4.0 and <6.0 POSITIVE HER2/CEP17 Ratio >=2.0 or <2.0 and average HER2 copy number >=6.0 Kimberly PATRICK MD Pathologist, Electronic Signature ( Signed 02/04/2017) FINAL  DIAGNOSIS Diagnosis 1. 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 2. 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 - DUCTAL CARCINOMA IN SITU, HIGH GRADE. - LYMPHOVASCULAR INVASION IS IDENTIFIED, INCLUDING DERMAL LYMPHATICS. - PERINEURAL INVASION IS IDENTIFIED. - THE SURGICAL RESECTION MARGINS ARE NEGATIVE FOR CARCINOMA. - SEE ONCOLOGY TABLE BELOW. 3. Lymph nodes, regional resection, right axillary - METASTATIC CARCINOMA IN 2 OF 2 LYMPH NODES (2/2), WITH EXTRACAPSULAR EXTENSION. Microscopic Comment 2. BREAST, STATUS POST NEOADJUVANT TREATMENT Procedure: Simple mastectomy with axillary lymph node resections. Laterality: Right. Tumor Size: At least 7.2 cm. Histologic Type: Ductal. Grade: III. Tubular Differentiation: 3. Nuclear Pleomorphism: 2. Mitotic Count: 3. Ductal Carcinoma in Situ (DCIS): Present, high grade. Regional Lymph Nodes: Number of Lymph Nodes Examined: 5. Lymph Nodes with Macrometastases: 4. Lymph Nodes with Micrometastases: 0. Lymph Nodes with Isolated Tumor Cells: 0. Margins: Greater than 0.2 cm to all margins. Extent of Tumor: Involves the dermis of the nipple. Breast Prognostic Profile (pre-neoadjuvant case #: [id number]) Estrogen Receptor: 100%, strong. Progesterone Receptor: 95%, strong. 2 of 4 FINAL for [name] ([name]) Microscopic Comment(continued) Her2: No amplification was detected. Ki-67: 20% - 30%. Will be repeated on the current case (Block #: 2A) and the results reported separately. Residual Cancer Burden (RCB): Primary Tumor Bed: 72 mm x 54 mm. Overall Cancer Cellularity: 60. Percentage of Cancer that is in Situ: 10. Number of Positive Lymph Nodes: 4. Diameter of Largest Lymph Node metastasis: 10 mm. Residual Cancer Burden: 4.3. Residual Cancer Burden Class: RCB-III. Pathologic  Stage Classification (p TNM, AJCC 8th Edition): Primary Tumor (ypT): ypT3. Regional Lymph Nodes (ypN): ypN2a. Comments: Grossly, the largest nodule is 4.2 cm. Immediately adjacent to this main nodule is a cluster of ill defined satellite nodules spanning 3.0   cm. Thus, the tumor overall spans 7.2 cm and is staged as such. There is only minimal treatment-related changes present in the tumor. (JBK:ah 01/31/17) Kimberly KISH MD Pathologist, Electronic Signature (Case signed 01/31/2017) Specimen Gross and Clinical Information Specimen(s) Obtained: 1. Lymph nodes, regional resection, right axillary 2. Breast, simple mastectomy, right 3. Lymph nodes, regional resection, right axillary Specimen Clinical Information 1. right breast cancer (cm) Gross 1. The specimen is received fresh and consists of three tan-pink to red possible lymph nodes, ranging from 1.0 x 0.9 x 0.4 cm to 4.0 x 2.6 x 1.5 cm. Within the middle lymph node a radioactive seed is identified and removed per protocol. The largest possible lymph node is focally disrupted, and is serially sectioned to reveal a tan-red cut surface, which has been focally stained blue. Identified within the possible lymph node is a small silver metallic coil-shaped biopsy clip. Possible lymph nodes are entirely submitted in ten cassettes. A = one possible lymph node, serially sectioned. B, C = possible lymph node with seed, serially sectioned. Bates - J = largest possible lymph node with biopsy clip, serially sectioned. 2. Specimen: Received fresh and placed in formalin at 11:35 Kimberlym. on 01/30/2017, and labeled right mastectomy. Specimen integrity (intact/disrupted): Intact, with a suture designating the superior aspect. Weight: 573 grams Size: 20.0 cm superior to inferior x 19.4 cm medial to lateral x 4.2 cm anterior to posterior. Skin: There is a 13.5 x 5.0 cm ellipse of tan skin, with a 1.5 x 1.4 cm brown partially retracted nipple. The skin  surface surrounding the nipple and areola is indurated. Tumor/cavity: Within the central portion of the breast, extending medially, there is a 4.2 x 3.2 x 2.7 cm tan-pink, firm, ill-defined lesion identified. The lesion extends to the anterior skin and nipple. Along the medial aspect of the lesion there is an area of yellow-red hemorrhage noted. The lesion measures 1.0 cm to the posterior margin. Along the lateral aspect of the lesion, several tan-pink, indurated, ill-defined possible satellite.  The patient is a 46 year old female.   Allergies (Tanisha A. Brown, RMA; 02/17/2017 11:41 AM) Compazine *ANTIPSYCHOTICS/ANTIMANIC AGENTS* Swelling. Allergies Reconciled  Medication History (Tanisha A. Brown, RMA; 02/17/2017 11:41 AM) Albuterol Sulfate ((2.5 MG/3ML)0.083% Nebulized Soln, Inhalation) Active. LORazepam (0.5MG Tablet, Oral) Active. Fluconazole (200MG Tablet, Oral) Active. Norvasc (10MG Tablet, Oral) Active. Aspirin (81MG Tablet Chewable, Oral) Active. Nitroglycerin (0.4MG Tab Sublingual, Sublingual) Active. Mometasone Furoate (110MCG/INH Aero Pow Br Act, Inhalation) Active. Lipitor (80MG Tablet, Oral) Active. Plavix (75MG Tablet, Oral) Active. Metoprolol Succinate ER (100MG Tablet ER 24HR, Oral) Active. Losartan Potassium (50MG Tablet, Oral) Active. Medications Reconciled    Vitals (Tanisha A. Brown RMA; 02/17/2017 11:40 AM) 02/17/2017 11:40 AM Weight: 170.2 lb Height: 63in Body Surface Area: 1.81 m Body Mass Index: 30.15 kg/m  Temp.: 98.2F  Pulse: 109 (Regular)  BP: 138/84 (Sitting, Left Arm, Standard)      Physical Exam (Jamil Castillo A. Zissel Biederman MD; 02/17/2017 12:03 PM)  Breast Note: Right breast incision is intact for mastectomy. There is some epidermal lysis in the medial part incision with no signs of infection. Drain is serous.    Assessment & Plan (Carmita Boom A. Joani Cosma MD; 02/17/2017 12:04 PM)  POST-OPERATIVE STATE  (Z98.890) Impression: Case discussed at tumor board. She has 4/5 nodes with disease after TLND dissection and were positive for recurrent and persistent carcinoma. This is after chemotherapy. She will require radiation therapy due to her size and lymph node involvement but we feel that completion lymph node   dissection necessary given her case and given her young age and extent of disease with extracapsular extension. The other option is radiation alone but recurrence rate higher This will not change her survival and she is aware  Risk of lymph node dissection include bleeding, infection, lymphedema, shoulder pain. stiffness, dye allergy. cosmetic deformity , blood clots, death, need for more surgery. Pt agres to proceed.  Current Plans Pt Education - [facility] STOP SMOKING! We discussed the staging and pathophysiology of breast cancer. We discussed all of the different options for treatment for breast cancer including surgery, chemotherapy, radiation therapy, Herceptin, and antiestrogen therapy. We discussed a sentinel lymph node biopsy as she does not appear to having lymph node involvement right now. We discussed the performance of that with injection of radioactive tracer and blue dye. We discussed that she would have an incision underneath her axillary hairline. We discussed that there is a bout a 10-20% chance of having a positive node with a sentinel lymph node biopsy and we will await the permanent pathology to make any other first further decisions in terms of her treatment. One of these options might be to return to the operating room to perform an axillary lymph node dissection. We discussed about a 1-2% risk lifetime of chronic shoulder pain as well as lymphedema associated with a sentinel lymph node biopsy. We discussed the options for treatment of the breast cancer which included lumpectomy versus a mastectomy. We discussed the performance of the lumpectomy with a wire placement. We discussed a  10-20% chance of a positive margin requiring reexcision in the operating room. We also discussed that she may need radiation therapy or antiestrogen therapy or both if she undergoes lumpectomy. We discussed the mastectomy and the postoperative care for that as well. We discussed that there is no difference in her survival whether she undergoes lumpectomy with radiation therapy or antiestrogen therapy versus a mastectomy. There is a slight difference in the local recurrence rate being 3-5% with lumpectomy and about 1% with a mastectomy. We discussed the risks of operation including bleeding, infection, possible reoperation. She understands her further therapy will be based on what her stages at the time of her operation.  Pt Education - [facility] breast cancer surgery: discussed with patient and provided information. Pt Education - ABC (After Breast Cancer) Class Info: discussed with patient and provided information. 

## 2017-02-17 NOTE — H&P (View-Only) (Signed)
[name] 02/17/2017 11:40 AM Location: [facility] Patient #: [id number] DOB: 45-Apr-1972 Single / Language: Cleophus Molt / Race: Black or African American Female  History of Present Illness Marcello Moores A. Hoa Deriso MD; 02/17/2017 12:03 PM) Patient words: Patient returns to recheck after right simple mastectomy with targeted right axillary sentinel lymph node mapping. Her final pathology showed a tumor size 7.2 cm. She did have 5 lymph nodes that showed residual metastatic carcinoma with extracapsular extension. She had concomitant reconstruction. She will require radiation therapy. She is in good spirits at this point in time.                         ADDITIONAL INFORMATION: 2. By immunohistochemistry, the tumor cells are equivocal for Her2 (2+). Claudette Laws MD Pathologist, Electronic Signature ( Signed 02/07/2017) 2. PROGNOSTIC INDICATORS Results: IMMUNOHISTOCHEMICAL AND MORPHOMETRIC ANALYSIS PERFORMED MANUALLY Estrogen Receptor: 100%, POSITIVE, STRONG STAINING INTENSITY Progesterone Receptor: 100%, POSITIVE, STRONG STAINING INTENSITY REFERENCE RANGE ESTROGEN RECEPTOR NEGATIVE 0% POSITIVE =>1% REFERENCE RANGE PROGESTERONE RECEPTOR NEGATIVE 0% POSITIVE =>1% All controls stained appropriately Thressa Sheller MD Pathologist, Electronic Signature ( Signed 02/04/2017) 2. FLUORESCENCE IN-SITU HYBRIDIZATION Results: HER2 - EQUIVOCAL. HER2-NEU BY IMMUNOHISTOCHEMISTRY WILL BE PERFORMED. 1 of 4 FINAL for TANE, BIEGLER D [id number]) ADDITIONAL INFORMATION:(continued) RATIO OF HER2/CEP17 SIGNALS 1.41 AVERAGE HER2 COPY NUMBER PER CELL 3.97 Reference Range: NEGATIVE HER2/CEP17 Ratio <2.0 and average HER2 copy number <4.0 EQUIVOCAL HER2/CEP17 Ratio <2.0 and average HER2 copy number >=4.0 and <6.0 POSITIVE HER2/CEP17 Ratio >=2.0 or <2.0 and average HER2 copy number >=6.0 Claudette Laws MD Pathologist, Electronic Signature ( Signed 02/04/2017) FINAL  DIAGNOSIS Diagnosis 1. 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 2. 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 - DUCTAL CARCINOMA IN SITU, HIGH GRADE. - LYMPHOVASCULAR INVASION IS IDENTIFIED, INCLUDING DERMAL LYMPHATICS. - PERINEURAL INVASION IS IDENTIFIED. - THE SURGICAL RESECTION MARGINS ARE NEGATIVE FOR CARCINOMA. - SEE ONCOLOGY TABLE BELOW. 3. Lymph nodes, regional resection, right axillary - METASTATIC CARCINOMA IN 2 OF 2 LYMPH NODES (2/2), WITH EXTRACAPSULAR EXTENSION. Microscopic Comment 2. BREAST, STATUS POST NEOADJUVANT TREATMENT Procedure: Simple mastectomy with axillary lymph node resections. Laterality: Right. Tumor Size: At least 7.2 cm. Histologic Type: Ductal. Grade: III. Tubular Differentiation: 3. Nuclear Pleomorphism: 2. Mitotic Count: 3. Ductal Carcinoma in Situ (DCIS): Present, high grade. Regional Lymph Nodes: Number of Lymph Nodes Examined: 5. Lymph Nodes with Macrometastases: 4. Lymph Nodes with Micrometastases: 0. Lymph Nodes with Isolated Tumor Cells: 0. Margins: Greater than 0.2 cm to all margins. Extent of Tumor: Involves the dermis of the nipple. Breast Prognostic Profile (pre-neoadjuvant case #: [id number]) Estrogen Receptor: 100%, strong. Progesterone Receptor: 95%, strong. 2 of 4 FINAL for ARILLA, HICE ([id number]) Microscopic Comment(continued) Her2: No amplification was detected. Ki-67: 20% - 30%. Will be repeated on the current case (Block #: 2A) and the results reported separately. Residual Cancer Burden (RCB): Primary Tumor Bed: 72 mm x 54 mm. Overall Cancer Cellularity: 60. Percentage of Cancer that is in Situ: 10. Number of Positive Lymph Nodes: 4. Diameter of Largest Lymph Node metastasis: 10 mm. Residual Cancer Burden: 4.3. Residual Cancer Burden Class: RCB-III. Pathologic  Stage Classification (p TNM, AJCC 8th Edition): Primary Tumor (ypT): ypT3. Regional Lymph Nodes (ypN): ypN2a. Comments: Grossly, the largest nodule is 4.2 cm. Immediately adjacent to this main nodule is a cluster of ill defined satellite nodules spanning 3.0  cm. Thus, the tumor overall spans 7.2 cm and is staged as such. There is only minimal treatment-related changes present in the tumor. (JBK:ah 01/31/17) Enid Cutter MD Pathologist, Electronic Signature (Case signed 01/31/2017) Specimen Gross and Clinical Information Specimen(s) Obtained: 1. Lymph nodes, regional resection, right axillary 2. Breast, simple mastectomy, right 3. Lymph nodes, regional resection, right axillary Specimen Clinical Information 1. right breast cancer (cm) Gross 1. The specimen is received fresh and consists of three tan-pink to red possible lymph nodes, ranging from 1.0 x 0.9 x 0.4 cm to 4.0 x 2.6 x 1.5 cm. Within the middle lymph node a radioactive seed is identified and removed per protocol. The largest possible lymph node is focally disrupted, and is serially sectioned to reveal a tan-red cut surface, which has been focally stained blue. Identified within the possible lymph node is a small silver metallic coil-shaped biopsy clip. Possible lymph nodes are entirely submitted in ten cassettes. A = one possible lymph node, serially sectioned. B, C = possible lymph node with seed, serially sectioned. D - J = largest possible lymph node with biopsy clip, serially sectioned. 2. Specimen: Received fresh and placed in formalin at 11:35 a.m. on 01/30/2017, and labeled right mastectomy. Specimen integrity (intact/disrupted): Intact, with a suture designating the superior aspect. Weight: 573 grams Size: 20.0 cm superior to inferior x 19.4 cm medial to lateral x 4.2 cm anterior to posterior. Skin: There is a 13.5 x 5.0 cm ellipse of tan skin, with a 1.5 x 1.4 cm brown partially retracted nipple. The skin  surface surrounding the nipple and areola is indurated. Tumor/cavity: Within the central portion of the breast, extending medially, there is a 4.2 x 3.2 x 2.7 cm tan-pink, firm, ill-defined lesion identified. The lesion extends to the anterior skin and nipple. Along the medial aspect of the lesion there is an area of yellow-red hemorrhage noted. The lesion measures 1.0 cm to the posterior margin. Along the lateral aspect of the lesion, several tan-pink, indurated, ill-defined possible satellite.  The patient is a 46 year old female.   Allergies (Tanisha A. Owens Shark, [place]; 02/17/2017 11:41 AM) Compazine *ANTIPSYCHOTICS/ANTIMANIC AGENTS* Swelling. Allergies Reconciled  Medication History (Tanisha A. Brown, RMA; 02/17/2017 11:41 AM) Albuterol Sulfate ((2.5 MG/3ML)0.083% Nebulized Soln, Inhalation) Active. LORazepam (0.5MG Tablet, Oral) Active. Fluconazole (200MG Tablet, Oral) Active. Norvasc (10MG Tablet, Oral) Active. Aspirin (81MG Tablet Chewable, Oral) Active. Nitroglycerin (0.4MG Tab Sublingual, Sublingual) Active. Mometasone Furoate (110MCG/INH Aero Pow Br Act, Inhalation) Active. Lipitor (80MG Tablet, Oral) Active. Plavix (75MG Tablet, Oral) Active. Metoprolol Succinate ER (100MG Tablet ER 24HR, Oral) Active. Losartan Potassium (50MG Tablet, Oral) Active. Medications Reconciled    Vitals (Tanisha A. Brown RMA; 02/17/2017 11:40 AM) 02/17/2017 11:40 AM Weight: 170.2 lb Height: 63in Body Surface Area: 1.81 m Body Mass Index: 30.15 kg/m  Temp.: 98.72F  Pulse: 109 (Regular)  BP: 138/84 (Sitting, Left Arm, Standard)      Physical Exam (Kartel Wolbert A. Destry Bezdek MD; 02/17/2017 12:03 PM)  Breast Note: Right breast incision is intact for mastectomy. There is some epidermal lysis in the medial part incision with no signs of infection. Drain is serous.    Assessment & Plan (Nelda Luckey A. Christin Mccreedy MD; 02/17/2017 12:04 PM)  POST-OPERATIVE STATE  [phone]) Impression: Case discussed at tumor board. She has 4/5 nodes with disease after TLND dissection and were positive for recurrent and persistent carcinoma. This is after chemotherapy. She will require radiation therapy due to her size and lymph node involvement but we feel that completion lymph node  dissection necessary given her case and given her young age and extent of disease with extracapsular extension. The other option is radiation alone but recurrence rate higher This will not change her survival and she is aware  Risk of lymph node dissection include bleeding, infection, lymphedema, shoulder pain. stiffness, dye allergy. cosmetic deformity , blood clots, death, need for more surgery. Pt agres to proceed.  Current Plans Pt Education - [facility] STOP SMOKING! We discussed the staging and pathophysiology of breast cancer. We discussed all of the different options for treatment for breast cancer including surgery, chemotherapy, radiation therapy, Herceptin, and antiestrogen therapy. We discussed a sentinel lymph node biopsy as she does not appear to having lymph node involvement right now. We discussed the performance of that with injection of radioactive tracer and blue dye. We discussed that she would have an incision underneath her axillary hairline. We discussed that there is a bout a 10-20% chance of having a positive node with a sentinel lymph node biopsy and we will await the permanent pathology to make any other first further decisions in terms of her treatment. One of these options might be to return to the operating room to perform an axillary lymph node dissection. We discussed about a 1-2% risk lifetime of chronic shoulder pain as well as lymphedema associated with a sentinel lymph node biopsy. We discussed the options for treatment of the breast cancer which included lumpectomy versus a mastectomy. We discussed the performance of the lumpectomy with a wire placement. We discussed a  10-20% chance of a positive margin requiring reexcision in the operating room. We also discussed that she may need radiation therapy or antiestrogen therapy or both if she undergoes lumpectomy. We discussed the mastectomy and the postoperative care for that as well. We discussed that there is no difference in her survival whether she undergoes lumpectomy with radiation therapy or antiestrogen therapy versus a mastectomy. There is a slight difference in the local recurrence rate being 3-5% with lumpectomy and about 1% with a mastectomy. We discussed the risks of operation including bleeding, infection, possible reoperation. She understands her further therapy will be based on what her stages at the time of her operation.  Pt Education - flb breast cancer surgery: discussed with patient and provided information. Pt Education - ABC (After Breast Cancer) Class Info: discussed with patient and provided information.

## 2017-02-28 NOTE — Pre-Procedure Instructions (Addendum)
JARYN HOCUTT  02/28/2017      [id number] [name], [place] - [address] [place] [address] Phone: [phone] Fax: [phone]    Your procedure is scheduled on Thurs. Dec. 13.  Report to [facility] Admitting at 11:00 A.M.  Call this number if you have problems the morning of surgery:  [phone]   Remember:  Do not eat food or drink liquids after midnight on Wed. Dec. 12             DRINK ENSURE BEFORE LEAVING THE HOUSE DAY OF SURGERY    Take these medicines the morning of surgery with A SIP OF WATER : tylenol if needed,albuterol if needed-bring to hospital,amlodipine (norvasc), isosorbide (imdur), metoprolol (toprol XL), dulera-bring to hospital, proair if needed-bring to hospital, venlafaxine (effexor-XR)             7 days prior to surgery STOP taking any Aspirin(unless otherwise instructed by your surgeon), Aleve, Naproxen, Ibuprofen, Motrin, Advil, Goody's, BC's, all herbal medications, fish oil, and all vitamins              STOP PLAVIX PER DR. Brantley Stage   Do not wear jewelry, make-up or nail polish.  Do not wear lotions, powders, or perfumes, or deoderant.  Do not shave 48 hours prior to surgery.  Men may shave face and neck.  Do not bring valuables to the hospital.  [facility] is not responsible for any belongings or valuables.  Contacts, dentures or bridgework may not be worn into surgery.  Leave your suitcase in the car.  After surgery it may be brought to your room.  For patients admitted to the hospital, discharge time will be determined by your treatment team.  Patients discharged the day of surgery will not be allowed to drive home.    Special instructions:  [place]- Preparing For Surgery  Before surgery, you can play an important role. Because skin is not sterile, your skin needs to be as free of germs as possible. You can reduce the number of germs on your skin by washing with CHG  (chlorahexidine gluconate) Soap before surgery.  CHG is an antiseptic cleaner which kills germs and bonds with the skin to continue killing germs even after washing.  Please do not use if you have an allergy to CHG or antibacterial soaps. If your skin becomes reddened/irritated stop using the CHG.  Do not shave (including legs and underarms) for at least 48 hours prior to first CHG shower. It is OK to shave your face.  Please follow these instructions carefully.   1. Shower the NIGHT BEFORE SURGERY and the MORNING OF SURGERY with CHG.   2. If you chose to wash your hair, wash your hair first as usual with your normal shampoo.  3. After you shampoo, rinse your hair and body thoroughly to remove the shampoo.  4. Use CHG as you would any other liquid soap. You can apply CHG directly to the skin and wash gently with a scrungie or a clean washcloth.   5. Apply the CHG Soap to your body ONLY FROM THE NECK DOWN.  Do not use on open wounds or open sores. Avoid contact with your eyes, ears, mouth and genitals (private parts). Wash Face and genitals (private parts)  with your normal soap.  6. Wash thoroughly, paying special attention to the area where your surgery will be performed.  7. Thoroughly rinse your body with warm  water from the neck down.  8. DO NOT shower/wash with your normal soap after using and rinsing off the CHG Soap.  9. Pat yourself dry with a CLEAN TOWEL.  10. Wear CLEAN PAJAMAS to bed the night before surgery, wear comfortable clothes the morning of surgery  11. Place CLEAN SHEETS on your bed the night of your first shower and DO NOT SLEEP WITH PETS.    Day of Surgery: Do not apply any deodorants/lotions. Please wear clean clothes to the hospital/surgery center.      Please read over the following fact sheets that you were given. Coughing and Deep Breathing and Surgical Site Infection Prevention

## 2017-03-03 ENCOUNTER — Encounter (HOSPITAL_COMMUNITY): Payer: Self-pay

## 2017-03-03 ENCOUNTER — Inpatient Hospital Stay (HOSPITAL_COMMUNITY)
Admission: RE | Admit: 2017-03-03 | Discharge: 2017-03-03 | Disposition: A | Discharge status: Home / Self Care | Payer: Self-pay | Source: Ambulatory Visit

## 2017-03-03 ENCOUNTER — Other Ambulatory Visit: Payer: Self-pay

## 2017-03-03 NOTE — Progress Notes (Signed)
PCP - Dr. Alphonzo Grieve  Cardiologist - Dr. Shelva Majestic  Chest x-ray - 04/12/16 (E)  EKG - 12/24/16 (E)  Stress Test - 12/30/16 (E)  ECHO - 08/2015 (E)  Cardiac Cath - 08/2015 (E)  Sleep Study - Yes CPAP - None  LABS- 03/06/17: CBC & BMP  Pt sts she has been off of her Aspirin x 2 weeks and Plavix x 1 month. Chart will be given to anesthesia for review due to cardiac history.    Pt denies having chest pain, sob, or fever at this time. All instructions explained to the pt, with a verbal understanding. The opportunity to ask questions was provided.

## 2017-03-04 ENCOUNTER — Ambulatory Visit (HOSPITAL_COMMUNITY): Payer: Self-pay

## 2017-03-04 ENCOUNTER — Encounter (HOSPITAL_COMMUNITY): Payer: Self-pay | Admitting: Vascular Surgery

## 2017-03-04 ENCOUNTER — Encounter: Payer: Self-pay | Admitting: *Deleted

## 2017-03-04 ENCOUNTER — Ambulatory Visit: Payer: Self-pay

## 2017-03-04 ENCOUNTER — Ambulatory Visit
Admission: RE | Admit: 2017-03-04 | Discharge: 2017-03-04 | Disposition: A | Discharge status: Home / Self Care | Payer: Medicaid Other | Source: Ambulatory Visit | Attending: Radiation Oncology | Admitting: Radiation Oncology

## 2017-03-04 NOTE — Progress Notes (Signed)
[name] left a message to call Dr. [name] office about her 0900 appointment with the nurse [name] and 0930 with  [name], PA-C.  Mentioned that we have an opening today at 3 p.m. or we can get her rescheduled for a later date call me back at [phone] and ask for [name].

## 2017-03-04 NOTE — Progress Notes (Signed)
Anesthesia Chart Review:  Pt is a same day work up.   Pt is a 46 year old female scheduled for R axillary lymph node dissection on 03/06/2017 with Erroll Luna, MD  - PCP is Lillia Corporal, MD - Cardiologist is Shelva Majestic, MD, last office visit 01/02/17 with Bernerd Pho, [place]    PMH includes: CAD (NSTEMI 08/2015), renal artery stenosis (s/p stent 08/23/14), HTN OSA, asthma, lymphoma, asthma, GERD. Current smoker. BMI 30.  - S/p R mastectomy, R lymph node excision, immediate breast reconstruction 01/30/17.   Medications include: Albuterol, amlodipine, ASA 81 mg, Lipitor, plavix, Imdur, losartan, metoprolol, Dulera  Labs to obtained day of surgery.   CXR 04/12/16: No acute finding. Chronic bronchitic markings.  EKG 12/24/16: NSR. Rightward axis. Cannot rule out inferior infarct, age undetermined. Anterior infarct, age undetermined. T-wave abnormality, consider lateral ischemia.  Nuclear stress test 12/30/16:   Nuclear stress EF: 43%.  There was no ST segment deviation noted during stress.  Defect 1: There is a medium defect of severe severity present in the apical anterior, apical inferior and apex location.  This is an intermediate risk study.  The left ventricular ejection fraction is moderately decreased (30-44%). - Intermediate risk stress nuclear study with prior apical infarct and very mild peri-infarct ischemia; EF 43 with apical akinesis; study intermediate risk due to reduced LV function; note increased uptake right breast concerning for breast mass. - Dr. Claiborne Billings reviewed results. Per [place] Strader's note 01/02/17 "EF likely falsely low as common to occur on stress test. Can consider a repeat echo following surgery. With only mild peri-infarct ischemia, would not pursue further ischemic evaluation at this time. She is of acceptable risk to proceed with the above procedure." - Note this was in reference to 01/30/17 mastectomy and reconstruction  Cardiac cath  09/18/15: 1. Mid LAD to Dist LAD lesion, 65% stenosed. This is in a tortuous segment with several hinge points. Focal areas may be more significant, however the remainder the area looks relatively okay. 2. Mid LAD lesion, 45% stenosed. 3. Ost 1st Diag to 1st Diag lesion, 50% stenosed. 4. Known mild to moderately reduced EF by Echo today.  Echo 09/18/15:  - Left ventricle: The cavity size was normal. There was moderate concentric hypertrophy. Systolic function was normal. The estimated ejection fraction was 55%. Wall motion was normal; there were no regional wall motion abnormalities. The study is not technically sufficient to allow evaluation of LV diastolic function. - Aortic valve: Trileaflet; normal thickness leaflets. Transvalvular velocity was within the normal range. There was no stenosis. There was no regurgitation. - Aortic root: The aortic root was normal in size. - Mitral valve: There was moderate regurgitation directed centrally. Valve area by pressure half-time: 1.8 cm^2. - Right ventricle: Systolic function was normal. - Tricuspid valve: There was mild regurgitation. - Pulmonary arteries: Systolic pressure was mildly increased. PA peak pressure: 37 mm Hg (S). - Inferior vena cava: The vessel was normal in size. - Pericardium, extracardiac: There was no pericardial effusion.  If no changes, I anticipate pt can proceed with surgery as scheduled.   Willeen Cass, FNP-BC [facility] Short Stay Surgical Center/Anesthesiology Phone: [phone] 03/04/2017 12:06 PM

## 2017-03-05 ENCOUNTER — Telehealth: Payer: Self-pay | Admitting: Radiation Oncology

## 2017-03-05 ENCOUNTER — Ambulatory Visit: Payer: Medicaid Other

## 2017-03-05 ENCOUNTER — Ambulatory Visit: Payer: Medicaid Other | Admitting: Radiation Oncology

## 2017-03-05 ENCOUNTER — Encounter: Payer: Self-pay | Admitting: Radiation Oncology

## 2017-03-05 ENCOUNTER — Ambulatory Visit: Admission: RE | Admit: 2017-03-05 | Payer: Medicaid Other | Source: Ambulatory Visit | Admitting: Radiation Oncology

## 2017-03-05 HISTORY — DX: Encounter for other screening for genetic and chromosomal anomalies: Z13.79

## 2017-03-05 NOTE — Progress Notes (Signed)
[place] left a message to call Dr. [name] office about her 0900 appointment with the nurse Trevell Pariseau and 0930 with  [name], PA-C.  Mentioned that we have an opening today at 3 p.m. or we can get her rescheduled for a later date call me back at [phone] and ask for Jamila Slatten.

## 2017-03-05 NOTE — Progress Notes (Signed)
Location of Breast Cancer:Right breast  Histology per Pathology Report: Diagnosis 01-30-17 1. 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 2. 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 - DUCTAL CARCINOMA IN SITU, HIGH GRADE. - LYMPHOVASCULAR INVASION IS IDENTIFIED, INCLUDING DERMAL LYMPHATICS. - PERINEURAL INVASION IS IDENTIFIED. - THE SURGICAL RESECTION MARGINS ARE NEGATIVE FOR CARCINOMA. - SEE ONCOLOGY TABLE BELOW. 3. Lymph nodes, regional resection, right axillary - METASTATIC CARCINOMA IN 2 OF 2 LYMPH NODES (2/2), WITH EXTRACAPSULAR EXTENSION.  Receptor Status: ER(100% +), PR (100% +), Her2-neu (), Ki-()  Right axillary Receptor Status: ER(100% +), PR (95% +), Her2-neu (-), Ki-(20%-30%)  Right mastectomy  Diagnosis 07-18-16 1. Breast, right, needle core biopsy, UIQ retroareolar - INVASIVE DUCTAL CARCINOMA, GRADE III. - LYMPH VASCULAR INVOLVEMENT BY TUMOR. 2. Breast, right, needle core biopsy, near 9:00 o'clock UOQ - INVASIVE DUCTAL CARCINOMA, GRADE III. - SEE MICROSCOPIC DESCRIPTION. 3. Lymph node, needle/core biopsy, right axillary - METASTATIC CARCINOMA.  1. FLUORESCENCE IN-SITU HYBRIDIZATION Results: HER2 - NEGATIVE Receptor Status: ER(100% +), PR (95% +), Her2-neu (-), Ki-(20%)   1. PROGNOSTIC INDICATORS   Receptor Status: ER(100% +), PR (95% +), Her2-neu (-), Ki-(30%)    2. PROGNOSTIC INDICATORS  Receptor Status: ER(100% +), PR (95% +), Her2-neu (-), Ki-(30%)    3. PROGNOSTIC INDICATORS  Did patient present with symptoms (if so, please note symptoms) or was this found on screening mammography?: Palpable right breast masses for 1 year had diagnostic screening.  Past/Anticipated interventions by surgeon, if any Dr. Erroll Luna 03-06-17 Scheduled for Right axillary lymph node dissection, 01-30-17 Lymph nodes, regional  resection, right axillary, 07-18-16 Breast, right, needle core biopsy, UIQ retroareolar, Lymph node, needle/core biopsy, right axillary Breast, simple mastectomy, right   Past/Anticipated interventions by medical oncology, if any:     01-30-17   Adjuvant radiation therapy,antiestrogen therapy with tamoxifen (consideration for Natalee clinical trial)                                                                                                    01-06-17 cardiology appointment due to cardiac issues  needs cardiac clearance for surgery                                                                                                                    Dr. Lindi Adie 01-01-17  Start tamoxifen 20 mg daily having worse right breast pain  08-13-16 Genetic testing                                                                                                                    Chemotherapy yes  08-09-16 til 10-11-16 Taxotere and Cytoxan 3 weeks x 4 cycles.     Lymphedema issues, if any:  No  Pain issues, if any: No  SAFETY ISSUES:  Prior radiation? : No  Pacemaker/ICD? : No  Possible current pregnancy?: No  Is the patient on methotrexate? : No  Menarche    G 4 P 4  BC 29 years  Menopause 10 years   HRT No  Current Complaints / other details:  Treated for lymphoma in 2000 with [facility] chemotherapy, family history of breast cancer maternal aunt and mother cervical cancer. Wt Readings from Last 3 Encounters:  03/06/17 170 lb 9.6 oz (77.4 kg)  02/11/17 169 lb 8 oz (76.9 kg)  01/30/17 169 lb 8 oz (76.9 kg)  BP 126/80 (BP Location: Left Arm, Patient Position: Sitting, Cuff Size: Normal)   Pulse 83   Temp 98.6 F (37 C) (Oral)   Resp 18   Ht '5\' 3"'  (1.6 m)   Wt 170 lb 9.6 oz (77.4 kg)   SpO2 99%   BMI 30.22 kg/m     Georgena Spurling, RN 03/05/2017,4:49 PM

## 2017-03-05 NOTE — Telephone Encounter (Signed)
LM for pt to call back to reschedule her follow up appointment to see Dr. Lisbeth Renshaw and I.

## 2017-03-06 ENCOUNTER — Telehealth: Payer: Self-pay | Admitting: Hematology and Oncology

## 2017-03-06 ENCOUNTER — Encounter: Payer: Self-pay | Admitting: *Deleted

## 2017-03-06 ENCOUNTER — Ambulatory Visit
Admission: RE | Admit: 2017-03-06 | Discharge: 2017-03-06 | Disposition: A | Discharge status: Home / Self Care | Payer: Medicaid Other | Source: Ambulatory Visit | Attending: Radiation Oncology | Admitting: Radiation Oncology

## 2017-03-06 ENCOUNTER — Ambulatory Visit: Payer: Medicaid Other | Admitting: Radiation Oncology

## 2017-03-06 ENCOUNTER — Encounter: Payer: Self-pay | Admitting: Radiation Oncology

## 2017-03-06 ENCOUNTER — Ambulatory Visit (HOSPITAL_COMMUNITY): Admission: RE | Admit: 2017-03-06 | Payer: Self-pay | Source: Ambulatory Visit | Admitting: Surgery

## 2017-03-06 ENCOUNTER — Other Ambulatory Visit: Payer: Self-pay

## 2017-03-06 VITALS — BP 126/80 | HR 83 | Temp 98.6°F | Resp 18 | Ht 63.0 in | Wt 170.6 lb

## 2017-03-06 DIAGNOSIS — C50811 Malignant neoplasm of overlapping sites of right female breast: Secondary | ICD-10-CM | POA: Insufficient documentation

## 2017-03-06 DIAGNOSIS — I252 Old myocardial infarction: Secondary | ICD-10-CM | POA: Insufficient documentation

## 2017-03-06 DIAGNOSIS — I10 Essential (primary) hypertension: Secondary | ICD-10-CM | POA: Insufficient documentation

## 2017-03-06 DIAGNOSIS — Z8041 Family history of malignant neoplasm of ovary: Secondary | ICD-10-CM | POA: Diagnosis not present

## 2017-03-06 DIAGNOSIS — F17210 Nicotine dependence, cigarettes, uncomplicated: Secondary | ICD-10-CM | POA: Insufficient documentation

## 2017-03-06 DIAGNOSIS — I2510 Atherosclerotic heart disease of native coronary artery without angina pectoris: Secondary | ICD-10-CM | POA: Insufficient documentation

## 2017-03-06 DIAGNOSIS — Z803 Family history of malignant neoplasm of breast: Secondary | ICD-10-CM | POA: Diagnosis not present

## 2017-03-06 DIAGNOSIS — Z79899 Other long term (current) drug therapy: Secondary | ICD-10-CM | POA: Insufficient documentation

## 2017-03-06 DIAGNOSIS — Z8042 Family history of malignant neoplasm of prostate: Secondary | ICD-10-CM | POA: Diagnosis not present

## 2017-03-06 DIAGNOSIS — I517 Cardiomegaly: Secondary | ICD-10-CM | POA: Insufficient documentation

## 2017-03-06 DIAGNOSIS — Z170 Estrogen receptor positive status [ER+]: Secondary | ICD-10-CM

## 2017-03-06 DIAGNOSIS — G510 Bell's palsy: Secondary | ICD-10-CM | POA: Diagnosis not present

## 2017-03-06 DIAGNOSIS — I701 Atherosclerosis of renal artery: Secondary | ICD-10-CM | POA: Diagnosis not present

## 2017-03-06 DIAGNOSIS — J45909 Unspecified asthma, uncomplicated: Secondary | ICD-10-CM | POA: Insufficient documentation

## 2017-03-06 DIAGNOSIS — Z510 Encounter for antineoplastic radiation therapy: Secondary | ICD-10-CM | POA: Insufficient documentation

## 2017-03-06 DIAGNOSIS — K219 Gastro-esophageal reflux disease without esophagitis: Secondary | ICD-10-CM | POA: Insufficient documentation

## 2017-03-06 DIAGNOSIS — G4730 Sleep apnea, unspecified: Secondary | ICD-10-CM | POA: Diagnosis not present

## 2017-03-06 SURGERY — LYMPHADENECTOMY, AXILLARY
Anesthesia: General | Laterality: Right

## 2017-03-06 NOTE — Addendum Note (Signed)
Encounter addended by: Malena Edman, RN on: 03/06/2017 1:05 PM  Actions taken: Charge Capture section accepted

## 2017-03-06 NOTE — Telephone Encounter (Signed)
Confirmed new PET scan appointment with patient

## 2017-03-06 NOTE — Progress Notes (Signed)
Radiation Oncology         [phone] ________________________________  Name: [name] MRN: [id number]  Date: 03/06/2017  DOB: 10/29/1970  LK:[id number], Kimberly Bathe, MD  Nicholas Lose, MD     REFERRING PHYSICIAN: Nicholas Lose, MD   DIAGNOSIS: The encounter diagnosis was Malignant neoplasm of overlapping sites of right breast in female, estrogen receptor positive ([place]).   HISTORY OF PRESENT ILLNESS: [name] is a 46 y.o. female  originally seen in the multidisciplinary breast clinic for a new diagnosis of right breast cancer. 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 Su Grand. She's been NED from lymphoma since her treatments.  The patient was found to have a palpable mass that she noticed for about a year, she sought care and was supposed to be scheduled for mammography in the fall. She did not have this performed however until 07/12/16 when two masses were identified. Diagnostic imaging revealed a mass at 1:00 measuring 4.2 x 3.1 x 2.1 cm, and satellite nodules about 4.1 cm away from the mass at 9:00. 2 lymph nodes were suspicious in the axilla, and a biopsy of the primary mass, satellite nodule, and lymph node were performed on 07/18/16. All biopsies revealed grade 3 invasive ductal carcinoma, ER positive, HER-2 negative Ki-67 of 20-30%.    She was counseled on the role for neoadjuvant chemotherapy and genetic counseling.  She was found to have a variant of undetermined significance within the BRCA1 gene when she was tested on 08/22/2016.  She completed her chemotherapy between May and July 2018 with Taxotere and Cytoxan x4 cycles.  She subsequently transitioned to antiestrogen with tamoxifen daily, and has undergone surgical resection on 01/30/2017 she had right mastectomy and targeted lymph node assessment.  Her final tumor measurement was 7.2 cm, and the  tumor was a grade 3 invasive ductal carcinoma with high-grade DCIS, LV SI and dermal lymphatics were involved as well as perineural invasion.  Her margins were negative, 4 out of 10 lymph nodes were positive with extracapsular extension.  Her tumor remained ER and PR positive, HER-2 negative.  She comes today to discuss the role of adjuvant radiotherapy to the chest wall and regional lymphatics. She is awaiting the scheduling of her right axillary dissection.    PREVIOUS RADIATION THERAPY: No   PAST MEDICAL HISTORY:  Past Medical History:  Diagnosis Date  . Asthma    beginning of 2018 - came to ER    . Bell's palsy   . Breast cancer ([place]) 01/2017  . Cardiomegaly   . Coronary artery disease    NSTEMI 08/2015 (65% LAD; D1 50%)  . Family history of breast cancer   . Family history of prostate cancer   . 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 GERD (gastroesophageal reflux disease)   . Hypertension   . 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 OSA (obstructive sleep apnea)    negative results  . Renal artery stenosis ([facility])    s/p stent placment to left        PAST SURGICAL HISTORY: Past Surgical History:  Procedure Laterality Date  . BREAST RECONSTRUCTION WITH PLACEMENT OF TISSUE EXPANDER AND FLEX HD (ACELLULAR HYDRATED DERMIS) Right 01/30/2017   Procedure: RIGHT BREAST RECONSTRUCTION WITH PLACEMENT OF TISSUE EXPANDER AND FLEX HD (ACELLULAR HYDRATED DERMIS);  Surgeon: Wallace Going, DO;  Location: [facility];  Service: Plastics;  Laterality: Right;  . BREAST SURGERY     breast bx  07/2016  . CARDIAC CATHETERIZATION N/A 09/18/2015   Procedure: Left Heart Cath and Coronary Angiography;  Surgeon: Leonie Man, MD;  Location: [facility];  Service: Cardiovascular;  Laterality: N/A;  . [place]   x2  . IR CV LINE INJECTION  07/24/2016  . MASTECTOMY WITH RADIOACTIVE SEED GUIDED EXCISION AND AXILLARY SENTINEL LYMPH NODE BIOPSY Right 01/30/2017    Procedure: RIGHT SIMPLE MASTECTOMY WITH  RADIOACTIVE SEED TARGETED RIGHT AXILLARY LYMPH NODE EXCISION AND RIGHT AXILLARY SENTINEL LYMPH NODE BIOPSY;  Surgeon: Erroll Luna, MD;  Location: [facility];  Service: General;  Laterality: Right;  . PERIPHERAL VASCULAR CATHETERIZATION N/A 08/23/2014   Procedure: Renal Angiography;  Surgeon: Adrian Prows, MD;  Location: [facility];  Service: Cardiovascular;  Laterality: N/A;  . RENAL ARTERY STENT Left 08/23/2014  . SIMPLE MASTECTOMY Right 01/30/2017  . SKIN BIOPSY  2000  . TUBAL LIGATION       FAMILY HISTORY:  Family History  Problem Relation Age of Onset  . Hypertension Mother   . Cervical cancer Mother        hysterectomy with ?metastatic disease  . Coronary artery disease Father 88       CABG  . Asthma Father   . Breast cancer Maternal Aunt        age at diagnosis unknown  . Prostate cancer Cousin        paternal first cousin  . Migraines Neg Hx      SOCIAL HISTORY:  reports that she has been smoking cigarettes.  She has a 7.50 pack-year smoking history. she has never used smokeless tobacco. She reports that she does not drink alcohol or use drugs. The patient is single in [place]. She is single and relocated from [place] in 2009.   ALLERGIES: Compazine [prochlorperazine] and Ondansetron hcl   MEDICATIONS:  Current Outpatient Medications  Medication Sig Dispense Refill  . acetaminophen (TYLENOL) 500 MG tablet Take 1,000 mg by mouth every 8 (eight) hours as needed for mild pain or headache.    . albuterol (PROVENTIL) (2.5 MG/3ML) 0.083% nebulizer solution Take 3 mLs (2.5 mg total) by nebulization every 6 (six) hours as needed for wheezing or shortness of breath. 75 mL 12  . amLODipine (NORVASC) 10 MG tablet Take 10 mg by mouth daily.    Marland Kitchen atorvastatin (LIPITOR) 80 MG tablet Take 1 tablet (80 mg total) by mouth daily at 6 PM. 30 tablet 0  . HYDROcodone-acetaminophen (NORCO/VICODIN) 5-325 MG tablet Take 1-2 tablets every 4 (four)  hours as needed by mouth for moderate pain. 30 tablet 0  . isosorbide mononitrate (IMDUR) 60 MG 24 hr tablet Take 1 tablet (60 mg total) by mouth daily. 90 tablet 3  . losartan (COZAAR) 50 MG tablet Take 50 mg by mouth daily.  3  . metoprolol succinate (TOPROL XL) 100 MG 24 hr tablet Take 1 tablet (100 mg total) by mouth daily. Take with or immediately following a meal. 30 tablet 0  . mometasone-formoterol (DULERA) 200-5 MCG/ACT AERO Inhale 2 puffs into the lungs 2 (two) times daily. 1 Inhaler 1  . PROAIR HFA 108 (90 Base) MCG/ACT inhaler Inhale 2 puffs into the lungs every 6 (six) hours as needed for wheezing or shortness of breath.   3  . tamoxifen (NOLVADEX) 20 MG tablet Take 1  tablet (20 mg total) by mouth daily. 90 tablet 3  . venlafaxine XR (EFFEXOR-XR) 37.5 MG 24 hr capsule Take 1 capsule (37.5 mg total) by mouth daily with breakfast. 30 capsule 6  . aspirin 81 MG chewable tablet Chew 81 mg by mouth daily.    . betamethasone valerate ointment (VALISONE) 0.1 % Apply 1 application topically 2 (two) times daily. (Patient not taking: Reported on 02/21/2017) 45 g 1  . clopidogrel (PLAVIX) 75 MG tablet Take 75 mg by mouth daily.    . diazepam (VALIUM) 2 MG tablet Take 1 tablet (2 mg total) every 8 (eight) hours as needed by mouth for muscle spasms. (Patient not taking: Reported on 02/21/2017) 30 tablet 0  . diphenhydrAMINE (BENADRYL) 12.5 MG/5ML elixir Take 5 mLs (12.5 mg total) every 6 (six) hours as needed by mouth for itching. (Patient not taking: Reported on 02/21/2017) 120 mL 0  . nitroGLYCERIN (NITROSTAT) 0.4 MG SL tablet Place 0.4 mg under the tongue every 5 (five) minutes as needed for chest pain.    . polyethylene glycol (MIRALAX / GLYCOLAX) packet Take 17 g daily as needed by mouth for mild constipation. (Patient not taking: Reported on 03/06/2017) 14 each 0  . senna (SENOKOT) 8.6 MG TABS tablet Take 1 tablet (8.6 mg total) 2 (two) times daily by mouth. (Patient not taking: Reported on  02/21/2017) 120 each 0   No current facility-administered medications for this encounter.      REVIEW OF SYSTEMS: On review of systems, the patient reports that she is doing well overall. She denies any chest pain, shortness of breath, cough, fevers, chills, night sweats, unintended weight changes. She denies any bowel or bladder disturbances, and denies abdominal pain, nausea or vomiting. She denies any new musculoskeletal or joint aches or pains. A complete review of systems is obtained and is otherwise negative.     PHYSICAL EXAM:  Wt Readings from Last 3 Encounters:  03/06/17 170 lb 9.6 oz (77.4 kg)  02/11/17 169 lb 8 oz (76.9 kg)  01/30/17 169 lb 8 oz (76.9 kg)   Temp Readings from Last 3 Encounters:  03/06/17 98.6 F (37 C) (Oral)  02/11/17 98.3 F (36.8 C) (Oral)  01/31/17 98.8 F (37.1 C) (Oral)   BP Readings from Last 3 Encounters:  03/06/17 126/80  02/11/17 (!) 145/79  01/31/17 (!) 151/81   Pulse Readings from Last 3 Encounters:  03/06/17 83  02/11/17 67  01/31/17 71     In general this is a well appearing African-American female in in no acute distress. She is alert and oriented x4 and appropriate throughout the examination. HEENT reveals that the patient is normocephalic, atraumatic. EOMs are intact. PERRLA. Cardiopulmonary assessment is negative for acute distress and she exhibits normal effort. Breast exam is deferred due to her upcoming surgery.    ECOG = 0  0 - Asymptomatic (Fully active, able to carry on all predisease activities without restriction)  1 - Symptomatic but completely ambulatory (Restricted in physically strenuous activity but ambulatory and able to carry out work of a light or sedentary nature. For example, light housework, office work)  2 - Symptomatic, <50% in bed during the day (Ambulatory and capable of all self care but unable to carry out any work activities. Up and about more than 50% of waking hours)  3 - Symptomatic, >50% in  bed, but not bedbound (Capable of only limited self-care, confined to bed or chair 50% or more of waking hours)  4 - Bedbound (  Completely disabled. Cannot carry on any self-care. Totally confined to bed or chair)  5 - Death   Eustace Pen MM, Creech RH, Tormey DC, et al. [phone]). "Toxicity and response criteria of the [facility]". [place] Oncol. 5 (6): 649-55    LABORATORY DATA:  Lab Results  Component Value Date   WBC 11.7 (H) 01/23/2017   HGB 14.3 01/23/2017   HCT 42.1 01/23/2017   MCV 90.0 01/23/2017   PLT 247 01/23/2017   Lab Results  Component Value Date   NA 140 01/23/2017   K 3.4 (L) 01/23/2017   CL 107 01/23/2017   CO2 23 01/23/2017   Lab Results  Component Value Date   ALT 13 (L) 12/19/2016   AST 18 12/19/2016   ALKPHOS 99 12/19/2016   BILITOT 0.3 12/19/2016      RADIOGRAPHY: No results found.     IMPRESSION/PLAN: 1. Stage IIB, T2, N1, Mx grade 3, ER/PR positive invasive ductal carcinoma of the right breast. Dr. Lisbeth Renshaw discusses the pathology findings and reviews the nature of locally advanced disease, and the persistent disease seen at the time of her surgery. He agrees with the patient proceeding to surgery for axillary dissection given her positive nodal status at the time of her surgery in November. We will follow up with her progress once surgery is performed and see her back 2-3 weeks postoperatively and coordinate simulation to follow during that visit. We discussed the risks, benefits, short, and long term effects of radiotherapy, and the patient is interested in proceeding. Dr. Lisbeth Renshaw discusses the delivery and logistics of radiotherapy, and recommends a course of 6 1/2 weeks of radiotherapy to the chest wall and regional nodes. Written consent is obtained and placed in the chart, a copy was provided to the patient.  In a visit lasting 25 minutes, greater than 50% of the time was spent face to face discussing her pathology and specifically  her nodal status, and coordinating the patient's care.  The above documentation reflects my direct findings during this shared patient visit. Please see the separate note by Dr. Lisbeth Renshaw on this date for the remainder of the patient's plan of care.    Carola Rhine, PAC

## 2017-03-06 NOTE — Progress Notes (Signed)
[place] no answer left a message to call about her appointment today at 1000 with [facility]. and 1030 with [name], PA-C and Dr. Lisbeth Renshaw.  Asked to call back to 336 [phone] and ask for [name].

## 2017-03-10 ENCOUNTER — Ambulatory Visit: Payer: Self-pay | Admitting: Surgery

## 2017-03-12 ENCOUNTER — Other Ambulatory Visit: Payer: Self-pay

## 2017-03-12 ENCOUNTER — Encounter (HOSPITAL_COMMUNITY): Payer: Self-pay | Admitting: *Deleted

## 2017-03-12 MED ORDER — DEXTROSE 5 % IV SOLN
3.0000 g | INTRAVENOUS | Status: AC
  Administered 2017-03-13: 2 g via INTRAVENOUS
  Filled 2017-03-12: qty 3

## 2017-03-12 NOTE — Progress Notes (Signed)
Spoke with pt for pre-op call. Pt has hx of CAD. Has cardiac clearance noted in Epic on 01/02/17. Pt states she stopped her Aspirin and Plavix more than a week ago. Pt denies any recent chest pain or sob.

## 2017-03-13 ENCOUNTER — Ambulatory Visit (HOSPITAL_COMMUNITY)
Admission: RE | Admit: 2017-03-13 | Discharge: 2017-03-13 | Disposition: A | Discharge status: Home / Self Care | Payer: Medicaid Other | Source: Ambulatory Visit | Attending: Surgery | Admitting: Surgery

## 2017-03-13 ENCOUNTER — Ambulatory Visit (HOSPITAL_COMMUNITY): Payer: Medicaid Other | Admitting: Anesthesiology

## 2017-03-13 ENCOUNTER — Encounter (HOSPITAL_COMMUNITY): Payer: Self-pay | Admitting: *Deleted

## 2017-03-13 ENCOUNTER — Encounter (HOSPITAL_COMMUNITY)
Admission: RE | Disposition: A | Discharge status: Home / Self Care | Payer: Self-pay | Source: Ambulatory Visit | Attending: Surgery

## 2017-03-13 ENCOUNTER — Ambulatory Visit (HOSPITAL_COMMUNITY): Payer: Medicaid Other

## 2017-03-13 ENCOUNTER — Telehealth: Payer: Self-pay | Admitting: Adult Health

## 2017-03-13 DIAGNOSIS — F17210 Nicotine dependence, cigarettes, uncomplicated: Secondary | ICD-10-CM | POA: Insufficient documentation

## 2017-03-13 DIAGNOSIS — I2510 Atherosclerotic heart disease of native coronary artery without angina pectoris: Secondary | ICD-10-CM | POA: Diagnosis not present

## 2017-03-13 DIAGNOSIS — I701 Atherosclerosis of renal artery: Secondary | ICD-10-CM | POA: Insufficient documentation

## 2017-03-13 DIAGNOSIS — Z9011 Acquired absence of right breast and nipple: Secondary | ICD-10-CM | POA: Insufficient documentation

## 2017-03-13 DIAGNOSIS — I10 Essential (primary) hypertension: Secondary | ICD-10-CM | POA: Diagnosis not present

## 2017-03-13 DIAGNOSIS — I252 Old myocardial infarction: Secondary | ICD-10-CM | POA: Insufficient documentation

## 2017-03-13 DIAGNOSIS — Z8572 Personal history of non-Hodgkin lymphomas: Secondary | ICD-10-CM | POA: Insufficient documentation

## 2017-03-13 DIAGNOSIS — Z8042 Family history of malignant neoplasm of prostate: Secondary | ICD-10-CM | POA: Insufficient documentation

## 2017-03-13 DIAGNOSIS — C773 Secondary and unspecified malignant neoplasm of axilla and upper limb lymph nodes: Secondary | ICD-10-CM | POA: Insufficient documentation

## 2017-03-13 DIAGNOSIS — Z170 Estrogen receptor positive status [ER+]: Secondary | ICD-10-CM | POA: Insufficient documentation

## 2017-03-13 DIAGNOSIS — J45909 Unspecified asthma, uncomplicated: Secondary | ICD-10-CM | POA: Diagnosis not present

## 2017-03-13 DIAGNOSIS — Z825 Family history of asthma and other chronic lower respiratory diseases: Secondary | ICD-10-CM | POA: Diagnosis not present

## 2017-03-13 DIAGNOSIS — C50911 Malignant neoplasm of unspecified site of right female breast: Secondary | ICD-10-CM | POA: Diagnosis present

## 2017-03-13 DIAGNOSIS — K219 Gastro-esophageal reflux disease without esophagitis: Secondary | ICD-10-CM | POA: Insufficient documentation

## 2017-03-13 DIAGNOSIS — Z79899 Other long term (current) drug therapy: Secondary | ICD-10-CM | POA: Diagnosis not present

## 2017-03-13 DIAGNOSIS — Z9221 Personal history of antineoplastic chemotherapy: Secondary | ICD-10-CM | POA: Diagnosis not present

## 2017-03-13 DIAGNOSIS — Z7982 Long term (current) use of aspirin: Secondary | ICD-10-CM | POA: Insufficient documentation

## 2017-03-13 DIAGNOSIS — Z8249 Family history of ischemic heart disease and other diseases of the circulatory system: Secondary | ICD-10-CM | POA: Diagnosis not present

## 2017-03-13 DIAGNOSIS — G4730 Sleep apnea, unspecified: Secondary | ICD-10-CM | POA: Diagnosis not present

## 2017-03-13 DIAGNOSIS — G709 Myoneural disorder, unspecified: Secondary | ICD-10-CM | POA: Insufficient documentation

## 2017-03-13 DIAGNOSIS — Z803 Family history of malignant neoplasm of breast: Secondary | ICD-10-CM | POA: Diagnosis not present

## 2017-03-13 DIAGNOSIS — Z888 Allergy status to other drugs, medicaments and biological substances status: Secondary | ICD-10-CM | POA: Insufficient documentation

## 2017-03-13 DIAGNOSIS — I739 Peripheral vascular disease, unspecified: Secondary | ICD-10-CM | POA: Diagnosis not present

## 2017-03-13 HISTORY — PX: AXILLARY LYMPH NODE DISSECTION: SHX5229

## 2017-03-13 LAB — BASIC METABOLIC PANEL
ANION GAP: 9 (ref 5–15)
BUN: 7 mg/dL (ref 6–20)
CALCIUM: 9.4 mg/dL (ref 8.9–10.3)
CO2: 20 mmol/L — ABNORMAL LOW (ref 22–32)
Chloride: 111 mmol/L (ref 101–111)
Creatinine, Ser: 0.68 mg/dL (ref 0.44–1.00)
GLUCOSE: 91 mg/dL (ref 65–99)
POTASSIUM: 3.9 mmol/L (ref 3.5–5.1)
SODIUM: 140 mmol/L (ref 135–145)

## 2017-03-13 LAB — CBC
HEMATOCRIT: 38.6 % (ref 36.0–46.0)
HEMOGLOBIN: 13 g/dL (ref 12.0–15.0)
MCH: 30 pg (ref 26.0–34.0)
MCHC: 33.7 g/dL (ref 30.0–36.0)
MCV: 89.1 fL (ref 78.0–100.0)
Platelets: 249 10*3/uL (ref 150–400)
RBC: 4.33 MIL/uL (ref 3.87–5.11)
RDW: 14.6 % (ref 11.5–15.5)
WBC: 8.3 10*3/uL (ref 4.0–10.5)

## 2017-03-13 SURGERY — LYMPHADENECTOMY, AXILLARY
Anesthesia: General | Site: Axilla | Laterality: Right

## 2017-03-13 MED ORDER — BUPIVACAINE-EPINEPHRINE 0.25% -1:200000 IJ SOLN
INTRAMUSCULAR | Status: DC | PRN
  Administered 2017-03-13: 20 mL

## 2017-03-13 MED ORDER — FENTANYL CITRATE (PF) 100 MCG/2ML IJ SOLN
INTRAMUSCULAR | Status: AC
  Administered 2017-03-13: 100 ug via INTRAVENOUS
  Filled 2017-03-13: qty 2

## 2017-03-13 MED ORDER — GABAPENTIN 300 MG PO CAPS
300.0000 mg | ORAL_CAPSULE | ORAL | Status: AC
  Administered 2017-03-13: 300 mg via ORAL

## 2017-03-13 MED ORDER — DEXAMETHASONE SODIUM PHOSPHATE 10 MG/ML IJ SOLN
INTRAMUSCULAR | Status: DC | PRN
  Administered 2017-03-13: 10 mg via INTRAVENOUS

## 2017-03-13 MED ORDER — METOCLOPRAMIDE HCL 5 MG/ML IJ SOLN: 10.0000 mg | Freq: Once | INTRAMUSCULAR | Status: DC | PRN

## 2017-03-13 MED ORDER — LIDOCAINE 2% (20 MG/ML) 5 ML SYRINGE
INTRAMUSCULAR | Status: DC | PRN
Start: 2017-03-13 — End: 2017-03-13
  Administered 2017-03-13: 70 mg via INTRAVENOUS

## 2017-03-13 MED ORDER — CHLORHEXIDINE GLUCONATE CLOTH 2 % EX PADS: 6.0000 | MEDICATED_PAD | Freq: Once | CUTANEOUS | Status: DC

## 2017-03-13 MED ORDER — FENTANYL CITRATE (PF) 100 MCG/2ML IJ SOLN
100.0000 ug | Freq: Once | INTRAMUSCULAR | Status: AC
  Administered 2017-03-13: 100 ug via INTRAVENOUS

## 2017-03-13 MED ORDER — MIDAZOLAM HCL 2 MG/2ML IJ SOLN
INTRAMUSCULAR | Status: AC
  Administered 2017-03-13: 2 mg via INTRAVENOUS
  Filled 2017-03-13: qty 2

## 2017-03-13 MED ORDER — PROPOFOL 10 MG/ML IV BOLUS
INTRAVENOUS | Status: DC | PRN
  Administered 2017-03-13: 170 mg via INTRAVENOUS

## 2017-03-13 MED ORDER — HYDROMORPHONE HCL 1 MG/ML IJ SOLN
0.2500 mg | INTRAMUSCULAR | Status: DC | PRN
  Administered 2017-03-13 (×2): 0.5 mg via INTRAVENOUS

## 2017-03-13 MED ORDER — FENTANYL CITRATE (PF) 250 MCG/5ML IJ SOLN
INTRAMUSCULAR | Status: AC
  Filled 2017-03-13: qty 5

## 2017-03-13 MED ORDER — OXYCODONE HCL 5 MG/5ML PO SOLN: 5.0000 mg | Freq: Once | ORAL | Status: DC | PRN

## 2017-03-13 MED ORDER — OXYCODONE HCL 5 MG PO TABS: 5.0000 mg | ORAL_TABLET | Freq: Once | ORAL | Status: DC | PRN

## 2017-03-13 MED ORDER — LIDOCAINE 2% (20 MG/ML) 5 ML SYRINGE
INTRAMUSCULAR | Status: AC
  Filled 2017-03-13: qty 5

## 2017-03-13 MED ORDER — MEPERIDINE HCL 25 MG/ML IJ SOLN: 6.2500 mg | INTRAMUSCULAR | Status: DC | PRN

## 2017-03-13 MED ORDER — LACTATED RINGERS IV SOLN
INTRAVENOUS | Status: DC
  Administered 2017-03-13: 10:00:00 via INTRAVENOUS

## 2017-03-13 MED ORDER — HYDROMORPHONE HCL 1 MG/ML IJ SOLN
INTRAMUSCULAR | Status: AC
  Administered 2017-03-13: 0.5 mg via INTRAVENOUS
  Filled 2017-03-13: qty 1

## 2017-03-13 MED ORDER — MIDAZOLAM HCL 2 MG/2ML IJ SOLN
2.0000 mg | Freq: Once | INTRAMUSCULAR | Status: AC
  Administered 2017-03-13: 2 mg via INTRAVENOUS

## 2017-03-13 MED ORDER — GABAPENTIN 300 MG PO CAPS
ORAL_CAPSULE | ORAL | Status: AC
  Administered 2017-03-13: 300 mg via ORAL
  Filled 2017-03-13: qty 1

## 2017-03-13 MED ORDER — MIDAZOLAM HCL 2 MG/2ML IJ SOLN
INTRAMUSCULAR | Status: AC
  Filled 2017-03-13: qty 2

## 2017-03-13 MED ORDER — PHENYLEPHRINE 40 MCG/ML (10ML) SYRINGE FOR IV PUSH (FOR BLOOD PRESSURE SUPPORT)
PREFILLED_SYRINGE | INTRAVENOUS | Status: DC | PRN
  Administered 2017-03-13: 120 ug via INTRAVENOUS

## 2017-03-13 MED ORDER — BUPIVACAINE-EPINEPHRINE (PF) 0.25% -1:200000 IJ SOLN
INTRAMUSCULAR | Status: AC
  Filled 2017-03-13: qty 30

## 2017-03-13 MED ORDER — CELECOXIB 200 MG PO CAPS
ORAL_CAPSULE | ORAL | Status: AC
  Administered 2017-03-13: 200 mg via ORAL
  Filled 2017-03-13: qty 1

## 2017-03-13 MED ORDER — DEXTROSE 5 % IV SOLN
INTRAVENOUS | Status: DC | PRN
  Administered 2017-03-13: 25 ug/min via INTRAVENOUS

## 2017-03-13 MED ORDER — OXYCODONE HCL 5 MG PO TABS: 5.0000 mg | ORAL_TABLET | Freq: Four times a day (QID) | ORAL | 0 refills | Status: DC | PRN

## 2017-03-13 MED ORDER — PROPOFOL 10 MG/ML IV BOLUS
INTRAVENOUS | Status: AC
  Filled 2017-03-13: qty 20

## 2017-03-13 MED ORDER — FENTANYL CITRATE (PF) 100 MCG/2ML IJ SOLN
INTRAMUSCULAR | Status: DC | PRN
  Administered 2017-03-13 (×2): 25 ug via INTRAVENOUS

## 2017-03-13 MED ORDER — 0.9 % SODIUM CHLORIDE (POUR BTL) OPTIME
TOPICAL | Status: DC | PRN
  Administered 2017-03-13: 1000 mL

## 2017-03-13 MED ORDER — DEXAMETHASONE SODIUM PHOSPHATE 10 MG/ML IJ SOLN
INTRAMUSCULAR | Status: AC
  Filled 2017-03-13: qty 1

## 2017-03-13 MED ORDER — ROPIVACAINE HCL 5 MG/ML IJ SOLN
INTRAMUSCULAR | Status: DC | PRN
  Administered 2017-03-13: 30 mL via PERINEURAL

## 2017-03-13 MED ORDER — ACETAMINOPHEN 500 MG PO TABS
ORAL_TABLET | ORAL | Status: AC
  Administered 2017-03-13: 1000 mg via ORAL
  Filled 2017-03-13: qty 2

## 2017-03-13 MED ORDER — ACETAMINOPHEN 500 MG PO TABS
1000.0000 mg | ORAL_TABLET | ORAL | Status: AC
  Administered 2017-03-13: 1000 mg via ORAL

## 2017-03-13 MED ORDER — CELECOXIB 200 MG PO CAPS
200.0000 mg | ORAL_CAPSULE | ORAL | Status: AC
  Administered 2017-03-13: 200 mg via ORAL

## 2017-03-13 SURGICAL SUPPLY — 40 items
APPLIER CLIP 9.375 MED OPEN (MISCELLANEOUS) ×3
BLADE CLIPPER SURG (BLADE) IMPLANT
BLADE SURG 15 STRL LF DISP TIS (BLADE) ×1 IMPLANT
BLADE SURG 15 STRL SS (BLADE) ×2
CANISTER SUCT 3000ML PPV (MISCELLANEOUS) ×3 IMPLANT
CHLORAPREP W/TINT 26ML (MISCELLANEOUS) ×3 IMPLANT
CLIP APPLIE 9.375 MED OPEN (MISCELLANEOUS) ×1 IMPLANT
COVER SURGICAL LIGHT HANDLE (MISCELLANEOUS) ×3 IMPLANT
DECANTER SPIKE VIAL GLASS SM (MISCELLANEOUS) ×3 IMPLANT
DERMABOND ADVANCED (GAUZE/BANDAGES/DRESSINGS) ×2
DERMABOND ADVANCED .7 DNX12 (GAUZE/BANDAGES/DRESSINGS) ×1 IMPLANT
DRAIN CHANNEL 19F RND (DRAIN) ×3 IMPLANT
DRAPE CHEST BREAST 15X10 FENES (DRAPES) ×3 IMPLANT
DRSG PAD ABDOMINAL 8X10 ST (GAUZE/BANDAGES/DRESSINGS) ×3 IMPLANT
ELECT CAUTERY BLADE 6.4 (BLADE) ×3 IMPLANT
ELECT REM PT RETURN 9FT ADLT (ELECTROSURGICAL) ×3
ELECTRODE REM PT RTRN 9FT ADLT (ELECTROSURGICAL) ×1 IMPLANT
EVACUATOR SILICONE 100CC (DRAIN) ×3 IMPLANT
GAUZE SPONGE 4X4 12PLY STRL (GAUZE/BANDAGES/DRESSINGS) ×3 IMPLANT
GLOVE BIO SURGEON STRL SZ8 (GLOVE) ×3 IMPLANT
GLOVE BIOGEL PI IND STRL 8 (GLOVE) ×1 IMPLANT
GLOVE BIOGEL PI INDICATOR 8 (GLOVE) ×2
GOWN STRL REUS W/ TWL LRG LVL3 (GOWN DISPOSABLE) ×1 IMPLANT
GOWN STRL REUS W/ TWL XL LVL3 (GOWN DISPOSABLE) ×1 IMPLANT
GOWN STRL REUS W/TWL LRG LVL3 (GOWN DISPOSABLE) ×2
GOWN STRL REUS W/TWL XL LVL3 (GOWN DISPOSABLE) ×2
KIT BASIN OR (CUSTOM PROCEDURE TRAY) ×3 IMPLANT
KIT ROOM TURNOVER OR (KITS) ×3 IMPLANT
NEEDLE HYPO 25GX1X1/2 BEV (NEEDLE) ×3 IMPLANT
NS IRRIG 1000ML POUR BTL (IV SOLUTION) ×3 IMPLANT
PACK GENERAL/GYN (CUSTOM PROCEDURE TRAY) ×3 IMPLANT
PAD ARMBOARD 7.5X6 YLW CONV (MISCELLANEOUS) ×3 IMPLANT
SPECIMEN JAR MEDIUM (MISCELLANEOUS) ×3 IMPLANT
SUT ETHILON 2 0 FS 18 (SUTURE) ×3 IMPLANT
SUT MNCRL AB 4-0 PS2 18 (SUTURE) ×3 IMPLANT
SUT VIC AB 3-0 SH 18 (SUTURE) ×3 IMPLANT
SUT VICRYL AB 3 0 TIES (SUTURE) ×3 IMPLANT
SYR CONTROL 10ML LL (SYRINGE) ×3 IMPLANT
TOWEL OR 17X24 6PK STRL BLUE (TOWEL DISPOSABLE) ×3 IMPLANT
TOWEL OR 17X26 10 PK STRL BLUE (TOWEL DISPOSABLE) ×3 IMPLANT

## 2017-03-13 NOTE — Anesthesia Procedure Notes (Signed)
Procedure Name: LMA Insertion Date/Time: 03/13/2017 12:45 PM Performed by: Kyung Rudd, CRNA Pre-anesthesia Checklist: Patient identified, Emergency Drugs available, Suction available and Patient being monitored Patient Re-evaluated:Patient Re-evaluated prior to induction Oxygen Delivery Method: Circle system utilized Preoxygenation: Pre-oxygenation with 100% oxygen Induction Type: IV induction LMA: LMA inserted LMA Size: 4.0 Number of attempts: 1 Placement Confirmation: positive ETCO2 and breath sounds checked- equal and bilateral Tube secured with: Tape Dental Injury: Teeth and Oropharynx as per pre-operative assessment

## 2017-03-13 NOTE — Anesthesia Preprocedure Evaluation (Addendum)
Anesthesia Evaluation  Patient identified by MRN, date of birth, ID band Patient awake    Reviewed: Allergy & Precautions, NPO status , Patient's Chart, lab work & pertinent test results  History of Anesthesia Complications Negative for: history of anesthetic complications  Airway Mallampati: II  TM Distance: >3 FB Neck ROM: Full    Dental no notable dental hx. (+) Upper Dentures, Dental Advisory Given   Pulmonary asthma , sleep apnea , Current Smoker,    breath sounds clear to auscultation       Cardiovascular hypertension, + CAD, + Past MI and + Peripheral Vascular Disease   Rhythm:Regular Rate:Normal     Neuro/Psych  Neuromuscular disease    GI/Hepatic GERD  ,  Endo/Other    Renal/GU Renal disease     Musculoskeletal   Abdominal   Peds  Hematology   Anesthesia Other Findings   Reproductive/Obstetrics                           Anesthesia Physical  Anesthesia Plan  ASA: III  Anesthesia Plan: General   Post-op Pain Management:  Regional for Post-op pain   Induction: Intravenous  PONV Risk Score and Plan: 3 and Dexamethasone, Treatment may vary due to age or medical condition, Ondansetron and Midazolam  Airway Management Planned: LMA  Additional Equipment:   Intra-op Plan:   Post-operative Plan: Extubation in OR  Informed Consent: I have reviewed the patients History and Physical, chart, labs and discussed the procedure including the risks, benefits and alternatives for the proposed anesthesia with the patient or authorized representative who has indicated his/her understanding and acceptance.   Dental advisory given  Plan Discussed with: CRNA  Anesthesia Plan Comments:        Anesthesia Quick Evaluation

## 2017-03-13 NOTE — Anesthesia Procedure Notes (Signed)
Anesthesia Regional Block: Pectoralis block   Pre-Anesthetic Checklist: ,, timeout performed, Correct Patient, Correct Site, Correct Laterality, Correct Procedure, Correct Position, site marked, Risks and benefits discussed,  Surgical consent,  Pre-op evaluation,  At surgeon's request and post-op pain management  Laterality: Right  Prep: chloraprep       Needles:  Injection technique: Single-shot  Needle Type: Stimiplex     Needle Length: 9cm  Needle Gauge: 21     Additional Needles:   Procedures:,,,, ultrasound used (permanent image in chart),,,,  Narrative:  Start time: 03/13/2017 11:40 AM End time: 03/13/2017 11:45 AM Injection made incrementally with aspirations every 5 mL.  Performed by: Personally  Anesthesiologist: Lynda Rainwater, MD

## 2017-03-13 NOTE — Interval H&P Note (Signed)
History and Physical Interval Note:  03/13/2017 12:06 PM  [name]  has presented today for surgery, with the diagnosis of BREAST CANCER  The various methods of treatment have been discussed with the patient and family. After consideration of risks, benefits and other options for treatment, the patient has consented to  Procedure(s): AXILLARY LYMPH NODE DISSECTION (Right) as a surgical intervention .  The patient's history has been reviewed, patient examined, no change in status, stable for surgery.  I have reviewed the patient's chart and labs.  Questions were answered to the patient's satisfaction.     [place]

## 2017-03-13 NOTE — Discharge Instructions (Signed)
Surgical Drain Home Care °Surgical drains are used to remove extra fluid that normally builds up in a surgical wound after surgery. A surgical drain helps to heal a surgical wound. Different kinds of surgical drains include: °· Active drains. These drains use suction to pull drainage away from the surgical wound. Drainage flows through a tube to a container outside of the body. It is important to keep the bulb or the drainage container flat (compressed) at all times, except while you empty it. Flattening the bulb or container creates suction. The two most common types of active drains are bulb drains and Hemovac drains. °· Passive drains. These drains allow fluid to drain naturally, by gravity. Drainage flows through a tube to a bandage (dressing) or a container outside of the body. Passive drains do not need to be emptied. The most common type of passive drain is the Penrose drain. ° °A drain is placed during surgery. Immediately after surgery, drainage is usually bright red and a little thicker than water. The drainage may gradually turn yellow or pink and become thinner. It is likely that your health care provider will remove the drain when the drainage stops or when the amount decreases to 1-2 Tbsp (15-30 mL) during a 24-hour period. °How to care for your surgical drain °· Keep the skin around the drain dry and covered with a dressing at all times. °· Check your drain area every day for signs of infection. Check for: °? More redness, swelling, or pain. °? Pus or a bad smell. °? Cloudy drainage. °Follow instructions from your health care provider about how to take care of your drain and how to change your dressing. Change your dressing at least one time every day. Change it more often if needed to keep the dressing dry. Make sure you: °1. Gather your supplies, including: °? Tape. °? Germ-free cleaning solution (sterile saline). °? Split gauze drain sponge: 4 x 4 inches (10 x 10 cm). °? Gauze square: 4 x 4 inches  (10 x 10 cm). °2. Wash your hands with soap and water before you change your dressing. If soap and water are not available, use hand sanitizer. °3. Remove the old dressing. Avoid using scissors to do that. °4. Use sterile saline to clean your skin around the drain. °5. Place the tube through the slit in a drain sponge. Place the drain sponge so that it covers your wound. °6. Place the gauze square or another drain sponge on top of the drain sponge that is on the wound. Make sure the tube is between those layers. °7. Tape the dressing to your skin. °8. If you have an active bulb or Hemovac drain, tape the drainage tube to your skin 1-2 inches (2.5-5 cm) below the place where the tube enters your body. Taping keeps the tube from pulling on any stitches (sutures) that you have. °9. Wash your hands with soap and water. °10. Write down the color of your drainage and how often you change your dressing. ° °How to empty your active bulb or Hemovac drain °1. Make sure that you have a measuring cup that you can empty your drainage into. °2. Wash your hands with soap and water. If soap and water are not available, use hand sanitizer. °3. Gently move your fingers down the tube while squeezing very lightly. This is called stripping the tube. This clears any drainage, clots, or tissue from the tube. °? Do not pull on the tube. °? You may need to strip   the tube several times every day to keep the tube clear. °4. Open the bulb cap or the drain plug. Do not touch the inside of the cap or the bottom of the plug. °5. Empty all of the drainage into the measuring cup. °6. Compress the bulb or the container and replace the cap or the plug. To compress the bulb or the container, squeeze it firmly in the middle while you close the cap or plug the container. °7. Write down the amount of drainage that you have in each 24-hour period. If you have less than 2 Tbsp (30 mL) of drainage during 24 hours, contact your health care  provider. °8. Flush the drainage down the toilet. °9. Wash your hands with soap and water. °Contact a health care provider if: °· You have more redness, swelling, or pain around your drain area. °· The amount of drainage that you have is increasing instead of decreasing. °· You have pus or a bad smell coming from your drain area. °· You have a fever. °· You have drainage that is cloudy. °· There is a sudden stop or a sudden decrease in the amount of drainage that you have. °· Your tube falls out. °· Your active drain does not stay compressed after you empty it. °This information is not intended to replace advice given to you by your health care provider. Make sure you discuss any questions you have with your health care provider. °Document Released: 03/08/2000 Document Revised: 08/17/2015 Document Reviewed: 09/28/2014 °Elsevier Interactive Patient Education © 2018 [employer] ° °

## 2017-03-13 NOTE — Transfer of Care (Signed)
Immediate Anesthesia Transfer of Care Note  Patient: [name]  Procedure(s) Performed: AXILLARY LYMPH NODE DISSECTION (Right Axilla)  Patient Location: PACU  Anesthesia Type:General  Level of Consciousness: awake, alert  and oriented  Airway & Oxygen Therapy: Patient Spontanous Breathing and Patient connected to nasal cannula oxygen  Post-op Assessment: Report given to RN, Post -op Vital signs reviewed and stable and Patient moving all extremities X 4  Post vital signs: Reviewed and stable  Last Vitals:  Vitals:   03/13/17 1215 03/13/17 1350  BP: (!) 90/56   Pulse: (!) 58 74  Resp: 18 14  Temp:  (!) 36.2 C  SpO2: 98% 94%    Last Pain:  Vitals:   03/13/17 1140  TempSrc:   PainSc: 0-No pain         Complications: No apparent anesthesia complications

## 2017-03-13 NOTE — Telephone Encounter (Signed)
Called and spoke with [place] and Altha Harm from [facility] in order to get PET scan approved.  Spent approximately 5 minutes on the phone scheduling a peer to peer between myself and Dr. Gertie Gowda to take place at 4pm tomorrow, 03/14/2017.  They were given my direct number to call.    Wilber Bihari, NP

## 2017-03-13 NOTE — Op Note (Signed)
Preoperative diagnosis: Stage IIb right breast cancer  Postoperative diagnosis: Same  Procedure: Right axillary lymph node dissection  Surgeon: Erroll Luna MD  Anesthesia: General with local  EBL: 30 cc  Drains: 19 round Blake drain to right axilla  Specimen: Axillary contents to pathology  IV fluids: Per anesthesia record  Indications for procedure: The patient is a 46 year old female who received neoadjuvant chemotherapy chemotherapy followed by right mastectomy with reconstruction and sentinel lymph node mapping.  She had residual disease in her sentinel nodes and returns for lymph node dissection for clearance of the axilla for better local control.  She is aware this will not increase her life span.  Risk, benefits and other options discussed.Axillary  dissection has been discussed with the patient.  Risk of bleeding,  Infection,  Seroma formation,  Additional procedures,  Shoulder weakness ,  Shoulder stiffness,  Nerve and blood vessel injury and reaction to the mapping dyes have been discussed.  Alternatives to surgery have been discussed with the patient.  The patient agrees to proceed.  Description of procedure: The patient is brought to the operating room from the holding area.  The right side was marked as correct.  She was placed upon the OR table.  After induction of general anesthesia, the right axilla was prepped and draped in sterile fashion.  Timeout was done.  Transverse incision made was made in the right axillary crease.  Dissection was carried down into the level 1 contents.  She had significant scarring.  The axillary vein, long thoracic nerve and thoracodorsal trunk were exposed.  The remaining lymphovascular tissue that she had left were removed in its entirety.  She had 5 nodes already removed preoperatively.  I felt no other nodal tissue either in level 1 level 2 or level 3 areas.  Given scarring in the clearance of all lymphovascular tissue I felt no further  dissection was necessary.  There is no evidence of injury to the axillary vein, long thoracic nerve or thoracodorsal trunk.  Through separate stab incision in 19 round drain was placed.  3-0 Vicryl was used to approximate the subcutaneous fat and 4-0 Monocryl was used to close the skin in a subcuticular fashion.  Dermabond applied.  All final counts found to be correct.  Bulb suction placed to bulb.  Patient was awoke extubated taken to recovery in satisfactory condition.

## 2017-03-14 ENCOUNTER — Encounter (HOSPITAL_COMMUNITY): Payer: Self-pay | Admitting: Surgery

## 2017-03-14 ENCOUNTER — Telehealth: Payer: Self-pay | Admitting: Adult Health

## 2017-03-14 NOTE — Telephone Encounter (Signed)
Spoke to Dr. Gertie Gowda at [facility] for approximately 1 minute and 30 seconds.  He would not authorize PET Scan.  He recommended CT chest and bone scan, and then a PET scan if needed, since she was not Stage III.    Wilber Bihari, NP

## 2017-03-14 NOTE — Anesthesia Postprocedure Evaluation (Signed)
Anesthesia Post Note  Patient: [name]  Procedure(s) Performed: AXILLARY LYMPH NODE DISSECTION (Right Axilla)     Patient location during evaluation: PACU Anesthesia Type: General Level of consciousness: awake and alert Pain management: pain level controlled Vital Signs Assessment: post-procedure vital signs reviewed and stable Respiratory status: spontaneous breathing, nonlabored ventilation, respiratory function stable and patient connected to nasal cannula oxygen Cardiovascular status: blood pressure returned to baseline and stable Postop Assessment: no apparent nausea or vomiting Anesthetic complications: no    Last Vitals:  Vitals:   03/13/17 1507 03/13/17 1624  BP: 120/69 136/82  Pulse: 63   Resp: 18 16  Temp:  36.6 C  SpO2: 95% 94%    Last Pain:  Vitals:   03/13/17 1452  TempSrc:   PainSc: 3                  Effie Berkshire

## 2017-03-18 NOTE — Telephone Encounter (Signed)
May Can you place an order for CT CAP? Thanks [employer]

## 2017-03-19 ENCOUNTER — Other Ambulatory Visit: Payer: Self-pay | Admitting: Adult Health

## 2017-03-19 DIAGNOSIS — C50811 Malignant neoplasm of overlapping sites of right female breast: Secondary | ICD-10-CM

## 2017-03-19 DIAGNOSIS — Z170 Estrogen receptor positive status [ER+]: Principal | ICD-10-CM

## 2017-03-19 NOTE — Telephone Encounter (Signed)
Orders placed.  Can you please cancel PET scan on schedule?

## 2017-03-24 DIAGNOSIS — I517 Cardiomegaly: Secondary | ICD-10-CM | POA: Insufficient documentation

## 2017-03-24 DIAGNOSIS — K219 Gastro-esophageal reflux disease without esophagitis: Secondary | ICD-10-CM | POA: Insufficient documentation

## 2017-03-24 DIAGNOSIS — G510 Bell's palsy: Secondary | ICD-10-CM | POA: Insufficient documentation

## 2017-03-24 DIAGNOSIS — I701 Atherosclerosis of renal artery: Secondary | ICD-10-CM | POA: Insufficient documentation

## 2017-03-24 DIAGNOSIS — I10 Essential (primary) hypertension: Secondary | ICD-10-CM | POA: Insufficient documentation

## 2017-03-24 DIAGNOSIS — I2510 Atherosclerotic heart disease of native coronary artery without angina pectoris: Secondary | ICD-10-CM | POA: Insufficient documentation

## 2017-03-24 DIAGNOSIS — Z1379 Encounter for other screening for genetic and chromosomal anomalies: Secondary | ICD-10-CM | POA: Insufficient documentation

## 2017-03-27 ENCOUNTER — Ambulatory Visit (HOSPITAL_COMMUNITY): Admission: RE | Admit: 2017-03-27 | Payer: Medicaid Other | Source: Ambulatory Visit

## 2017-03-28 ENCOUNTER — Telehealth: Payer: Self-pay | Admitting: Adult Health

## 2017-03-28 NOTE — Telephone Encounter (Signed)
Spoke with patient to confirm new CT scan appointment also advised patient to pick up prep kit before appointment

## 2017-03-31 ENCOUNTER — Ambulatory Visit (HOSPITAL_COMMUNITY): Payer: Medicaid Other

## 2017-04-02 ENCOUNTER — Ambulatory Visit (HOSPITAL_COMMUNITY): Payer: Medicaid Other

## 2017-04-02 NOTE — Progress Notes (Signed)
error 

## 2017-04-03 ENCOUNTER — Telehealth: Payer: Self-pay | Admitting: Hematology and Oncology

## 2017-04-03 NOTE — Telephone Encounter (Signed)
Spoke with patient in reference to rescheduling her CT appointment, she advised the contrast makes her sick and vomit, she wanted to know if there was an alternative to this, I gave message to [name] to call patient and let me know if or when to reschedule CT scan

## 2017-04-04 ENCOUNTER — Ambulatory Visit: Payer: Medicaid Other | Admitting: Cardiovascular Disease

## 2017-04-08 ENCOUNTER — Ambulatory Visit
Admission: RE | Admit: 2017-04-08 | Discharge: 2017-04-08 | Disposition: A | Discharge status: Home / Self Care | Payer: Medicaid Other | Source: Ambulatory Visit | Attending: Radiation Oncology | Admitting: Radiation Oncology

## 2017-04-08 ENCOUNTER — Ambulatory Visit: Payer: Medicaid Other | Admitting: Cardiovascular Disease

## 2017-04-08 ENCOUNTER — Encounter: Payer: Self-pay | Admitting: Cardiovascular Disease

## 2017-04-08 ENCOUNTER — Ambulatory Visit: Payer: Medicaid Other

## 2017-04-08 VITALS — BP 133/94 | HR 66 | Ht 63.0 in | Wt 167.6 lb

## 2017-04-08 DIAGNOSIS — J45909 Unspecified asthma, uncomplicated: Secondary | ICD-10-CM | POA: Diagnosis not present

## 2017-04-08 DIAGNOSIS — C50811 Malignant neoplasm of overlapping sites of right female breast: Secondary | ICD-10-CM

## 2017-04-08 DIAGNOSIS — Z8579 Personal history of other malignant neoplasms of lymphoid, hematopoietic and related tissues: Secondary | ICD-10-CM

## 2017-04-08 DIAGNOSIS — E785 Hyperlipidemia, unspecified: Secondary | ICD-10-CM | POA: Diagnosis not present

## 2017-04-08 DIAGNOSIS — I701 Atherosclerosis of renal artery: Secondary | ICD-10-CM

## 2017-04-08 DIAGNOSIS — I214 Non-ST elevation (NSTEMI) myocardial infarction: Secondary | ICD-10-CM

## 2017-04-08 DIAGNOSIS — I10 Essential (primary) hypertension: Secondary | ICD-10-CM | POA: Diagnosis not present

## 2017-04-08 DIAGNOSIS — I517 Cardiomegaly: Secondary | ICD-10-CM

## 2017-04-08 DIAGNOSIS — Z170 Estrogen receptor positive status [ER+]: Secondary | ICD-10-CM

## 2017-04-08 NOTE — Progress Notes (Signed)
04/08/2017 [name]   [birth year]  [id number]  Primary Physician Pavelock, Ralene Bathe, MD Primary Cardiologist: Dr Claiborne Billings  HPI:  47 y.o. female with a history of renovascular HTN, right sided Bell's palsy in 2001 with residual right-sided facial weakness, lymphoma of neck, who came to the ED by EMS 09/17/15 for chest pain. She had previously been followed by Dr. Einar Gip for difficult to control blood pressure on multiple regimens. Renal duplex showed renal artery stenosis and she is s/p successful stent placement to the left renal artery with reduction in stenosis from 80% to 0% May 2016. 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 Einar Gip since. She ruled in for a NSTEMI with Troponin of 1.57. Cath done 09/18/15 showed 65% LAD. It was felt her NSTEMI was secondary to hypertensive urgency. The pt tells me she was taking her medication before that admission. She is in the office today for follow up. She has not had further chest pain severe enough to use NTG.   Current Outpatient Medications  Medication Sig Dispense Refill  . acetaminophen (TYLENOL) 500 MG tablet Take 1,000 mg by mouth every 8 (eight) hours as needed for mild pain or headache.    . albuterol (PROVENTIL) (2.5 MG/3ML) 0.083% nebulizer solution Take 3 mLs (2.5 mg total) by nebulization every 6 (six) hours as needed for wheezing or shortness of breath. 75 mL 12  . amLODipine (NORVASC) 10 MG tablet Take 10 mg by mouth daily.    Marland Kitchen aspirin 81 MG chewable tablet Chew 81 mg by mouth daily.    Marland Kitchen atorvastatin (LIPITOR) 80 MG tablet Take 1 tablet (80 mg total) by mouth daily at 6 PM. 30 tablet 0  . clopidogrel (PLAVIX) 75 MG tablet Take 75 mg by mouth daily.    Marland Kitchen losartan (COZAAR) 50 MG tablet Take 50 mg by mouth daily.  3  . metoprolol succinate (TOPROL XL) 100 MG 24 hr tablet Take 1 tablet (100 mg total) by mouth daily. Take with or  immediately following a meal. 30 tablet 0  . mometasone-formoterol (DULERA) 200-5 MCG/ACT AERO Inhale 2 puffs into the lungs 2 (two) times daily. 1 Inhaler 1  . nitroGLYCERIN (NITROSTAT) 0.4 MG SL tablet Place 0.4 mg under the tongue every 5 (five) minutes as needed for chest pain.    Marland Kitchen PROAIR HFA 108 (90 Base) MCG/ACT inhaler Inhale 2 puffs into the lungs every 6 (six) hours as needed for wheezing or shortness of breath.   3  . venlafaxine XR (EFFEXOR-XR) 37.5 MG 24 hr capsule Take 1 capsule (37.5 mg total) by mouth daily with breakfast. 30 capsule 6  . isosorbide mononitrate (IMDUR) 60 MG 24 hr tablet Take 1 tablet (60 mg total) by mouth daily. 90 tablet 3   No current facility-administered medications for this visit.     Allergies  Allergen Reactions  . Compazine [Prochlorperazine] Anaphylaxis, Swelling and Other (See Comments)    Throat swelling  . Ondansetron Hcl Anaphylaxis, Swelling and Other (See Comments)    Throat swelling per patient    Social History   Socioeconomic History  . Marital status: Single    Spouse name: Not on file  . Number of children: 4  . Years of education: 9  . Highest education level: Not on file  Social Needs  . Financial resource strain: Not on file  . Food insecurity - worry: Not on file  .  Food insecurity - inability: Not on file  . Transportation needs - medical: Not on file  . Transportation needs - non-medical: Not on file  Occupational History  . Occupation: Unemployed  Tobacco Use  . Smoking status: Current Every Day Smoker    Packs/day: 0.50    Years: 15.00    Pack years: 7.50    Types: Cigarettes  . Smokeless tobacco: Never Used  . Tobacco comment: 6 cig per day  Substance and Sexual Activity  . Alcohol use: No    Alcohol/week: 0.0 oz  . Drug use: No  . Sexual activity: Yes    Birth control/protection: None  Other Topics Concern  . Not on file  Social History Narrative   Lives at home with fiance and kids   Caffeine use: 2  cups coffee per day   4 Dr. Malachi Bonds (12oz) per day      Review of Systems: General: negative for chills, fever, night sweats or weight changes.  Cardiovascular: negative for chest pain, dyspnea on exertion, edema, orthopnea, palpitations, paroxysmal nocturnal dyspnea or shortness of breath Dermatological: negative for rash Respiratory: negative for cough or wheezing Urologic: negative for hematuria Abdominal: negative for nausea, vomiting, diarrhea, bright red blood per rectum, melena, or hematemesis Neurologic: negative for visual changes, syncope, or dizziness All other systems reviewed and are otherwise negative except as noted above.    Blood pressure (!) 133/94, pulse 66, height 5\' 3"  (1.6 m), weight 167 lb 9.6 oz (76 kg).  General appearance: alert, cooperative, no distress and Rt facial droop-Bells Palsey Neck: no carotid bruit and no JVD Lungs: clear to auscultation bilaterally Heart: regular rate and rhythm Abdomen: soft, non-tender; bowel sounds normal; no masses,  no organomegaly Extremities: extremities normal, atraumatic, no cyanosis or edema Pulses: 2+ and symmetric Skin: Skin color, texture, turgor normal. No rashes or lesions Neurologic: Grossly normal   ASSESSMENT AND PLAN:   No problem-specific Assessment & Plan notes found for this encounter.   PLAN  Check renal dopplers to document patency. F/U Dr Claiborne Billings in 3 months. Check CMET, Lipids then.   Shelva Majestic PA-C 04/08/2017 4:44 PM

## 2017-04-08 NOTE — Patient Instructions (Signed)
Medication Instructions:  Your physician has recommended you make the following change in your medication:  1) STOP Plavix   Labwork: Your physician recommends that you return for lab work in: FASTING LABS  Testing/Procedures: none  Follow-Up: Your physician recommends that you schedule a follow-up appointment in: 6 months with Dr. Claiborne Billings   Any Other Special Instructions Will Be Listed Below (If Applicable).     If you need a refill on your cardiac medications before your next appointment, please call your pharmacy.

## 2017-04-08 NOTE — Progress Notes (Signed)
Cardiology Office Note    Date:  04/10/2017   ID:  [name], DOB [birth year], MRN [id number]  PCP:  Javier Docker, MD  Cardiologist:  Shelva Majestic, MD   Chief Complaint  Patient presents with  . Follow-up    3 months,    Initial office visit with me  History of Present Illness:  [name] is a 47 y.o. female presents for cardiology follow-up evaluation.  This is the first time I have  seen her in the office but had seen her while she was hospitalized in June 2017.  [name] has a history of renal vascular hypertension and is status post remote left renal artery stenting, right-sided Bell's palsy in 2001, with residual right-sided facial weakness, and a history of lymphoma the neck.  She suffered a non-ST segment elevation myocardial infarction in June 2017.  An cardiac catheterization by Dr. Ellyn Hack revealed a 65% stenosis in a tortuous segment and medical therapy was recommended.  She had seen Kerin Ransom in follow-up of her hospitalization in July 2017.  Over the past year, she was seen by Charlotta Newton, NP for preoperative evaluation prior to breast cancer surgery for which she was needed to be off Plavix.  Subsequently, she was seen by [place].  An ECG showed T-wave inversion V2 through V6 and underwent a nuclear stress test which showed a prior apical infarct and minimal peri-infarct ischemia. EF was estimated at 43% with apical akineis.  Presently, she denies any episodes of chest pain or shortness of breath.  On January 8 she underwent resection of 6 axillary lymph nodes.  She has been off Plavix for several months.  She presents for reevaluation.  Past Medical History:  Diagnosis Date  . Asthma    beginning of 2018 - came to ER    . Bell's palsy   . Breast cancer ([facility]) 01/2017  . Cardiomegaly   . Coronary artery disease    NSTEMI 08/2015 (65% LAD; D1 50%)  . Family history of breast cancer   . Family history of prostate cancer   . 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 GERD (gastroesophageal reflux disease)   . Hypertension   . 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 Renal artery stenosis ([facility])    s/p stent placment to left     Past Surgical History:  Procedure Laterality Date  . AXILLARY LYMPH NODE DISSECTION Right 03/13/2017   Procedure: AXILLARY LYMPH NODE DISSECTION;  Surgeon: Erroll Luna, MD;  Location: [facility];  Service: General;  Laterality: Right;  . BREAST RECONSTRUCTION WITH PLACEMENT OF TISSUE EXPANDER AND FLEX HD (ACELLULAR HYDRATED DERMIS) Right 01/30/2017   Procedure: RIGHT BREAST RECONSTRUCTION WITH PLACEMENT OF TISSUE EXPANDER AND FLEX HD (ACELLULAR HYDRATED DERMIS);  Surgeon: Wallace Going, DO;  Location: [facility];  Service: Plastics;  Laterality: Right;  . BREAST SURGERY     breast bx  07/2016  . CARDIAC CATHETERIZATION N/A 09/18/2015   Procedure: Left Heart Cath and Coronary Angiography;  Surgeon: Leonie Man, MD;  Location: [facility];  Service: Cardiovascular;  Laterality: N/A;  . [place]   x2  . IR CV LINE INJECTION  07/24/2016  . MASTECTOMY WITH RADIOACTIVE SEED GUIDED EXCISION AND AXILLARY SENTINEL LYMPH NODE BIOPSY Right 01/30/2017   Procedure: RIGHT SIMPLE MASTECTOMY WITH  RADIOACTIVE SEED TARGETED RIGHT AXILLARY LYMPH NODE EXCISION AND RIGHT  AXILLARY SENTINEL LYMPH NODE BIOPSY;  Surgeon: Erroll Luna, MD;  Location: [facility];  Service: General;  Laterality: Right;  . PERIPHERAL VASCULAR CATHETERIZATION N/A 08/23/2014   Procedure: Renal Angiography;  Surgeon: Adrian Prows, MD;  Location: [facility];  Service: Cardiovascular;  Laterality: N/A;  . RENAL ARTERY STENT Left 08/23/2014  . SIMPLE MASTECTOMY Right 01/30/2017  . SKIN BIOPSY  2000  . TUBAL LIGATION      Current Medications: Outpatient Medications Prior to Visit  Medication Sig Dispense Refill  . acetaminophen (TYLENOL) 500 MG tablet Take 1,000 mg by mouth every 8 (eight) hours as  needed for mild pain or headache.    . albuterol (PROVENTIL) (2.5 MG/3ML) 0.083% nebulizer solution Take 3 mLs (2.5 mg total) by nebulization every 6 (six) hours as needed for wheezing or shortness of breath. 75 mL 12  . amLODipine (NORVASC) 10 MG tablet Take 10 mg by mouth daily.    Marland Kitchen aspirin 81 MG chewable tablet Chew 81 mg by mouth daily.    Marland Kitchen atorvastatin (LIPITOR) 80 MG tablet Take 1 tablet (80 mg total) by mouth daily at 6 PM. 30 tablet 0  . losartan (COZAAR) 50 MG tablet Take 50 mg by mouth daily.  3  . metoprolol succinate (TOPROL XL) 100 MG 24 hr tablet Take 1 tablet (100 mg total) by mouth daily. Take with or immediately following a meal. 30 tablet 0  . mometasone-formoterol (DULERA) 200-5 MCG/ACT AERO Inhale 2 puffs into the lungs 2 (two) times daily. 1 Inhaler 1  . nitroGLYCERIN (NITROSTAT) 0.4 MG SL tablet Place 0.4 mg under the tongue every 5 (five) minutes as needed for chest pain.    Marland Kitchen PROAIR HFA 108 (90 Base) MCG/ACT inhaler Inhale 2 puffs into the lungs every 6 (six) hours as needed for wheezing or shortness of breath.   3  . venlafaxine XR (EFFEXOR-XR) 37.5 MG 24 hr capsule Take 1 capsule (37.5 mg total) by mouth daily with breakfast. 30 capsule 6  . clopidogrel (PLAVIX) 75 MG tablet Take 75 mg by mouth daily.    . isosorbide mononitrate (IMDUR) 60 MG 24 hr tablet Take 1 tablet (60 mg total) by mouth daily. 90 tablet 3  . betamethasone valerate ointment (VALISONE) 0.1 % Apply 1 application topically 2 (two) times daily. (Patient not taking: Reported on 02/21/2017) 45 g 1  . diazepam (VALIUM) 2 MG tablet Take 1 tablet (2 mg total) every 8 (eight) hours as needed by mouth for muscle spasms. (Patient not taking: Reported on 02/21/2017) 30 tablet 0  . diphenhydrAMINE (BENADRYL) 12.5 MG/5ML elixir Take 5 mLs (12.5 mg total) every 6 (six) hours as needed by mouth for itching. (Patient not taking: Reported on 02/21/2017) 120 mL 0  . HYDROcodone-acetaminophen (NORCO/VICODIN) 5-325 MG  tablet Take 1-2 tablets every 4 (four) hours as needed by mouth for moderate pain. (Patient not taking: Reported on 03/11/2017) 30 tablet 0  . oxyCODONE (OXY IR/ROXICODONE) 5 MG immediate release tablet Take 1-2 tablets (5-10 mg total) by mouth every 6 (six) hours as needed for severe pain. 15 tablet 0  . polyethylene glycol (MIRALAX / GLYCOLAX) packet Take 17 g daily as needed by mouth for mild constipation. (Patient not taking: Reported on 03/06/2017) 14 each 0  . senna (SENOKOT) 8.6 MG TABS tablet Take 1 tablet (8.6 mg total) 2 (two) times daily by mouth. (Patient not taking: Reported on 02/21/2017) 120 each 0  . tamoxifen (NOLVADEX) 20 MG tablet Take 1 tablet (20 mg  total) by mouth daily. (Patient not taking: Reported on 03/11/2017) 90 tablet 3   No facility-administered medications prior to visit.      Allergies:   Compazine [prochlorperazine] and Ondansetron hcl   Social History   Socioeconomic History  . Marital status: Single    Spouse name: None  . Number of children: 4  . Years of education: 9  . Highest education level: None  Social Needs  . Financial resource strain: None  . Food insecurity - worry: None  . Food insecurity - inability: None  . Transportation needs - medical: None  . Transportation needs - non-medical: None  Occupational History  . Occupation: Unemployed  Tobacco Use  . Smoking status: Current Every Day Smoker    Packs/day: 0.50    Years: 15.00    Pack years: 7.50    Types: Cigarettes  . Smokeless tobacco: Never Used  . Tobacco comment: 6 cig per day  Substance and Sexual Activity  . Alcohol use: No    Alcohol/week: 0.0 oz  . Drug use: No  . Sexual activity: Yes    Birth control/protection: None  Other Topics Concern  . None  Social History Narrative   Lives at home with fiance and kids   Caffeine use: 2 cups coffee per day   4 Dr. Malachi Bonds (12oz) per day      Family History:  The patient's family history includes Asthma in her father; Breast  cancer in her maternal aunt; Cervical cancer in her mother; Coronary artery disease (age of onset: 56) in her father; Hypertension in her mother; Prostate cancer in her cousin.   ROS General: Negative; No fevers, chills, or night sweats;  HEENT: Negative; No changes in vision or hearing, sinus congestion, difficulty swallowing Pulmonary: Positive for asthma Cardiovascular: Negative; No chest pain, presyncope, syncope, palpitations GI: Negative; No nausea, vomiting, diarrhea, or abdominal pain GU: Negative; No dysuria, hematuria, or difficulty voiding Musculoskeletal: Negative; no myalgias, joint pain, or weakness Hematologic/Oncology: Positive for breast CA Endocrine: Negative; no heat/cold intolerance; no diabetes Neuro: Negative; no changes in balance, headaches Skin: Negative; No rashes or skin lesions Psychiatric: Negative; No behavioral problems, depression Sleep: Negative; No snoring, daytime sleepiness, hypersomnolence, bruxism, restless legs, hypnogognic hallucinations, no cataplexy Other comprehensive 14 point system review is negative.   PHYSICAL EXAM:   VS:  BP (!) 133/94   Pulse 66   Ht '5\' 3"'  (1.6 m)   Wt 167 lb 9.6 oz (76 kg)   BMI 29.69 kg/m     Repeat blood pressure by me was 122/78  Wt Readings from Last 3 Encounters:  04/10/17 166 lb 6.4 oz (75.5 kg)  04/08/17 167 lb 9.6 oz (76 kg)  03/13/17 170 lb (77.1 kg)    General: Alert, oriented, no distress.  Skin: normal turgor, no rashes, warm and dry HEENT: Normocephalic, atraumatic. Pupils equal round and reactive to light; sclera anicteric; extraocular muscles intact; Fundi ** Nose without nasal septal hypertrophy Mouth/Parynx benign; Mallinpatti scale Neck: No JVD, no carotid bruits; normal carotid upstroke Lungs: clear to ausculatation and percussion; no wheezing or rales Chest wall: without tenderness to palpitation Heart: PMI not displaced, RRR, s1 s2 normal, 1/6 systolic murmur, no diastolic murmur, no  rubs, gallops, thrills, or heaves Abdomen: soft, nontender; no hepatosplenomehaly, BS+; abdominal aorta nontender and not dilated by palpation. Back: no CVA tenderness Pulses 2+ Musculoskeletal: full range of motion, normal strength, no joint deformities Extremities: no clubbing cyanosis or edema, Homan's sign negative  Neurologic: grossly  nonfocal; Cranial nerves grossly wnl Psychologic: Normal mood and affect    Studies/Labs Reviewed:   EKG:  EKG is ordered today.  ECG (independently read by me): Normal sinus rhythm at 66 bpm.  Poor anterior R-wave progression V1 to V3 with nonspecific T changes.  Normal intervals.  Recent Labs: BMP Latest Ref Rng & Units 03/13/2017 01/23/2017 12/19/2016  Glucose 65 - 99 mg/dL 91 97 88  BUN 6 - 20 mg/dL 7 5(L) 7  Creatinine 0.44 - 1.00 mg/dL 0.68 0.71 0.73  BUN/Creat Ratio 9 - 23 - - -  Sodium 135 - 145 mmol/L 140 140 138  Potassium 3.5 - 5.1 mmol/L 3.9 3.4(L) 4.0  Chloride 101 - 111 mmol/L 111 107 107  CO2 22 - 32 mmol/L 20(L) 23 21(L)  Calcium 8.9 - 10.3 mg/dL 9.4 9.4 9.7     Hepatic Function Latest Ref Rng & Units 12/19/2016 10/11/2016 09/20/2016  Total Protein 6.5 - 8.1 g/dL 7.2 6.3(L) 6.8  Albumin 3.5 - 5.0 g/dL 4.2 3.7 3.8  AST 15 - 41 U/L '18 10 11  ' ALT 14 - 54 U/L 13(L) 10 9  Alk Phosphatase 38 - 126 U/L 99 85 96  Total Bilirubin 0.3 - 1.2 mg/dL 0.3 0.44 0.34  Bilirubin, Direct 0.1 - 0.5 mg/dL - - -    CBC Latest Ref Rng & Units 03/13/2017 01/23/2017 12/19/2016  WBC 4.0 - 10.5 K/uL 8.3 11.7(H) 9.3  Hemoglobin 12.0 - 15.0 g/dL 13.0 14.3 14.4  Hematocrit 36.0 - 46.0 % 38.6 42.1 43.7  Platelets 150 - 400 K/uL 249 247 256   Lab Results  Component Value Date   MCV 89.1 03/13/2017   MCV 90.0 01/23/2017   MCV 94.6 12/19/2016   Lab Results  Component Value Date   TSH 1.192 09/18/2015   No results found for: HGBA1C   BNP    Component Value Date/Time   BNP 107.2 (H) 09/18/2015 0340    ProBNP No results found for:  PROBNP   Lipid Panel     Component Value Date/Time   CHOL 183 09/18/2015 0340   TRIG 123 09/18/2015 0340   HDL 47 09/18/2015 0340   CHOLHDL 3.9 09/18/2015 0340   VLDL 25 09/18/2015 0340   LDLCALC 111 (H) 09/18/2015 0340     RADIOLOGY: No results found.   Additional studies/ records that were reviewed today include:  I reviewed the patient's records since her hospitalization with a non-STEMI in June 2017.  I have reviewed the office notes from [facility].   ASSESSMENT:    1. Essential (primary) hypertension   2. Cardiomegaly   3. NSTEMI (non-ST elevated myocardial infarction) ([place]); June 2017   4. Left renal artery stenosis [facility]); s/p stent May 2016   5. Hyperlipidemia LDL goal <70   6. Malignant neoplasm of overlapping sites of right breast in female, estrogen receptor positive ([place])   7. History of lymphoma   8. Uncomplicated asthma, unspecified asthma severity, unspecified whether persistent      PLAN:  [name] is a 47 year old female who has a history of right-sided Bell's palsy with residual right-sided facial weakness, lymphoma, left renal artery stenosis with stenting in May 2016, asthma, GERD, and tobacco abuse.  She suffered a non-ST segment elevation MI in June 2017.  I reviewed the catheterization findings, which showed a 65% stenosis in a very tortuous segment for which medical therapy was recommended.  Troponin was mildly elevated at 1.57.  Presently, she has been without anginal symptomatology.  She did not have any stent implantation.  She has been off surgery in the fall and most recently has undergone right axillary lymph node resection.  She has not had any recurrent anginal symptomatology.  I have recommended discontinuance of Plavix but she will continue with aspirin 81 mg.  She is on atorvastatin 80 mg daily for hyperlipidemia with target LDL less than 70.  Her blood pressure today is controlled on losartan 50 mg, Toprol-XL 100 mg, amlodipine 10 mg in  addition to isosorbide 60 mg.  There is no wheezing on her asthma medication.  She has not had recent lipid assessment.  In June 2017 LDL cholesterol was elevated at 111.  I have recommended fasting laboratory.  I will contact her regarding the results.  As long as she remains stable I'll see her in 6 months for reevaluation.   Medication Adjustments/Labs and Tests Ordered: Current medicines are reviewed at length with the patient today.  Concerns regarding medicines are outlined above.  Medication changes, Labs and Tests ordered today are listed in the Patient Instructions below. Patient Instructions  Medication Instructions:  Your physician has recommended you make the following change in your medication:  1) STOP Plavix   Labwork: Your physician recommends that you return for lab work in: FASTING LABS  Testing/Procedures: none  Follow-Up: Your physician recommends that you schedule a follow-up appointment in: 6 months with Dr. Claiborne Billings   Any Other Special Instructions Will Be Listed Below (If Applicable).     If you need a refill on your cardiac medications before your next appointment, please call your pharmacy.      Signed, Shelva Majestic, MD  04/10/2017 10:49 PM    [facility] [address], [place], [place], [place]  [address] Phone: [phone]

## 2017-04-08 NOTE — Progress Notes (Signed)
Location of Breast Cancer: Right breast  Histology per Pathology Report:  Diagnosis 03/13/17 Lymph nodes, regional resection, Right Axillary Contents - FOUR BENIGN LYMPH NODES (0/4).  Diagnosis 01-30-17 1. 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 2. 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 - DUCTAL CARCINOMA IN SITU, HIGH GRADE. - LYMPHOVASCULAR INVASION IS IDENTIFIED, INCLUDING DERMAL LYMPHATICS. - PERINEURAL INVASION IS IDENTIFIED. - THE SURGICAL RESECTION MARGINS ARE NEGATIVE FOR CARCINOMA. - SEE ONCOLOGY TABLE BELOW. 3. Lymph nodes, regional resection, right axillary - METASTATIC CARCINOMA IN 2 OF 2 LYMPH NODES (2/2), WITH EXTRACAPSULAR EXTENSION.  Receptor Status: ER(100% +), PR (100% +), Her2-neu (), Ki-()  Right axillary Receptor Status: ER(100% +), PR (95% +), Her2-neu (-), Ki-(20%-30%)  Right mastectomy  Diagnosis 07-18-16 1. Breast, right, needle core biopsy, UIQ retroareolar - INVASIVE DUCTAL CARCINOMA, GRADE III. - LYMPH VASCULAR INVOLVEMENT BY TUMOR. 2. Breast, right, needle core biopsy, near 9:00 o'clock UOQ - INVASIVE DUCTAL CARCINOMA, GRADE III. - SEE MICROSCOPIC DESCRIPTION. 3. Lymph node, needle/core biopsy, right axillary - METASTATIC CARCINOMA.  1. FLUORESCENCE IN-SITU HYBRIDIZATION Results: HER2 - NEGATIVE Receptor Status: ER(100% +), PR (95% +), Her2-neu (-), Ki-(20%)   1. PROGNOSTIC INDICATORS   Receptor Status: ER(100% +), PR (95% +), Her2-neu (-), Ki-(30%)    2. PROGNOSTIC INDICATORS  Receptor Status: ER(100% +), PR (95% +), Her2-neu (-), Ki-(30%)    3. PROGNOSTIC INDICATORS  Did patient present with symptoms (if so, please note symptoms) or was this found on screening mammography?: Palpable right breast masses for 1 year had diagnostic screening.  Past/Anticipated interventions by surgeon,  if any: 03/13/17 Procedure: Right axillary lymph node dissection Surgeon: Erroll Luna MD  04/10/17 she still has her JP tube in place. She will see Dr. Brantley Stage on Monday 04/14/17.  01-30-17 Lymph nodes, regional resection, right axillary, 07-18-16 Breast, right, needle core biopsy, UIQ retroareolar, Lymph node, needle/core biopsy, right axillary Breast, simple mastectomy, right   Past/Anticipated interventions by medical oncology, if any:     01-30-17   Adjuvant radiation therapy,antiestrogen therapy with tamoxifen(consideration for Natalee clinical trial)                                                                                                    01-06-17 cardiology appointment due to cardiac issues  needs cardiac clearance for surgery  Dr. Lindi Adie 01-01-17  Start tamoxifen 20 mg daily having worse right breast pain                                                                                                     08-13-16 Genetic testing                                                                                                                    Chemotherapy yes  08-09-16 til 10-11-16 Taxotere and Cytoxan 3 weeks x 4 cycles. Dr. Lindi Adie 02/21/17 1. Neoadjuvant chemotherapy with Taxotereand Cytoxan every 3 weeks4completed July 2018 2. 01/30/2017:Right mastectomy: IDC grade 3, 7.2 cm, high-grade DCIS, lymphovascular invasion present including dermal lymphatics, perineural invasion present, margins negative, 4/5 lymph nodes positive with extracapsular extension, ER 100%, PR 100%, HER-2 negative ratio 1.41 3. Followed by adjuvant radiation therapy 5. Followed by antiestrogen therapy with tamoxifen (consideration for Natalee clinical trial) ---------------------------------------------------------------------  Return to clinic after radiation is complete I explained  to her that patient has very high risk of recurrence based on very poor response to systemic chemotherapy  Lymphedema issues, if any:  She denies.   Pain issues, if any: No  SAFETY ISSUES:  Prior radiation? : No  Pacemaker/ICD? : No  Possible current pregnancy?: No  Is the patient on methotrexate? : No  Menarche    G 4 P 4  BC 29 years  Menopause 10 years   HRT No  Current Complaints / other details:  Treated for lymphoma in 2000 with [facility] chemotherapy, family history of breast cancer maternal aunt and mother cervical cancer.  BP 129/86   Pulse 76   Temp 98.2 F (36.8 C)   Ht '5\' 3"'  (1.6 m)   Wt 166 lb 6.4 oz (75.5 kg)   SpO2 100% Comment: room air  BMI 29.48 kg/m    Wt Readings from Last 3 Encounters:  04/10/17 166 lb 6.4 oz (75.5 kg)  04/08/17 167 lb 9.6 oz (76 kg)  03/13/17 170 lb (77.1 kg)

## 2017-04-10 ENCOUNTER — Ambulatory Visit
Admission: RE | Admit: 2017-04-10 | Discharge: 2017-04-10 | Disposition: A | Discharge status: Home / Self Care | Payer: Medicaid Other | Source: Ambulatory Visit | Attending: Radiation Oncology | Admitting: Radiation Oncology

## 2017-04-10 ENCOUNTER — Encounter: Payer: Self-pay | Admitting: Cardiovascular Disease

## 2017-04-10 ENCOUNTER — Encounter: Payer: Self-pay | Admitting: Radiation Oncology

## 2017-04-10 ENCOUNTER — Telehealth: Payer: Self-pay

## 2017-04-10 ENCOUNTER — Ambulatory Visit (HOSPITAL_COMMUNITY)
Admission: RE | Admit: 2017-04-10 | Discharge: 2017-04-10 | Disposition: A | Discharge status: Home / Self Care | Payer: Medicaid Other | Source: Ambulatory Visit | Attending: Adult Health | Admitting: Adult Health

## 2017-04-10 ENCOUNTER — Ambulatory Visit: Admission: RE | Admit: 2017-04-10 | Payer: Medicaid Other | Source: Ambulatory Visit | Admitting: Radiation Oncology

## 2017-04-10 VITALS — BP 129/86 | HR 76 | Temp 98.2°F | Ht 63.0 in | Wt 166.4 lb

## 2017-04-10 DIAGNOSIS — C50811 Malignant neoplasm of overlapping sites of right female breast: Secondary | ICD-10-CM

## 2017-04-10 DIAGNOSIS — Z170 Estrogen receptor positive status [ER+]: Secondary | ICD-10-CM | POA: Insufficient documentation

## 2017-04-10 DIAGNOSIS — R918 Other nonspecific abnormal finding of lung field: Secondary | ICD-10-CM | POA: Insufficient documentation

## 2017-04-10 DIAGNOSIS — J439 Emphysema, unspecified: Secondary | ICD-10-CM | POA: Diagnosis not present

## 2017-04-10 DIAGNOSIS — I700 Atherosclerosis of aorta: Secondary | ICD-10-CM | POA: Insufficient documentation

## 2017-04-10 DIAGNOSIS — I2510 Atherosclerotic heart disease of native coronary artery without angina pectoris: Secondary | ICD-10-CM | POA: Insufficient documentation

## 2017-04-10 DIAGNOSIS — R590 Localized enlarged lymph nodes: Secondary | ICD-10-CM | POA: Diagnosis not present

## 2017-04-10 DIAGNOSIS — Z510 Encounter for antineoplastic radiation therapy: Secondary | ICD-10-CM | POA: Diagnosis not present

## 2017-04-10 MED ORDER — IOPAMIDOL (ISOVUE-300) INJECTION 61%
100.0000 mL | Freq: Once | INTRAVENOUS | Status: AC | PRN
  Administered 2017-04-10: 100 mL via INTRAVENOUS

## 2017-04-10 MED ORDER — IOPAMIDOL (ISOVUE-300) INJECTION 61%
INTRAVENOUS | Status: AC
  Filled 2017-04-10: qty 100

## 2017-04-10 NOTE — Telephone Encounter (Signed)
error 

## 2017-04-10 NOTE — Progress Notes (Signed)
Radiation Oncology         [phone] ________________________________  Name: [name]        MRN: [id number]  Date of Service: 04/10/2017 DOB: 03/06/1971  CC:Pavelock, Richard M, MD  Gudena, Vinay, MD     REFERRING PHYSICIAN: Gudena, Vinay, MD   DIAGNOSIS: The encounter diagnosis was Malignant neoplasm of overlapping sites of right breast in female, estrogen receptor positive (HCC).   HISTORY OF PRESENT ILLNESS: [name] is a 46 y.o. female seen at the request of Dr. Gudena. She was originallyseen in the multidisciplinary breast clinic in May 2018 for a new diagnosis of right breast cancer. 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 Timothy Brotherton. She's been NED from lymphoma since her treatments. The patient was found to have a palpable mass that she noticed for about a year, she sought care and was supposed to be scheduled for mammography in the fall. She did not have this performed however until April 2018 when two masses were identified. Diagnostic imaging revealed a mass at 1:00 measuring 4.2 x 3.1 x 2.1 cm, and satellite nodules about 4.1 cm away from the mass at 9:00. Two lymph nodes were suspicious in the axilla, and a biopsy of the primary mass, satellite nodule, and lymph node were performed on 07/18/2016. All biopsies revealed grade 3 invasive ductal carcinoma, ER positive, HER-2 negative, Ki-67 of 20-30%.   She was counseled on the role for neoadjuvant chemotherapy and genetic counseling. he was found to have a variant of undetermined significance within the BRCA1 gene when she was tested on 08/22/2016. She completed her chemotherapy between May and July 2018 with Taxotere and Cytoxan x4 cycles. She subsequently transitioned to antiestrogen with tamoxifen daily and underwent surgical resection on 01/30/2017 with a right mastectomy and targeted  lymph node assessment. Her final tumor measurement was 7.2 cm, and the tumor was a grade 3 invasive ductal carcinoma with high-grade DCIS. LV SI and dermal lymphatics were involved as well as perineural invasion. Her margins were negative, but 4 out of 10 lymph nodes were positive with extracapsular extension.Her tumor remained ER and PR positive and HER-2 negative.She was seen in our clinic for follow-up on 03/06/2017, and the recommendation at that time was to proceed with surgery for axillary dissection given her positive nodal status at the time of her surgery in November, followed by a course of 6.5 weeks of radiotherapy to the chest wall and regional nodes.    INTERVAL HISTORY: Since we saw her last, she underwent right axillary dissection on 03/13/2017 with Dr. Cornett. Pathology revealed 4/4 nodes to be benign. She has recovered well from this procedure and returns today for further discussion of the role of adjuvant radiotherapy in the management of her disease.   She still has her JP tube in place and will see Dr. Cornett on Monday 04/14/2017 to have it removed. She denies any pain or lymphedema issues related to her surgery.  PREVIOUS RADIATION THERAPY: No   PAST MEDICAL HISTORY:  Past Medical History:  Diagnosis Date  . Asthma    beginning of 2018 - came to ER    . Bell's palsy   . Breast cancer (HCC) 01/2017  . Cardiomegaly   . Coronary artery disease    NSTEMI 08/2015 (65% LAD; D1 50%)  . Family history of breast cancer   .   Family history of prostate cancer   . 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 GERD (gastroesophageal reflux disease)   . Hypertension   . 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 Renal artery stenosis (HCC)    s/p stent placment to left        PAST SURGICAL HISTORY: Past Surgical History:  Procedure Laterality Date  . AXILLARY LYMPH NODE DISSECTION Right 03/13/2017   Procedure: AXILLARY LYMPH NODE DISSECTION;  Surgeon: Cornett,  Thomas, MD;  Location: [facility] OR;  Service: General;  Laterality: Right;  . BREAST RECONSTRUCTION WITH PLACEMENT OF TISSUE EXPANDER AND FLEX HD (ACELLULAR HYDRATED DERMIS) Right 01/30/2017   Procedure: RIGHT BREAST RECONSTRUCTION WITH PLACEMENT OF TISSUE EXPANDER AND FLEX HD (ACELLULAR HYDRATED DERMIS);  Surgeon: Dillingham, Claire S, DO;  Location: [facility] OR;  Service: Plastics;  Laterality: Right;  . BREAST SURGERY     breast bx  07/2016  . CARDIAC CATHETERIZATION N/A 09/18/2015   Procedure: Left Heart Cath and Coronary Angiography;  Surgeon: David W Harding, MD;  Location: [facility]AS[facility];  Service: Cardiovascular;  Laterality: N/A;  . CESAREAN SECTION  1989 & 1991   x2  . IR CV LINE INJECTION  07/24/2016  . MASTECTOMY WITH RADIOACTIVE SEED GUIDED EXCISION AND AXILLARY SENTINEL LYMPH NODE BIOPSY Right 01/30/2017   Procedure: RIGHT SIMPLE MASTECTOMY WITH  RADIOACTIVE SEED TARGETED RIGHT AXILLARY LYMPH NODE EXCISION AND RIGHT AXILLARY SENTINEL LYMPH NODE BIOPSY;  Surgeon: Cornett, Thomas, MD;  Location: [facility] OR;  Service: General;  Laterality: Right;  . PERIPHERAL VASCULAR CATHETERIZATION N/A 08/23/2014   Procedure: Renal Angiography;  Surgeon: Jay Ganji, MD;  Location: [facility];  Service: Cardiovascular;  Laterality: N/A;  . RENAL ARTERY STENT Left 08/23/2014  . SIMPLE MASTECTOMY Right 01/30/2017  . SKIN BIOPSY  2000  . TUBAL LIGATION       FAMILY HISTORY:  Family History  Problem Relation Age of Onset  . Hypertension Mother   . Cervical cancer Mother        hysterectomy with ?metastatic disease  . Coronary artery disease Father 48       CABG  . Asthma Father   . Breast cancer Maternal Aunt        age at diagnosis unknown  . Prostate cancer Cousin        paternal first cousin  . Migraines Neg Hx      SOCIAL HISTORY:  reports that she has been smoking cigarettes.  She has a 7.50 pack-year smoking history. she has never used smokeless tobacco. She reports that she does not drink  alcohol or use drugs.   ALLERGIES: Compazine [prochlorperazine] and Ondansetron hcl   MEDICATIONS:  Current Outpatient Medications  Medication Sig Dispense Refill  . acetaminophen (TYLENOL) 500 MG tablet Take 1,000 mg by mouth every 8 (eight) hours as needed for mild pain or headache.    . albuterol (PROVENTIL) (2.5 MG/3ML) 0.083% nebulizer solution Take 3 mLs (2.5 mg total) by nebulization every 6 (six) hours as needed for wheezing or shortness of breath. 75 mL 12  . amLODipine (NORVASC) 10 MG tablet Take 10 mg by mouth daily.    . aspirin 81 MG chewable tablet Chew 81 mg by mouth daily.    . atorvastatin (LIPITOR) 80 MG tablet Take 1 tablet (80 mg total) by mouth daily at 6 PM. 30 tablet 0  . losartan (COZAAR) 50 MG tablet Take 50 mg by mouth   daily.  3  . metoprolol succinate (TOPROL XL) 100 MG 24 hr tablet Take 1 tablet (100 mg total) by mouth daily. Take with or immediately following a meal. 30 tablet 0  . mometasone-formoterol (DULERA) 200-5 MCG/ACT AERO Inhale 2 puffs into the lungs 2 (two) times daily. 1 Inhaler 1  . nitroGLYCERIN (NITROSTAT) 0.4 MG SL tablet Place 0.4 mg under the tongue every 5 (five) minutes as needed for chest pain.    . PROAIR HFA 108 (90 Base) MCG/ACT inhaler Inhale 2 puffs into the lungs every 6 (six) hours as needed for wheezing or shortness of breath.   3  . venlafaxine XR (EFFEXOR-XR) 37.5 MG 24 hr capsule Take 1 capsule (37.5 mg total) by mouth daily with breakfast. 30 capsule 6  . isosorbide mononitrate (IMDUR) 60 MG 24 hr tablet Take 1 tablet (60 mg total) by mouth daily. 90 tablet 3   No current facility-administered medications for this encounter.    REVIEW OF SYSTEMS:  A 15 point review of systems is documented in the electronic medical record. This was obtained by the nursing staff. However, I reviewed this with the patient to discuss relevant findings and make appropriate changes.  Pertinent items noted in HPI and remainder of comprehensive ROS  otherwise negative.  PHYSICAL EXAM:  Wt Readings from Last 3 Encounters:  04/10/17 166 lb 6.4 oz (75.5 kg)  04/08/17 167 lb 9.6 oz (76 kg)  03/13/17 170 lb (77.1 kg)   Temp Readings from Last 3 Encounters:  04/10/17 98.2 F (36.8 C)  03/13/17 97.8 F (36.6 C)  03/06/17 98.6 F (37 C) (Oral)   BP Readings from Last 3 Encounters:  04/10/17 129/86  04/08/17 (!) 133/94  03/13/17 136/82   Pulse Readings from Last 3 Encounters:  04/10/17 76  04/08/17 66  03/13/17 63   Pain Assessment Pain Score: 0-No pain/10  General: Well-developed, in no acute distress Breasts: Axillary incision is healing well but with the drain still in place. Chest wall incision still has an open area centrally along the scar, but this appears to be healing adequately as well.   ECOG = 1  0 - Asymptomatic (Fully active, able to carry on all predisease activities without restriction)  1 - Symptomatic but completely ambulatory (Restricted in physically strenuous activity but ambulatory and able to carry out work of a light or sedentary nature. For example, light housework, office work)  2 - Symptomatic, <50% in bed during the day (Ambulatory and capable of all self care but unable to carry out any work activities. Up and about more than 50% of waking hours)  3 - Symptomatic, >50% in bed, but not bedbound (Capable of only limited self-care, confined to bed or chair 50% or more of waking hours)  4 - Bedbound (Completely disabled. Cannot carry on any self-care. Totally confined to bed or chair)  5 - Death   Oken MM, Creech RH, Tormey DC, et al. (1982). "Toxicity and response criteria of the Eastern Cooperative Oncology Group". Am. J. Clin. Oncol. 5 (6): 649-55    LABORATORY DATA:  Lab Results  Component Value Date   WBC 8.3 03/13/2017   HGB 13.0 03/13/2017   HCT 38.6 03/13/2017   MCV 89.1 03/13/2017   PLT 249 03/13/2017   Lab Results  Component Value Date   NA 140 03/13/2017   K 3.9 03/13/2017    CL 111 03/13/2017   CO2 20 (L) 03/13/2017   Lab Results  Component Value Date     ALT 13 (L) 12/19/2016   AST 18 12/19/2016   ALKPHOS 99 12/19/2016   BILITOT 0.3 12/19/2016      RADIOGRAPHY: No results found.     IMPRESSION/PLAN: 1.   Today, I discussed the pathology findings from the patient's lymph node dissection and reviewed the nature of her disease. We discussed that to allow for more healing time, we will postpone her CT simulation appointment for a couple more weeks with radiation to begin shortly after. We discussed that this will not affect her overall prognosis. I recommend a course of 6 1/2 weeks of radiotherapy to the chest wall and regional nodes. Written consent has been obtained and placed in the chart, a copy was provided to the patient.  I spent 15 minutes face to face with the patient and more than 50% of that time was spent in counseling and/or coordination of care.  ------------------------------------------------  John S. Moody, MD, PhD  This document serves as a record of services personally performed by John Moody, MD. It was created on his behalf by Haley Woodruff, a trained medical scribe. The creation of this record is based on the scribe's personal observations and the provider's statements to them. This document has been checked and approved by the attending provider.  

## 2017-04-24 ENCOUNTER — Ambulatory Visit
Admission: RE | Admit: 2017-04-24 | Discharge: 2017-04-24 | Disposition: A | Discharge status: Home / Self Care | Payer: Medicaid Other | Source: Ambulatory Visit | Attending: Radiation Oncology | Admitting: Radiation Oncology

## 2017-04-24 DIAGNOSIS — Z510 Encounter for antineoplastic radiation therapy: Secondary | ICD-10-CM | POA: Diagnosis not present

## 2017-04-24 DIAGNOSIS — Z170 Estrogen receptor positive status [ER+]: Principal | ICD-10-CM

## 2017-04-24 DIAGNOSIS — C50811 Malignant neoplasm of overlapping sites of right female breast: Secondary | ICD-10-CM

## 2017-04-25 ENCOUNTER — Telehealth: Payer: Self-pay | Admitting: Hematology and Oncology

## 2017-04-25 NOTE — Progress Notes (Signed)
  Radiation Oncology         (336) [phone] ________________________________  Name: Kimberly MARTINEZGARCIA MRN: [id number]  Date: 04/24/2017  DOB: 1971-01-12  DIAGNOSIS:     ICD-10-CM   1. Malignant neoplasm of overlapping sites of right breast in female, estrogen receptor positive ([place]) C50.811    Z17.0      SIMULATION AND TREATMENT PLANNING NOTE  The patient presented for simulation prior to beginning her course of radiation treatment for her diagnosis of right-sided breast cancer. The patient was placed in a supine position on a breast board. A customized vac-lock bag was also constructed and this complex treatment device will be used on a daily basis during her treatment. In this fashion, a CT scan was obtained through the chest area and an isocenter was placed near the chest wall at the upper aspect of the right chest.  The patient will be planned to receive a course of radiation initially to a dose of 50.4 gray. This will consist of a 4 field technique targeting the right chest wall as well as the supraclavicular region. Therefore 2 customized medial and lateral tangent fields have been created targeting the chest wall, and also 2 additional customized fields have been designed to treat the supraclavicular region both with a right supraclavicular field and a right posterior axillary boost field. A forward planning/reduced field technique will also be evaluated to determine if this significantly improves the dose homogeneity of the overall plan. Therefore, additional customized blocks/fields may be necessary.  This initial treatment will be accomplished at 1.8 gray per fraction.   The initial plan will consist of a 3-D conformal technique. The target volume/scar, heart and lungs have been contoured and dose volume histograms of each of these structures will be evaluated as part of the 3-D conformal treatment planning process.   It is anticipated that the patient will then receive a 10 gray boost to  the surgical scar. This will be accomplished at 2 gray per fraction. The final anticipated total dose therefore will correspond to 60.4 gray.    _______________________________   Jodelle Gross, MD, PhD

## 2017-04-25 NOTE — Telephone Encounter (Signed)
Mailed patient calendar of upcoming April appointments per 2/1 sch message.

## 2017-05-01 ENCOUNTER — Ambulatory Visit: Payer: Medicaid Other | Admitting: Radiation Oncology

## 2017-05-05 ENCOUNTER — Ambulatory Visit: Payer: Medicaid Other

## 2017-05-06 ENCOUNTER — Ambulatory Visit: Payer: Medicaid Other

## 2017-05-07 ENCOUNTER — Ambulatory Visit: Payer: Medicaid Other

## 2017-05-08 ENCOUNTER — Ambulatory Visit: Payer: Medicaid Other

## 2017-05-09 ENCOUNTER — Ambulatory Visit: Payer: Medicaid Other

## 2017-05-09 DIAGNOSIS — Z510 Encounter for antineoplastic radiation therapy: Secondary | ICD-10-CM | POA: Diagnosis not present

## 2017-05-12 ENCOUNTER — Ambulatory Visit: Payer: Medicaid Other

## 2017-05-13 ENCOUNTER — Ambulatory Visit: Payer: Medicaid Other

## 2017-05-14 ENCOUNTER — Ambulatory Visit
Admission: RE | Admit: 2017-05-14 | Discharge: 2017-05-14 | Disposition: A | Discharge status: Home / Self Care | Payer: Medicaid Other | Source: Ambulatory Visit | Attending: Radiation Oncology | Admitting: Radiation Oncology

## 2017-05-14 DIAGNOSIS — Z510 Encounter for antineoplastic radiation therapy: Secondary | ICD-10-CM | POA: Diagnosis not present

## 2017-05-15 ENCOUNTER — Ambulatory Visit: Payer: Medicaid Other

## 2017-05-16 ENCOUNTER — Ambulatory Visit: Payer: Medicaid Other

## 2017-05-19 ENCOUNTER — Ambulatory Visit: Payer: Medicaid Other

## 2017-05-20 ENCOUNTER — Ambulatory Visit: Payer: Medicaid Other

## 2017-05-21 ENCOUNTER — Ambulatory Visit: Payer: Medicaid Other | Admitting: Radiation Oncology

## 2017-05-22 ENCOUNTER — Telehealth: Payer: Self-pay | Admitting: *Deleted

## 2017-05-22 ENCOUNTER — Ambulatory Visit: Payer: Medicaid Other

## 2017-05-23 ENCOUNTER — Ambulatory Visit: Payer: Medicaid Other

## 2017-05-23 NOTE — Telephone Encounter (Signed)
Spoke with [name] regarding resuming her treatment.I informed her she is back on the schedule for 05/23/2017 at 10 am, also if the area on her breast is not healed I informed her to call and let us know that she will not come for treatment.  Cori Razor, RN

## 2017-05-26 ENCOUNTER — Ambulatory Visit: Payer: Medicaid Other

## 2017-05-27 ENCOUNTER — Ambulatory Visit: Payer: Medicaid Other

## 2017-05-28 ENCOUNTER — Ambulatory Visit: Payer: Medicaid Other

## 2017-05-29 ENCOUNTER — Ambulatory Visit
Admission: RE | Admit: 2017-05-29 | Discharge: 2017-05-29 | Disposition: A | Discharge status: Home / Self Care | Payer: Medicaid Other | Source: Ambulatory Visit | Attending: Radiation Oncology | Admitting: Radiation Oncology

## 2017-05-29 ENCOUNTER — Ambulatory Visit: Payer: Medicaid Other

## 2017-05-29 DIAGNOSIS — Z510 Encounter for antineoplastic radiation therapy: Secondary | ICD-10-CM | POA: Insufficient documentation

## 2017-05-29 DIAGNOSIS — Z170 Estrogen receptor positive status [ER+]: Secondary | ICD-10-CM | POA: Insufficient documentation

## 2017-05-29 DIAGNOSIS — C50811 Malignant neoplasm of overlapping sites of right female breast: Secondary | ICD-10-CM | POA: Diagnosis present

## 2017-05-30 ENCOUNTER — Ambulatory Visit: Payer: Medicaid Other

## 2017-05-30 ENCOUNTER — Ambulatory Visit
Admission: RE | Admit: 2017-05-30 | Discharge: 2017-05-30 | Disposition: A | Discharge status: Home / Self Care | Payer: Medicaid Other | Source: Ambulatory Visit | Attending: Radiation Oncology | Admitting: Radiation Oncology

## 2017-05-30 DIAGNOSIS — C50811 Malignant neoplasm of overlapping sites of right female breast: Secondary | ICD-10-CM | POA: Diagnosis not present

## 2017-05-30 DIAGNOSIS — Z170 Estrogen receptor positive status [ER+]: Principal | ICD-10-CM

## 2017-05-30 MED ORDER — ALRA NON-METALLIC DEODORANT (RAD-ONC)
1.0000 "application " | Freq: Once | TOPICAL | Status: AC
  Administered 2017-05-30: 1 via TOPICAL

## 2017-05-30 MED ORDER — RADIAPLEXRX EX GEL
Freq: Once | CUTANEOUS | Status: AC
  Administered 2017-05-30: 10:00:00 via TOPICAL

## 2017-05-30 NOTE — Progress Notes (Signed)
Pt here for patient teaching.  Pt given Radiation and You booklet, skin care instructions, Alra deodorant and Radiaplex gel.  Reviewed areas of pertinence such as fatigue and skin changes . Pt able to give teach back of to pat skin and use unscented/gentle soap,apply Radiaplex bid, avoid applying anything to skin within 4 hours of treatment and to use an electric razor if they must shave. Pt needs reinforcement, no evidence of learning and refused teaching of information given and will contact nursing with any questions or concerns.     Cori Razor, RN

## 2017-06-02 ENCOUNTER — Ambulatory Visit: Payer: Medicaid Other

## 2017-06-03 ENCOUNTER — Ambulatory Visit: Payer: Medicaid Other

## 2017-06-04 ENCOUNTER — Ambulatory Visit
Admission: RE | Admit: 2017-06-04 | Discharge: 2017-06-04 | Disposition: A | Discharge status: Home / Self Care | Payer: Medicaid Other | Source: Ambulatory Visit | Attending: Radiation Oncology | Admitting: Radiation Oncology

## 2017-06-04 DIAGNOSIS — C50811 Malignant neoplasm of overlapping sites of right female breast: Secondary | ICD-10-CM | POA: Diagnosis not present

## 2017-06-05 ENCOUNTER — Ambulatory Visit: Payer: Medicaid Other

## 2017-06-05 ENCOUNTER — Ambulatory Visit
Admission: RE | Admit: 2017-06-05 | Discharge: 2017-06-05 | Disposition: A | Discharge status: Home / Self Care | Payer: Medicaid Other | Source: Ambulatory Visit | Attending: Radiation Oncology | Admitting: Radiation Oncology

## 2017-06-05 DIAGNOSIS — C50811 Malignant neoplasm of overlapping sites of right female breast: Secondary | ICD-10-CM | POA: Diagnosis not present

## 2017-06-06 ENCOUNTER — Ambulatory Visit: Payer: Medicaid Other

## 2017-06-06 ENCOUNTER — Ambulatory Visit
Admission: RE | Admit: 2017-06-06 | Discharge: 2017-06-06 | Disposition: A | Discharge status: Home / Self Care | Payer: Medicaid Other | Source: Ambulatory Visit | Attending: Radiation Oncology | Admitting: Radiation Oncology

## 2017-06-06 DIAGNOSIS — C50811 Malignant neoplasm of overlapping sites of right female breast: Secondary | ICD-10-CM | POA: Diagnosis not present

## 2017-06-09 ENCOUNTER — Ambulatory Visit: Payer: Medicaid Other

## 2017-06-10 ENCOUNTER — Ambulatory Visit
Admission: RE | Admit: 2017-06-10 | Discharge: 2017-06-10 | Disposition: A | Discharge status: Home / Self Care | Payer: Medicaid Other | Source: Ambulatory Visit | Attending: Radiation Oncology | Admitting: Radiation Oncology

## 2017-06-10 DIAGNOSIS — C50811 Malignant neoplasm of overlapping sites of right female breast: Secondary | ICD-10-CM | POA: Diagnosis not present

## 2017-06-11 ENCOUNTER — Ambulatory Visit
Admission: RE | Admit: 2017-06-11 | Discharge: 2017-06-11 | Disposition: A | Discharge status: Home / Self Care | Payer: Medicaid Other | Source: Ambulatory Visit | Attending: Radiation Oncology | Admitting: Radiation Oncology

## 2017-06-11 ENCOUNTER — Ambulatory Visit: Payer: Medicaid Other | Admitting: Radiation Oncology

## 2017-06-11 DIAGNOSIS — C50811 Malignant neoplasm of overlapping sites of right female breast: Secondary | ICD-10-CM | POA: Diagnosis not present

## 2017-06-12 ENCOUNTER — Ambulatory Visit
Admission: RE | Admit: 2017-06-12 | Discharge: 2017-06-12 | Disposition: A | Discharge status: Home / Self Care | Payer: Medicaid Other | Source: Ambulatory Visit | Attending: Radiation Oncology | Admitting: Radiation Oncology

## 2017-06-12 DIAGNOSIS — C50811 Malignant neoplasm of overlapping sites of right female breast: Secondary | ICD-10-CM | POA: Diagnosis not present

## 2017-06-13 ENCOUNTER — Ambulatory Visit
Admission: RE | Admit: 2017-06-13 | Discharge: 2017-06-13 | Disposition: A | Discharge status: Home / Self Care | Payer: Medicaid Other | Source: Ambulatory Visit | Attending: Radiation Oncology | Admitting: Radiation Oncology

## 2017-06-13 DIAGNOSIS — C50811 Malignant neoplasm of overlapping sites of right female breast: Secondary | ICD-10-CM | POA: Diagnosis not present

## 2017-06-16 ENCOUNTER — Ambulatory Visit
Admission: RE | Admit: 2017-06-16 | Discharge: 2017-06-16 | Disposition: A | Discharge status: Home / Self Care | Payer: Medicaid Other | Source: Ambulatory Visit | Attending: Radiation Oncology | Admitting: Radiation Oncology

## 2017-06-16 DIAGNOSIS — C50811 Malignant neoplasm of overlapping sites of right female breast: Secondary | ICD-10-CM | POA: Diagnosis not present

## 2017-06-17 ENCOUNTER — Ambulatory Visit
Admission: RE | Admit: 2017-06-17 | Discharge: 2017-06-17 | Disposition: A | Discharge status: Home / Self Care | Payer: Medicaid Other | Source: Ambulatory Visit | Attending: Radiation Oncology | Admitting: Radiation Oncology

## 2017-06-17 DIAGNOSIS — C50811 Malignant neoplasm of overlapping sites of right female breast: Secondary | ICD-10-CM | POA: Diagnosis not present

## 2017-06-18 ENCOUNTER — Ambulatory Visit
Admission: RE | Admit: 2017-06-18 | Discharge: 2017-06-18 | Disposition: A | Discharge status: Home / Self Care | Payer: Medicaid Other | Source: Ambulatory Visit | Attending: Radiation Oncology | Admitting: Radiation Oncology

## 2017-06-18 DIAGNOSIS — C50811 Malignant neoplasm of overlapping sites of right female breast: Secondary | ICD-10-CM | POA: Diagnosis not present

## 2017-06-19 ENCOUNTER — Ambulatory Visit
Admission: RE | Admit: 2017-06-19 | Discharge: 2017-06-19 | Disposition: A | Discharge status: Home / Self Care | Payer: Medicaid Other | Source: Ambulatory Visit | Attending: Radiation Oncology | Admitting: Radiation Oncology

## 2017-06-19 DIAGNOSIS — C50811 Malignant neoplasm of overlapping sites of right female breast: Secondary | ICD-10-CM | POA: Diagnosis not present

## 2017-06-20 ENCOUNTER — Ambulatory Visit: Payer: Medicaid Other | Admitting: Radiation Oncology

## 2017-06-20 ENCOUNTER — Ambulatory Visit
Admission: RE | Admit: 2017-06-20 | Discharge: 2017-06-20 | Disposition: A | Discharge status: Home / Self Care | Payer: Medicaid Other | Source: Ambulatory Visit | Attending: Radiation Oncology | Admitting: Radiation Oncology

## 2017-06-20 DIAGNOSIS — C50811 Malignant neoplasm of overlapping sites of right female breast: Secondary | ICD-10-CM | POA: Diagnosis not present

## 2017-06-23 ENCOUNTER — Ambulatory Visit
Admission: RE | Admit: 2017-06-23 | Discharge: 2017-06-23 | Disposition: A | Discharge status: Home / Self Care | Payer: Medicaid Other | Source: Ambulatory Visit | Attending: Radiation Oncology | Admitting: Radiation Oncology

## 2017-06-23 ENCOUNTER — Telehealth: Payer: Self-pay | Admitting: Hematology and Oncology

## 2017-06-23 ENCOUNTER — Inpatient Hospital Stay: Payer: Medicaid Other | Attending: Hematology and Oncology | Admitting: Hematology and Oncology

## 2017-06-23 DIAGNOSIS — Z9011 Acquired absence of right breast and nipple: Secondary | ICD-10-CM | POA: Diagnosis not present

## 2017-06-23 DIAGNOSIS — Z923 Personal history of irradiation: Secondary | ICD-10-CM | POA: Insufficient documentation

## 2017-06-23 DIAGNOSIS — C50811 Malignant neoplasm of overlapping sites of right female breast: Secondary | ICD-10-CM | POA: Insufficient documentation

## 2017-06-23 DIAGNOSIS — Z7982 Long term (current) use of aspirin: Secondary | ICD-10-CM | POA: Diagnosis not present

## 2017-06-23 DIAGNOSIS — Z170 Estrogen receptor positive status [ER+]: Secondary | ICD-10-CM | POA: Diagnosis not present

## 2017-06-23 DIAGNOSIS — Z9221 Personal history of antineoplastic chemotherapy: Secondary | ICD-10-CM | POA: Diagnosis not present

## 2017-06-23 DIAGNOSIS — Z79899 Other long term (current) drug therapy: Secondary | ICD-10-CM | POA: Insufficient documentation

## 2017-06-23 DIAGNOSIS — R5383 Other fatigue: Secondary | ICD-10-CM | POA: Insufficient documentation

## 2017-06-23 DIAGNOSIS — Z510 Encounter for antineoplastic radiation therapy: Secondary | ICD-10-CM | POA: Insufficient documentation

## 2017-06-23 DIAGNOSIS — Z79810 Long term (current) use of selective estrogen receptor modulators (SERMs): Secondary | ICD-10-CM | POA: Insufficient documentation

## 2017-06-23 NOTE — Assessment & Plan Note (Signed)
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  Treatment summary: 1. Neoadjuvant chemotherapy with Taxotereand Cytoxan every 3 weeks4completed July 2018 2. 01/30/2017:Right mastectomy: IDC grade 3, 7.2 cm, high-grade DCIS, lymphovascular invasion present including dermal lymphatics, perineural invasion present, margins negative, 4/5 lymph nodes positive with extracapsular extension, ER 100%, PR 100%, HER-2 negative ratio 1.41 3.Adjuvant radiation therapy started 05/29/2017 5. Followed by antiestrogen therapy with tamoxifen (consideration for [place] clinical trial) --------------------------------------------------------------------- Patient is currently undergoing adjuvant radiation therapy which was delayed because of wound healing issues Right breast and axilla discomfort: Related to radiation Fatigue: Radiation related  Return to clinic in August for survivorship care plan visit.

## 2017-06-23 NOTE — Progress Notes (Signed)
Patient Care Team: Javier Docker, MD as PCP - General (Internal Medicine) Troy Sine, MD as PCP - Cardiology (Cardiology) Erroll Luna, MD as Consulting Physician (General Surgery) Nicholas Lose, MD as Consulting Physician (Hematology and Oncology) Kyung Rudd, MD as Consulting Physician (Radiation Oncology) Delice Bison Charlestine Massed, NP as Nurse Practitioner (Hematology and Oncology)  DIAGNOSIS:  Encounter Diagnosis  Name Primary?  . Malignant neoplasm of overlapping sites of right breast in female, estrogen receptor positive ([place])     SUMMARY OF ONCOLOGIC HISTORY:   Malignant neoplasm of overlapping sites of right breast in female, estrogen receptor positive ([place])   07/18/2016 Initial Diagnosis    Palpable right breast masses for 1 year; 2 adjacent spiculated masses by ultrasound at 1:00 subareolar 4.2 cm mass; 3 satellite nodules at 9:00; 2 abnormal lymph nodes; biopsy of the 2 masses and the lymph node showed similar-appearing grade 3 IDC ER 100% PR 95% Ki-67 20-30%, HER-2 negative ratio 1.57; T2 N1 stage IIB (AJCC 8)      08/09/2016 - 10/11/2016 Neo-Adjuvant Chemotherapy    Taxotere and Cytoxan every 3 weeks 4 cycles      08/22/2016 Genetic Testing    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  The Hereditary Gene Panel offered by [facility] includes sequencing and/or deletion duplication testing of the following 46 genes: APC, ATM, AXIN2, BARD1, BMPR1A, BRCA1, BRCA2, BRIP1, CDH1, CDKN2A (p14ARF), CDKN2A (p16INK4a), CHEK2, CTNNA1, DICER1, EPCAM (Deletion/duplication testing only), GREM1 (promoter region deletion/duplication testing only), KIT, MEN1, MLH1, MSH2, MSH3, MSH6, MUTYH, NBN, NF1, NHTL1, PALB2, PDGFRA, PMS2, POLD1, POLE, PTEN, RAD50, RAD51C, RAD51D, SDHB, SDHC, SDHD, SMAD4, SMARCA4. STK11, TP53, TSC1, TSC2, and VHL.  The following genes were evaluated for sequence changes only: SDHA and HOXB13 c.251G>A variant only.  The report date is Aug 22, 2016.       01/01/2017 -  Anti-estrogen oral therapy    Tamoxifen 20 mg daily      01/30/2017 Surgery    Right mastectomy: IDC grade 3, 7.2 cm, high-grade DCIS, lymphovascular invasion present including dermal lymphatics, perineural invasion present, margins negative, 4/5 lymph nodes positive with extracapsular extension, ER 100%, PR 100%, HER-2 negative ratio 1.41      05/29/2017 -  Radiation Therapy    Adjuvant radiation therapy       CHIEF COMPLIANT: Currently on adjuvant radiation therapy  INTERVAL HISTORY: [name] is a 47 year old with above-mentioned history of right breast cancer treated with neoadjuvant chemotherapy followed by mastectomy and is currently on adjuvant radiation.  She is complaining a lot of pain and discomfort in the right breast and axilla.  radiation was slightly delayed because of wound healing issues.  She also complains of fatigue.  REVIEW OF SYSTEMS:   Constitutional: Denies fevers, chills or abnormal weight loss Eyes: Denies blurriness of vision Ears, nose, mouth, throat, and face: Denies mucositis or sore throat Respiratory: Denies cough, dyspnea or wheezes Cardiovascular: Denies palpitation, chest discomfort Gastrointestinal:  Denies nausea, heartburn or change in bowel habits Skin: Denies abnormal skin rashes Lymphatics: Denies new lymphadenopathy or easy bruising Neurological:Denies numbness, tingling or new weaknesses Behavioral/Psych: Mood is stable, no new changes  Extremities: No lower extremity edema Breast: Right breast and axillary discomfort All other systems were reviewed with the patient and are negative.  I have reviewed the past medical history, past surgical history, social history and family history with the patient and they are unchanged from previous note.  ALLERGIES:  is allergic to compazine [  prochlorperazine] and ondansetron hcl.  MEDICATIONS:  Current Outpatient Medications  Medication Sig Dispense Refill  .  acetaminophen (TYLENOL) 500 MG tablet Take 1,000 mg by mouth every 8 (eight) hours as needed for mild pain or headache.    . albuterol (PROVENTIL) (2.5 MG/3ML) 0.083% nebulizer solution Take 3 mLs (2.5 mg total) by nebulization every 6 (six) hours as needed for wheezing or shortness of breath. 75 mL 12  . amLODipine (NORVASC) 10 MG tablet Take 10 mg by mouth daily.    Marland Kitchen aspirin 81 MG chewable tablet Chew 81 mg by mouth daily.    Marland Kitchen atorvastatin (LIPITOR) 80 MG tablet Take 1 tablet (80 mg total) by mouth daily at 6 PM. 30 tablet 0  . isosorbide mononitrate (IMDUR) 60 MG 24 hr tablet Take 1 tablet (60 mg total) by mouth daily. 90 tablet 3  . losartan (COZAAR) 50 MG tablet Take 50 mg by mouth daily.  3  . metoprolol succinate (TOPROL XL) 100 MG 24 hr tablet Take 1 tablet (100 mg total) by mouth daily. Take with or immediately following a meal. 30 tablet 0  . mometasone-formoterol (DULERA) 200-5 MCG/ACT AERO Inhale 2 puffs into the lungs 2 (two) times daily. 1 Inhaler 1  . nitroGLYCERIN (NITROSTAT) 0.4 MG SL tablet Place 0.4 mg under the tongue every 5 (five) minutes as needed for chest pain.    Marland Kitchen PROAIR HFA 108 (90 Base) MCG/ACT inhaler Inhale 2 puffs into the lungs every 6 (six) hours as needed for wheezing or shortness of breath.   3  . venlafaxine XR (EFFEXOR-XR) 37.5 MG 24 hr capsule Take 1 capsule (37.5 mg total) by mouth daily with breakfast. 30 capsule 6   No current facility-administered medications for this visit.     PHYSICAL EXAMINATION: ECOG PERFORMANCE STATUS: 1 - Symptomatic but completely ambulatory  Vitals:   06/23/17 1023  BP: 130/80  Pulse: 63  Resp: 20  Temp: 98.4 F (36.9 C)  SpO2: 98%   Filed Weights   06/23/17 1023  Weight: 165 lb 11.2 oz (75.2 kg)    GENERAL:alert, no distress and comfortable SKIN: skin color, texture, turgor are normal, no rashes or significant lesions EYES: normal, Conjunctiva are pink and non-injected, sclera clear OROPHARYNX:no exudate, no  erythema and lips, buccal mucosa, and tongue normal  NECK: supple, thyroid normal size, non-tender, without nodularity LYMPH:  no palpable lymphadenopathy in the cervical, axillary or inguinal LUNGS: clear to auscultation and percussion with normal breathing effort HEART: regular rate & rhythm and no murmurs and no lower extremity edema ABDOMEN:abdomen soft, non-tender and normal bowel sounds MUSCULOSKELETAL:no cyanosis of digits and no clubbing  NEURO: alert & oriented x 3 with fluent speech, no focal motor/sensory deficits EXTREMITIES: No lower extremity edema  LABORATORY DATA:  I have reviewed the data as listed CMP Latest Ref Rng & Units 03/13/2017 01/23/2017 12/19/2016  Glucose 65 - 99 mg/dL 91 97 88  BUN 6 - 20 mg/dL 7 5(L) 7  Creatinine 0.44 - 1.00 mg/dL 0.68 0.71 0.73  Sodium 135 - 145 mmol/L 140 140 138  Potassium 3.5 - 5.1 mmol/L 3.9 3.4(L) 4.0  Chloride 101 - 111 mmol/L 111 107 107  CO2 22 - 32 mmol/L 20(L) 23 21(L)  Calcium 8.9 - 10.3 mg/dL 9.4 9.4 9.7  Total Protein 6.5 - 8.1 g/dL - - 7.2  Total Bilirubin 0.3 - 1.2 mg/dL - - 0.3  Alkaline Phos 38 - 126 U/L - - 99  AST 15 - 41 U/L - -  18  ALT 14 - 54 U/L - - 13(L)    Lab Results  Component Value Date   WBC 8.3 03/13/2017   HGB 13.0 03/13/2017   HCT 38.6 03/13/2017   MCV 89.1 03/13/2017   PLT 249 03/13/2017   NEUTROABS 5.1 12/19/2016    ASSESSMENT & PLAN:  Malignant neoplasm of overlapping sites of right breast in female, estrogen receptor positive ([place]) 07/18/2016: Palpable right breast masses for 1 year; 2 adjacent spiculated masses by ultrasound at 1:00 subareolar 4.2 cm mass; 3 satellite nodules at 9:00; 2 abnormal lymph nodes; biopsy of the 2 masses and the lymph node showed similar-appearing 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  Treatment  summary: 1. Neoadjuvant chemotherapy with Taxotereand Cytoxan every 3 weeks4completed July 2018 2. 01/30/2017:Right mastectomy: IDC grade 3, 7.2 cm, high-grade DCIS, lymphovascular invasion present including dermal lymphatics, perineural invasion present, margins negative, 4/5 lymph nodes positive with extracapsular extension, ER 100%, PR 100%, HER-2 negative ratio 1.41 3.Adjuvant radiation therapy started 05/29/2017 5. Followed by antiestrogen therapy with tamoxifen (consideration for [place] clinical trial) --------------------------------------------------------------------- Patient is currently undergoing adjuvant radiation therapy which was delayed because of wound healing issues Right breast and axilla discomfort: Related to radiation Fatigue: Radiation related  Return to clinic in August for survivorship care plan visit.  No orders of the defined types were placed in this encounter.  The patient has a good understanding of the overall plan. she agrees with it. she will call with any problems that may develop before the next visit here.   Harriette Ohara, MD 06/23/17

## 2017-06-23 NOTE — Telephone Encounter (Signed)
Gave patient AVs and calendar of upcoming august appointments.  °

## 2017-06-24 ENCOUNTER — Ambulatory Visit: Payer: Medicaid Other

## 2017-06-24 ENCOUNTER — Ambulatory Visit
Admission: RE | Admit: 2017-06-24 | Discharge: 2017-06-24 | Disposition: A | Discharge status: Home / Self Care | Payer: Medicaid Other | Source: Ambulatory Visit | Attending: Radiation Oncology | Admitting: Radiation Oncology

## 2017-06-24 DIAGNOSIS — Z510 Encounter for antineoplastic radiation therapy: Secondary | ICD-10-CM | POA: Diagnosis not present

## 2017-06-25 ENCOUNTER — Ambulatory Visit: Payer: Medicaid Other

## 2017-06-25 ENCOUNTER — Ambulatory Visit
Admission: RE | Admit: 2017-06-25 | Discharge: 2017-06-25 | Disposition: A | Discharge status: Home / Self Care | Payer: Medicaid Other | Source: Ambulatory Visit | Attending: Radiation Oncology | Admitting: Radiation Oncology

## 2017-06-25 DIAGNOSIS — Z510 Encounter for antineoplastic radiation therapy: Secondary | ICD-10-CM | POA: Diagnosis not present

## 2017-06-26 ENCOUNTER — Ambulatory Visit
Admission: RE | Admit: 2017-06-26 | Discharge: 2017-06-26 | Disposition: A | Discharge status: Home / Self Care | Payer: Medicaid Other | Source: Ambulatory Visit | Attending: Radiation Oncology | Admitting: Radiation Oncology

## 2017-06-26 ENCOUNTER — Ambulatory Visit: Payer: Medicaid Other

## 2017-06-26 DIAGNOSIS — Z510 Encounter for antineoplastic radiation therapy: Secondary | ICD-10-CM | POA: Diagnosis not present

## 2017-06-27 ENCOUNTER — Ambulatory Visit
Admission: RE | Admit: 2017-06-27 | Discharge: 2017-06-27 | Disposition: A | Discharge status: Home / Self Care | Payer: Medicaid Other | Source: Ambulatory Visit | Attending: Radiation Oncology | Admitting: Radiation Oncology

## 2017-06-27 ENCOUNTER — Ambulatory Visit: Payer: Medicaid Other

## 2017-06-27 DIAGNOSIS — Z510 Encounter for antineoplastic radiation therapy: Secondary | ICD-10-CM | POA: Diagnosis not present

## 2017-06-30 ENCOUNTER — Ambulatory Visit: Payer: Medicaid Other

## 2017-06-30 ENCOUNTER — Ambulatory Visit
Admission: RE | Admit: 2017-06-30 | Discharge: 2017-06-30 | Disposition: A | Discharge status: Home / Self Care | Payer: Medicaid Other | Source: Ambulatory Visit | Attending: Radiation Oncology | Admitting: Radiation Oncology

## 2017-06-30 DIAGNOSIS — Z510 Encounter for antineoplastic radiation therapy: Secondary | ICD-10-CM | POA: Diagnosis not present

## 2017-07-01 ENCOUNTER — Ambulatory Visit: Payer: Medicaid Other

## 2017-07-01 ENCOUNTER — Ambulatory Visit
Admission: RE | Admit: 2017-07-01 | Discharge: 2017-07-01 | Disposition: A | Discharge status: Home / Self Care | Payer: Medicaid Other | Source: Ambulatory Visit | Attending: Radiation Oncology | Admitting: Radiation Oncology

## 2017-07-01 DIAGNOSIS — Z510 Encounter for antineoplastic radiation therapy: Secondary | ICD-10-CM | POA: Diagnosis not present

## 2017-07-02 ENCOUNTER — Ambulatory Visit: Payer: Medicaid Other

## 2017-07-02 ENCOUNTER — Ambulatory Visit
Admission: RE | Admit: 2017-07-02 | Discharge: 2017-07-02 | Disposition: A | Discharge status: Home / Self Care | Payer: Medicaid Other | Source: Ambulatory Visit | Attending: Radiation Oncology | Admitting: Radiation Oncology

## 2017-07-02 DIAGNOSIS — Z510 Encounter for antineoplastic radiation therapy: Secondary | ICD-10-CM | POA: Diagnosis not present

## 2017-07-03 ENCOUNTER — Ambulatory Visit
Admission: RE | Admit: 2017-07-03 | Discharge: 2017-07-03 | Disposition: A | Discharge status: Home / Self Care | Payer: Medicaid Other | Source: Ambulatory Visit | Attending: Radiation Oncology | Admitting: Radiation Oncology

## 2017-07-03 ENCOUNTER — Ambulatory Visit: Payer: Medicaid Other

## 2017-07-03 DIAGNOSIS — Z510 Encounter for antineoplastic radiation therapy: Secondary | ICD-10-CM | POA: Diagnosis not present

## 2017-07-04 ENCOUNTER — Ambulatory Visit: Payer: Medicaid Other

## 2017-07-04 ENCOUNTER — Ambulatory Visit
Admission: RE | Admit: 2017-07-04 | Discharge: 2017-07-04 | Disposition: A | Discharge status: Home / Self Care | Payer: Medicaid Other | Source: Ambulatory Visit | Attending: Radiation Oncology | Admitting: Radiation Oncology

## 2017-07-04 DIAGNOSIS — Z510 Encounter for antineoplastic radiation therapy: Secondary | ICD-10-CM | POA: Diagnosis not present

## 2017-07-07 ENCOUNTER — Ambulatory Visit
Admission: RE | Admit: 2017-07-07 | Discharge: 2017-07-07 | Disposition: A | Discharge status: Home / Self Care | Payer: Medicaid Other | Source: Ambulatory Visit | Attending: Radiation Oncology | Admitting: Radiation Oncology

## 2017-07-07 DIAGNOSIS — Z510 Encounter for antineoplastic radiation therapy: Secondary | ICD-10-CM | POA: Diagnosis not present

## 2017-07-08 ENCOUNTER — Ambulatory Visit: Payer: Medicaid Other

## 2017-07-09 ENCOUNTER — Ambulatory Visit
Admission: RE | Admit: 2017-07-09 | Discharge: 2017-07-09 | Disposition: A | Discharge status: Home / Self Care | Payer: Medicaid Other | Source: Ambulatory Visit | Attending: Radiation Oncology | Admitting: Radiation Oncology

## 2017-07-09 ENCOUNTER — Ambulatory Visit: Payer: Medicaid Other

## 2017-07-09 DIAGNOSIS — Z510 Encounter for antineoplastic radiation therapy: Secondary | ICD-10-CM | POA: Diagnosis not present

## 2017-07-09 DIAGNOSIS — C50811 Malignant neoplasm of overlapping sites of right female breast: Secondary | ICD-10-CM

## 2017-07-09 DIAGNOSIS — Z170 Estrogen receptor positive status [ER+]: Principal | ICD-10-CM

## 2017-07-09 MED ORDER — RADIAPLEXRX EX GEL
Freq: Once | CUTANEOUS | Status: AC
  Administered 2017-07-09: 16:00:00 via TOPICAL

## 2017-07-10 ENCOUNTER — Ambulatory Visit: Payer: Medicaid Other

## 2017-07-10 ENCOUNTER — Ambulatory Visit
Admission: RE | Admit: 2017-07-10 | Discharge: 2017-07-10 | Disposition: A | Discharge status: Home / Self Care | Payer: Medicaid Other | Source: Ambulatory Visit | Attending: Radiation Oncology | Admitting: Radiation Oncology

## 2017-07-10 DIAGNOSIS — Z510 Encounter for antineoplastic radiation therapy: Secondary | ICD-10-CM | POA: Diagnosis not present

## 2017-07-11 ENCOUNTER — Ambulatory Visit: Payer: Medicaid Other

## 2017-07-11 ENCOUNTER — Ambulatory Visit
Admission: RE | Admit: 2017-07-11 | Discharge: 2017-07-11 | Disposition: A | Discharge status: Home / Self Care | Payer: Medicaid Other | Source: Ambulatory Visit | Attending: Radiation Oncology | Admitting: Radiation Oncology

## 2017-07-11 DIAGNOSIS — Z510 Encounter for antineoplastic radiation therapy: Secondary | ICD-10-CM | POA: Diagnosis not present

## 2017-07-14 ENCOUNTER — Ambulatory Visit: Payer: Medicaid Other

## 2017-07-14 ENCOUNTER — Ambulatory Visit
Admission: RE | Admit: 2017-07-14 | Discharge: 2017-07-14 | Disposition: A | Discharge status: Home / Self Care | Payer: Medicaid Other | Source: Ambulatory Visit | Attending: Radiation Oncology | Admitting: Radiation Oncology

## 2017-07-14 DIAGNOSIS — Z510 Encounter for antineoplastic radiation therapy: Secondary | ICD-10-CM | POA: Diagnosis not present

## 2017-07-15 ENCOUNTER — Ambulatory Visit
Admission: RE | Admit: 2017-07-15 | Discharge: 2017-07-15 | Disposition: A | Discharge status: Home / Self Care | Payer: Medicaid Other | Source: Ambulatory Visit | Attending: Radiation Oncology | Admitting: Radiation Oncology

## 2017-07-15 DIAGNOSIS — Z510 Encounter for antineoplastic radiation therapy: Secondary | ICD-10-CM | POA: Diagnosis not present

## 2017-07-16 ENCOUNTER — Ambulatory Visit
Admission: RE | Admit: 2017-07-16 | Discharge: 2017-07-16 | Disposition: A | Discharge status: Home / Self Care | Payer: Medicaid Other | Source: Ambulatory Visit | Attending: Radiation Oncology | Admitting: Radiation Oncology

## 2017-07-16 ENCOUNTER — Ambulatory Visit: Payer: Medicaid Other

## 2017-07-16 DIAGNOSIS — Z510 Encounter for antineoplastic radiation therapy: Secondary | ICD-10-CM | POA: Diagnosis not present

## 2017-07-17 ENCOUNTER — Ambulatory Visit: Payer: Medicaid Other

## 2017-07-17 ENCOUNTER — Ambulatory Visit
Admission: RE | Admit: 2017-07-17 | Discharge: 2017-07-17 | Disposition: A | Discharge status: Home / Self Care | Payer: Medicaid Other | Source: Ambulatory Visit | Attending: Radiation Oncology | Admitting: Radiation Oncology

## 2017-07-17 DIAGNOSIS — Z510 Encounter for antineoplastic radiation therapy: Secondary | ICD-10-CM | POA: Diagnosis not present

## 2017-07-18 ENCOUNTER — Encounter: Payer: Self-pay | Admitting: Radiation Oncology

## 2017-07-18 ENCOUNTER — Ambulatory Visit
Admission: RE | Admit: 2017-07-18 | Discharge: 2017-07-18 | Disposition: A | Discharge status: Home / Self Care | Payer: Medicaid Other | Source: Ambulatory Visit | Attending: Radiation Oncology | Admitting: Radiation Oncology

## 2017-07-18 DIAGNOSIS — Z510 Encounter for antineoplastic radiation therapy: Secondary | ICD-10-CM | POA: Diagnosis not present

## 2017-08-07 NOTE — Progress Notes (Signed)
  Radiation Oncology         [id number]) [phone] ________________________________  Name: [name] MRN: [id number]  Date: 07/18/2017  DOB: [birth year]  End of Treatment Note  Diagnosis:   47 y.o. female with Stage IIB, T2, N1, Mx grade 3, ER/PR positive invasive ductal carcinoma of the right breast    Indication for treatment:  Curative       Radiation treatment dates:   05/29/2017 - 07/18/2017  Site/dose:   The patient initially received a dose of 50.4 Gy in 28 fractions to the right chest wall and supraclavicular region. This was delivered using a 3-D conformal, 4 field technique. The patient then received a boost to the mastectomy scar. This delivered an additional 10 Gy in 5 fractions using an en face electron field. The total dose was 60.4 Gy.  Narrative: The patient tolerated radiation treatment relatively well.   The patient had some expected skin irritation as she progressed during treatment. Moist desquamation was present at the end of treatment. She was advised to apply neosporin to the affected area.  Plan: The patient has completed radiation treatment. The patient will return to radiation oncology clinic for routine followup in one month. I advised the patient to call or return sooner if they have any questions or concerns related to their recovery or treatment. ________________________________  Jodelle Gross, MD, PhD  This document serves as a record of services personally performed by Kyung Rudd, MD. It was created on his behalf by Rae Lips, a trained medical scribe. The creation of this record is based on the scribe's personal observations and the provider's statements to them. This document has been checked and approved by the attending provider.

## 2017-08-21 ENCOUNTER — Ambulatory Visit
Admission: RE | Admit: 2017-08-21 | Discharge: 2017-08-21 | Disposition: A | Discharge status: Home / Self Care | Payer: Medicaid Other | Source: Ambulatory Visit | Attending: Radiation Oncology | Admitting: Radiation Oncology

## 2017-08-21 DIAGNOSIS — C50811 Malignant neoplasm of overlapping sites of right female breast: Secondary | ICD-10-CM

## 2017-08-21 DIAGNOSIS — Z170 Estrogen receptor positive status [ER+]: Principal | ICD-10-CM

## 2017-10-07 ENCOUNTER — Emergency Department (HOSPITAL_COMMUNITY): Payer: Medicaid Other

## 2017-10-07 ENCOUNTER — Other Ambulatory Visit: Payer: Self-pay

## 2017-10-07 ENCOUNTER — Emergency Department (HOSPITAL_COMMUNITY)
Admission: EM | Admit: 2017-10-07 | Discharge: 2017-10-07 | Disposition: A | Discharge status: Home / Self Care | Payer: Medicaid Other | Attending: Emergency Medicine | Admitting: Emergency Medicine

## 2017-10-07 ENCOUNTER — Encounter (HOSPITAL_COMMUNITY): Payer: Self-pay

## 2017-10-07 DIAGNOSIS — Z79899 Other long term (current) drug therapy: Secondary | ICD-10-CM | POA: Diagnosis not present

## 2017-10-07 DIAGNOSIS — F17210 Nicotine dependence, cigarettes, uncomplicated: Secondary | ICD-10-CM | POA: Diagnosis not present

## 2017-10-07 DIAGNOSIS — I2510 Atherosclerotic heart disease of native coronary artery without angina pectoris: Secondary | ICD-10-CM | POA: Diagnosis not present

## 2017-10-07 DIAGNOSIS — S21001A Unspecified open wound of right breast, initial encounter: Secondary | ICD-10-CM | POA: Insufficient documentation

## 2017-10-07 DIAGNOSIS — Y999 Unspecified external cause status: Secondary | ICD-10-CM | POA: Insufficient documentation

## 2017-10-07 DIAGNOSIS — Y939 Activity, unspecified: Secondary | ICD-10-CM | POA: Diagnosis not present

## 2017-10-07 DIAGNOSIS — Y929 Unspecified place or not applicable: Secondary | ICD-10-CM | POA: Insufficient documentation

## 2017-10-07 DIAGNOSIS — S299XXA Unspecified injury of thorax, initial encounter: Secondary | ICD-10-CM | POA: Diagnosis present

## 2017-10-07 DIAGNOSIS — X58XXXA Exposure to other specified factors, initial encounter: Secondary | ICD-10-CM | POA: Diagnosis not present

## 2017-10-07 DIAGNOSIS — Z9011 Acquired absence of right breast and nipple: Secondary | ICD-10-CM | POA: Diagnosis not present

## 2017-10-07 DIAGNOSIS — Z8572 Personal history of non-Hodgkin lymphomas: Secondary | ICD-10-CM | POA: Diagnosis not present

## 2017-10-07 DIAGNOSIS — I10 Essential (primary) hypertension: Secondary | ICD-10-CM | POA: Insufficient documentation

## 2017-10-07 DIAGNOSIS — Z853 Personal history of malignant neoplasm of breast: Secondary | ICD-10-CM | POA: Insufficient documentation

## 2017-10-07 DIAGNOSIS — J45909 Unspecified asthma, uncomplicated: Secondary | ICD-10-CM | POA: Insufficient documentation

## 2017-10-07 DIAGNOSIS — Z7982 Long term (current) use of aspirin: Secondary | ICD-10-CM | POA: Diagnosis not present

## 2017-10-07 DIAGNOSIS — S21009A Unspecified open wound of unspecified breast, initial encounter: Secondary | ICD-10-CM

## 2017-10-07 LAB — CBC
HEMATOCRIT: 41.8 % (ref 36.0–46.0)
HEMOGLOBIN: 13.7 g/dL (ref 12.0–15.0)
MCH: 30.2 pg (ref 26.0–34.0)
MCHC: 32.8 g/dL (ref 30.0–36.0)
MCV: 92.3 fL (ref 78.0–100.0)
Platelets: 267 10*3/uL (ref 150–400)
RBC: 4.53 MIL/uL (ref 3.87–5.11)
RDW: 14.3 % (ref 11.5–15.5)
WBC: 12.2 10*3/uL — AB (ref 4.0–10.5)

## 2017-10-07 LAB — BASIC METABOLIC PANEL
ANION GAP: 12 (ref 5–15)
BUN: 7 mg/dL (ref 6–20)
CALCIUM: 9.6 mg/dL (ref 8.9–10.3)
CO2: 20 mmol/L — ABNORMAL LOW (ref 22–32)
Chloride: 109 mmol/L (ref 98–111)
Creatinine, Ser: 0.64 mg/dL (ref 0.44–1.00)
GFR calc Af Amer: >60 mL/min (ref >60)
Glucose, Bld: 98 mg/dL (ref 70–99)
POTASSIUM: 3.8 mmol/L (ref 3.5–5.1)
SODIUM: 141 mmol/L (ref 135–145)

## 2017-10-07 LAB — I-STAT TROPONIN, ED: TROPONIN I, POC: 0 ng/mL (ref 0.00–0.08)

## 2017-10-07 MED ORDER — OXYCODONE-ACETAMINOPHEN 5-325 MG PO TABS: 1.0000 | ORAL_TABLET | Freq: Four times a day (QID) | ORAL | 0 refills | Status: DC | PRN

## 2017-10-07 MED ORDER — HYDROMORPHONE HCL 1 MG/ML IJ SOLN
1.0000 mg | Freq: Once | INTRAMUSCULAR | Status: AC
  Administered 2017-10-07: 1 mg via INTRAVENOUS
  Filled 2017-10-07: qty 1

## 2017-10-07 MED ORDER — IOHEXOL 300 MG/ML  SOLN
100.0000 mL | Freq: Once | INTRAMUSCULAR | Status: AC | PRN
  Administered 2017-10-07: 100 mL via INTRAVENOUS

## 2017-10-07 MED ORDER — SULFAMETHOXAZOLE-TRIMETHOPRIM 800-160 MG PO TABS: 2.0000 | ORAL_TABLET | Freq: Two times a day (BID) | ORAL | 0 refills | Status: AC

## 2017-10-07 NOTE — ED Notes (Signed)
Discharge instructions and prescriptions discussed with Pt. Pt verbalized understanding. Pt stable and ambulatory.   

## 2017-10-07 NOTE — Discharge Instructions (Signed)
Return here as needed.  Follow-up with Dr. Marla Roe tomorrow 2 PM at her office.

## 2017-10-07 NOTE — ED Triage Notes (Signed)
Pt states she had a right mastectomy and her wound has been leaking and very painful. She states using tylenol without relief. Pt states the pain radiates from her left breast around to the right side of her back.

## 2017-10-07 NOTE — ED Provider Notes (Addendum)
[facility] EMERGENCY DEPARTMENT Provider Note   CSN: [id number] Arrival date & time: 10/07/17  0736     History   Chief Complaint Chief Complaint  Patient presents with  . Breast Pain  . Chest Pain    HPI [name] is a 47 y.o. female.  HPI Patient presents to the emergency department with a right breast wound.  This area has been present over the last month but worse over the last 2 weeks.  Patient states that initially she had a wound that opened up over the mastectomy site and then that healed but now this area has re-formed.  Patient states that she has not seen anyone since in May for these areas and wound.  Patient states that nothing seems to make the condition better but palpation and movement make the pain worse.  The patient denies chest pain, shortness of breath, headache,blurred vision, neck pain, fever, cough, weakness, numbness, dizziness, anorexia, edema, abdominal pain, nausea, vomiting, diarrhea, rash, back pain, dysuria, hematemesis, bloody stool, near syncope, or syncope. Past Medical History:  Diagnosis Date  . Asthma    beginning of 2018 - came to ER    . Bell's palsy   . Breast cancer ([place]) 01/2017  . Cardiomegaly   . Coronary artery disease    NSTEMI 08/2015 (65% LAD; D1 50%)  . Family history of breast cancer   . Family history of prostate cancer   . 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 GERD (gastroesophageal reflux disease)   . Hypertension   . 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 Renal artery stenosis [facility])    s/p stent placment to left     Patient Active Problem List   Diagnosis Date Noted  . Renal artery stenosis ([place])   . Hypertension   . GERD (gastroesophageal reflux disease)   . Genetic testing of female   . Coronary artery disease   . Cardiomegaly   . Bell's palsy   . Essential (primary) hypertension 12/24/2016  . Genetic testing 08/23/2016  . Family history of breast cancer   . Family  history of prostate cancer   . Malignant neoplasm of overlapping sites of right breast in female, estrogen receptor positive ([place]) 07/23/2016  . CAD at cath 09/18/15 09/28/2015  . Chest pain at rest 09/18/2015  . Hypertensive urgency 09/18/2015  . NSTEMI (non-ST elevated myocardial infarction) ([place]) 09/18/2015  . Hypokalemia 09/18/2015  . Asthma 09/18/2015  . History of lymphoma 09/18/2015  . Hypertensive emergency without congestive heart failure   . Left renal artery stenosis ([place]) 08/23/2014  . 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 Headache above the eye region 08/16/2014  . Blurry vision 08/16/2014  . Perceived hearing changes 08/16/2014  . Snoring 08/16/2014  . Witnessed apneic spells 08/16/2014  . Excessive daytime sleepiness 08/16/2014  . Nocturnal headaches 08/16/2014    Past Surgical History:  Procedure Laterality Date  . AXILLARY LYMPH NODE DISSECTION Right 03/13/2017   Procedure: AXILLARY LYMPH NODE DISSECTION;  Surgeon: Erroll Luna, MD;  Location: [facility];  Service: General;  Laterality: Right;  . BREAST RECONSTRUCTION WITH PLACEMENT OF TISSUE EXPANDER AND FLEX HD (ACELLULAR HYDRATED DERMIS) Right 01/30/2017   Procedure: RIGHT BREAST RECONSTRUCTION WITH PLACEMENT OF TISSUE EXPANDER AND FLEX HD (ACELLULAR HYDRATED DERMIS);  Surgeon: Wallace Going, DO;  Location: [facility];  Service: Plastics;  Laterality: Right;  . BREAST SURGERY     breast bx  07/2016  . CARDIAC CATHETERIZATION N/A 09/18/2015   Procedure: Left Heart Cath and Coronary Angiography;  Surgeon: Leonie Man, MD;  Location: [facility] CV LAB;  Service: Cardiovascular;  Laterality: N/A;  . [place]   x2  . IR CV LINE INJECTION  07/24/2016  . MASTECTOMY WITH RADIOACTIVE SEED GUIDED EXCISION AND AXILLARY SENTINEL LYMPH NODE BIOPSY Right 01/30/2017   Procedure: RIGHT SIMPLE MASTECTOMY WITH  RADIOACTIVE SEED TARGETED RIGHT AXILLARY LYMPH NODE EXCISION AND RIGHT AXILLARY SENTINEL LYMPH NODE  BIOPSY;  Surgeon: Erroll Luna, MD;  Location: [facility];  Service: General;  Laterality: Right;  . PERIPHERAL VASCULAR CATHETERIZATION N/A 08/23/2014   Procedure: Renal Angiography;  Surgeon: Adrian Prows, MD;  Location: [place] CV LAB;  Service: Cardiovascular;  Laterality: N/A;  . RENAL ARTERY STENT Left 08/23/2014  . SIMPLE MASTECTOMY Right 01/30/2017  . SKIN BIOPSY  2000  . TUBAL LIGATION       OB History   None      Home Medications    Prior to Admission medications   Medication Sig Start Date End Date Taking? Authorizing Provider  acetaminophen (TYLENOL) 500 MG tablet Take 1,000 mg by mouth every 8 (eight) hours as needed for mild pain or headache.   Yes [provider]  albuterol (PROVENTIL) (2.5 MG/3ML) 0.083% nebulizer solution Take 3 mLs (2.5 mg total) by nebulization every 6 (six) hours as needed for wheezing or shortness of breath. 04/12/16  Yes Tanna Furry, MD  amLODipine (NORVASC) 10 MG tablet Take 10 mg by mouth daily.   Yes [provider]  aspirin 81 MG chewable tablet Chew 81 mg by mouth daily.   Yes [provider]  atorvastatin (LIPITOR) 80 MG tablet Take 1 tablet (80 mg total) by mouth daily at 6 PM. 09/20/15  Yes Florencia Reasons, MD  cetirizine (ZYRTEC) 10 MG tablet Take 10 mg by mouth every evening. 09/29/17  Yes [provider]  [employer] D3 5000 units capsule Take 5,000 Units by mouth daily. 09/29/17  Yes [provider]  ipratropium (ATROVENT) 0.03 % nasal spray Place 2 sprays into both nostrils 2 (two) times daily as needed. unk 09/29/17  Yes [provider]  isosorbide mononitrate (IMDUR) 60 MG 24 hr tablet Take 1 tablet (60 mg total) by mouth daily. 12/24/16 10/07/17 Yes Daune Perch, NP  lidocaine-prilocaine (EMLA) cream Apply 1 application topically daily as needed. Apply to affected area 07/15/17  Yes [provider]  losartan (COZAAR) 50 MG tablet Take 50 mg by mouth daily. 06/28/15  Yes [provider]    metoprolol succinate (TOPROL XL) 100 MG 24 hr tablet Take 1 tablet (100 mg total) by mouth daily. Take with or immediately following a meal. 09/20/15  Yes Florencia Reasons, MD  mometasone-formoterol [facility]) 200-5 MCG/ACT AERO Inhale 2 puffs into the lungs 2 (two) times daily. 04/12/16  Yes Tanna Furry, MD  nitroGLYCERIN (NITROSTAT) 0.4 MG SL tablet Place 0.4 mg under the tongue every 5 (five) minutes as needed for chest pain.   Yes [provider]  PROAIR HFA 108 (90 Base) MCG/ACT inhaler Inhale 2 puffs into the lungs every 6 (six) hours as needed for wheezing or shortness of breath.  11/01/16  Yes [provider]  venlafaxine XR (EFFEXOR-XR) 37.5 MG 24 hr capsule Take 1 capsule (37.5 mg total) by mouth daily with breakfast. 01/01/17  Yes Nicholas Lose, MD    Family History Family History  Problem Relation Age of Onset  .  Hypertension Mother   . Cervical cancer Mother        hysterectomy with ?metastatic disease  . Coronary artery disease Father 104       CABG  . Asthma Father   . Breast cancer Maternal Aunt        age at diagnosis unknown  . Prostate cancer Cousin        paternal first cousin  . Migraines Neg Hx     Social History Social History   Tobacco Use  . Smoking status: Current Every Day Smoker    Packs/day: 0.50    Years: 15.00    Pack years: 7.50    Types: Cigarettes  . Smokeless tobacco: Never Used  . Tobacco comment: 6 cig per day  Substance Use Topics  . Alcohol use: No    Alcohol/week: 0.0 oz  . Drug use: No     Allergies   Compazine [prochlorperazine] and Ondansetron hcl   Review of Systems Review of Systems All other systems negative except as documented in the HPI. All pertinent positives and negatives as reviewed in the HPI.  Physical Exam Updated Vital Signs BP (!) 160/84   Pulse 81   Temp 98.7 F (37.1 C) (Oral)   Resp 16   SpO2 95%   Physical Exam  Constitutional: She is oriented to person, place, and time. She appears  well-developed and well-nourished. No distress.  HENT:  Head: Normocephalic and atraumatic.  Mouth/Throat: Oropharynx is clear and moist.  Eyes: Pupils are equal, round, and reactive to light.  Neck: Normal range of motion. Neck supple.  Cardiovascular: Normal rate, regular rhythm and normal heart sounds. Exam reveals no gallop and no friction rub.  No murmur heard. Pulmonary/Chest: Effort normal and breath sounds normal. No respiratory distress. She has no wheezes.    Neurological: She is alert and oriented to person, place, and time. She is not disoriented. She exhibits normal muscle tone. Coordination normal.  Skin: Skin is warm and dry. Capillary refill takes less than 2 seconds. No rash noted. No erythema.  Psychiatric: She has a normal mood and affect. Her behavior is normal.  Nursing note and vitals reviewed.    ED Treatments / Results  Labs (all labs ordered are listed, but only abnormal results are displayed) Labs Reviewed  BASIC METABOLIC PANEL - Abnormal; Notable for the following components:      Result Value   CO2 20 (*)    All other components within normal limits  CBC - Abnormal; Notable for the following components:   WBC 12.2 (*)    All other components within normal limits  I-STAT TROPONIN, ED    EKG EKG Interpretation  Date/Time:  Tuesday October 07 2017 07:46:36 EDT Ventricular Rate:  96 PR Interval:  132 QRS Duration: 86 QT Interval:  362 QTC Calculation: 457 R Axis:   73 Text Interpretation:  Normal sinus rhythm Possible Inferior infarct , age undetermined Anterior infarct , age undetermined Abnormal ECG When compared with ECG of 04/12/2016, No significant change was found Confirmed by Delora Fuel ([id number]) on 10/07/2017 7:49:36 AM Also confirmed by Delora Fuel ([id number]), editor Philomena Doheny [phone])  on 10/07/2017 9:01:50 AM   Radiology Dg Chest 2 View  Result Date: 10/07/2017 CLINICAL DATA:  Chest pain.  History of breast carcinoma EXAM: CHEST - 2 VIEW  COMPARISON:  April 12, 2016 chest radiograph and chest CT April 10, 2017 FINDINGS: There is no edema or consolidation. The heart size and pulmonary vascularity are normal.  No adenopathy. Port-A-Cath tip is in the superior vena cava. No pneumothorax. Breast tissue expander noted on the right. No bone lesions. IMPRESSION: No edema or consolidation.  No evident adenopathy. Electronically Signed   By: Lowella Grip III M.D.   On: 10/07/2017 08:12   Ct Chest W Contrast  Result Date: 10/07/2017 CLINICAL DATA:  Chest pain, recent mastectomy and draining open wound, history of breast carcinoma with completion of chemotherapy in May of 2018 EXAM: CT CHEST WITH CONTRAST TECHNIQUE: Multidetector CT imaging of the chest was performed during intravenous contrast administration. CONTRAST:  159m OMNIPAQUE IOHEXOL 300 MG/ML  SOLN COMPARISON:  Chest x-ray of 10/07/2017, and CT chest of 04/10/2017 FINDINGS: Cardiovascular: The heart is within upper limits of normal. No definite pericardial effusion is seen. However there are diffuse coronary artery calcifications for age. The mid ascending thoracic aorta measures 29 mm in diameter. There is some opacification of pulmonary arteries as well. No central embolus is seen. Mediastinum/Nodes: No mediastinal or hilar adenopathy is noted. The thyroid gland is unremarkable. There does appear to be a small hiatal hernia present. Lungs/Pleura: On lung window images there are diffuse changes of primarily centrilobular emphysema. A single 5 mm noncalcified nodule is noted anteriorly within the right upper lobe. This nodule is not seen in comparison with the prior CT from earlier this year. No other lung nodule is seen. No parenchymal infiltrate is noted and there is no evidence of pleural effusion. Bibasilar dependent atelectasis is present. Upper Abdomen: A right breast implant is present and there is considerable surrounding soft tissue which may indicate cellulitis. This  surrounding soft tissue was not present the prior CT and may well be inflammatory in nature. No discrete abscess is seen. There is an indentation lateral to the expected position of the nipple, probably due to the history of open wound. Also, there is a left axillary lymph node on image 37 series 3 measuring 11 mm in diameter, unchanged compared to the prior CT. The upper abdomen is unremarkable. Musculoskeletal: No acute bony abnormality is seen. IMPRESSION: 1. There is considerably more soft tissue surrounding the right breast implant possibly inflammatory in nature. No discrete abscess is seen. The implant may be partially ruptured compared to the prior images. 2. New 5 mm noncalcified nodule in the anterior right upper lobe not present on the most recent CT of 04/10/2017. Consider close follow-up in view of the patient's history. 3. Age advanced coronary artery calcifications. 4. Centrilobular emphysema. Electronically Signed   By: PIvar DrapeM.D.   On: 10/07/2017 13:37    Procedures Procedures (including critical care time)  Medications Ordered in ED Medications  HYDROmorphone (DILAUDID) injection 1 mg (1 mg Intravenous Given 10/07/17 1003)  HYDROmorphone (DILAUDID) injection 1 mg (1 mg Intravenous Given 10/07/17 1144)  iohexol (OMNIPAQUE) 300 MG/ML solution 100 mL (100 mLs Intravenous Contrast Given 10/07/17 1153)     Initial Impression / Assessment and Plan / ED Course  I have reviewed the triage vital signs and the nursing notes.  Pertinent labs & imaging results that were available during my care of the patient were reviewed by me and considered in my medical decision making (see chart for details).     I spoke with Dr.Dillingham's office who spoke with her and she will see her tomorrow afternoon at 2 PM she would like for her to be started on Bactrim.  Patient is advised to the plan and all questions were answered we will place a wet-to-dry dressing.  Final Clinical Impressions(s) / ED  Diagnoses   Final diagnoses:  None    ED Discharge Orders    None       Dalia Heading, PA-C 10/07/17 1509    Dalia Heading, PA-C 10/07/17 1513    Drenda Freeze, MD 10/09/17 1729

## 2017-10-08 ENCOUNTER — Encounter (HOSPITAL_COMMUNITY)
Admission: RE | Disposition: A | Discharge status: Home / Self Care | Payer: Self-pay | Source: Ambulatory Visit | Attending: Plastic Surgery

## 2017-10-08 ENCOUNTER — Ambulatory Visit (HOSPITAL_COMMUNITY)
Admission: RE | Admit: 2017-10-08 | Discharge: 2017-10-08 | Disposition: A | Discharge status: Home / Self Care | Payer: Medicaid Other | Source: Ambulatory Visit | Attending: Plastic Surgery | Admitting: Plastic Surgery

## 2017-10-08 ENCOUNTER — Other Ambulatory Visit: Payer: Self-pay

## 2017-10-08 ENCOUNTER — Encounter (HOSPITAL_COMMUNITY): Payer: Self-pay | Admitting: *Deleted

## 2017-10-08 ENCOUNTER — Ambulatory Visit (HOSPITAL_COMMUNITY): Payer: Medicaid Other | Admitting: Anesthesiology

## 2017-10-08 ENCOUNTER — Ambulatory Visit: Payer: Self-pay | Admitting: Plastic Surgery

## 2017-10-08 DIAGNOSIS — K219 Gastro-esophageal reflux disease without esophagitis: Secondary | ICD-10-CM | POA: Insufficient documentation

## 2017-10-08 DIAGNOSIS — Z9011 Acquired absence of right breast and nipple: Secondary | ICD-10-CM | POA: Diagnosis not present

## 2017-10-08 DIAGNOSIS — Z79899 Other long term (current) drug therapy: Secondary | ICD-10-CM | POA: Diagnosis not present

## 2017-10-08 DIAGNOSIS — I2510 Atherosclerotic heart disease of native coronary artery without angina pectoris: Secondary | ICD-10-CM | POA: Insufficient documentation

## 2017-10-08 DIAGNOSIS — T85898A Other specified complication of other internal prosthetic devices, implants and grafts, initial encounter: Secondary | ICD-10-CM | POA: Insufficient documentation

## 2017-10-08 DIAGNOSIS — Z7982 Long term (current) use of aspirin: Secondary | ICD-10-CM | POA: Insufficient documentation

## 2017-10-08 DIAGNOSIS — Z8572 Personal history of non-Hodgkin lymphomas: Secondary | ICD-10-CM | POA: Insufficient documentation

## 2017-10-08 DIAGNOSIS — N61 Mastitis without abscess: Secondary | ICD-10-CM | POA: Insufficient documentation

## 2017-10-08 DIAGNOSIS — Z888 Allergy status to other drugs, medicaments and biological substances status: Secondary | ICD-10-CM | POA: Diagnosis not present

## 2017-10-08 DIAGNOSIS — I252 Old myocardial infarction: Secondary | ICD-10-CM | POA: Diagnosis not present

## 2017-10-08 DIAGNOSIS — Z923 Personal history of irradiation: Secondary | ICD-10-CM | POA: Diagnosis not present

## 2017-10-08 DIAGNOSIS — I10 Essential (primary) hypertension: Secondary | ICD-10-CM | POA: Diagnosis not present

## 2017-10-08 DIAGNOSIS — J45909 Unspecified asthma, uncomplicated: Secondary | ICD-10-CM | POA: Diagnosis not present

## 2017-10-08 DIAGNOSIS — Z853 Personal history of malignant neoplasm of breast: Secondary | ICD-10-CM | POA: Insufficient documentation

## 2017-10-08 DIAGNOSIS — Y813 Surgical instruments, materials and general- and plastic-surgery devices (including sutures) associated with adverse incidents: Secondary | ICD-10-CM | POA: Diagnosis not present

## 2017-10-08 DIAGNOSIS — Z45811 Encounter for adjustment or removal of right breast implant: Secondary | ICD-10-CM

## 2017-10-08 HISTORY — DX: Acute myocardial infarction, unspecified: I21.9

## 2017-10-08 HISTORY — PX: TISSUE EXPANDER PLACEMENT: SHX2530

## 2017-10-08 SURGERY — INSERTION, TISSUE EXPANDER
Anesthesia: General | Site: Breast | Laterality: Right

## 2017-10-08 MED ORDER — PROPOFOL 10 MG/ML IV BOLUS
INTRAVENOUS | Status: DC | PRN
  Administered 2017-10-08: 200 mg via INTRAVENOUS

## 2017-10-08 MED ORDER — SODIUM CHLORIDE 0.9 % IV SOLN: 250.0000 mL | INTRAVENOUS | Status: DC | PRN

## 2017-10-08 MED ORDER — ACETAMINOPHEN 650 MG RE SUPP: 650.0000 mg | RECTAL | Status: DC | PRN

## 2017-10-08 MED ORDER — PHENYLEPHRINE 40 MCG/ML (10ML) SYRINGE FOR IV PUSH (FOR BLOOD PRESSURE SUPPORT)
PREFILLED_SYRINGE | INTRAVENOUS | Status: DC | PRN
  Administered 2017-10-08: 80 ug via INTRAVENOUS
  Administered 2017-10-08 (×2): 120 ug via INTRAVENOUS

## 2017-10-08 MED ORDER — OXYCODONE HCL 5 MG PO TABS
5.0000 mg | ORAL_TABLET | ORAL | Status: DC | PRN
  Administered 2017-10-08: 5 mg via ORAL

## 2017-10-08 MED ORDER — DEXAMETHASONE SODIUM PHOSPHATE 10 MG/ML IJ SOLN
INTRAMUSCULAR | Status: AC
  Filled 2017-10-08: qty 1

## 2017-10-08 MED ORDER — 0.9 % SODIUM CHLORIDE (POUR BTL) OPTIME
TOPICAL | Status: DC | PRN
  Administered 2017-10-08: 1000 mL

## 2017-10-08 MED ORDER — FENTANYL CITRATE (PF) 250 MCG/5ML IJ SOLN
INTRAMUSCULAR | Status: DC | PRN
  Administered 2017-10-08 (×3): 25 ug via INTRAVENOUS
  Administered 2017-10-08: 50 ug via INTRAVENOUS

## 2017-10-08 MED ORDER — SODIUM CHLORIDE 0.9% FLUSH: 3.0000 mL | Freq: Two times a day (BID) | INTRAVENOUS | Status: DC

## 2017-10-08 MED ORDER — MUPIROCIN 2 % EX OINT
TOPICAL_OINTMENT | CUTANEOUS | Status: AC
  Filled 2017-10-08: qty 22

## 2017-10-08 MED ORDER — MIDAZOLAM HCL 2 MG/2ML IJ SOLN
INTRAMUSCULAR | Status: DC | PRN
  Administered 2017-10-08: 1 mg via INTRAVENOUS

## 2017-10-08 MED ORDER — MIDAZOLAM HCL 2 MG/2ML IJ SOLN
INTRAMUSCULAR | Status: AC
  Filled 2017-10-08: qty 2

## 2017-10-08 MED ORDER — LACTATED RINGERS IV SOLN
INTRAVENOUS | Status: DC | PRN
  Administered 2017-10-08: 15:00:00 via INTRAVENOUS

## 2017-10-08 MED ORDER — SODIUM CHLORIDE 0.9 % IV SOLN
INTRAVENOUS | Status: DC | PRN
  Administered 2017-10-08: 45 ug/min via INTRAVENOUS

## 2017-10-08 MED ORDER — PHENYLEPHRINE 40 MCG/ML (10ML) SYRINGE FOR IV PUSH (FOR BLOOD PRESSURE SUPPORT)
PREFILLED_SYRINGE | INTRAVENOUS | Status: AC
  Filled 2017-10-08: qty 20

## 2017-10-08 MED ORDER — HYDROMORPHONE HCL 1 MG/ML IJ SOLN
INTRAMUSCULAR | Status: AC
  Filled 2017-10-08: qty 1

## 2017-10-08 MED ORDER — DEXAMETHASONE SODIUM PHOSPHATE 10 MG/ML IJ SOLN
INTRAMUSCULAR | Status: DC | PRN
  Administered 2017-10-08: 4 mg via INTRAVENOUS

## 2017-10-08 MED ORDER — SODIUM CHLORIDE 0.9% FLUSH: 3.0000 mL | INTRAVENOUS | Status: DC | PRN

## 2017-10-08 MED ORDER — LIDOCAINE HCL (CARDIAC) PF 100 MG/5ML IV SOSY
PREFILLED_SYRINGE | INTRAVENOUS | Status: DC | PRN
  Administered 2017-10-08: 100 mg via INTRAVENOUS

## 2017-10-08 MED ORDER — SCOPOLAMINE 1 MG/3DAYS TD PT72
1.0000 | MEDICATED_PATCH | TRANSDERMAL | Status: DC
  Administered 2017-10-08: 1.5 mg via TRANSDERMAL
  Filled 2017-10-08: qty 1

## 2017-10-08 MED ORDER — ONDANSETRON HCL 4 MG/2ML IJ SOLN
INTRAMUSCULAR | Status: AC
  Filled 2017-10-08: qty 2

## 2017-10-08 MED ORDER — CEFAZOLIN SODIUM-DEXTROSE 2-4 GM/100ML-% IV SOLN
2.0000 g | INTRAVENOUS | Status: AC
  Administered 2017-10-08: 2 g via INTRAVENOUS

## 2017-10-08 MED ORDER — LACTATED RINGERS IV SOLN
Freq: Once | INTRAVENOUS | Status: AC
  Administered 2017-10-08: 13:00:00 via INTRAVENOUS

## 2017-10-08 MED ORDER — FENTANYL CITRATE (PF) 100 MCG/2ML IJ SOLN
INTRAMUSCULAR | Status: AC
  Administered 2017-10-08: 25 ug via INTRAVENOUS
  Filled 2017-10-08: qty 2

## 2017-10-08 MED ORDER — SODIUM CHLORIDE 0.9 % IV SOLN
INTRAVENOUS | Status: AC
  Filled 2017-10-08: qty 500000

## 2017-10-08 MED ORDER — PROPOFOL 10 MG/ML IV BOLUS
INTRAVENOUS | Status: AC
  Filled 2017-10-08: qty 20

## 2017-10-08 MED ORDER — LIDOCAINE 2% (20 MG/ML) 5 ML SYRINGE
INTRAMUSCULAR | Status: AC
  Filled 2017-10-08: qty 5

## 2017-10-08 MED ORDER — MIDAZOLAM HCL 2 MG/2ML IJ SOLN: 0.5000 mg | Freq: Once | INTRAMUSCULAR | Status: DC | PRN

## 2017-10-08 MED ORDER — SCOPOLAMINE 1 MG/3DAYS TD PT72
MEDICATED_PATCH | TRANSDERMAL | Status: AC
  Administered 2017-10-08: 1.5 mg via TRANSDERMAL
  Filled 2017-10-08: qty 1

## 2017-10-08 MED ORDER — CEFAZOLIN SODIUM-DEXTROSE 2-4 GM/100ML-% IV SOLN
INTRAVENOUS | Status: AC
  Filled 2017-10-08: qty 100

## 2017-10-08 MED ORDER — ACETAMINOPHEN 325 MG PO TABS
650.0000 mg | ORAL_TABLET | ORAL | Status: DC | PRN
Start: 2017-10-08 — End: 2017-10-08

## 2017-10-08 MED ORDER — FENTANYL CITRATE (PF) 100 MCG/2ML IJ SOLN
25.0000 ug | INTRAMUSCULAR | Status: DC | PRN
  Administered 2017-10-08: 25 ug via INTRAVENOUS

## 2017-10-08 MED ORDER — FENTANYL CITRATE (PF) 250 MCG/5ML IJ SOLN
INTRAMUSCULAR | Status: AC
  Filled 2017-10-08: qty 5

## 2017-10-08 MED ORDER — MEPERIDINE HCL 50 MG/ML IJ SOLN: 6.2500 mg | INTRAMUSCULAR | Status: DC | PRN

## 2017-10-08 MED ORDER — HYDROMORPHONE HCL 1 MG/ML IJ SOLN
0.2500 mg | INTRAMUSCULAR | Status: DC | PRN
  Administered 2017-10-08: 0.5 mg via INTRAVENOUS

## 2017-10-08 MED ORDER — OXYCODONE HCL 5 MG PO TABS
ORAL_TABLET | ORAL | Status: AC
  Filled 2017-10-08: qty 1

## 2017-10-08 MED ORDER — EPHEDRINE SULFATE 50 MG/ML IJ SOLN
INTRAMUSCULAR | Status: AC
  Filled 2017-10-08: qty 2

## 2017-10-08 MED ORDER — POLYMYXIN B SULFATE 500000 UNITS IJ SOLR
INTRAMUSCULAR | Status: DC | PRN
  Administered 2017-10-08: 500 mL

## 2017-10-08 SURGICAL SUPPLY — 47 items
BAG DECANTER FOR FLEXI CONT (MISCELLANEOUS) ×3 IMPLANT
BINDER BREAST LRG (GAUZE/BANDAGES/DRESSINGS) IMPLANT
BINDER BREAST XLRG (GAUZE/BANDAGES/DRESSINGS) ×3 IMPLANT
BIOPATCH RED 1 DISK 7.0 (GAUZE/BANDAGES/DRESSINGS) ×2 IMPLANT
BIOPATCH RED 1IN DISK 7.0MM (GAUZE/BANDAGES/DRESSINGS) ×1
BLADE 10 SAFETY STRL DISP (BLADE) ×3 IMPLANT
CANISTER SUCT 3000ML PPV (MISCELLANEOUS) ×3 IMPLANT
CHLORAPREP W/TINT 26ML (MISCELLANEOUS) ×3 IMPLANT
COVER SURGICAL LIGHT HANDLE (MISCELLANEOUS) ×3 IMPLANT
DERMABOND ADVANCED (GAUZE/BANDAGES/DRESSINGS) ×2
DERMABOND ADVANCED .7 DNX12 (GAUZE/BANDAGES/DRESSINGS) ×1 IMPLANT
DRAIN CHANNEL 19F RND (DRAIN) ×3 IMPLANT
DRAPE HALF SHEET 40X57 (DRAPES) ×6 IMPLANT
DRAPE ORTHO SPLIT 77X108 STRL (DRAPES) ×4
DRAPE SURG 17X23 STRL (DRAPES) ×12 IMPLANT
DRAPE SURG ORHT 6 SPLT 77X108 (DRAPES) ×2 IMPLANT
DRAPE WARM FLUID 44X44 (DRAPE) ×3 IMPLANT
DRSG PAD ABDOMINAL 8X10 ST (GAUZE/BANDAGES/DRESSINGS) ×3 IMPLANT
ELECT BLADE 4.0 EZ CLEAN MEGAD (MISCELLANEOUS) ×3
ELECT REM PT RETURN 9FT ADLT (ELECTROSURGICAL) ×3
ELECTRODE BLDE 4.0 EZ CLN MEGD (MISCELLANEOUS) ×1 IMPLANT
ELECTRODE REM PT RTRN 9FT ADLT (ELECTROSURGICAL) ×1 IMPLANT
EVACUATOR SILICONE 100CC (DRAIN) ×3 IMPLANT
GAUZE SPONGE 4X4 12PLY STRL (GAUZE/BANDAGES/DRESSINGS) ×3 IMPLANT
GAUZE XEROFORM 1X8 LF (GAUZE/BANDAGES/DRESSINGS) ×3 IMPLANT
GLOVE BIO SURGEON STRL SZ 6.5 (GLOVE) ×2 IMPLANT
GLOVE BIO SURGEONS STRL SZ 6.5 (GLOVE) ×1
GOWN STRL REUS W/ TWL LRG LVL3 (GOWN DISPOSABLE) ×2 IMPLANT
GOWN STRL REUS W/TWL LRG LVL3 (GOWN DISPOSABLE) ×4
KIT BASIN OR (CUSTOM PROCEDURE TRAY) ×3 IMPLANT
KIT TURNOVER KIT B (KITS) ×3 IMPLANT
NS IRRIG 1000ML POUR BTL (IV SOLUTION) ×6 IMPLANT
PACK GENERAL/GYN (CUSTOM PROCEDURE TRAY) ×3 IMPLANT
PAD ARMBOARD 7.5X6 YLW CONV (MISCELLANEOUS) ×3 IMPLANT
PIN SAFETY STERILE (MISCELLANEOUS) ×3 IMPLANT
SET ASEPTIC TRANSFER (MISCELLANEOUS) IMPLANT
STAPLER VISISTAT 35W (STAPLE) IMPLANT
SUT MON AB 5-0 PS2 18 (SUTURE) ×6 IMPLANT
SUT PDS AB 2-0 CT1 27 (SUTURE) ×12 IMPLANT
SUT PDS AB 3-0 SH 27 (SUTURE) IMPLANT
SUT SILK 3 0 SH 30 (SUTURE) ×3 IMPLANT
SUT VIC AB 3-0 SH 27 (SUTURE) ×6
SUT VIC AB 3-0 SH 27X BRD (SUTURE) ×3 IMPLANT
SUT VICRYL 4-0 PS2 18IN ABS (SUTURE) ×6 IMPLANT
TOWEL OR 17X24 6PK STRL BLUE (TOWEL DISPOSABLE) ×3 IMPLANT
TOWEL OR 17X26 10 PK STRL BLUE (TOWEL DISPOSABLE) ×3 IMPLANT
TRAY FOLEY MTR SLVR 14FR STAT (SET/KITS/TRAYS/PACK) IMPLANT

## 2017-10-08 NOTE — Anesthesia Preprocedure Evaluation (Addendum)
Anesthesia Evaluation  Patient identified by MRN, date of birth, ID band Patient awake    Reviewed: Allergy & Precautions, NPO status , Patient's Chart, lab work & pertinent test results, reviewed documented beta blocker date and time   History of Anesthesia Complications Negative for: history of anesthetic complications  Airway Mallampati: II  TM Distance: >3 FB Neck ROM: Full    Dental  (+) Dental Advisory Given   Pulmonary Current Smoker,    breath sounds clear to auscultation       Cardiovascular hypertension, Pt. on medications and Pt. on home beta blockers (-) angina+ CAD, + Past MI ('17 NSTEMI) and + Peripheral Vascular Disease (renal artery stenosis)   Rhythm:Regular Rate:Normal  '18 Stress test: Intermediate risk stress nuclear study with prior apical infarct and very mild peri-infarct ischemia; EF 43 with apical akinesis; study intermediate risk due to reduced LV function  '17 ECHO: EF 55%, mod MR, mild TR   Neuro/Psych  Headaches,    GI/Hepatic Neg liver ROS, GERD  Controlled,  Endo/Other  negative endocrine ROS  Renal/GU negative Renal ROS     Musculoskeletal   Abdominal   Peds  Hematology negative hematology ROS (+)   Anesthesia Other Findings Breast cancer  Reproductive/Obstetrics BTL                            Anesthesia Physical Anesthesia Plan  ASA: III  Anesthesia Plan: General   Post-op Pain Management:    Induction: Intravenous  PONV Risk Score and Plan: 3 and Dexamethasone, Scopolamine patch - Pre-op and Treatment may vary due to age or medical condition  Airway Management Planned: LMA  Additional Equipment:   Intra-op Plan:   Post-operative Plan:   Informed Consent: I have reviewed the patients History and Physical, chart, labs and discussed the procedure including the risks, benefits and alternatives for the proposed anesthesia with the patient or  authorized representative who has indicated his/her understanding and acceptance.   Dental advisory given  Plan Discussed with: CRNA and Surgeon  Anesthesia Plan Comments: (Plan routine monitors, GA -LMA OK)        Anesthesia Quick Evaluation

## 2017-10-08 NOTE — Anesthesia Procedure Notes (Signed)
Procedure Name: LMA Insertion Date/Time: 10/08/2017 3:35 PM Performed by: Julieta Bellini, CRNA Pre-anesthesia Checklist: Patient identified, Emergency Drugs available, Suction available and Patient being monitored Patient Re-evaluated:Patient Re-evaluated prior to induction Oxygen Delivery Method: Circle system utilized Preoxygenation: Pre-oxygenation with 100% oxygen Induction Type: IV induction Ventilation: Mask ventilation without difficulty LMA: LMA inserted LMA Size: 4.0 Number of attempts: 1 Placement Confirmation: positive ETCO2 and breath sounds checked- equal and bilateral Tube secured with: Tape Dental Injury: Teeth and Oropharynx as per pre-operative assessment

## 2017-10-08 NOTE — Anesthesia Postprocedure Evaluation (Signed)
Anesthesia Post Note  Patient: [name]  Procedure(s) Performed: REMOVAL OF RIGHT BREAST TISSUE EXPANDER (Right Breast)     Patient location during evaluation: PACU Anesthesia Type: General Level of consciousness: awake and alert, oriented and patient cooperative Pain management: pain level controlled Vital Signs Assessment: post-procedure vital signs reviewed and stable Respiratory status: spontaneous breathing, nonlabored ventilation and respiratory function stable Cardiovascular status: blood pressure returned to baseline and stable Postop Assessment: no apparent nausea or vomiting Anesthetic complications: no    Last Vitals:  Vitals:   10/08/17 1714 10/08/17 1721  BP: 116/73 128/71  Pulse: 85 93  Resp: 20 14  Temp: 36.7 C   SpO2: 93% 92%    Last Pain:  Vitals:   10/08/17 1721  TempSrc:   PainSc: 3                  Dennard Vezina,E. Keiland Pickering

## 2017-10-08 NOTE — Op Note (Signed)
DATE OF OPERATION: 10/08/2017  LOCATION: [facility]  PREOPERATIVE DIAGNOSIS: Exposed right breast expander  POSTOPERATIVE DIAGNOSIS: Same  PROCEDURE: Removal of right breast expander and excision of skin and FlexHD 5 x 8 cm.  SURGEON: Zanyia Silbaugh [place] Aedon Deason, DO  EBL: 5 cc  CONDITION: Stable  COMPLICATIONS: None  INDICATION: The patient, [name], is a 47 y.o. female born on 1970-12-03, is here for treatment of a right breast wound with exposed expander after radiation treatment.   PROCEDURE DETAILS:  The patient was seen prior to surgery and marked.  The IV antibiotics were given. The patient was taken to the operating room and given a general anesthetic. A standard time out was performed and all information was confirmed by those in the room. SCDs were placed.   The skin was prepped and draped with betadine.  The expander was removed and the 5 x 8 cm wound excised.  The #10 blade was used and the nonviable skin and flexHD was excised for a 5 x 8 cm area.  The wound was cultured.  The area was irrigated with antibiotic solution and saline.  The flaps was release inferiorly and the superior capsule was excised.  The drain was placed and secured to the chest skin with 3-0 Silk.  The deep layers were closed with the 3-0 and 4-0 Monocryl.  Bactroban was applied with sterile dressing and breast binder.  The patient was allowed to wake up and taken to recovery room in stable condition at the end of the case. The family was notified at the end of the case.

## 2017-10-08 NOTE — Discharge Instructions (Signed)
Drain care. Continue binder. Can shower starting Friday.

## 2017-10-08 NOTE — H&P (Signed)
[name] is an 47 y.o. female.   Chief Complaint: exposed right breast expander HPI: The patient is a 47 yrs old bf here for treatment of an exposed right breast expander.  She underwent a right mastectomy with expander placement for breast cancer 11/18.  She then underwent radiation.  She had severe skin response and noted an opening in her skin over the past day or two.  She states not feeling well.  There is 4 x 8 cm of the expander exposed.  Past Medical History:  Diagnosis Date  . Asthma    beginning of 2018 - came to ER    . Bell's palsy   . Breast cancer ([place]) 01/2017  . Cardiomegaly   . Coronary artery disease    NSTEMI 08/2015 (65% LAD; D1 50%)  . Family history of breast cancer   . Family history of prostate cancer   . 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 GERD (gastroesophageal reflux disease)   . Hypertension   . 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 Myocardial infarction ([place]) 2017  . Renal artery stenosis ([facility])    s/p stent placment to left     Past Surgical History:  Procedure Laterality Date  . AXILLARY LYMPH NODE DISSECTION Right 03/13/2017   Procedure: AXILLARY LYMPH NODE DISSECTION;  Surgeon: Erroll Luna, MD;  Location: [facility];  Service: General;  Laterality: Right;  . BREAST RECONSTRUCTION WITH PLACEMENT OF TISSUE EXPANDER AND FLEX HD (ACELLULAR HYDRATED DERMIS) Right 01/30/2017   Procedure: RIGHT BREAST RECONSTRUCTION WITH PLACEMENT OF TISSUE EXPANDER AND FLEX HD (ACELLULAR HYDRATED DERMIS);  Surgeon: Wallace Going, DO;  Location: [facility];  Service: Plastics;  Laterality: Right;  . BREAST SURGERY     breast bx  07/2016  . CARDIAC CATHETERIZATION N/A 09/18/2015   Procedure: Left Heart Cath and Coronary Angiography;  Surgeon: Leonie Man, MD;  Location: [place] [facility];  Service: Cardiovascular;  Laterality: N/A;  . [place]   x2  . IR CV LINE INJECTION  07/24/2016  . MASTECTOMY WITH RADIOACTIVE SEED GUIDED  EXCISION AND AXILLARY SENTINEL LYMPH NODE BIOPSY Right 01/30/2017   Procedure: RIGHT SIMPLE MASTECTOMY WITH  RADIOACTIVE SEED TARGETED RIGHT AXILLARY LYMPH NODE EXCISION AND RIGHT AXILLARY SENTINEL LYMPH NODE BIOPSY;  Surgeon: Erroll Luna, MD;  Location: [facility];  Service: General;  Laterality: Right;  . PERIPHERAL VASCULAR CATHETERIZATION N/A 08/23/2014   Procedure: Renal Angiography;  Surgeon: Adrian Prows, MD;  Location: [facility];  Service: Cardiovascular;  Laterality: N/A;  . RENAL ARTERY STENT Left 08/23/2014  . SIMPLE MASTECTOMY Right 01/30/2017  . SKIN BIOPSY  2000  . TUBAL LIGATION      Family History  Problem Relation Age of Onset  . Hypertension Mother   . Cervical cancer Mother        hysterectomy with ?metastatic disease  . Coronary artery disease Father 40       CABG  . Asthma Father   . Breast cancer Maternal Aunt        age at diagnosis unknown  . Prostate cancer Cousin        paternal first cousin  . Migraines Neg Hx    Social History:  reports that she has been smoking cigarettes.  She has a 7.50 pack-year smoking history. She has never used smokeless tobacco. She reports that she does not drink alcohol or use  drugs.  Allergies:  Allergies  Allergen Reactions  . Compazine [Prochlorperazine] Anaphylaxis, Swelling and Other (See Comments)    Throat swelling  . Ondansetron Hcl Anaphylaxis, Swelling and Other (See Comments)    Throat swelling per patient    Medications Prior to Admission  Medication Sig Dispense Refill  . acetaminophen (TYLENOL) 500 MG tablet Take 1,000 mg by mouth every 8 (eight) hours as needed for mild pain or headache.    . albuterol (PROVENTIL) (2.5 MG/3ML) 0.083% nebulizer solution Take 3 mLs (2.5 mg total) by nebulization every 6 (six) hours as needed for wheezing or shortness of breath. 75 mL 12  . amLODipine (NORVASC) 10 MG tablet Take 10 mg by mouth daily.    . aspirin 81 MG chewable tablet Chew 81 mg by mouth daily.    .  atorvastatin (LIPITOR) 80 MG tablet Take 1 tablet (80 mg total) by mouth daily at 6 PM. 30 tablet 0  . cetirizine (ZYRTEC) 10 MG tablet Take 10 mg by mouth every evening.  11  . CVS D3 5000 units capsule Take 5,000 Units by mouth daily.  11  . ipratropium (ATROVENT) 0.03 % nasal spray Place 2 sprays into both nostrils 2 (two) times daily as needed for rhinitis. unk  6  . isosorbide mononitrate (IMDUR) 60 MG 24 hr tablet Take 1 tablet (60 mg total) by mouth daily. 90 tablet 3  . lidocaine-prilocaine (EMLA) cream Apply 1 application topically daily as needed. Apply to affected area  3  . losartan (COZAAR) 50 MG tablet Take 50 mg by mouth daily.  3  . metoprolol succinate (TOPROL XL) 100 MG 24 hr tablet Take 1 tablet (100 mg total) by mouth daily. Take with or immediately following a meal. 30 tablet 0  . mometasone-formoterol (DULERA) 200-5 MCG/ACT AERO Inhale 2 puffs into the lungs 2 (two) times daily. 1 Inhaler 1  . nitroGLYCERIN (NITROSTAT) 0.4 MG SL tablet Place 0.4 mg under the tongue every 5 (five) minutes as needed for chest pain.    . oxyCODONE-acetaminophen (PERCOCET/ROXICET) 5-325 MG tablet Take 1-2 tablets by mouth every 6 (six) hours as needed for severe pain. 20 tablet 0  . PROAIR HFA 108 (90 Base) MCG/ACT inhaler Inhale 2 puffs into the lungs every 6 (six) hours as needed for wheezing or shortness of breath.   3  . sulfamethoxazole-trimethoprim (BACTRIM DS,SEPTRA DS) 800-160 MG tablet Take 2 tablets by mouth 2 (two) times daily for 10 days. 40 tablet 0  . venlafaxine XR (EFFEXOR-XR) 37.5 MG 24 hr capsule Take 1 capsule (37.5 mg total) by mouth daily with breakfast. 30 capsule 6    Results for orders placed or performed during the hospital encounter of 10/07/17 (from the past 48 hour(s))  Basic metabolic panel     Status: Abnormal   Collection Time: 10/07/17  8:34 AM  Result Value Ref Range   Sodium 141 135 - 145 mmol/L   Potassium 3.8 3.5 - 5.1 mmol/L    Comment: SLIGHT HEMOLYSIS    Chloride 109 98 - 111 mmol/L    Comment: Please note change in reference range.   CO2 20 (L) 22 - 32 mmol/L   Glucose, Bld 98 70 - 99 mg/dL    Comment: Please note change in reference range.   BUN 7 6 - 20 mg/dL    Comment: Please note change in reference range.   Creatinine, Ser 0.64 0.44 - 1.00 mg/dL   Calcium 9.6 8.9 - 10.3 mg/dL   GFR   calc non Af Amer >60 >60 mL/min   GFR calc Af Amer >60 >60 mL/min    Comment: (NOTE) The eGFR has been calculated using the CKD EPI equation. This calculation has not been validated in all clinical situations. eGFR's persistently <60 mL/min signify possible Chronic Kidney Disease.    Anion gap 12 5 - 15    Comment: Performed at [facility], [address] [place], [place] [address]  CBC     Status: Abnormal   Collection Time: 10/07/17  8:34 AM  Result Value Ref Range   WBC 12.2 (H) 4.0 - 10.5 K/uL   RBC 4.53 3.87 - 5.11 MIL/uL   Hemoglobin 13.7 12.0 - 15.0 g/dL   HCT 41.8 36.0 - 46.0 %   MCV 92.3 78.0 - 100.0 fL   MCH 30.2 26.0 - 34.0 pg   MCHC 32.8 30.0 - 36.0 g/dL   RDW 14.3 11.5 - 15.5 %   Platelets 267 150 - 400 K/uL    Comment: Performed at [facility], [address] [place], [place] [address]  I-stat troponin, ED     Status: None   Collection Time: 10/07/17  8:41 AM  Result Value Ref Range   Troponin i, poc 0.00 0.00 - 0.08 ng/mL   Comment 3            Comment: Due to the release kinetics of cTnI, a negative result within the first hours of the onset of symptoms does not rule out myocardial infarction with certainty. If myocardial infarction is still suspected, repeat the test at appropriate intervals.    Dg Chest 2 View  Result Date: 10/07/2017 CLINICAL DATA:  Chest pain.  History of breast carcinoma EXAM: CHEST - 2 VIEW COMPARISON:  April 12, 2016 chest radiograph and chest CT April 10, 2017 FINDINGS: There is no edema or consolidation. The heart size and pulmonary vascularity are normal. No adenopathy.  Port-A-Cath tip is in the superior vena cava. No pneumothorax. Breast tissue expander noted on the right. No bone lesions. IMPRESSION: No edema or consolidation.  No evident adenopathy. Electronically Signed   By: William  Woodruff III M.D.   On: 10/07/2017 08:12   Ct Chest W Contrast  Result Date: 10/07/2017 CLINICAL DATA:  Chest pain, recent mastectomy and draining open wound, history of breast carcinoma with completion of chemotherapy in May of 2018 EXAM: CT CHEST WITH CONTRAST TECHNIQUE: Multidetector CT imaging of the chest was performed during intravenous contrast administration. CONTRAST:  100mL OMNIPAQUE IOHEXOL 300 MG/ML  SOLN COMPARISON:  Chest x-ray of 10/07/2017, and CT chest of 04/10/2017 FINDINGS: Cardiovascular: The heart is within upper limits of normal. No definite pericardial effusion is seen. However there are diffuse coronary artery calcifications for age. The mid ascending thoracic aorta measures 29 mm in diameter. There is some opacification of pulmonary arteries as well. No central embolus is seen. Mediastinum/Nodes: No mediastinal or hilar adenopathy is noted. The thyroid gland is unremarkable. There does appear to be a small hiatal hernia present. Lungs/Pleura: On lung window images there are diffuse changes of primarily centrilobular emphysema. A single 5 mm noncalcified nodule is noted anteriorly within the right upper lobe. This nodule is not seen in comparison with the prior CT from earlier this year. No other lung nodule is seen. No parenchymal infiltrate is noted and there is no evidence of pleural effusion. Bibasilar dependent atelectasis is present. Upper Abdomen: A right breast implant is present and there is considerable surrounding soft tissue   which may indicate cellulitis. This surrounding soft tissue was not present the prior CT and may well be inflammatory in nature. No discrete abscess is seen. There is an indentation lateral to the expected position of the nipple,  probably due to the history of open wound. Also, there is a left axillary lymph node on image 37 series 3 measuring 11 mm in diameter, unchanged compared to the prior CT. The upper abdomen is unremarkable. Musculoskeletal: No acute bony abnormality is seen. IMPRESSION: 1. There is considerably more soft tissue surrounding the right breast implant possibly inflammatory in nature. No discrete abscess is seen. The implant may be partially ruptured compared to the prior images. 2. New 5 mm noncalcified nodule in the anterior right upper lobe not present on the most recent CT of 04/10/2017. Consider close follow-up in view of the patient's history. 3. Age advanced coronary artery calcifications. 4. Centrilobular emphysema. Electronically Signed   By: Paul  Barry M.D.   On: 10/07/2017 13:37    Review of Systems  Constitutional: Positive for chills and fever.  HENT: Negative.   Eyes: Negative.   Respiratory: Negative.   Cardiovascular: Negative.   Gastrointestinal: Negative.   Genitourinary: Negative.   Musculoskeletal: Negative.   Skin: Negative.   Psychiatric/Behavioral: Negative.     Blood pressure 132/66, pulse 100, temperature 98 F (36.7 C), temperature source Oral, resp. rate 20, height 5' 3" (1.6 m), weight 74.8 kg (165 lb), SpO2 95 %. Physical Exam  Constitutional: She is oriented to person, place, and time. She appears well-developed and well-nourished.  HENT:  Head: Normocephalic and atraumatic.  Eyes: Pupils are equal, round, and reactive to light. EOM are normal.  Respiratory: Effort normal. No respiratory distress.    Neurological: She is alert and oriented to person, place, and time.  Skin: There is erythema.  Psychiatric: She has a normal mood and affect. Her behavior is normal. Judgment and thought content normal.     Assessment/Plan Plan for removal of right breast expander.  Claire S Dillingham, DO 10/08/2017, 2:53 PM   

## 2017-10-08 NOTE — Transfer of Care (Signed)
Immediate Anesthesia Transfer of Care Note  Patient: [name]  Procedure(s) Performed: REMOVAL OF RIGHT BREAST TISSUE EXPANDER (Right Breast)  Patient Location: PACU  Anesthesia Type:General  Level of Consciousness: drowsy and patient cooperative  Airway & Oxygen Therapy: Patient Spontanous Breathing and Patient connected to nasal cannula oxygen  Post-op Assessment: Report given to RN, Post -op Vital signs reviewed and stable and Patient moving all extremities X 4  Post vital signs: Reviewed and stable  Last Vitals:  Vitals Value Taken Time  BP 141/91 10/08/2017  4:28 PM  Temp 36.7 C 10/08/2017  4:28 PM  Pulse 95 10/08/2017  4:29 PM  Resp 24 10/08/2017  4:29 PM  SpO2 90 % 10/08/2017  4:29 PM  Vitals shown include unvalidated device data.  Last Pain:  Vitals:   10/08/17 1442  TempSrc:   PainSc: 0-No pain      Patients Stated Pain Goal: 3 (88/89/16 9450)  Complications: No apparent anesthesia complications

## 2017-10-09 ENCOUNTER — Encounter (HOSPITAL_COMMUNITY): Payer: Self-pay | Admitting: Plastic Surgery

## 2017-10-13 LAB — AEROBIC/ANAEROBIC CULTURE W GRAM STAIN (SURGICAL/DEEP WOUND)

## 2017-10-13 LAB — AEROBIC/ANAEROBIC CULTURE (SURGICAL/DEEP WOUND)

## 2017-10-19 ENCOUNTER — Other Ambulatory Visit: Payer: Self-pay | Admitting: Hematology and Oncology

## 2017-11-03 ENCOUNTER — Inpatient Hospital Stay: Payer: Medicaid Other | Admitting: Adult Health

## 2017-11-13 ENCOUNTER — Encounter: Payer: Medicaid Other | Admitting: Adult Health

## 2018-03-07 ENCOUNTER — Other Ambulatory Visit: Payer: Self-pay

## 2018-03-09 MED ORDER — ISOSORBIDE MONONITRATE ER 60 MG PO TB24: 60.0000 mg | ORAL_TABLET | Freq: Every day | ORAL | 3 refills | Status: DC

## 2018-04-10 ENCOUNTER — Other Ambulatory Visit: Payer: Self-pay

## 2018-04-10 ENCOUNTER — Encounter: Payer: Self-pay | Admitting: Hematology and Oncology

## 2018-04-10 ENCOUNTER — Telehealth: Payer: Self-pay

## 2018-04-10 NOTE — Telephone Encounter (Signed)
Pt called to find out when she can start on her anti estrogen medication. Pt does not remember going over her medication with Dr.Gudena. She had completed radiation since April 2019. Told pt that Dr.Gudena would like to start her on tamoxifen and possibly see if she is eligible for natalee trial, according to his last note. Pt remembers about the trial but does not have any further information about it. Told pt that she needs to come in for an appt to discuss anti estrogen, now that she is now in menopause and about the trial. Pt agreeable to plan. Confirmed time/date of new appt next week. No further needs at this time.

## 2018-04-13 ENCOUNTER — Telehealth: Payer: Self-pay | Admitting: Hematology and Oncology

## 2018-04-13 ENCOUNTER — Inpatient Hospital Stay: Payer: Medicaid Other | Attending: Hematology and Oncology | Admitting: Hematology and Oncology

## 2018-04-13 DIAGNOSIS — Z170 Estrogen receptor positive status [ER+]: Secondary | ICD-10-CM | POA: Diagnosis not present

## 2018-04-13 DIAGNOSIS — Z9221 Personal history of antineoplastic chemotherapy: Secondary | ICD-10-CM | POA: Insufficient documentation

## 2018-04-13 DIAGNOSIS — Z79811 Long term (current) use of aromatase inhibitors: Secondary | ICD-10-CM | POA: Diagnosis not present

## 2018-04-13 DIAGNOSIS — C50811 Malignant neoplasm of overlapping sites of right female breast: Secondary | ICD-10-CM | POA: Diagnosis not present

## 2018-04-13 DIAGNOSIS — N951 Menopausal and female climacteric states: Secondary | ICD-10-CM | POA: Insufficient documentation

## 2018-04-13 DIAGNOSIS — Z7982 Long term (current) use of aspirin: Secondary | ICD-10-CM | POA: Diagnosis not present

## 2018-04-13 DIAGNOSIS — Z79899 Other long term (current) drug therapy: Secondary | ICD-10-CM | POA: Insufficient documentation

## 2018-04-13 DIAGNOSIS — Z923 Personal history of irradiation: Secondary | ICD-10-CM

## 2018-04-13 MED ORDER — VENLAFAXINE HCL ER 75 MG PO CP24: 75.0000 mg | ORAL_CAPSULE | Freq: Every day | ORAL | 11 refills | Status: DC

## 2018-04-13 MED ORDER — ANASTROZOLE 1 MG PO TABS: 1.0000 mg | ORAL_TABLET | Freq: Every day | ORAL | 3 refills | Status: DC

## 2018-04-13 NOTE — Progress Notes (Signed)
Patient Care Team: Javier Docker, MD as PCP - General (Internal Medicine) Troy Sine, MD as PCP - Cardiology (Cardiology) Erroll Luna, MD as Consulting Physician (General Surgery) Nicholas Lose, MD as Consulting Physician (Hematology and Oncology) Kyung Rudd, MD as Consulting Physician (Radiation Oncology) Delice Bison Charlestine Massed, NP as Nurse Practitioner (Hematology and Oncology)  DIAGNOSIS:  Encounter Diagnosis  Name Primary?  . Malignant neoplasm of overlapping sites of right breast in female, estrogen receptor positive ([place])     SUMMARY OF ONCOLOGIC HISTORY:   Malignant neoplasm of overlapping sites of right breast in female, estrogen receptor positive ([place])   07/18/2016 Initial Diagnosis    Palpable right breast masses for 1 year; 2 adjacent spiculated masses by ultrasound at 1:00 subareolar 4.2 cm mass; 3 satellite nodules at 9:00; 2 abnormal lymph nodes; biopsy of the 2 masses and the lymph node showed similar-appearing grade 3 IDC ER 100% PR 95% Ki-67 20-30%, HER-2 negative ratio 1.57; T2 N1 stage IIB (AJCC 8)    08/09/2016 - 10/11/2016 Neo-Adjuvant Chemotherapy    Taxotere and Cytoxan every 3 weeks 4 cycles    08/22/2016 Genetic Testing    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  The Hereditary Gene Panel offered by [facility] includes sequencing and/or deletion duplication testing of the following 46 genes: APC, ATM, AXIN2, BARD1, BMPR1A, BRCA1, BRCA2, BRIP1, CDH1, CDKN2A (p14ARF), CDKN2A (p16INK4a), CHEK2, CTNNA1, DICER1, EPCAM (Deletion/duplication testing only), GREM1 (promoter region deletion/duplication testing only), KIT, MEN1, MLH1, MSH2, MSH3, MSH6, MUTYH, NBN, NF1, NHTL1, PALB2, PDGFRA, PMS2, POLD1, POLE, PTEN, RAD50, RAD51C, RAD51D, SDHB, SDHC, SDHD, SMAD4, SMARCA4. STK11, TP53, TSC1, TSC2, and VHL.  The following genes were evaluated for sequence changes only: SDHA and HOXB13 c.251G>A variant only.  The report date is Aug 22, 2016.     01/30/2017 Surgery    Right mastectomy: IDC grade 3, 7.2 cm, high-grade DCIS, lymphovascular invasion present including dermal lymphatics, perineural invasion present, margins negative, 4/5 lymph nodes positive with extracapsular extension, ER 100%, PR 100%, HER-2 negative ratio 1.41    05/29/2017 - 07/18/2017 Radiation Therapy    Adjuvant radiation therapy    04/13/2018 -  Anti-estrogen oral therapy    Initially prescribed tamoxifen but apparently she did not pick it up.  Because she is menopausal we are starting anastrozole 1 mg daily starting 04/13/2018     CHIEF COMPLIANT: Follow-up to discuss antiestrogen treatment plan  INTERVAL HISTORY: [name] is a 25-year with above-mentioned history of breast cancer who underwent neoadjuvant chemotherapy followed by mastectomy and radiation and is here to discuss starting antiestrogen therapy.  I had prescribed her tamoxifen but apparently she did not get the medicine from her pharmacy.  She did not call as that she was not taking it.  She came in today to discuss antiestrogen treatment plan.  She reports profound hot flashes that are not responding to 37.5 mg of Effexor.  She would like to increase that dosage.  Denies any lumps or nodules in the breast or chest wall.  REVIEW OF SYSTEMS:   Constitutional: Denies fevers, chills or abnormal weight loss Eyes: Denies blurriness of vision Ears, nose, mouth, throat, and face: Denies mucositis or sore throat Respiratory: Denies cough, dyspnea or wheezes Cardiovascular: Denies palpitation, chest discomfort Gastrointestinal:  Denies nausea, heartburn or change in bowel habits Skin: Denies abnormal skin rashes Lymphatics: Denies new lymphadenopathy or easy bruising Neurological:Denies numbness, tingling or new weaknesses Behavioral/Psych: Mood is stable, no new changes  Extremities: No lower extremity edema All other systems were reviewed with the patient and are negative.  I have reviewed  the past medical history, past surgical history, social history and family history with the patient and they are unchanged from previous note.  ALLERGIES:  is allergic to compazine [prochlorperazine] and ondansetron hcl.  MEDICATIONS:  Current Outpatient Medications  Medication Sig Dispense Refill  . acetaminophen (TYLENOL) 500 MG tablet Take 1,000 mg by mouth every 8 (eight) hours as needed for mild pain or headache.    . albuterol (PROVENTIL) (2.5 MG/3ML) 0.083% nebulizer solution Take 3 mLs (2.5 mg total) by nebulization every 6 (six) hours as needed for wheezing or shortness of breath. 75 mL 12  . amLODipine (NORVASC) 10 MG tablet Take 10 mg by mouth daily.    Marland Kitchen anastrozole (ARIMIDEX) 1 MG tablet Take 1 tablet (1 mg total) by mouth daily. 90 tablet 3  . aspirin 81 MG chewable tablet Chew 81 mg by mouth daily.    Marland Kitchen atorvastatin (LIPITOR) 80 MG tablet Take 1 tablet (80 mg total) by mouth daily at 6 PM. 30 tablet 0  . cetirizine (ZYRTEC) 10 MG tablet Take 10 mg by mouth every evening.  11  . CVS D3 5000 units capsule Take 5,000 Units by mouth daily.  11  . ipratropium (ATROVENT) 0.03 % nasal spray Place 2 sprays into both nostrils 2 (two) times daily as needed for rhinitis. unk  6  . isosorbide mononitrate (IMDUR) 60 MG 24 hr tablet Take 1 tablet (60 mg total) by mouth daily. 90 tablet 3  . losartan (COZAAR) 50 MG tablet Take 50 mg by mouth daily.  3  . metoprolol succinate (TOPROL XL) 100 MG 24 hr tablet Take 1 tablet (100 mg total) by mouth daily. Take with or immediately following a meal. 30 tablet 0  . mometasone-formoterol (DULERA) 200-5 MCG/ACT AERO Inhale 2 puffs into the lungs 2 (two) times daily. 1 Inhaler 1  . nitroGLYCERIN (NITROSTAT) 0.4 MG SL tablet Place 0.4 mg under the tongue every 5 (five) minutes as needed for chest pain.    Marland Kitchen PROAIR HFA 108 (90 Base) MCG/ACT inhaler Inhale 2 puffs into the lungs every 6 (six) hours as needed for wheezing or shortness of breath.   3  .  venlafaxine XR (EFFEXOR-XR) 75 MG 24 hr capsule Take 1 capsule (75 mg total) by mouth daily with breakfast. 30 capsule 11   No current facility-administered medications for this visit.     PHYSICAL EXAMINATION: ECOG PERFORMANCE STATUS: 0 - Asymptomatic  Vitals:   04/13/18 0917  BP: 135/80  Pulse: 79  Resp: 17  Temp: 98.4 F (36.9 C)  SpO2: 99%   Filed Weights   04/13/18 0917  Weight: 171 lb 4.8 oz (77.7 kg)    GENERAL:alert, no distress and comfortable SKIN: skin color, texture, turgor are normal, no rashes or significant lesions EYES: normal, Conjunctiva are pink and non-injected, sclera clear OROPHARYNX:no exudate, no erythema and lips, buccal mucosa, and tongue normal  NECK: supple, thyroid normal size, non-tender, without nodularity LYMPH:  no palpable lymphadenopathy in the cervical, axillary or inguinal LUNGS: clear to auscultation and percussion with normal breathing effort HEART: regular rate & rhythm and no murmurs and no lower extremity edema ABDOMEN:abdomen soft, non-tender and normal bowel sounds MUSCULOSKELETAL:no cyanosis of digits and no clubbing  NEURO: alert & oriented x 3 with fluent speech, no focal motor/sensory deficits EXTREMITIES: No lower extremity edema  LABORATORY DATA:  I have reviewed  the data as listed CMP Latest Ref Rng & Units 10/07/2017 03/13/2017 01/23/2017  Glucose 70 - 99 mg/dL 98 91 97  BUN 6 - 20 mg/dL 7 7 5(L)  Creatinine 0.44 - 1.00 mg/dL 0.64 0.68 0.71  Sodium 135 - 145 mmol/L 141 140 140  Potassium 3.5 - 5.1 mmol/L 3.8 3.9 3.4(L)  Chloride 98 - 111 mmol/L 109 111 107  CO2 22 - 32 mmol/L 20(L) 20(L) 23  Calcium 8.9 - 10.3 mg/dL 9.6 9.4 9.4  Total Protein 6.5 - 8.1 g/dL - - -  Total Bilirubin 0.3 - 1.2 mg/dL - - -  Alkaline Phos 38 - 126 U/L - - -  AST 15 - 41 U/L - - -  ALT 14 - 54 U/L - - -    Lab Results  Component Value Date   WBC 12.2 (H) 10/07/2017   HGB 13.7 10/07/2017   HCT 41.8 10/07/2017   MCV 92.3 10/07/2017    PLT 267 10/07/2017   NEUTROABS 5.1 12/19/2016    ASSESSMENT & PLAN:  Malignant neoplasm of overlapping sites of right breast in female, estrogen receptor positive ([place]) 07/18/2016: Palpable right breast masses for 1 year; 2 adjacent spiculated masses by ultrasound at 1:00 subareolar 4.2 cm mass; 3 satellite nodules at 9:00; 2 abnormal lymph nodes; biopsy of the 2 masses and the lymph node showed similar-appearing 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  Treatment summary: 1. Neoadjuvant chemotherapy with Taxotereand Cytoxan every 3 weeks4completed July 2018 2.01/30/2017:Right mastectomy: IDC grade 3, 7.2 cm, high-grade DCIS, lymphovascular invasion present including dermal lymphatics, perineural invasion present, margins negative, 4/5 lymph nodes positive with extracapsular extension, ER 100%, PR 100%, HER-2 negative ratio 1.41 3.Adjuvant radiation therapy 05/29/2017-07/18/2017 5.Followed by antiestrogen therapy with tamoxifen --------------------------------------------------------------------- Current treatment: Anastrozole 1 mg daily to start 04/13/2018, patient had not had menstrual cycle since 2010  Anastrozole counseling: We discussed the risks and benefits of anti-estrogen therapy with aromatase inhibitors. These include but not limited to insomnia, hot flashes, mood changes, vaginal dryness, bone density loss, and weight gain. We strongly believe that the benefits far outweigh the risks. Patient understands these risks and consented to starting treatment. Planned treatment duration is 7 years.  Hot flashes: I will increase the dosage of Effexor to 75 mg daily.  Breast cancer surveillance: 1.  Breast exam 04/13/2018: Benign 2.  Mammogram: Left breast needs to be scheduled.  I sent a new prescription.  Return to clinic in 1  year for follow-up    Orders Placed This Encounter  Procedures  . MM DIAG BREAST TOMO BILATERAL    Standing Status:   Future    Standing Expiration Date:   04/14/2019    Order Specific Question:   Reason for Exam (SYMPTOM  OR DIAGNOSIS REQUIRED)    Answer:   Annual mammograms for breast cancer    Order Specific Question:   Is the patient pregnant?    Answer:   No    Order Specific Question:   Preferred imaging location?    Answer:   [facility]   The patient has a good understanding of the overall plan. she agrees with it. she will call with any problems that may develop before the next visit here.   Harriette Ohara, MD 04/13/18

## 2018-04-13 NOTE — Assessment & Plan Note (Signed)
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  Treatment summary: 1. Neoadjuvant chemotherapy with Taxotereand Cytoxan every 3 weeks4completed July 2018 2.01/30/2017:Right mastectomy: IDC grade 3, 7.2 cm, high-grade DCIS, lymphovascular invasion present including dermal lymphatics, perineural invasion present, margins negative, 4/5 lymph nodes positive with extracapsular extension, ER 100%, PR 100%, HER-2 negative ratio 1.41 3.Adjuvant radiation therapy 05/29/2017-07/18/2017 5.Followed by antiestrogen therapy with tamoxifen --------------------------------------------------------------------- Current treatment: Tamoxifen 20 mg daily Tamoxifen toxicities:  Breast cancer surveillance: 1.  Breast exam 04/13/2018: Benign 2.  Mammogram: Left breast needs to be scheduled  Return to clinic in 1 year for follow-up

## 2018-04-13 NOTE — Telephone Encounter (Signed)
Gave avs and calendar ° °

## 2018-04-23 ENCOUNTER — Other Ambulatory Visit: Payer: Self-pay | Admitting: Hematology and Oncology

## 2018-04-23 DIAGNOSIS — Z1231 Encounter for screening mammogram for malignant neoplasm of breast: Secondary | ICD-10-CM

## 2018-05-12 ENCOUNTER — Ambulatory Visit
Admission: RE | Admit: 2018-05-12 | Discharge: 2018-05-12 | Disposition: A | Discharge status: Home / Self Care | Payer: Medicaid Other | Source: Ambulatory Visit | Attending: Hematology and Oncology | Admitting: Hematology and Oncology

## 2018-05-12 DIAGNOSIS — Z1231 Encounter for screening mammogram for malignant neoplasm of breast: Secondary | ICD-10-CM

## 2018-09-02 IMAGING — MG 2D DIGITAL DIAGNOSTIC BILATERAL MAMMOGRAM WITH CAD AND ADJUNCT T
8 of 15 series · 8 of 35 positions shown · non-contrast
Comparison: None available.

CLINICAL DATA: Right subareolar breast palpable mass felt by the
patient approximately 1 year ago.

EXAM:
2D DIGITAL DIAGNOSTIC BILATERAL MAMMOGRAM WITH CAD AND ADJUNCT TOMO
ULTRASOUND RIGHT BREAST
ULTRASOUND LEFT AXILLA

[L MLO synth-2D]
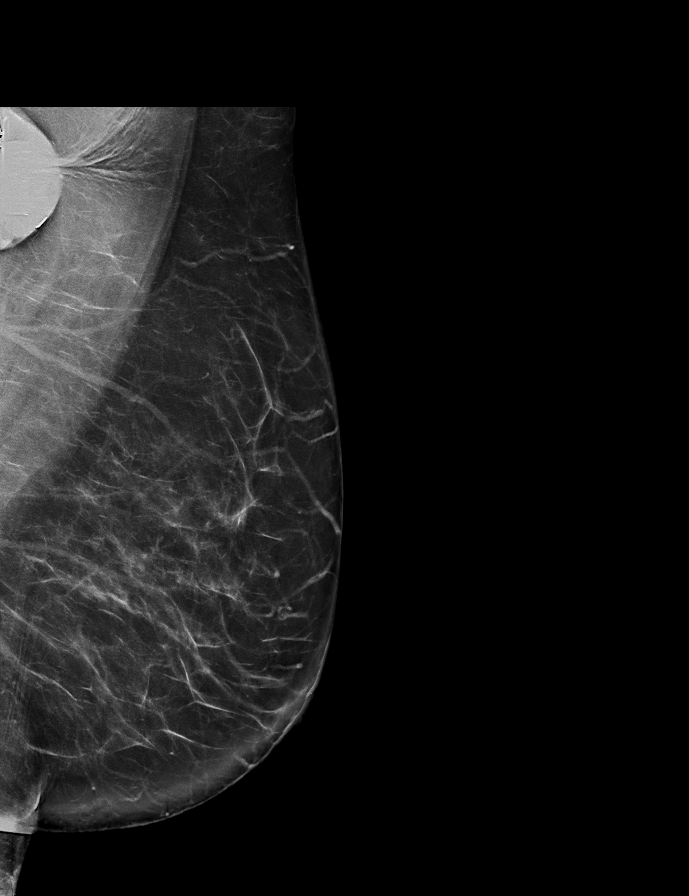

[R MLO synth-2D]
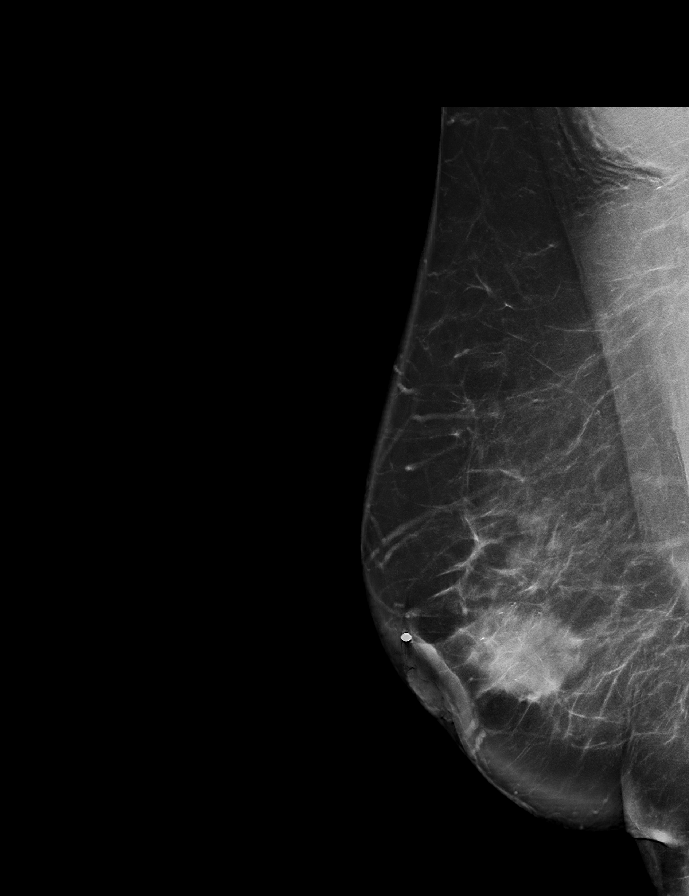

[L CC synth-2D]
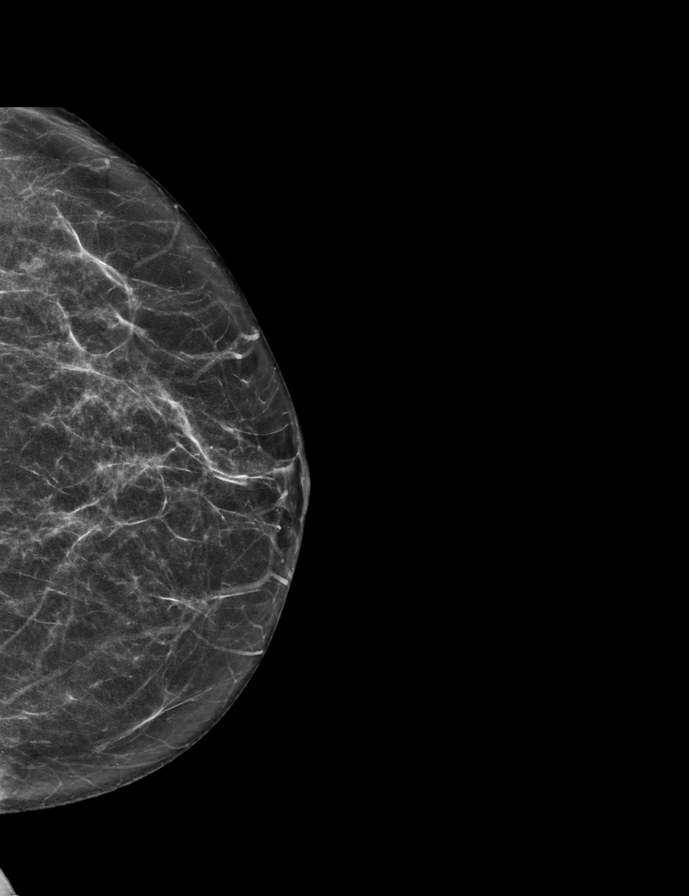

[R CC synth-2D]
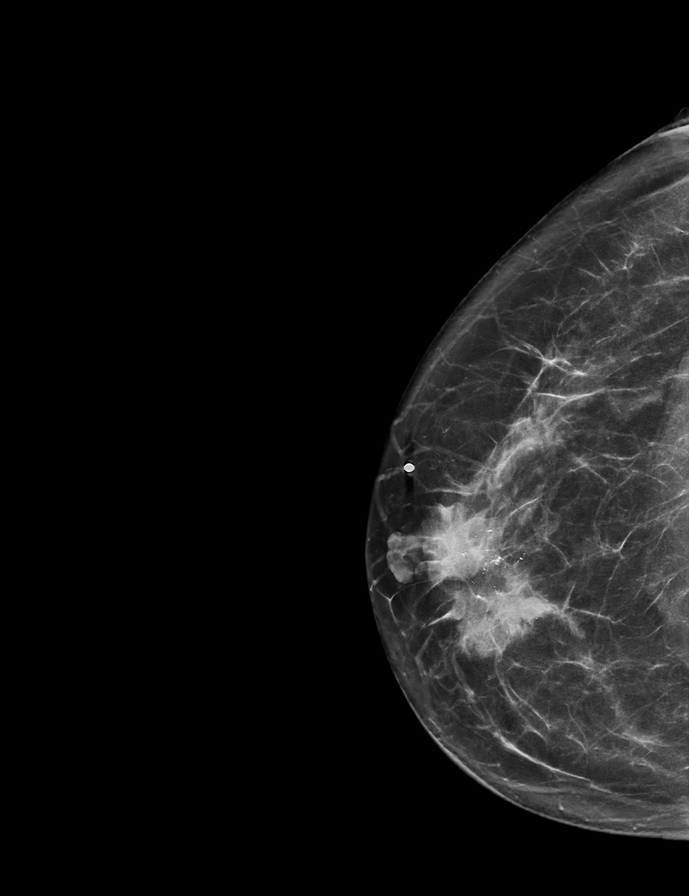

[L CC]
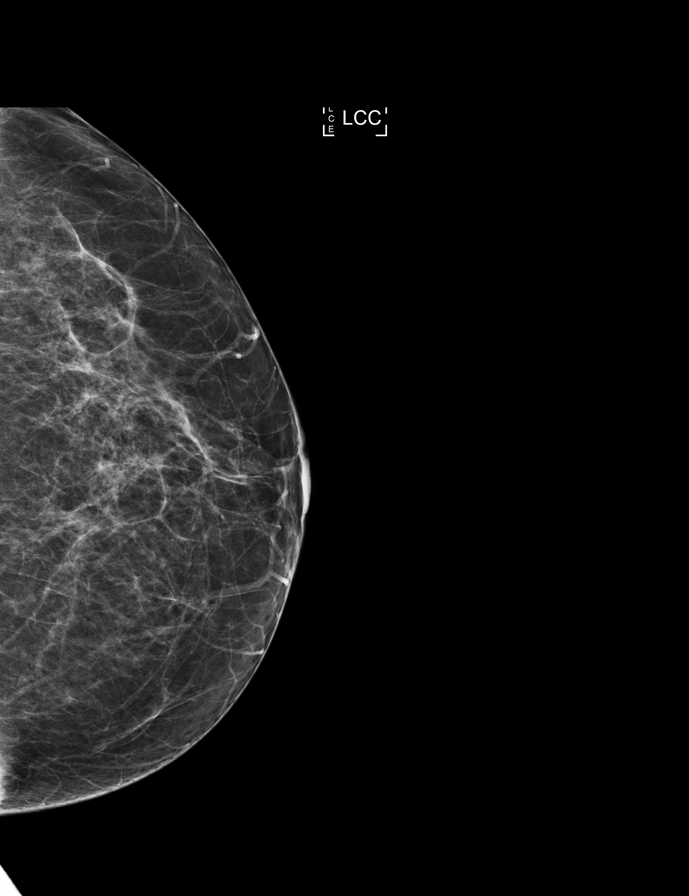

[R TAN synth-2D]
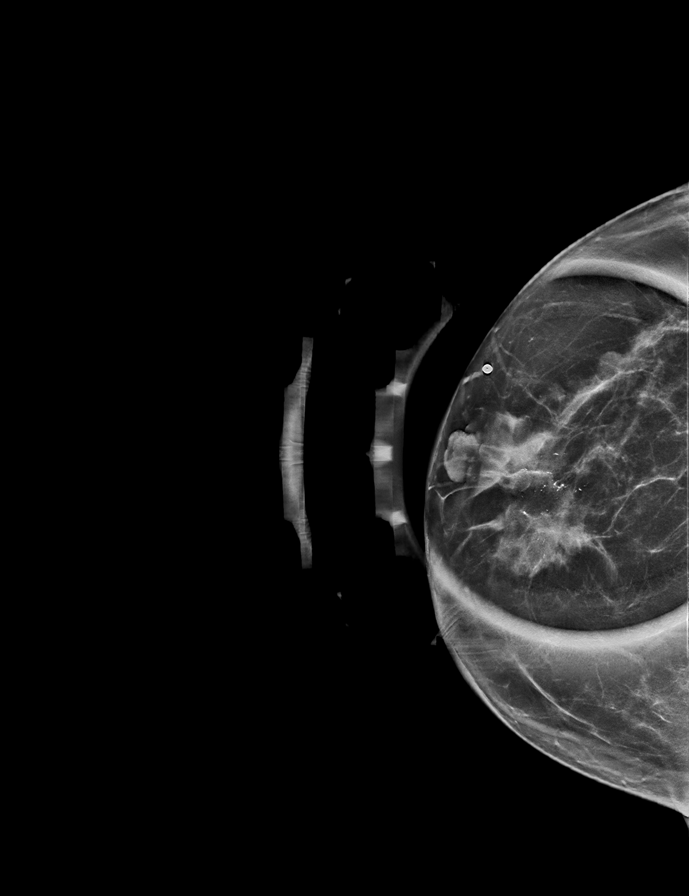

[L MLO]
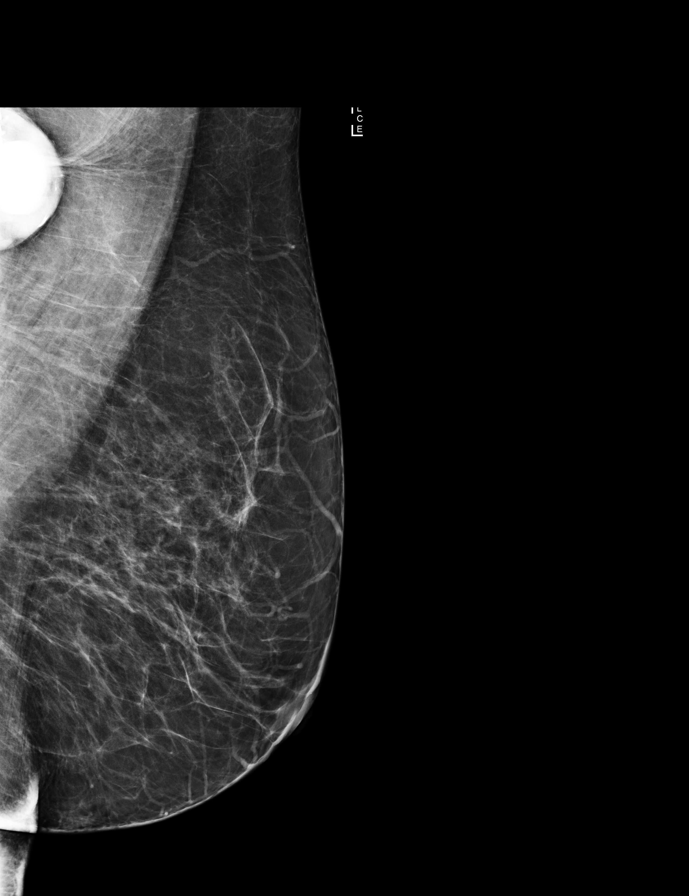

[R TAN]
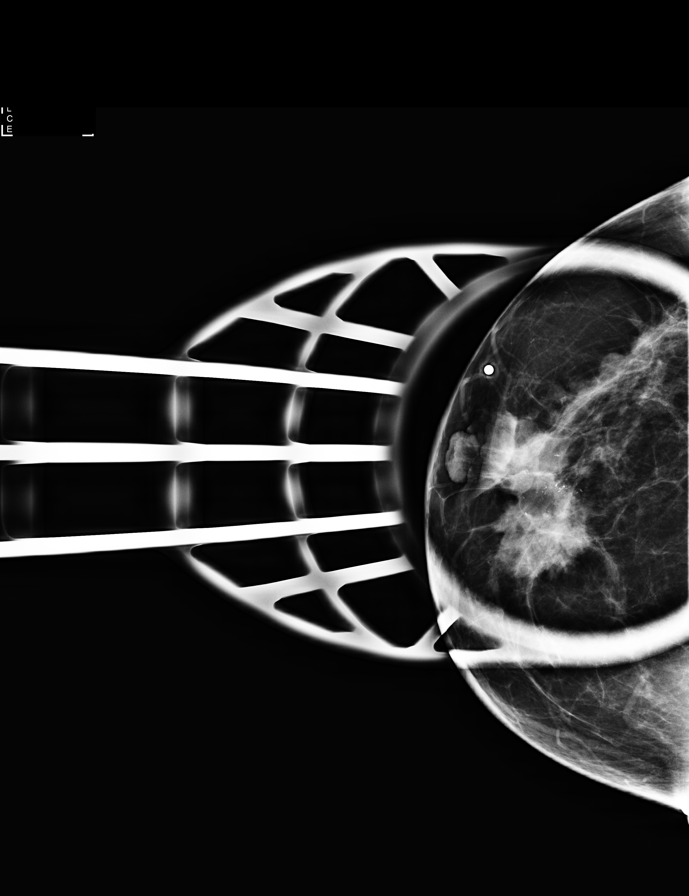

[8 of 35 positions shown; findings below may reference images not displayed]

ACR Breast Density Category b: There are scattered areas of
fibroglandular density.
FINDINGS: Mammographically, there are no suspicious masses, areas of
architectural distortion or microcalcifications in the left breast.
There is a prominent left axillary lymph node. Injectable port
overlies the left axilla.

In the right subareolar breast, there is a hyperdense spiculated
mass, measuring 3.8 x 1.9 x 2.4 cm mammographically. Malignant type
calcifications are seen associated with the mass. Two smaller
satellite nodules are also seen in the right slightly upper outer
quadrant, middle depth. There is skin retraction and periareolar
skin thickening.

Mammographic images were processed with CAD.

On physical exam, there is a firm fixed mass in the subareolar right
breast. There is skin dimpling and nipple retraction. There is
darker discoloration of the nipple.

Targeted right breast ultrasound is performed, showing highly
suspicious irregular hypoechoic mass in the subareolar right 1
o'clock breast, which extends to the nipple. The mass measures
approximately 4.2 by 2.1 by 3.1 cm. Interspersed microcalcifications
are seen within. There are 3 satellite nodules in the right breast 9
o'clock in linear distribution, the farther of which is located
cm from the border of the index mass. The middle and largest
satellite nodule measures 1.0 x 0.6 x 0.7 cm.

Ultrasound examination of the right axilla demonstrates 2 abnormal
right axillary lymph nodes with cortical thickening of up to 8 mm.

Ultrasound examination of the left axilla demonstrates
benign-appearing lymph nodes. No evidence of left axillary
lymphadenopathy.
IMPRESSION: Highly suspicious calcifications containing right subareolar breast
mass, involving the nipple and all 4 quadrants, measuring 4.2 x
x 3.1 cm sonographically.

Three satellite nodules in linear distribution in the right 9
o'clock breast, the farthest of which is located 4.1 cm from the
lateral border of the index mass.

Two adjacent abnormal right axillary lymph nodes.

No evidence of left breast malignancy or left axillary
lymphadenopathy.

RECOMMENDATION:
Ultrasound-guided core needle biopsy of the right subareolar breast
mass, the farthest 9 o'clock satellite nodule, and an abnormal right
axillary lymph node.

Patient is on anticoagulation therapy, including Plavix and aspirin.
Her physician Dr. Jorgeluis recommends that the patient is taken off
aspirin, but she should continue her Plavix at the time of her
biopsy.

I have discussed the findings and recommendations with the patient.
Results were also provided in writing at the conclusion of the
visit. If applicable, a reminder letter will be sent to the patient
regarding the next appointment.

BI-RADS CATEGORY  5: Highly suggestive of malignancy.

## 2018-10-05 ENCOUNTER — Telehealth: Payer: Self-pay | Admitting: Hematology and Oncology

## 2018-10-05 NOTE — Assessment & Plan Note (Signed)
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  Treatment summary: 1. Neoadjuvant chemotherapy with Taxotereand Cytoxan every 3 weeks4completed July 2018 2.01/30/2017:Right mastectomy: IDC grade 3, 7.2 cm, high-grade DCIS, lymphovascular invasion present including dermal lymphatics, perineural invasion present, margins negative, 4/5 lymph nodes positive with extracapsular extension, ER 100%, PR 100%, HER-2 negative ratio 1.41 3.Adjuvant radiation therapy3/09/2017-07/18/2017 5.Followed by antiestrogen therapy with tamoxifen --------------------------------------------------------------------- Current treatment: Anastrozole 1 mg daily to start 04/13/2018, patient had not had menstrual cycle since 2010  Anastrozole toxicities: 1.  Hot flashes: On Effexor 75 mg a day  Breast cancer surveillance: 1.  Breast exam 04/13/2018: Benign 2.  Mammogram: 05/13/2018: No evidence of malignancy, breast density category B  Return to clinic in 1 year for follow-up

## 2018-10-05 NOTE — Telephone Encounter (Signed)
I talk with patient regarding video visit °

## 2018-10-09 ENCOUNTER — Telehealth: Payer: Self-pay | Admitting: Hematology and Oncology

## 2018-10-09 NOTE — Progress Notes (Signed)
HEMATOLOGY-ONCOLOGY MYCHART VIDEO VISIT PROGRESS NOTE  I connected with [name] on 10/12/2018 at 10:00 AM EDT by MyChart video conference and verified that I am speaking with the correct person using two identifiers.  I discussed the limitations, risks, security and privacy concerns of performing an evaluation and management service by MyChart and the availability of in person appointments.  I also discussed with the patient that there may be a patient responsible charge related to this service. The patient expressed understanding and agreed to proceed.  Patient's Location: Home Physician Location: Clinic  CHIEF COMPLIANT: Follow-up of right breast cancer on anastrozole  INTERVAL HISTORY: [name] is a 48 y.o. female with above-mentioned history of breast cancer who underwent neoadjuvant chemotherapy followed by mastectomy and radiation. She is currently on anti-estrogen therapy with anastrozole. Left breast mammogram on 05/12/18 showed no evidence of malignancy. She presents over MyChart today for 65-monthfollow-up.   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  The Hereditary Gene Panel offered by [facility] includes sequencing and/or deletion duplication testing of the following 46 genes: APC, ATM, AXIN2, BARD1, BMPR1A, BRCA1, BRCA2, BRIP1, CDH1, CDKN2A  (p14ARF), CDKN2A (p16INK4a), CHEK2, CTNNA1, DICER1, EPCAM (Deletion/duplication testing only), GREM1 (promoter region deletion/duplication testing only), KIT, MEN1, MLH1, MSH2, MSH3, MSH6, MUTYH, NBN, NF1, NHTL1, PALB2, PDGFRA, PMS2, POLD1, POLE, PTEN, RAD50, RAD51C, RAD51D, SDHB, SDHC, SDHD, SMAD4, SMARCA4. STK11, TP53, TSC1, TSC2, and VHL.  The following genes were evaluated for sequence changes only: SDHA and HOXB13 c.251G>A variant only.  The report date is Aug 22, 2016.    01/30/2017 Surgery   Right mastectomy: IDC grade 3, 7.2 cm, high-grade DCIS, lymphovascular invasion present including dermal lymphatics, perineural invasion present, margins negative, 4/5 lymph nodes positive with extracapsular extension, ER 100%, PR 100%, HER-2 negative ratio 1.41   05/29/2017 - 07/18/2017 Radiation Therapy   Adjuvant radiation therapy   04/13/2018 -  Anti-estrogen oral therapy   Initially prescribed tamoxifen but apparently she did not pick it up.  Because she is menopausal we are starting anastrozole 1 mg daily starting 04/13/2018     REVIEW OF SYSTEMS:   Constitutional: Denies fevers, chills or abnormal weight loss Eyes: Denies blurriness of vision Ears, nose, mouth, throat, and face: Denies mucositis or sore throat Respiratory: Denies cough, dyspnea or wheezes Cardiovascular: Denies palpitation, chest discomfort Gastrointestinal:  Denies nausea, heartburn or change in bowel habits Skin: Denies abnormal skin rashes Lymphatics: Denies new lymphadenopathy or easy bruising Neurological:Denies numbness, tingling or new weaknesses Behavioral/Psych: Mood is stable, no new changes  Extremities: No lower extremity edema Breast: denies any pain or lumps or nodules in either breasts All other systems were reviewed with the patient and are negative.  Observations/Objective:  There were no vitals filed for this visit. There is no height or weight on file to calculate BMI.  I have reviewed the data as  listed CMP Latest Ref Rng & Units 10/07/2017 03/13/2017 01/23/2017  Glucose 70 - 99 mg/dL 98 91 97  BUN 6 - 20 mg/dL 7 7 5(L)  Creatinine 0.44 - 1.00 mg/dL 0.64 0.68 0.71  Sodium 135 - 145 mmol/L 141 140 140  Potassium 3.5 - 5.1 mmol/L 3.8 3.9 3.4(L)  Chloride 98 - 111 mmol/L 109 111 107  CO2 22 - 32 mmol/L 20(L) 20(L) 23  Calcium 8.9 - 10.3 mg/dL 9.6 9.4 9.4  Total Protein 6.5 - 8.1 g/dL - - -  Total Bilirubin 0.3 - 1.2 mg/dL - - -  Alkaline Phos 38 - 126 U/L - - -  AST 15 - 41 U/L - - -  ALT 14 - 54 U/L - - -    Lab Results  Component Value Date   WBC 12.2 (H) 10/07/2017   HGB 13.7 10/07/2017   HCT 41.8 10/07/2017   MCV 92.3 10/07/2017   PLT 267 10/07/2017   NEUTROABS 5.1 12/19/2016      Assessment Plan:  Malignant neoplasm of overlapping sites of right breast in female, estrogen receptor positive ([place]) 07/18/2016: Palpable right breast masses for 1 year; 2 adjacent spiculated masses by ultrasound at 1:00 subareolar 4.2 cm mass; 3 satellite nodules at 9:00; 2 abnormal lymph nodes; biopsy of the 2 masses and the lymph node showed similar-appearing 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  Treatment summary: 1. Neoadjuvant chemotherapy with Taxotereand Cytoxan every 3 weeks4completed July 2018 2.01/30/2017:Right mastectomy: IDC grade 3, 7.2 cm, high-grade DCIS, lymphovascular invasion present including dermal lymphatics, perineural invasion present, margins negative, 4/5 lymph nodes positive with extracapsular extension, ER 100%, PR 100%, HER-2 negative ratio 1.41 3.Adjuvant radiation therapy3/09/2017-07/18/2017 5.Followed by antiestrogen therapy with tamoxifen --------------------------------------------------------------------- Current treatment: Anastrozole 1 mg daily to start 04/13/2018, patient had not had  menstrual cycle since 2010  Anastrozole toxicities: 1.  Hot flashes: On Effexor 75 mg a day: Currently hot flashes are fully under control Occasional muscle aches and pains  Breast cancer surveillance: 1.  Breast exam 04/13/2018: Benign 2.  Mammogram: 05/13/2018: No evidence of malignancy, breast density category B  Return to clinic in 1 year for follow-up    I discussed the assessment and treatment plan with the patient. The patient was provided an opportunity to ask questions and all were answered. The patient agreed with the plan and demonstrated an understanding of the instructions. The patient was advised to call back or seek an in-person evaluation if the symptoms worsen or if the condition fails to improve as anticipated.   I provided 15 minutes of face-to-face MyChart video visit time during this encounter.    Rulon Eisenmenger, MD 10/12/2018   I, Molly Dorshimer, am acting as scribe for Nicholas Lose, MD.  I have reviewed the above documentation for accuracy and completeness, and I agree with the above.

## 2018-10-12 ENCOUNTER — Inpatient Hospital Stay: Payer: Medicaid Other | Attending: Hematology and Oncology | Admitting: Hematology and Oncology

## 2018-10-12 DIAGNOSIS — Z923 Personal history of irradiation: Secondary | ICD-10-CM

## 2018-10-12 DIAGNOSIS — C50811 Malignant neoplasm of overlapping sites of right female breast: Secondary | ICD-10-CM | POA: Diagnosis not present

## 2018-10-12 DIAGNOSIS — Z79899 Other long term (current) drug therapy: Secondary | ICD-10-CM

## 2018-10-12 DIAGNOSIS — Z170 Estrogen receptor positive status [ER+]: Secondary | ICD-10-CM | POA: Diagnosis not present

## 2018-10-12 DIAGNOSIS — Z9221 Personal history of antineoplastic chemotherapy: Secondary | ICD-10-CM

## 2018-10-12 DIAGNOSIS — Z9011 Acquired absence of right breast and nipple: Secondary | ICD-10-CM | POA: Diagnosis not present

## 2018-10-12 DIAGNOSIS — N951 Menopausal and female climacteric states: Secondary | ICD-10-CM

## 2018-10-12 MED ORDER — ANASTROZOLE 1 MG PO TABS: 1.0000 mg | ORAL_TABLET | Freq: Every day | ORAL | 3 refills | Status: DC

## 2018-10-12 MED ORDER — VENLAFAXINE HCL ER 75 MG PO CP24: 75.0000 mg | ORAL_CAPSULE | Freq: Every day | ORAL | 11 refills | Status: DC

## 2019-03-08 ENCOUNTER — Other Ambulatory Visit: Payer: Self-pay

## 2019-03-08 ENCOUNTER — Other Ambulatory Visit: Payer: Self-pay | Admitting: Cardiovascular Disease

## 2019-03-09 MED ORDER — ISOSORBIDE MONONITRATE ER 60 MG PO TB24: 60.0000 mg | ORAL_TABLET | Freq: Every day | ORAL | 0 refills | Status: DC

## 2019-03-21 IMAGING — US US PLC BREAST LOC DEV 1ST LEASION INC US GUIDE*R*
1 series · 3 of 3 positions shown · non-contrast
Comparison: Previous exam(s).

CLINICAL DATA: 46-year-old female presenting for radioactive seed
localization of a right axillary lymph node.

EXAM:
ULTRASOUND GUIDED RADIOACTIVE SEED LOCALIZATION OF THE RIGHT AXILLA

[Series 1: us plc breast loc dev 1st leasion inc us guide*rig · 0.07mm/px · 3 of 3 slices shown]
[im 1/3]
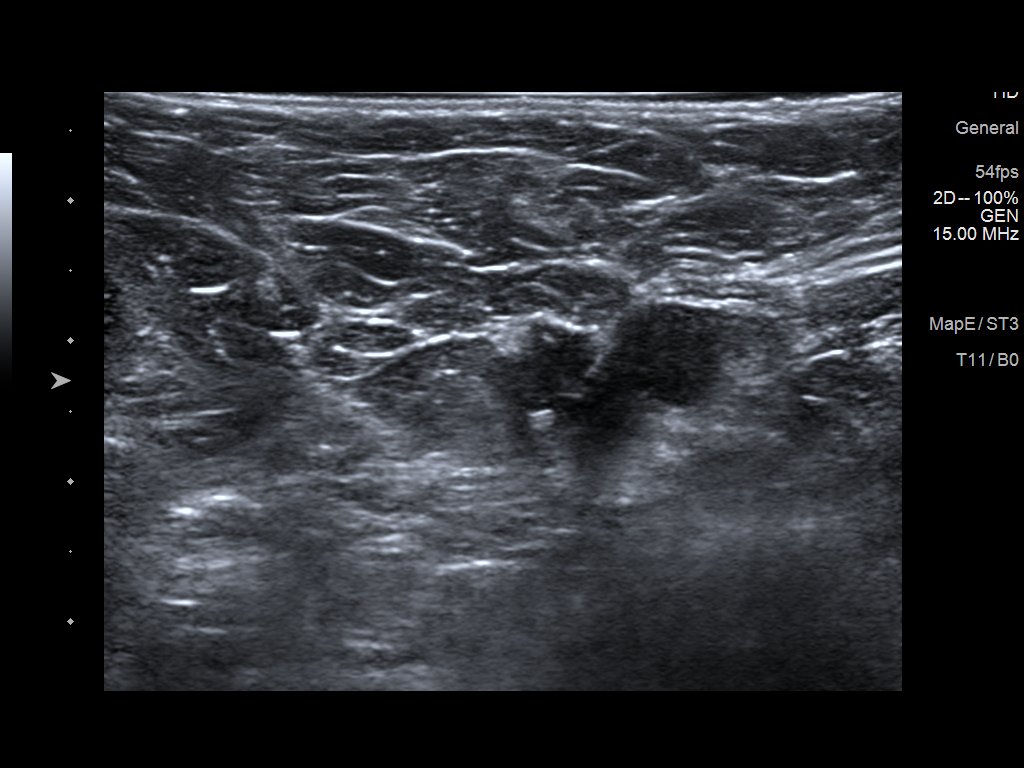
[im 2/3]
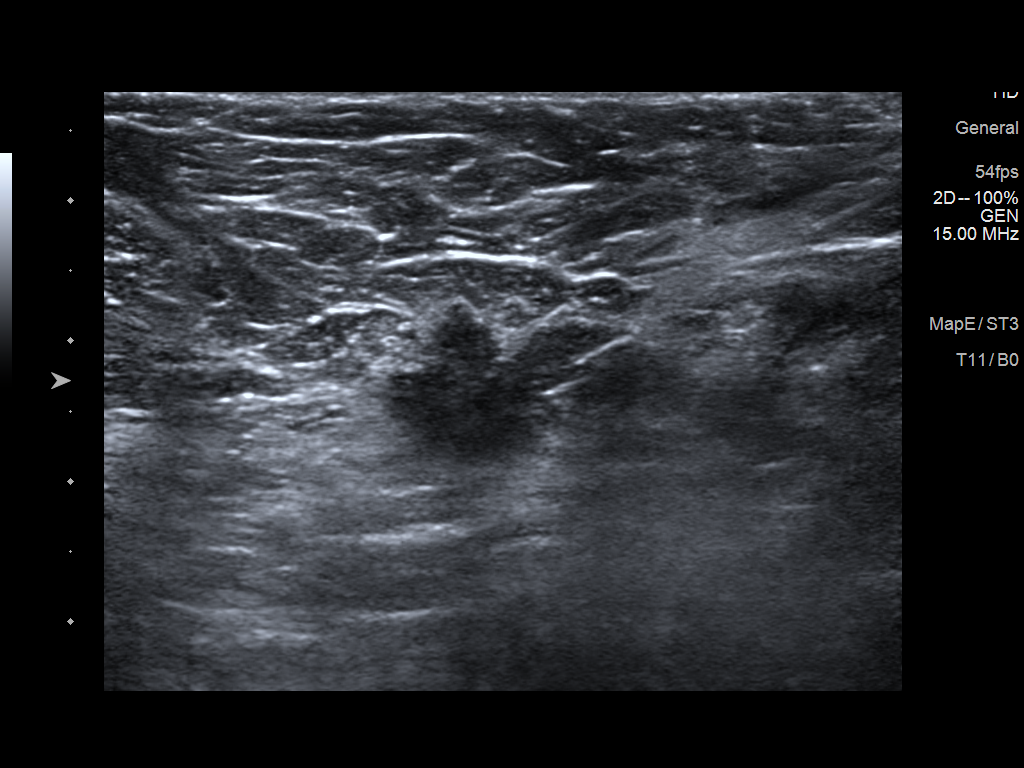
[im 3/3]
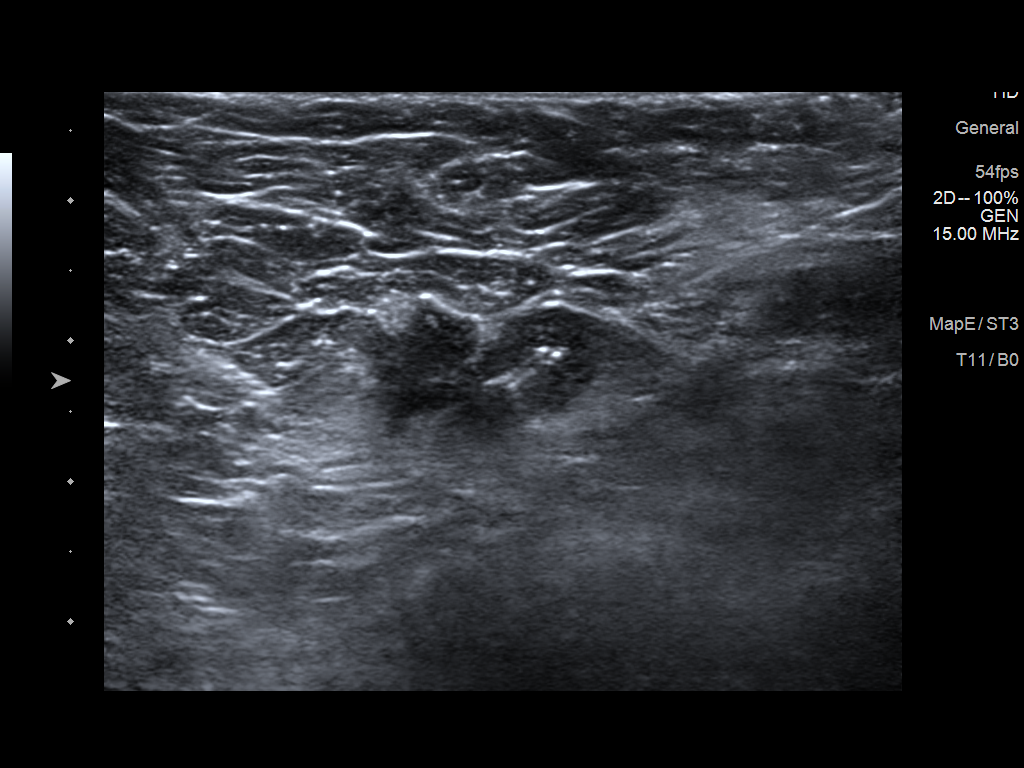

[3 of 3 positions shown; findings below may reference images not displayed]

FINDINGS: Patient presents for radioactive seed localization prior to lymph
node excision. I met with the patient and we discussed the procedure
of seed localization including benefits and alternatives. We
discussed the high likelihood of a successful procedure. We
discussed the risks of the procedure including infection, bleeding,
tissue injury and further surgery. We discussed the low dose of
radioactivity involved in the procedure. Informed, written consent
was given.

The usual time-out protocol was performed immediately prior to the
procedure.

Using ultrasound guidance, sterile technique, 1% lidocaine and an
[id number] radioactive seed, the abnormal right axillary lymph node with
biopsy marking clip was localized using an inferior approach. The
follow-up mammogram images confirm the seed in the expected location
and were marked for Dr. Raider.

Follow-up survey of the patient confirms presence of the radioactive
seed.

Order number of [id number] seed:  [id number].

Total activity: 0.257 millicuries Reference Date: 01/09/2017

The patient tolerated the procedure well and was released from the
[REDACTED]. She was given instructions regarding seed removal.
IMPRESSION: Radioactive seed localization of a right axillary lymph node. No
apparent complications.

## 2019-03-21 IMAGING — MG MM BREAST LOCALIZATION CLIP
3 series · 3 of 3 positions shown · non-contrast
Comparison: Previous exam(s).

CLINICAL DATA: Post procedural mammogram for radioactive seed
placement.

EXAM:
DIAGNOSTIC RIGHT MAMMOGRAM POST ULTRASOUND GUIDED RADIOACTIVE SEED
LOCALIZATION

[R MLO (1 of 3)]
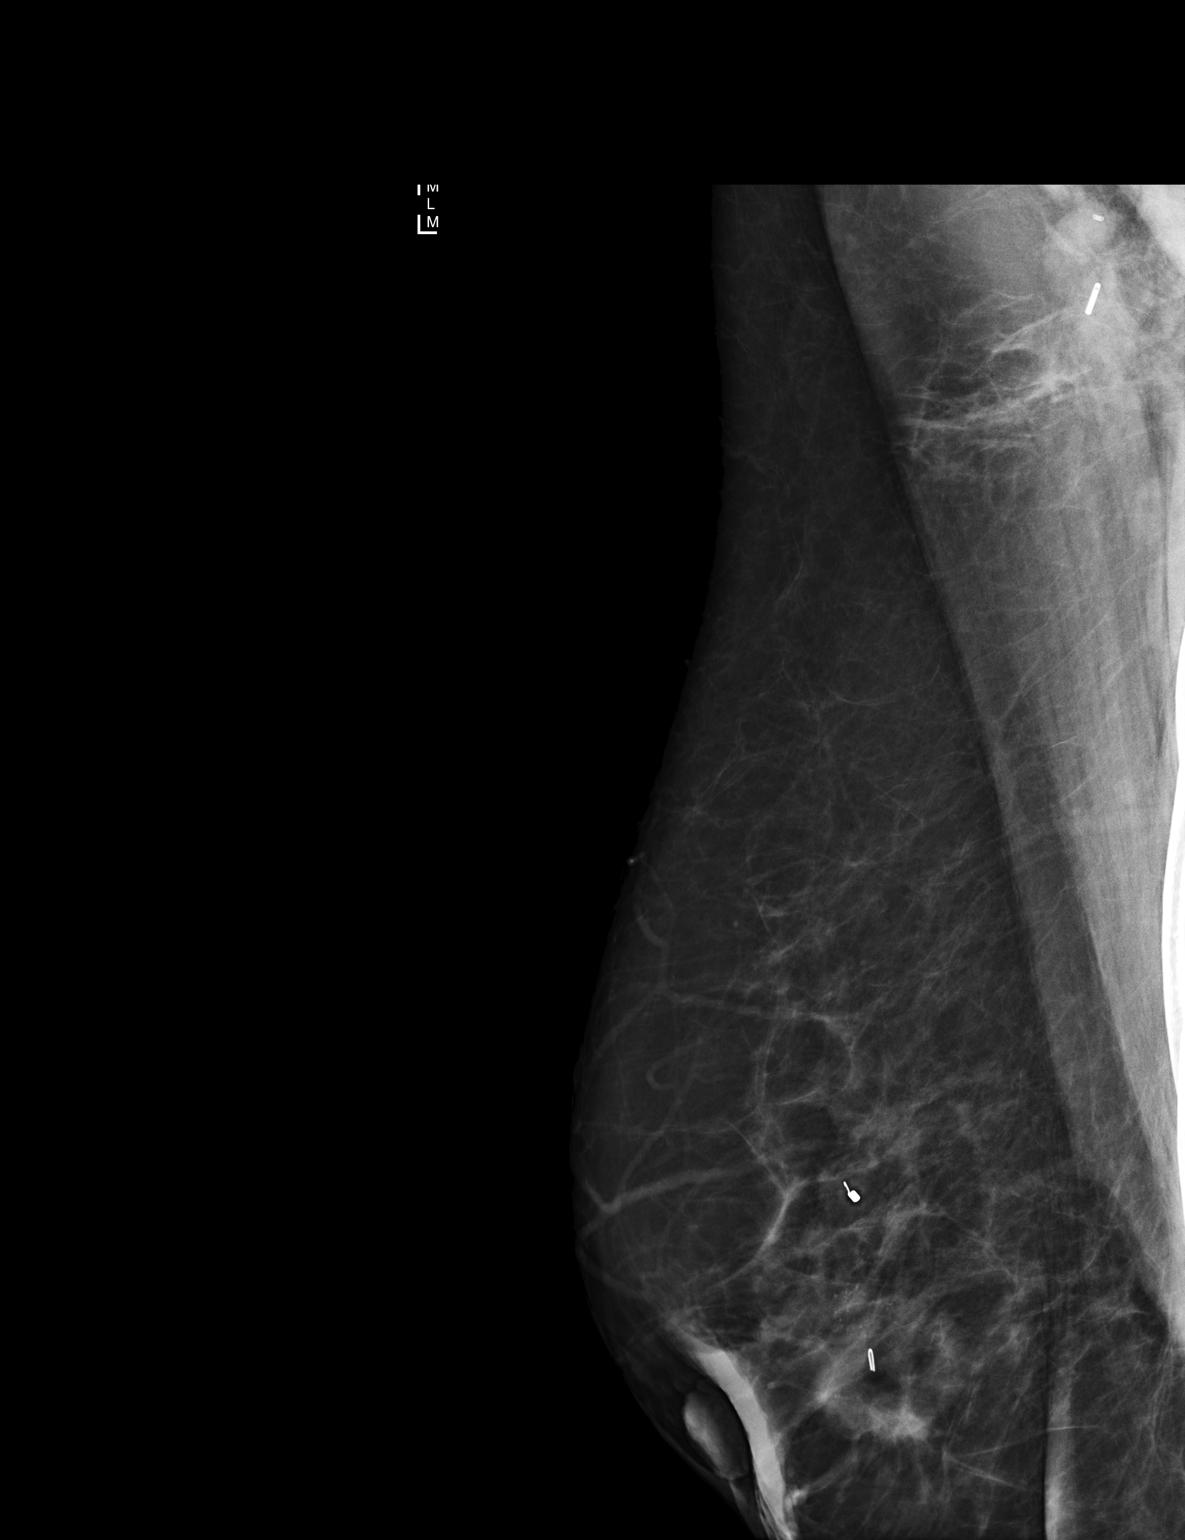

[R MLO (2 of 3)]
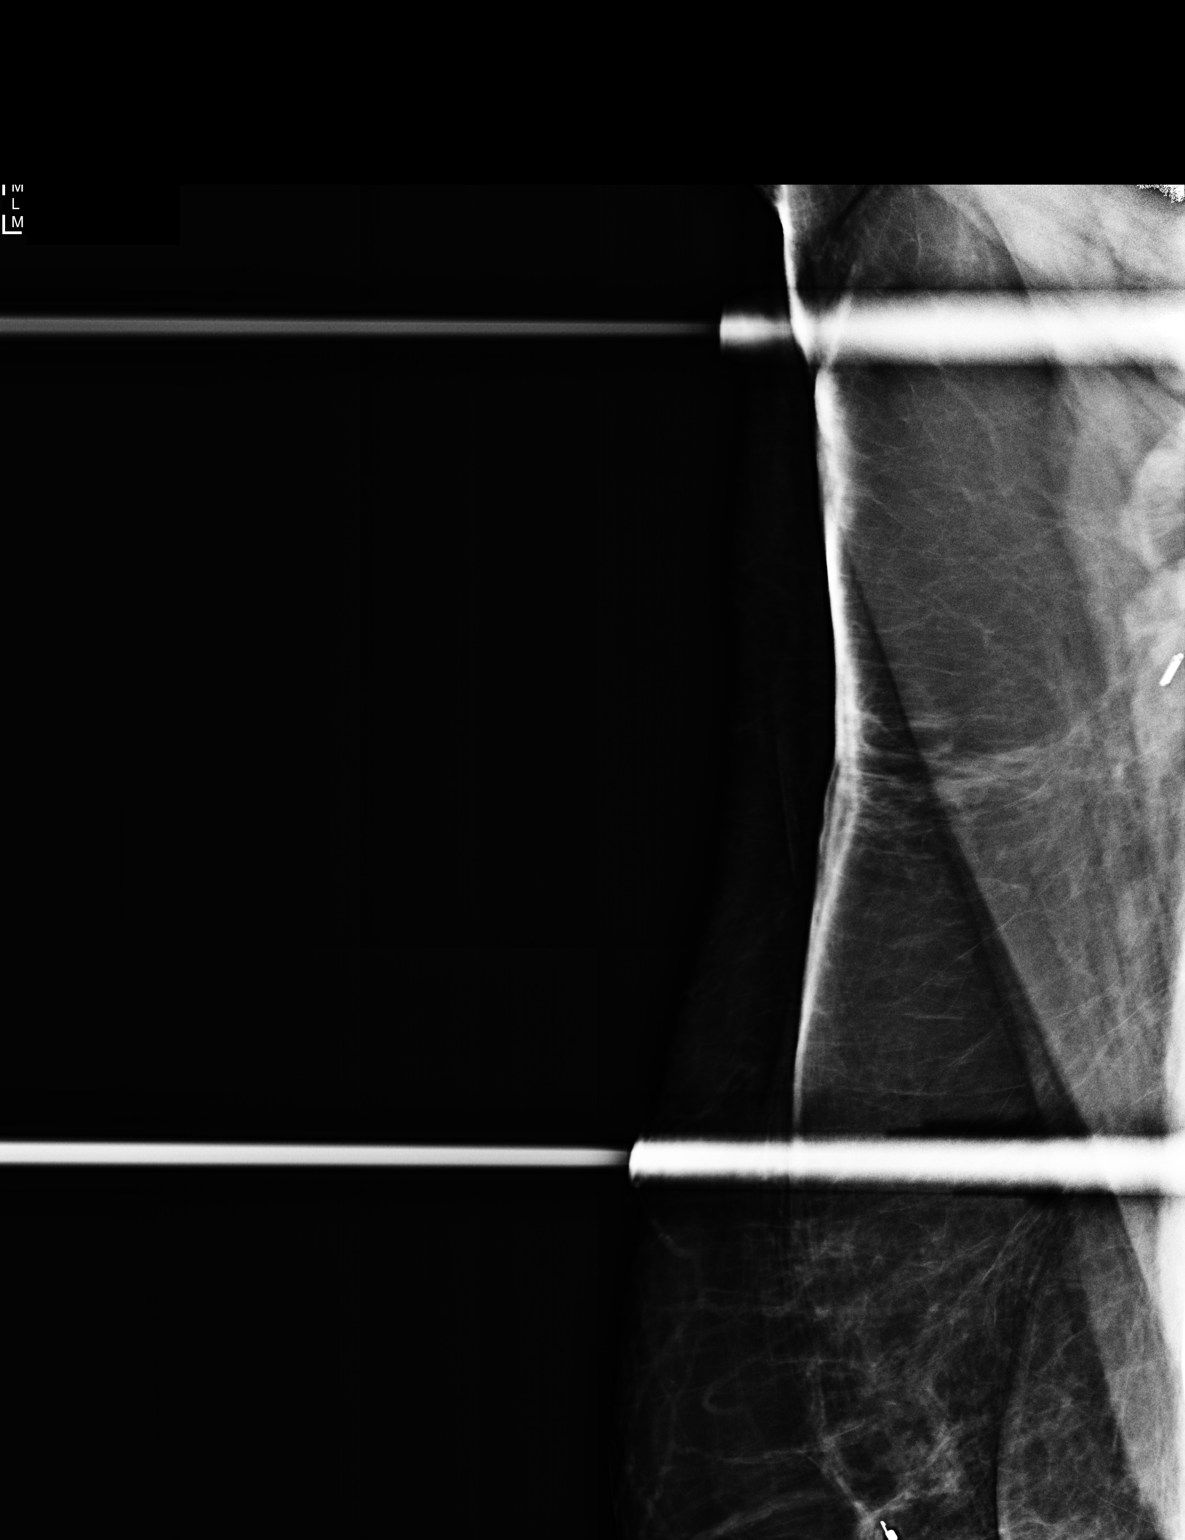

[R MLO (3 of 3)]
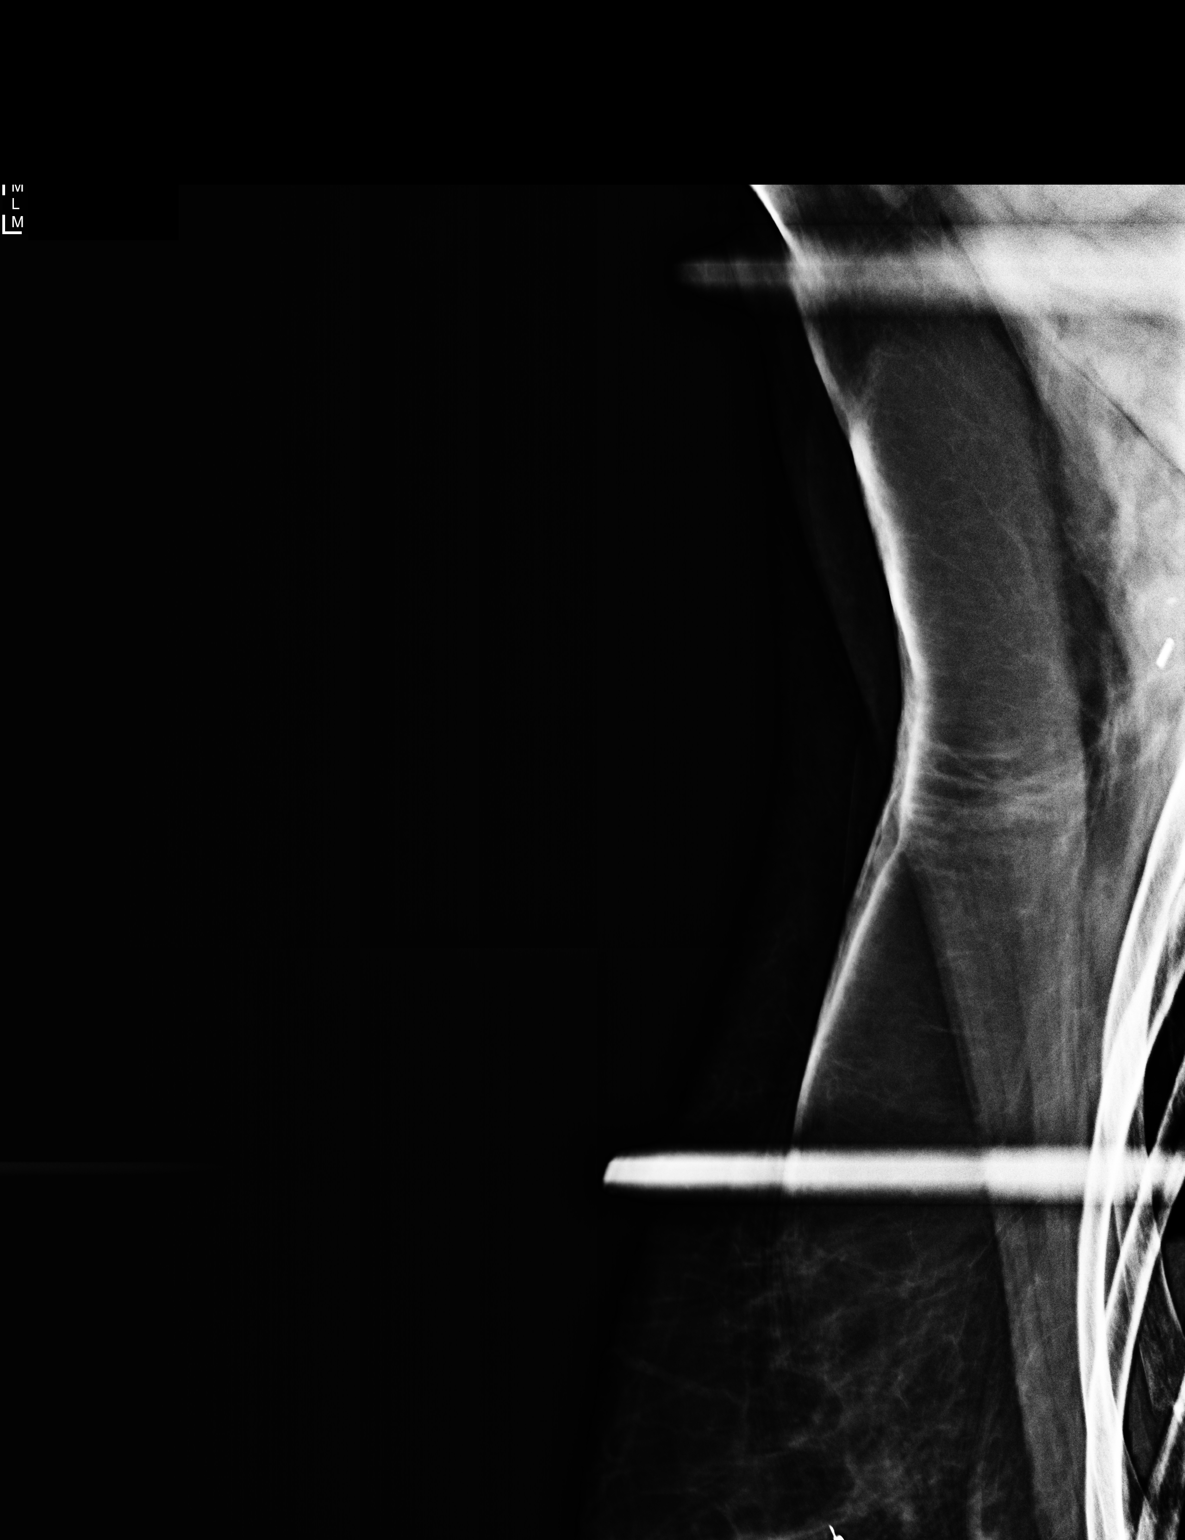

[3 of 3 positions shown; findings below may reference images not displayed]

FINDINGS: Mammographic images were obtained following ultrasound guided
localization of a right axillary lymph node with a radioactive seed.
The seed is positioned adjacent to the spiral shaped biopsy marking
clip within the abnormal right axillary lymph node.
IMPRESSION: Appropriate positioning of the radioactive seed adjacent to the
lymph node containing the spiral shaped biopsy marking clip.

Final Assessment: Post Procedure Mammograms for Marker Placement

## 2019-03-26 DIAGNOSIS — J9690 Respiratory failure, unspecified, unspecified whether with hypoxia or hypercapnia: Secondary | ICD-10-CM

## 2019-03-26 HISTORY — DX: Respiratory failure, unspecified, unspecified whether with hypoxia or hypercapnia: J96.90

## 2019-04-11 ENCOUNTER — Emergency Department (HOSPITAL_COMMUNITY): Payer: Medicaid Other

## 2019-04-11 ENCOUNTER — Other Ambulatory Visit: Payer: Self-pay

## 2019-04-11 ENCOUNTER — Inpatient Hospital Stay (HOSPITAL_COMMUNITY): Payer: Medicaid Other

## 2019-04-11 ENCOUNTER — Inpatient Hospital Stay (HOSPITAL_COMMUNITY)
Admission: EM | Admit: 2019-04-11 | Discharge: 2019-04-16 | DRG: 286 | Disposition: A | Discharge status: Home / Self Care | Payer: Medicaid Other | Attending: Internal Medicine | Admitting: Internal Medicine

## 2019-04-11 ENCOUNTER — Encounter (HOSPITAL_COMMUNITY): Payer: Self-pay | Admitting: Emergency Medicine

## 2019-04-11 DIAGNOSIS — I701 Atherosclerosis of renal artery: Secondary | ICD-10-CM | POA: Diagnosis not present

## 2019-04-11 DIAGNOSIS — I161 Hypertensive emergency: Secondary | ICD-10-CM

## 2019-04-11 DIAGNOSIS — Z923 Personal history of irradiation: Secondary | ICD-10-CM | POA: Diagnosis not present

## 2019-04-11 DIAGNOSIS — I5022 Chronic systolic (congestive) heart failure: Secondary | ICD-10-CM

## 2019-04-11 DIAGNOSIS — I340 Nonrheumatic mitral (valve) insufficiency: Secondary | ICD-10-CM | POA: Diagnosis not present

## 2019-04-11 DIAGNOSIS — F17210 Nicotine dependence, cigarettes, uncomplicated: Secondary | ICD-10-CM | POA: Diagnosis present

## 2019-04-11 DIAGNOSIS — I361 Nonrheumatic tricuspid (valve) insufficiency: Secondary | ICD-10-CM

## 2019-04-11 DIAGNOSIS — E785 Hyperlipidemia, unspecified: Secondary | ICD-10-CM | POA: Diagnosis present

## 2019-04-11 DIAGNOSIS — Z452 Encounter for adjustment and management of vascular access device: Secondary | ICD-10-CM

## 2019-04-11 DIAGNOSIS — I252 Old myocardial infarction: Secondary | ICD-10-CM | POA: Diagnosis not present

## 2019-04-11 DIAGNOSIS — Z20822 Contact with and (suspected) exposure to covid-19: Secondary | ICD-10-CM | POA: Diagnosis present

## 2019-04-11 DIAGNOSIS — I248 Other forms of acute ischemic heart disease: Secondary | ICD-10-CM | POA: Diagnosis present

## 2019-04-11 DIAGNOSIS — R739 Hyperglycemia, unspecified: Secondary | ICD-10-CM | POA: Diagnosis present

## 2019-04-11 DIAGNOSIS — I25110 Atherosclerotic heart disease of native coronary artery with unstable angina pectoris: Secondary | ICD-10-CM | POA: Diagnosis not present

## 2019-04-11 DIAGNOSIS — Z9221 Personal history of antineoplastic chemotherapy: Secondary | ICD-10-CM | POA: Diagnosis not present

## 2019-04-11 DIAGNOSIS — I428 Other cardiomyopathies: Secondary | ICD-10-CM | POA: Diagnosis present

## 2019-04-11 DIAGNOSIS — Z6830 Body mass index (BMI) 30.0-30.9, adult: Secondary | ICD-10-CM

## 2019-04-11 DIAGNOSIS — I2510 Atherosclerotic heart disease of native coronary artery without angina pectoris: Secondary | ICD-10-CM | POA: Diagnosis present

## 2019-04-11 DIAGNOSIS — J9602 Acute respiratory failure with hypercapnia: Secondary | ICD-10-CM | POA: Diagnosis not present

## 2019-04-11 DIAGNOSIS — T380X5A Adverse effect of glucocorticoids and synthetic analogues, initial encounter: Secondary | ICD-10-CM | POA: Diagnosis present

## 2019-04-11 DIAGNOSIS — F419 Anxiety disorder, unspecified: Secondary | ICD-10-CM | POA: Diagnosis present

## 2019-04-11 DIAGNOSIS — E669 Obesity, unspecified: Secondary | ICD-10-CM | POA: Diagnosis present

## 2019-04-11 DIAGNOSIS — Z978 Presence of other specified devices: Secondary | ICD-10-CM

## 2019-04-11 DIAGNOSIS — Z853 Personal history of malignant neoplasm of breast: Secondary | ICD-10-CM

## 2019-04-11 DIAGNOSIS — Z79899 Other long term (current) drug therapy: Secondary | ICD-10-CM

## 2019-04-11 DIAGNOSIS — I10 Essential (primary) hypertension: Secondary | ICD-10-CM | POA: Diagnosis not present

## 2019-04-11 DIAGNOSIS — F329 Major depressive disorder, single episode, unspecified: Secondary | ICD-10-CM | POA: Diagnosis present

## 2019-04-11 DIAGNOSIS — I5021 Acute systolic (congestive) heart failure: Secondary | ICD-10-CM | POA: Diagnosis not present

## 2019-04-11 DIAGNOSIS — R0602 Shortness of breath: Secondary | ICD-10-CM

## 2019-04-11 DIAGNOSIS — D72829 Elevated white blood cell count, unspecified: Secondary | ICD-10-CM | POA: Diagnosis not present

## 2019-04-11 DIAGNOSIS — R309 Painful micturition, unspecified: Secondary | ICD-10-CM | POA: Diagnosis present

## 2019-04-11 DIAGNOSIS — J439 Emphysema, unspecified: Secondary | ICD-10-CM | POA: Diagnosis not present

## 2019-04-11 DIAGNOSIS — J9601 Acute respiratory failure with hypoxia: Secondary | ICD-10-CM | POA: Diagnosis present

## 2019-04-11 DIAGNOSIS — Z7951 Long term (current) use of inhaled steroids: Secondary | ICD-10-CM

## 2019-04-11 DIAGNOSIS — I5023 Acute on chronic systolic (congestive) heart failure: Secondary | ICD-10-CM | POA: Diagnosis present

## 2019-04-11 DIAGNOSIS — I25118 Atherosclerotic heart disease of native coronary artery with other forms of angina pectoris: Secondary | ICD-10-CM | POA: Diagnosis not present

## 2019-04-11 DIAGNOSIS — Z79811 Long term (current) use of aromatase inhibitors: Secondary | ICD-10-CM | POA: Diagnosis not present

## 2019-04-11 DIAGNOSIS — E876 Hypokalemia: Secondary | ICD-10-CM | POA: Diagnosis present

## 2019-04-11 DIAGNOSIS — J80 Acute respiratory distress syndrome: Secondary | ICD-10-CM | POA: Diagnosis present

## 2019-04-11 DIAGNOSIS — K219 Gastro-esophageal reflux disease without esophagitis: Secondary | ICD-10-CM | POA: Diagnosis present

## 2019-04-11 DIAGNOSIS — Z8572 Personal history of non-Hodgkin lymphomas: Secondary | ICD-10-CM | POA: Diagnosis not present

## 2019-04-11 DIAGNOSIS — I25119 Atherosclerotic heart disease of native coronary artery with unspecified angina pectoris: Secondary | ICD-10-CM | POA: Diagnosis present

## 2019-04-11 DIAGNOSIS — R062 Wheezing: Secondary | ICD-10-CM

## 2019-04-11 DIAGNOSIS — Z7982 Long term (current) use of aspirin: Secondary | ICD-10-CM

## 2019-04-11 DIAGNOSIS — C50811 Malignant neoplasm of overlapping sites of right female breast: Secondary | ICD-10-CM

## 2019-04-11 DIAGNOSIS — J449 Chronic obstructive pulmonary disease, unspecified: Secondary | ICD-10-CM | POA: Diagnosis present

## 2019-04-11 DIAGNOSIS — I150 Renovascular hypertension: Secondary | ICD-10-CM | POA: Diagnosis present

## 2019-04-11 DIAGNOSIS — J9690 Respiratory failure, unspecified, unspecified whether with hypoxia or hypercapnia: Secondary | ICD-10-CM | POA: Diagnosis present

## 2019-04-11 LAB — POCT I-STAT 7, (LYTES, BLD GAS, ICA,H+H)
Acid-base deficit: 4 mmol/L — ABNORMAL HIGH (ref 0.0–2.0)
Acid-base deficit: 4 mmol/L — ABNORMAL HIGH (ref 0.0–2.0)
Acid-base deficit: 4 mmol/L — ABNORMAL HIGH (ref 0.0–2.0)
Bicarbonate: 28.8 mmol/L — ABNORMAL HIGH (ref 20.0–28.0)
Bicarbonate: 25 mmol/L (ref 20.0–28.0)
Bicarbonate: 20.8 mmol/L (ref 20.0–28.0)
Calcium, Ion: 1.32 mmol/L (ref 1.15–1.40)
Calcium, Ion: 1.23 mmol/L (ref 1.15–1.40)
Calcium, Ion: 1.17 mmol/L (ref 1.15–1.40)
HCT: 43 % (ref 36.0–46.0)
HCT: 41 % (ref 36.0–46.0)
HCT: 41 % (ref 36.0–46.0)
Hemoglobin: 14.6 g/dL (ref 12.0–15.0)
Hemoglobin: 13.9 g/dL (ref 12.0–15.0)
Hemoglobin: 13.9 g/dL (ref 12.0–15.0)
O2 Saturation: 92 %
O2 Saturation: 95 %
O2 Saturation: 99 %
Patient temperature: 99
Patient temperature: 99.6
Patient temperature: 99.8
Potassium: 3.3 mmol/L — ABNORMAL LOW (ref 3.5–5.1)
Potassium: 3.4 mmol/L — ABNORMAL LOW (ref 3.5–5.1)
Potassium: 3.8 mmol/L (ref 3.5–5.1)
Sodium: 145 mmol/L (ref 135–145)
Sodium: 145 mmol/L (ref 135–145)
Sodium: 142 mmol/L (ref 135–145)
TCO2: 32 mmol/L (ref 22–32)
TCO2: 27 mmol/L (ref 22–32)
TCO2: 22 mmol/L (ref 22–32)
pCO2 arterial: 96.8 mmHg (ref 32.0–48.0)
pCO2 arterial: 66.4 mmHg (ref 32.0–48.0)
pCO2 arterial: 38.1 mmHg (ref 32.0–48.0)
pH, Arterial: 7.082 — CL (ref 7.350–7.450)
pH, Arterial: 7.187 — CL (ref 7.350–7.450)
pH, Arterial: 7.349 — ABNORMAL LOW (ref 7.350–7.450)
pO2, Arterial: 91 mmHg (ref 83.0–108.0)
pO2, Arterial: 99 mmHg (ref 83.0–108.0)
pO2, Arterial: 140 mmHg — ABNORMAL HIGH (ref 83.0–108.0)

## 2019-04-11 LAB — CBC WITH DIFFERENTIAL/PLATELET
Abs Immature Granulocytes: 0.36 10*3/uL — ABNORMAL HIGH (ref 0.00–0.07)
Abs Immature Granulocytes: 0 10*3/uL (ref 0.00–0.07)
Band Neutrophils: 7 %
Basophils Absolute: 0.2 10*3/uL — ABNORMAL HIGH (ref 0.0–0.1)
Basophils Absolute: 0 10*3/uL (ref 0.0–0.1)
Basophils Relative: 1 %
Basophils Relative: 0 %
Eosinophils Absolute: 0.4 10*3/uL (ref 0.0–0.5)
Eosinophils Absolute: 0 10*3/uL (ref 0.0–0.5)
Eosinophils Relative: 2 %
Eosinophils Relative: 0 %
HCT: 46.5 % — ABNORMAL HIGH (ref 36.0–46.0)
HCT: 47 % — ABNORMAL HIGH (ref 36.0–46.0)
Hemoglobin: 14.1 g/dL (ref 12.0–15.0)
Hemoglobin: 14.4 g/dL (ref 12.0–15.0)
Immature Granulocytes: 2 %
Lymphocytes Relative: 53 %
Lymphocytes Relative: 10 %
Lymphs Abs: 9.2 10*3/uL — ABNORMAL HIGH (ref 0.7–4.0)
Lymphs Abs: 2.7 10*3/uL (ref 0.7–4.0)
MCH: 30.1 pg (ref 26.0–34.0)
MCH: 29.9 pg (ref 26.0–34.0)
MCHC: 30.3 g/dL (ref 30.0–36.0)
MCHC: 30.6 g/dL (ref 30.0–36.0)
MCV: 99.1 fL (ref 80.0–100.0)
MCV: 97.7 fL (ref 80.0–100.0)
Monocytes Absolute: 0.8 10*3/uL (ref 0.1–1.0)
Monocytes Absolute: 0.3 10*3/uL (ref 0.1–1.0)
Monocytes Relative: 4 %
Monocytes Relative: 1 %
Neutro Abs: 6.6 10*3/uL (ref 1.7–7.7)
Neutro Abs: 23.9 10*3/uL — ABNORMAL HIGH (ref 1.7–7.7)
Neutrophils Relative %: 38 %
Neutrophils Relative %: 82 %
Platelets: 266 10*3/uL (ref 150–400)
Platelets: 276 10*3/uL (ref 150–400)
RBC: 4.69 MIL/uL (ref 3.87–5.11)
RBC: 4.81 MIL/uL (ref 3.87–5.11)
RDW: 14.5 % (ref 11.5–15.5)
RDW: 14.6 % (ref 11.5–15.5)
WBC: 17.3 10*3/uL — ABNORMAL HIGH (ref 4.0–10.5)
WBC: 26.9 10*3/uL — ABNORMAL HIGH (ref 4.0–10.5)
nRBC: 0 % (ref 0.0–0.2)
nRBC: 0 % (ref 0.0–0.2)

## 2019-04-11 LAB — BLOOD GAS, ARTERIAL
Acid-base deficit: 4.5 mmol/L — ABNORMAL HIGH (ref 0.0–2.0)
Bicarbonate: 25.7 mmol/L (ref 20.0–28.0)
FIO2: 100
O2 Saturation: 90 %
Patient temperature: 37
pCO2 arterial: 107 mmHg (ref 32.0–48.0)
pH, Arterial: 7.01 — CL (ref 7.350–7.450)
pO2, Arterial: 90.5 mmHg (ref 83.0–108.0)

## 2019-04-11 LAB — URINALYSIS, ROUTINE W REFLEX MICROSCOPIC
Bacteria, UA: NONE SEEN
Bilirubin Urine: NEGATIVE
Glucose, UA: >=500 mg/dL — AB
Hgb urine dipstick: NEGATIVE
Ketones, ur: NEGATIVE mg/dL
Leukocytes,Ua: NEGATIVE
Nitrite: NEGATIVE
Protein, ur: >=300 mg/dL — AB
Specific Gravity, Urine: 1.008 (ref 1.005–1.030)
pH: 7 (ref 5.0–8.0)

## 2019-04-11 LAB — GLUCOSE, CAPILLARY
Glucose-Capillary: 129 mg/dL — ABNORMAL HIGH (ref 70–99)
Glucose-Capillary: 139 mg/dL — ABNORMAL HIGH (ref 70–99)
Glucose-Capillary: 147 mg/dL — ABNORMAL HIGH (ref 70–99)
Glucose-Capillary: 150 mg/dL — ABNORMAL HIGH (ref 70–99)

## 2019-04-11 LAB — CREATININE, SERUM
Creatinine, Ser: 1.05 mg/dL — ABNORMAL HIGH (ref 0.44–1.00)
GFR calc Af Amer: >60 mL/min (ref >60)
GFR calc non Af Amer: >60 mL/min (ref >60)

## 2019-04-11 LAB — COMPREHENSIVE METABOLIC PANEL
ALT: 46 U/L — ABNORMAL HIGH (ref 0–44)
AST: 82 U/L — ABNORMAL HIGH (ref 15–41)
Albumin: 3.6 g/dL (ref 3.5–5.0)
Alkaline Phosphatase: 172 U/L — ABNORMAL HIGH (ref 38–126)
Anion gap: 14 (ref 5–15)
BUN: 6 mg/dL (ref 6–20)
CO2: 24 mmol/L (ref 22–32)
Calcium: 9.3 mg/dL (ref 8.9–10.3)
Chloride: 103 mmol/L (ref 98–111)
Creatinine, Ser: 1.19 mg/dL — ABNORMAL HIGH (ref 0.44–1.00)
GFR calc Af Amer: >60 mL/min (ref >60)
GFR calc non Af Amer: 54 mL/min — ABNORMAL LOW (ref >60)
Glucose, Bld: 383 mg/dL — ABNORMAL HIGH (ref 70–99)
Potassium: 4.2 mmol/L (ref 3.5–5.1)
Sodium: 141 mmol/L (ref 135–145)
Total Bilirubin: 0.6 mg/dL (ref 0.3–1.2)
Total Protein: 6.5 g/dL (ref 6.5–8.1)

## 2019-04-11 LAB — TROPONIN I (HIGH SENSITIVITY)
Troponin I (High Sensitivity): 26 ng/L — ABNORMAL HIGH (ref <18)
Troponin I (High Sensitivity): 50 ng/L — ABNORMAL HIGH (ref <18)

## 2019-04-11 LAB — MRSA PCR SCREENING: MRSA by PCR: NEGATIVE

## 2019-04-11 LAB — TRIGLYCERIDES: Triglycerides: 380 mg/dL — ABNORMAL HIGH (ref <150)

## 2019-04-11 LAB — MAGNESIUM
Magnesium: 2.3 mg/dL (ref 1.7–2.4)
Magnesium: 2.3 mg/dL (ref 1.7–2.4)
Magnesium: 1.7 mg/dL (ref 1.7–2.4)

## 2019-04-11 LAB — ECHOCARDIOGRAM COMPLETE
Height: 62 in
Weight: 2640 oz

## 2019-04-11 LAB — RESPIRATORY PANEL BY RT PCR (FLU A&B, COVID)
Influenza A by PCR: NEGATIVE
Influenza B by PCR: NEGATIVE
SARS Coronavirus 2 by RT PCR: NEGATIVE

## 2019-04-11 LAB — PHOSPHORUS
Phosphorus: 8 mg/dL — ABNORMAL HIGH (ref 2.5–4.6)
Phosphorus: 2.8 mg/dL (ref 2.5–4.6)

## 2019-04-11 LAB — HEMOGLOBIN A1C
Hgb A1c MFr Bld: 5.5 % (ref 4.8–5.6)
Mean Plasma Glucose: 111.15 mg/dL

## 2019-04-11 LAB — HIV ANTIBODY (ROUTINE TESTING W REFLEX): HIV Screen 4th Generation wRfx: NONREACTIVE

## 2019-04-11 LAB — PROCALCITONIN: Procalcitonin: 1.22 ng/mL

## 2019-04-11 LAB — BRAIN NATRIURETIC PEPTIDE: B Natriuretic Peptide: 348.6 pg/mL — ABNORMAL HIGH (ref 0.0–100.0)

## 2019-04-11 LAB — STREP PNEUMONIAE URINARY ANTIGEN: Strep Pneumo Urinary Antigen: NEGATIVE

## 2019-04-11 MED ORDER — ASPIRIN 81 MG PO CHEW
81.0000 mg | CHEWABLE_TABLET | Freq: Every day | ORAL | Status: DC
  Administered 2019-04-11 – 2019-04-16 (×6): 81 mg
  Filled 2019-04-11 (×6): qty 1

## 2019-04-11 MED ORDER — ETOMIDATE 2 MG/ML IV SOLN
INTRAVENOUS | Status: AC | PRN
  Administered 2019-04-11: 20 mg via INTRAVENOUS

## 2019-04-11 MED ORDER — METOPROLOL TARTRATE 25 MG/10 ML ORAL SUSPENSION
50.0000 mg | Freq: Two times a day (BID) | ORAL | Status: DC
  Administered 2019-04-11 – 2019-04-12 (×2): 50 mg
  Filled 2019-04-11 (×2): qty 20

## 2019-04-11 MED ORDER — PROPOFOL 1000 MG/100ML IV EMUL: 0.0000 ug/kg/min | INTRAVENOUS | Status: DC

## 2019-04-11 MED ORDER — METHYLPREDNISOLONE SODIUM SUCC 125 MG IJ SOLR
60.0000 mg | Freq: Three times a day (TID) | INTRAMUSCULAR | Status: DC
  Administered 2019-04-11 – 2019-04-13 (×6): 60 mg via INTRAVENOUS
  Filled 2019-04-11 (×6): qty 2

## 2019-04-11 MED ORDER — NITROGLYCERIN 0.4 MG SL SUBL
0.4000 mg | SUBLINGUAL_TABLET | SUBLINGUAL | Status: DC | PRN
  Administered 2019-04-11: 0.4 mg via SUBLINGUAL

## 2019-04-11 MED ORDER — ORAL CARE MOUTH RINSE
15.0000 mL | OROMUCOSAL | Status: DC
  Administered 2019-04-11 – 2019-04-13 (×17): 15 mL via OROMUCOSAL

## 2019-04-11 MED ORDER — FENTANYL CITRATE (PF) 100 MCG/2ML IJ SOLN
100.0000 ug | INTRAMUSCULAR | Status: DC | PRN
  Administered 2019-04-11: 100 ug via INTRAVENOUS
  Filled 2019-04-11: qty 2

## 2019-04-11 MED ORDER — PROPOFOL 1000 MG/100ML IV EMUL
INTRAVENOUS | Status: AC
  Filled 2019-04-11: qty 100

## 2019-04-11 MED ORDER — VITAL HIGH PROTEIN PO LIQD
1000.0000 mL | ORAL | Status: DC
  Administered 2019-04-11 – 2019-04-12 (×2): 1000 mL

## 2019-04-11 MED ORDER — ACETAMINOPHEN 325 MG PO TABS
650.0000 mg | ORAL_TABLET | Freq: Four times a day (QID) | ORAL | Status: DC | PRN
  Administered 2019-04-12: 22:00:00 650 mg via ORAL
  Filled 2019-04-11: qty 2

## 2019-04-11 MED ORDER — ACETAMINOPHEN 160 MG/5ML PO SOLN
650.0000 mg | Freq: Four times a day (QID) | ORAL | Status: DC | PRN
  Administered 2019-04-11: 650 mg
  Filled 2019-04-11: qty 20.3

## 2019-04-11 MED ORDER — NITROGLYCERIN 0.4 MG SL SUBL
SUBLINGUAL_TABLET | SUBLINGUAL | Status: AC
  Filled 2019-04-11: qty 1

## 2019-04-11 MED ORDER — SODIUM CHLORIDE 0.9 % IV SOLN
2.0000 g | INTRAVENOUS | Status: DC
  Administered 2019-04-12 – 2019-04-13 (×2): 2 g via INTRAVENOUS
  Filled 2019-04-11 (×2): qty 2

## 2019-04-11 MED ORDER — FENTANYL 2500MCG IN NS 250ML (10MCG/ML) PREMIX INFUSION
50.0000 ug/h | INTRAVENOUS | Status: DC
  Administered 2019-04-11: 10:00:00 50 ug/h via INTRAVENOUS
  Filled 2019-04-11: qty 250

## 2019-04-11 MED ORDER — DIAZEPAM 5 MG/ML IJ SOLN
1.0000 mg | Freq: Once | INTRAMUSCULAR | Status: AC
  Administered 2019-04-11: 1 mg via INTRAVENOUS

## 2019-04-11 MED ORDER — FENTANYL CITRATE (PF) 100 MCG/2ML IJ SOLN
100.0000 ug | INTRAMUSCULAR | Status: AC | PRN
  Administered 2019-04-11 – 2019-04-12 (×3): 100 ug via INTRAVENOUS
  Filled 2019-04-11: qty 2

## 2019-04-11 MED ORDER — LORATADINE 10 MG PO TABS: 10.0000 mg | ORAL_TABLET | Freq: Every day | ORAL | Status: DC

## 2019-04-11 MED ORDER — BUDESONIDE 0.5 MG/2ML IN SUSP
0.5000 mg | Freq: Two times a day (BID) | RESPIRATORY_TRACT | Status: DC
  Administered 2019-04-11 – 2019-04-13 (×4): 0.5 mg via RESPIRATORY_TRACT
  Filled 2019-04-11 (×5): qty 2

## 2019-04-11 MED ORDER — FENTANYL CITRATE (PF) 100 MCG/2ML IJ SOLN
INTRAMUSCULAR | Status: AC
  Filled 2019-04-11: qty 2

## 2019-04-11 MED ORDER — FUROSEMIDE 10 MG/ML IJ SOLN
40.0000 mg | Freq: Three times a day (TID) | INTRAMUSCULAR | Status: DC
  Filled 2019-04-11: qty 4

## 2019-04-11 MED ORDER — ANASTROZOLE 1 MG PO TABS
1.0000 mg | ORAL_TABLET | Freq: Every day | ORAL | Status: DC
  Administered 2019-04-11 – 2019-04-14 (×4): 1 mg
  Filled 2019-04-11 (×5): qty 1

## 2019-04-11 MED ORDER — METHYLPREDNISOLONE SODIUM SUCC 125 MG IJ SOLR
125.0000 mg | Freq: Once | INTRAMUSCULAR | Status: AC
  Administered 2019-04-11: 07:00:00 125 mg via INTRAVENOUS
  Filled 2019-04-11: qty 2

## 2019-04-11 MED ORDER — INSULIN ASPART 100 UNIT/ML ~~LOC~~ SOLN
0.0000 [IU] | SUBCUTANEOUS | Status: DC
  Administered 2019-04-11 – 2019-04-14 (×14): 3 [IU] via SUBCUTANEOUS
  Administered 2019-04-15: 4 [IU] via SUBCUTANEOUS
  Administered 2019-04-15: 12:00:00 3 [IU] via SUBCUTANEOUS

## 2019-04-11 MED ORDER — NITROGLYCERIN IN D5W 200-5 MCG/ML-% IV SOLN
0.0000 ug/min | INTRAVENOUS | Status: DC
  Administered 2019-04-11: 50 ug/min via INTRAVENOUS

## 2019-04-11 MED ORDER — LORATADINE 10 MG PO TABS
10.0000 mg | ORAL_TABLET | Freq: Every day | ORAL | Status: DC
  Administered 2019-04-11 – 2019-04-14 (×4): 10 mg
  Filled 2019-04-11 (×5): qty 1

## 2019-04-11 MED ORDER — ATORVASTATIN CALCIUM 80 MG PO TABS: 80.0000 mg | ORAL_TABLET | Freq: Every day | ORAL | Status: DC

## 2019-04-11 MED ORDER — FAMOTIDINE IN NACL 20-0.9 MG/50ML-% IV SOLN: 20.0000 mg | Freq: Every day | INTRAVENOUS | Status: DC

## 2019-04-11 MED ORDER — DIAZEPAM 5 MG/ML IJ SOLN
INTRAMUSCULAR | Status: AC
  Filled 2019-04-11: qty 2

## 2019-04-11 MED ORDER — PROPOFOL 1000 MG/100ML IV EMUL
5.0000 ug/kg/min | INTRAVENOUS | Status: DC
  Administered 2019-04-11: 80 ug/kg/min via INTRAVENOUS
  Administered 2019-04-11: 14:00:00 40 ug/kg/min via INTRAVENOUS
  Administered 2019-04-11: 25 ug/kg/min via INTRAVENOUS
  Administered 2019-04-11: 23:00:00 50 ug/kg/min via INTRAVENOUS
  Administered 2019-04-11: 40 ug/kg/min via INTRAVENOUS
  Administered 2019-04-12 (×2): 50 ug/kg/min via INTRAVENOUS
  Filled 2019-04-11 (×2): qty 100
  Filled 2019-04-11 (×2): qty 200
  Filled 2019-04-11: qty 100

## 2019-04-11 MED ORDER — METOPROLOL TARTRATE 50 MG PO TABS: 50.0000 mg | ORAL_TABLET | Freq: Two times a day (BID) | ORAL | Status: DC

## 2019-04-11 MED ORDER — PANTOPRAZOLE SODIUM 40 MG IV SOLR: 40.0000 mg | Freq: Every day | INTRAVENOUS | Status: DC

## 2019-04-11 MED ORDER — HEPARIN SODIUM (PORCINE) 5000 UNIT/ML IJ SOLN
5000.0000 [IU] | Freq: Three times a day (TID) | INTRAMUSCULAR | Status: DC
  Administered 2019-04-11 – 2019-04-16 (×13): 5000 [IU] via SUBCUTANEOUS
  Filled 2019-04-11 (×14): qty 1

## 2019-04-11 MED ORDER — NOREPINEPHRINE 4 MG/250ML-% IV SOLN
0.0000 ug/min | INTRAVENOUS | Status: DC
  Administered 2019-04-11: 2 ug/min via INTRAVENOUS

## 2019-04-11 MED ORDER — ALBUTEROL (5 MG/ML) CONTINUOUS INHALATION SOLN
INHALATION_SOLUTION | RESPIRATORY_TRACT | Status: AC
  Administered 2019-04-11: 20 mg via RESPIRATORY_TRACT
  Filled 2019-04-11: qty 20

## 2019-04-11 MED ORDER — CHLORHEXIDINE GLUCONATE CLOTH 2 % EX PADS
6.0000 | MEDICATED_PAD | Freq: Every day | CUTANEOUS | Status: DC
  Administered 2019-04-11 – 2019-04-13 (×3): 6 via TOPICAL

## 2019-04-11 MED ORDER — ROCURONIUM BROMIDE 50 MG/5ML IV SOLN
INTRAVENOUS | Status: AC | PRN
  Administered 2019-04-11: 80 mg via INTRAVENOUS

## 2019-04-11 MED ORDER — PERFLUTREN LIPID MICROSPHERE
1.0000 mL | INTRAVENOUS | Status: AC | PRN
  Administered 2019-04-11: 3 mL via INTRAVENOUS
  Filled 2019-04-11: qty 10

## 2019-04-11 MED ORDER — CHLORHEXIDINE GLUCONATE 0.12% ORAL RINSE (MEDLINE KIT)
15.0000 mL | Freq: Two times a day (BID) | OROMUCOSAL | Status: DC
  Administered 2019-04-11 – 2019-04-12 (×4): 15 mL via OROMUCOSAL

## 2019-04-11 MED ORDER — SODIUM CHLORIDE 0.9 % IV SOLN
1.0000 g | INTRAVENOUS | Status: DC
  Filled 2019-04-11: qty 10

## 2019-04-11 MED ORDER — FENTANYL BOLUS VIA INFUSION
50.0000 ug | INTRAVENOUS | Status: DC | PRN
  Administered 2019-04-11: 10:00:00 50 ug via INTRAVENOUS
  Filled 2019-04-11: qty 50

## 2019-04-11 MED ORDER — PRO-STAT SUGAR FREE PO LIQD
30.0000 mL | Freq: Two times a day (BID) | ORAL | Status: DC
  Administered 2019-04-11 – 2019-04-12 (×3): 30 mL
  Filled 2019-04-11 (×3): qty 30

## 2019-04-11 MED ORDER — ANASTROZOLE 1 MG PO TABS
1.0000 mg | ORAL_TABLET | Freq: Every day | ORAL | Status: DC
  Filled 2019-04-11: qty 1

## 2019-04-11 MED ORDER — NOREPINEPHRINE 4 MG/250ML-% IV SOLN
INTRAVENOUS | Status: AC
  Filled 2019-04-11: qty 250

## 2019-04-11 MED ORDER — ALBUTEROL (5 MG/ML) CONTINUOUS INHALATION SOLN: 10.0000 mg/h | INHALATION_SOLUTION | RESPIRATORY_TRACT | Status: DC

## 2019-04-11 MED ORDER — FAMOTIDINE 40 MG/5ML PO SUSR
20.0000 mg | Freq: Every day | ORAL | Status: DC
  Administered 2019-04-11 – 2019-04-14 (×4): 20 mg
  Filled 2019-04-11 (×4): qty 2.5

## 2019-04-11 MED ORDER — ISOSORBIDE MONONITRATE ER 60 MG PO TB24: 60.0000 mg | ORAL_TABLET | Freq: Every day | ORAL | Status: DC

## 2019-04-11 MED ORDER — FENTANYL 2500MCG IN NS 250ML (10MCG/ML) PREMIX INFUSION: 0.0000 ug/h | INTRAVENOUS | Status: DC

## 2019-04-11 MED ORDER — SODIUM CHLORIDE 0.9 % IV SOLN
500.0000 mg | Freq: Every day | INTRAVENOUS | Status: DC
  Administered 2019-04-11 – 2019-04-13 (×3): 500 mg via INTRAVENOUS
  Filled 2019-04-11 (×3): qty 500

## 2019-04-11 MED ORDER — ATORVASTATIN CALCIUM 80 MG PO TABS
80.0000 mg | ORAL_TABLET | Freq: Every day | ORAL | Status: DC
  Administered 2019-04-11 – 2019-04-15 (×5): 80 mg
  Filled 2019-04-11 (×6): qty 1

## 2019-04-11 MED ORDER — IPRATROPIUM-ALBUTEROL 0.5-2.5 (3) MG/3ML IN SOLN
3.0000 mL | Freq: Four times a day (QID) | RESPIRATORY_TRACT | Status: DC
  Administered 2019-04-11 – 2019-04-12 (×6): 3 mL via RESPIRATORY_TRACT
  Filled 2019-04-11 (×6): qty 3

## 2019-04-11 MED ORDER — FENTANYL CITRATE (PF) 100 MCG/2ML IJ SOLN
50.0000 ug | Freq: Once | INTRAMUSCULAR | Status: DC
  Filled 2019-04-11: qty 2

## 2019-04-11 MED ORDER — ASPIRIN EC 81 MG PO TBEC: 81.0000 mg | DELAYED_RELEASE_TABLET | Freq: Every day | ORAL | Status: DC

## 2019-04-11 NOTE — ED Triage Notes (Signed)
Patient with resp distress.  Acute onset per EMS.  Patient is tachypneic, diaphoretic.  One word sentences.  Patient with history of asthma.

## 2019-04-11 NOTE — Progress Notes (Signed)
RT and RN transported vent patient from ED to 2H14. Vital signs stable through out. Unit RT given report.

## 2019-04-11 NOTE — Procedures (Signed)
Central Venous Catheter Insertion Procedure Note [name] [id number] 04/30/1970  Procedure: Insertion of Central Venous Catheter Indications: Drug and/or fluid administration  Procedure Details Consent: Risks of procedure as well as the alternatives and risks of each were explained to the (patient/caregiver).  Consent for procedure obtained. Time Out: Verified patient identification, verified procedure, site/side was marked, verified correct patient position, special equipment/implants available, medications/allergies/relevent history reviewed, required imaging and test results available.  Performed  Maximum sterile technique was used including antiseptics, cap, gloves, gown, hand hygiene, mask and sheet. Skin prep: Chlorhexidine; local anesthetic administered A antimicrobial bonded/coated triple lumen catheter was placed in the left internal jugular vein using the Seldinger technique.  Evaluation Blood flow good Complications: No apparent complications Patient did tolerate procedure well. Chest X-ray ordered to verify placement.  CXR: pending.  Lorene Dy 04/11/2019, 10:33 AM

## 2019-04-11 NOTE — ED Provider Notes (Addendum)
[facility] EMERGENCY DEPARTMENT Provider Note  CSN: [id number] Arrival date & time: 04/11/19 0458  Chief Complaint(s) Respiratory Distress  ED Triage Notes Munnett, William Hamburger, RN (Registered Nurse) . [facility] . Mar[facility] Kitchen 04/11/2019 5:05 AM . . Signed   Patient with resp distress.  Acute onset per EMS.  Patient is tachypneic, diaphoretic.  One word sentences.  Patient with history of asthma.        HPI [name] is a 49 y.o. female here for respiratory distress. Attempt to contact family not successful. Patient nonresponsive and unable to provide history.  Remainder of history, ROS, and physical exam limited due to patient's condition (acuity of condition). Additional information was obtained from EMS.   Level V Caveat.     HPI  Past Medical History Past Medical History:  Diagnosis Date  . Asthma    beginning of 2018 - came to ER    . Bell's palsy   . Breast cancer ([place]) 01/2017  . Cardiomegaly   . Coronary artery disease    NSTEMI 08/2015 (65% LAD; D1 50%)  . Family history of breast cancer   . Family history of prostate cancer   . 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 GERD (gastroesophageal reflux disease)   . Hypertension   . 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 Myocardial infarction ([place]) 2017  . Renal artery stenosis [facility])    s/p stent placment to left    Patient Active Problem List   Diagnosis Date Noted  . Respiratory failure ([place]) 04/11/2019  . Acute respiratory failure with hypercapnia ([place]) 04/11/2019  . Renal artery stenosis ([place])   . Hypertension   . GERD (gastroesophageal reflux disease)   . Genetic testing of female   . Coronary artery disease   . Cardiomegaly   . Bell's palsy   . Essential (primary) hypertension 12/24/2016  . Genetic testing 08/23/2016  . Family history of breast cancer   . Family history of prostate cancer   . Malignant neoplasm of overlapping sites of right breast in  female, estrogen receptor positive ([place]) 07/23/2016  . CAD at cath 09/18/15 09/28/2015  . Chest pain at rest 09/18/2015  . Hypertensive urgency 09/18/2015  . NSTEMI (non-ST elevated myocardial infarction) ([place]) 09/18/2015  . Hypokalemia 09/18/2015  . Asthma 09/18/2015  . History of lymphoma 09/18/2015  . Hypertensive emergency without congestive heart failure   . Left renal artery stenosis ([place]) 08/23/2014  . 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 Headache above the eye region 08/16/2014  . Blurry vision 08/16/2014  . Perceived hearing changes 08/16/2014  . Snoring 08/16/2014  . Witnessed apneic spells 08/16/2014  . Excessive daytime sleepiness 08/16/2014  . Nocturnal headaches 08/16/2014   Home Medication(s) Prior to Admission medications   Medication Sig Start Date End Date Taking? Authorizing Provider  acetaminophen (TYLENOL) 500 MG tablet Take 1,000 mg by mouth every 8 (eight) hours as needed for mild pain or headache.    [provider]  albuterol (PROVENTIL) (2.5 MG/3ML) 0.083% nebulizer solution Take 3 mLs (2.5 mg total) by nebulization every 6 (six) hours as needed for wheezing or shortness of breath. 04/12/16   Tanna Furry, MD  amLODipine (NORVASC) 10 MG tablet Take 10 mg by mouth daily.    [provider]  anastrozole (ARIMIDEX) 1 MG tablet Take 1 tablet (1 mg total) by mouth daily. 10/12/18   Nicholas Lose, MD  aspirin 81  MG chewable tablet Chew 81 mg by mouth daily.    [provider]  atorvastatin (LIPITOR) 80 MG tablet Take 1 tablet (80 mg total) by mouth daily at 6 PM. 09/20/15   Florencia Reasons, MD  cetirizine (ZYRTEC) 10 MG tablet Take 10 mg by mouth every evening. 09/29/17   [provider]  [employer] D3 5000 units capsule Take 5,000 Units by mouth daily. 09/29/17   [provider]  ipratropium (ATROVENT) 0.03 % nasal spray Place 2 sprays into both nostrils 2 (two) times daily as needed for rhinitis. unk 09/29/17   [provider]    isosorbide mononitrate (IMDUR) 60 MG 24 hr tablet Take 1 tablet (60 mg total) by mouth daily. 03/09/19 06/07/19  Troy Sine, MD  losartan (COZAAR) 50 MG tablet Take 50 mg by mouth daily. 06/28/15   [provider]  metoprolol succinate (TOPROL XL) 100 MG 24 hr tablet Take 1 tablet (100 mg total) by mouth daily. Take with or immediately following a meal. 09/20/15   Florencia Reasons, MD  mometasone-formoterol [facility]) 200-5 MCG/ACT AERO Inhale 2 puffs into the lungs 2 (two) times daily. 04/12/16   Tanna Furry, MD  nitroGLYCERIN (NITROSTAT) 0.4 MG SL tablet Place 0.4 mg under the tongue every 5 (five) minutes as needed for chest pain.    [provider]  PROAIR HFA 108 [phone] Base) MCG/ACT inhaler Inhale 2 puffs into the lungs every 6 (six) hours as needed for wheezing or shortness of breath.  11/01/16   [provider]  venlafaxine XR (EFFEXOR-XR) 75 MG 24 hr capsule Take 1 capsule (75 mg total) by mouth daily with breakfast. 10/12/18   Nicholas Lose, MD                                                                                                                                    Past Surgical History Past Surgical History:  Procedure Laterality Date  . AXILLARY LYMPH NODE DISSECTION Right 03/13/2017   Procedure: AXILLARY LYMPH NODE DISSECTION;  Surgeon: Erroll Luna, MD;  Location: [place];  Service: General;  Laterality: Right;  . BREAST RECONSTRUCTION WITH PLACEMENT OF TISSUE EXPANDER AND FLEX HD (ACELLULAR HYDRATED DERMIS) Right 01/30/2017   Procedure: RIGHT BREAST RECONSTRUCTION WITH PLACEMENT OF TISSUE EXPANDER AND FLEX HD (ACELLULAR HYDRATED DERMIS);  Surgeon: Wallace Going, DO;  Location: [place];  Service: Plastics;  Laterality: Right;  . BREAST SURGERY     breast bx  07/2016  . CARDIAC CATHETERIZATION N/A 09/18/2015   Procedure: Left Heart Cath and Coronary Angiography;  Surgeon: Leonie Man, MD;  Location: [place] [facility];  Service: Cardiovascular;  Laterality:  N/A;  . [place]   x2  . IR CV LINE INJECTION  07/24/2016  . MASTECTOMY WITH RADIOACTIVE SEED GUIDED EXCISION AND AXILLARY SENTINEL LYMPH NODE BIOPSY Right 01/30/2017   Procedure: RIGHT SIMPLE MASTECTOMY WITH  RADIOACTIVE SEED TARGETED RIGHT AXILLARY LYMPH NODE EXCISION AND RIGHT AXILLARY SENTINEL LYMPH NODE BIOPSY;  Surgeon: Erroll Luna, MD;  Location: [place];  Service: General;  Laterality: Right;  . PERIPHERAL VASCULAR CATHETERIZATION N/A 08/23/2014   Procedure: Renal Angiography;  Surgeon: Adrian Prows, MD;  Location: [place] [facility];  Service: Cardiovascular;  Laterality: N/A;  . RENAL ARTERY STENT Left 08/23/2014  . SIMPLE MASTECTOMY Right 01/30/2017  . SKIN BIOPSY  2000  . TISSUE EXPANDER PLACEMENT Right 10/08/2017   Procedure: REMOVAL OF RIGHT BREAST TISSUE EXPANDER;  Surgeon: Wallace Going, DO;  Location: [place];  Service: Plastics;  Laterality: Right;  Case should be 45 min  . TUBAL LIGATION     Family History Family History  Problem Relation Age of Onset  . Hypertension Mother   . Cervical cancer Mother        hysterectomy with ?metastatic disease  . Coronary artery disease Father 46       CABG  . Asthma Father   . Breast cancer Maternal Aunt        age at diagnosis unknown  . Prostate cancer Cousin        paternal first cousin  . Migraines Neg Hx     Social History Social History   Tobacco Use  . Smoking status: Current Every Day Smoker    Packs/day: 0.50    Years: 15.00    Pack years: 7.50    Types: Cigarettes  . Smokeless tobacco: Never Used  . Tobacco comment: 6 cig per day  Substance Use Topics  . Alcohol use: No    Alcohol/week: 0.0 standard drinks  . Drug use: No   Allergies Compazine [prochlorperazine] and Ondansetron hcl  Review of Systems Review of Systems  Unable to perform ROS: Acuity of condition    Physical Exam Vital Signs  I have reviewed the triage vital signs BP (!) 195/131   Pulse 100   Resp (!) 40    SpO2 92%   Physical Exam Vitals reviewed.  Constitutional:      General: She is in acute distress.     Appearance: She is well-developed. She is ill-appearing and diaphoretic.  HENT:     Head: Normocephalic and atraumatic.     Nose: Nose normal.  Eyes:     General: No scleral icterus.       Right eye: No discharge.        Left eye: No discharge.     Conjunctiva/sclera: Conjunctivae normal.     Pupils: Pupils are equal, round, and reactive to light.  Cardiovascular:     Rate and Rhythm: Regular rhythm. Tachycardia present.     Heart sounds: No murmur. No friction rub. No gallop.   Pulmonary:     Effort: Tachypnea, accessory muscle usage, prolonged expiration, respiratory distress and retractions present.     Breath sounds: No stridor. Examination of the right-middle field reveals rales. Examination of the left-middle field reveals rales. Examination of the right-lower field reveals rales. Examination of the left-lower field reveals rales. Rales present.  Chest:    Abdominal:     General: There is no distension.     Palpations: Abdomen is soft.     Tenderness: There is no abdominal tenderness.  Musculoskeletal:        General: No tenderness.     Cervical back: Normal range of motion and neck supple.     Right lower leg: No edema.     Left lower leg: No  edema.  Skin:    General: Skin is warm.     Findings: No erythema or rash.  Neurological:     Mental Status: She is lethargic.     ED Results and Treatments Labs (all labs ordered are listed, but only abnormal results are displayed) Labs Reviewed  CBC WITH DIFFERENTIAL/PLATELET - Abnormal; Notable for the following components:      Result Value   WBC 17.3 (*)    HCT 46.5 (*)    Lymphs Abs 9.2 (*)    Basophils Absolute 0.2 (*)    Abs Immature Granulocytes 0.36 (*)    All other components within normal limits  BRAIN NATRIURETIC PEPTIDE - Abnormal; Notable for the following components:   B Natriuretic Peptide 348.6 (*)     All other components within normal limits  COMPREHENSIVE METABOLIC PANEL - Abnormal; Notable for the following components:   Glucose, Bld 383 (*)    Creatinine, Ser 1.19 (*)    AST 82 (*)    ALT 46 (*)    Alkaline Phosphatase 172 (*)    GFR calc non Af Amer 54 (*)    All other components within normal limits  BLOOD GAS, ARTERIAL - Abnormal; Notable for the following components:   pH, Arterial 7.010 (*)    pCO2 arterial 107 (*)    Acid-base deficit 4.5 (*)    All other components within normal limits  URINALYSIS, ROUTINE W REFLEX MICROSCOPIC - Abnormal; Notable for the following components:   APPearance HAZY (*)    Glucose, UA >=500 (*)    Protein, ur >=300 (*)    All other components within normal limits  CBC WITH DIFFERENTIAL/PLATELET - Abnormal; Notable for the following components:   WBC 26.9 (*)    HCT 47.0 (*)    All other components within normal limits  POCT I-STAT 7, (LYTES, BLD GAS, ICA,H+H) - Abnormal; Notable for the following components:   pH, Arterial 7.082 (*)    pCO2 arterial 96.8 (*)    Bicarbonate 28.8 (*)    Acid-base deficit 4.0 (*)    Potassium 3.3 (*)    All other components within normal limits  TROPONIN I (HIGH SENSITIVITY) - Abnormal; Notable for the following components:   Troponin I (High Sensitivity) 26 (*)    All other components within normal limits  RESPIRATORY PANEL BY RT PCR (FLU A&B, COVID)  URINE CULTURE  CULTURE, RESPIRATORY  MAGNESIUM  HIV ANTIBODY (ROUTINE TESTING W REFLEX)  BLOOD GAS, ARTERIAL  PROCALCITONIN  STREP PNEUMONIAE URINARY ANTIGEN  LEGIONELLA PNEUMOPHILA SEROGP 1 UR AG  MAGNESIUM  MAGNESIUM  PHOSPHORUS  PHOSPHORUS  CREATININE, SERUM  HEMOGLOBIN A1C  TRIGLYCERIDES  BLOOD GAS, ARTERIAL  TROPONIN I (HIGH SENSITIVITY)                                                                                                                         EKG  EKG Interpretation  Date/Time:    Ventricular Rate:  PR Interval:      QRS Duration:   QT Interval:    QTC Calculation:   R Axis:     Text Interpretation:        Radiology DG Chest Port 1 View  Result Date: 04/11/2019 CLINICAL DATA:  Respiratory distress EXAM: PORTABLE CHEST 1 VIEW COMPARISON:  Radiograph same day FINDINGS: Insertion of endotracheal tube 4.3 cm from carina. NG tube extends into the stomach with side port below the GE junction. Central venous line again noted. Stable cardiac silhouette. Fine interstitial pattern throughout the lungs. Slight improvement aeration of the lungs. IMPRESSION: 1. Endotracheal tube in good position.  NG tube in good position. 2. Some improved aeration to lungs. 3. Diffuse interstitial pattern again noted Electronically Signed   By: Suzy Bouchard M.D.   On: 04/11/2019 06:33   DG Chest Portable 1 View  Result Date: 04/11/2019 CLINICAL DATA:  Asthma.  Short of breath. EXAM: PORTABLE CHEST 1 VIEW COMPARISON:  10/07/2017 FINDINGS: No change in central venous line. Enlarged cardiac silhouette. There is increased interstitial markings throughout the lungs as well as peripheral linear interstitial markings. Fine airspace disease in the lungs. Potential small effusions. IMPRESSION: Interstitial edema and pulmonary edema. Apparent increased interstitial lung disease compared to prior. Small effusions. Mild cardiomegaly Electronically Signed   By: Suzy Bouchard M.D.   On: 04/11/2019 05:55    Pertinent labs & imaging results that were available during my care of the patient were reviewed by me and considered in my medical decision making (see chart for details).  Medications Ordered in ED Medications  nitroGLYCERIN (NITROSTAT) 0.4 MG SL tablet (  Not Given 04/11/19 0527)  nitroGLYCERIN (NITROSTAT) SL tablet 0.4 mg (0.4 mg Sublingual Given 04/11/19 0515)  propofol (DIPRIVAN) 1000 MG/100ML infusion (50 mcg/kg/min  74.8 kg Intravenous Rate/Dose Change 04/11/19 0605)  fentaNYL (SUBLIMAZE) injection 100 mcg (has no administration  in time range)  fentaNYL (SUBLIMAZE) injection 100 mcg (100 mcg Intravenous Given 04/11/19 0613)  fentaNYL (SUBLIMAZE) 100 MCG/2ML injection (has no administration in time range)  heparin injection 5,000 Units (has no administration in time range)  pantoprazole (PROTONIX) injection 40 mg (has no administration in time range)  methylPREDNISolone sodium succinate (SOLU-MEDROL) 125 mg/2 mL injection 60 mg (has no administration in time range)  azithromycin (ZITHROMAX) 500 mg in sodium chloride 0.9 % 250 mL IVPB (has no administration in time range)  cefTRIAXone (ROCEPHIN) 1 g in sodium chloride 0.9 % 100 mL IVPB (has no administration in time range)  ipratropium-albuterol (DUONEB) 0.5-2.5 (3) MG/3ML nebulizer solution 3 mL (has no administration in time range)  budesonide (PULMICORT) nebulizer solution 0.5 mg (has no administration in time range)  insulin aspart (novoLOG) injection 0-20 Units (has no administration in time range)  feeding supplement (VITAL HIGH PROTEIN) liquid 1,000 mL (has no administration in time range)  feeding supplement (PRO-STAT SUGAR FREE 64) liquid 30 mL (has no administration in time range)  famotidine (PEPCID) IVPB 20 mg premix (has no administration in time range)  acetaminophen (TYLENOL) tablet 650 mg (has no administration in time range)  anastrozole (ARIMIDEX) tablet 1 mg (has no administration in time range)  aspirin EC tablet 81 mg (has no administration in time range)  atorvastatin (LIPITOR) tablet 80 mg (has no administration in time range)  loratadine (CLARITIN) tablet 10 mg (has no administration in time range)  isosorbide mononitrate (IMDUR) 24 hr tablet 60 mg (has no administration in time range)  metoprolol tartrate (LOPRESSOR) tablet 50 mg (has no administration  in time range)  fentaNYL (SUBLIMAZE) injection 50 mcg (has no administration in time range)  fentaNYL 2523mg in NS 2548m(1057mml) infusion-PREMIX (has no administration in time range)  fentaNYL  (SUBLIMAZE) bolus via infusion 50 mcg (has no administration in time range)  furosemide (LASIX) injection 40 mg (has no administration in time range)  diazepam (VALIUM) injection 1 mg (1 mg Intravenous Given 04/11/19 0527)  etomidate (AMIDATE) injection (20 mg Intravenous Given 04/11/19 0546)  rocuronium (ZEMURON) injection (80 mg Intravenous Given 04/11/19 0547)  methylPREDNISolone sodium succinate (SOLU-MEDROL) 125 mg/2 mL injection 125 mg (125 mg Intravenous Given 04/11/19 0636)                                                                                                                                    Procedures Procedure Name: Intubation Date/Time: 04/11/2019 8:07 AM Performed by: CarFatima BlankD Pre-anesthesia Checklist: Patient identified, Patient being monitored, Emergency Drugs available, Timeout performed and Suction available Oxygen Delivery Method: Non-rebreather mask Preoxygenation: Pre-oxygenation with 100% oxygen Induction Type: Rapid sequence Ventilation: Mask ventilation without difficulty Laryngoscope Size: Glidescope Grade View: Grade I Tube size: 7.5 mm Number of attempts: 1 Placement Confirmation: ETT inserted through vocal cords under direct vision,  CO2 detector and Breath sounds checked- equal and bilateral Secured at: 25 cm Tube secured with: ETT holder Difficulty Due To: Difficulty was anticipated    .Critical Care Performed by: CarFatima BlankD Authorized by: CarFatima BlankD    CRITICAL CARE Performed by: PedGrayce Sessionsrdama Total critical care time: 75 minutes Critical care time was exclusive of separately billable procedures and treating other patients. Critical care was necessary to treat or prevent imminent or life-threatening deterioration. Critical care was time spent personally by me on the following activities: development of treatment plan with patient and/or surrogate as well as nursing, discussions with  consultants, evaluation of patient's response to treatment, examination of patient, obtaining history from patient or surrogate, ordering and performing treatments and interventions, ordering and review of laboratory studies, ordering and review of radiographic studies, pulse oximetry and re-evaluation of patient's condition.    (including critical care time)  Medical Decision Making / ED Course I have reviewed the nursing notes for this encounter and the patient's prior records (if available in EHR or on provided paperwork).   PenZeriahlois MilStandishs evaluated in Emergency Department on 04/11/2019 for the symptoms described in the history of present illness. She was evaluated in the context of the global COVID-19 pandemic, which necessitated consideration that the patient might be at risk for infection with the SARS-CoV-2 virus that causes COVID-19. Institutional protocols and algorithms that pertain to the evaluation of patients at risk for COVID-19 are in a state of rapid change based on information released by regulatory bodies including the CDC and federal and state organizations. These policies and algorithms were followed during the patient's care in the ED.  Clinical Course as of Apr 10 806  Nancy Fetter Apr 11, 2019  0500 Presented as a respiratory distress.  History of asthma.  Decreased air movement throughout by EMS.  Patient was nonresponsive but combative.  Satting to the 80s on their arrival.  Given 0.3 of epinephrine and 1 g of mag.  She was placed on nonrebreather and transported here.   [PC]  [id number] Patient with mid and bibasilar Rales.  Bedside chest x-ray notable for cardiomegaly and likely pulmonary edema.  Patient is hypertensive.  This is concerning for f hypertensive emergency versus heart failure.  Sublingual nitro given.  Patient was immediately placed on BiPAP.  Patient was monitored closely without improvement in mental status.  Nitro drip was started given persistent  hypertension.  ABG obtained notable for respiratory acidosis with CO2 greater than 97.   Due to the lack of change in mental status, patient required intubation for airway protection.    [PC]    Clinical Course User Index [PC] Ronetta Molla, Grayce Sessions, MD    Admitted to ICU for further work up and management  Final Clinical Impression(s) / ED Diagnoses Final diagnoses:  Endotracheally intubated  Acute respiratory failure with hypoxia and hypercapnia [facility])  Hypertensive emergency      This chart was dictated using voice recognition software.  Despite best efforts to proofread,  errors can occur which can change the documentation meaning.     Fatima Blank, MD 04/11/19 [phone]

## 2019-04-11 NOTE — Procedures (Signed)
Arterial Catheter Insertion Procedure Note Viki Scurto [id number] 1970/07/22  Procedure: Insertion of Arterial Catheter  Indications: Blood pressure monitoring and Frequent blood sampling  Procedure Details Consent: Risks of procedure as well as the alternatives and risks of each were explained to the (patient/caregiver).  Consent for procedure obtained. Time Out: Verified patient identification, verified procedure, site/side was marked, verified correct patient position, special equipment/implants available, medications/allergies/relevent history reviewed, required imaging and test results available.  Performed  Maximum sterile technique was used including antiseptics, cap, gloves, gown, hand hygiene and mask. Skin prep: Chlorhexidine; local anesthetic administered 20 gauge catheter was inserted into right radial artery using the Seldinger technique. ULTRASOUND GUIDANCE USED: NO Evaluation Blood flow good; BP tracing good. Complications: No apparent complications   Aline placed at this time and secured per policy. MD obtained consent with central line. Attempts x2. Good bood flow BP 122/59   Saunders Glance 04/11/2019

## 2019-04-11 NOTE — Progress Notes (Signed)
Pt placed on BIPAP and 20mg  Continuous Albuterol treatment given. Pt unable to tolerate. Preparing to intubate.

## 2019-04-11 NOTE — ED Notes (Signed)
Patient remains restless and fighting against BIPAP.  MD aware and into room.  Preparing to intubate

## 2019-04-11 NOTE — ED Notes (Signed)
RT at bedside attempted bipap

## 2019-04-11 NOTE — H&P (Signed)
NAME:  [name], MRN:  [id number], DOB:  08/18/1970, LOS: 0 ADMISSION DATE:  04/11/2019, CONSULTATION DATE: 04/11/2019 REFERRING MD: ER, CHIEF COMPLAINT: Shortness of breath  Brief History   49 year old woman with a history of breast cancer in remission, COPD/ILD, CAD presenting with acute hypercarbic respiratory failure requiring intubation.  History of present illness   48 year old woman with a history of breast cancer in remission, asthma/COPD/ILD presenting with acute hypercarbic respiratory failure requiring intubation.  Apparently the onset was sudden.  The patient is intubated and heavily sedated so history is per chart review.  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  Past Medical History   Past Medical History:  Diagnosis Date  . Asthma    beginning of 2018 - came to ER    . Bell's palsy   . Breast cancer ([place]) 01/2017  . Cardiomegaly   . Coronary artery disease    NSTEMI 08/2015 (65% LAD; D1 50%)  . Family history of breast cancer   . Family history of prostate cancer   . 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 GERD (gastroesophageal reflux disease)   . Hypertension   . 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 Myocardial infarction ([place]) 2017  . Renal artery stenosis (HCC)    s/p stent placment to left      Significant Hospital Events   04/11/2019 admitted, intubated  Consults:  PCCM  Procedures:  Intubation  Significant Diagnostic Tests:  Chest x-ray with diffuse bilateral infiltrates on top of chronic lung disease  Micro Data:  Covid negative Flu negative  Antimicrobials:  Ceftriaxone 1/17 >> Azithromycin 1/17 >>  Interim history/subjective:  Admitted  Objective   Blood pressure (!) 138/92, pulse (!) 115, temperature (!) 100.5 F (38.1 C), resp. rate (!) 25, height '5\' 2"'  (1.575 m), weight 74.8 kg, SpO2 93 %.   Vent Mode: PRVC FiO2 (%):  [100 %] 100 % Set Rate:  [15 bmp-25 bmp] 25 bmp Vt Set:  [400 mL-430 mL] 400 mL PEEP:  [8 cmH20-10 cmH20] 10 cmH20 Plateau Pressure:  [17 cmH20] 17 cmH20  No intake or output data in the 24 hours ending 04/11/19 0716 Filed Weights   04/11/19 0550  Weight: 74.8 kg    Examination: GEN: Middle-aged woman on ventilator HEENT: Endotracheal tube in place minimal secretions, trachea midline CV: Heart sounds are tachycardic, extremities are warm, no loud murmurs PULM: Lung sounds are diminished bilaterally with expiratory wheezes worse on the right side, passive on ventilator GI: Abdomen is soft, hypoactive bowel sounds EXT: There is no edema NEURO: She seems to be still paralyzed from induction, she does not withdrawal to pain PSYCH: Intubated/sedated SKIN: No rashes, peripheral IVs in place   Resolved Hospital Problem list   N/A  Assessment & Plan:  # Acute hypercarbic and hypoxic respiratory failure with acute on chronic infiltrates on chest x-ray.  Working diagnosis here is acute exacerbation of asthma/COPD/ILD.  There could be a superimposed infectious component given her low-grade fevers and white count.  We will treat as community-acquired pneumonia as well as the above.  Finally, it is possible this is flash pulmonary edema although her cardiac history is not impressive for this. -Ceftriaxone, azithromycin, steroids, bronchodilators -Titrate PEEP and FiO2 per usual ARDS protocol -Post vent ABG -Lasix challenge won't hurt -VAP prevention measures -Check troponin,  BNP, echocardiogram -Check a.m. x-ray  # History of breast cancer and lymphoma-she has a history of CHOP therapy which raises the risk of cardiomyopathy although echo 4 years ago was benign, she has also received neoadjuvant Taxotere and Cytoxan completed in 2018, adjuvant radiation therapy completed in 2019, on chronic antiestrogen therapy with tamoxifen  # Abnormal CT-warrants outpatient  pulmonology follow-up  # Hx CAD- continue PTA ASA/statin, workup as above  # Hyperglycemia- in setting of acute illness and steroids, check A1c, insulin SSI to start  Best practice:  Diet: Start tube feeds Pain/Anxiety/Delirium protocol (if indicated): Propofol and fentanyl VAP protocol (if indicated): Ordered 1/17 DVT prophylaxis: Lovenox GI prophylaxis: Pepcid Glucose control: SSI Mobility: Bedrest Code Status: Full Family Communication: Called significant other and daughter, no answer Disposition: ICU  Labs   CBC: Recent Labs  Lab 04/11/19 0520  WBC 17.3*  NEUTROABS 6.6  HGB 14.1  HCT 46.5*  MCV 99.1  PLT 244    Basic Metabolic Panel: Recent Labs  Lab 04/11/19 0520  NA 141  K 4.2  CL 103  CO2 24  GLUCOSE 383*  BUN 6  CREATININE 1.19*  CALCIUM 9.3  MG 2.3   GFR: Estimated Creatinine Clearance: 55.4 mL/min (A) (by C-G formula based on SCr of 1.19 mg/dL (H)). Recent Labs  Lab 04/11/19 0520  WBC 17.3*    Liver Function Tests: Recent Labs  Lab 04/11/19 0520  AST 82*  ALT 46*  ALKPHOS 172*  BILITOT 0.6  PROT 6.5  ALBUMIN 3.6   No results for input(s): LIPASE, AMYLASE in the last 168 hours. No results for input(s): AMMONIA in the last 168 hours.  ABG    Component Value Date/Time   PHART 7.010 (LL) 04/11/2019 0536   PCO2ART 107 (HH) 04/11/2019 0536   PO2ART 90.5 04/11/2019 0536   HCO3 25.7 04/11/2019 0536   ACIDBASEDEF 4.5 (H) 04/11/2019 0536   O2SAT 90.0 04/11/2019 0536     Coagulation Profile: No results for input(s): INR, PROTIME in the last 168 hours.  Cardiac Enzymes: No results for input(s): CKTOTAL, CKMB, CKMBINDEX, TROPONINI in the last 168 hours.  HbA1C: No results found for: HGBA1C  CBG: No results for input(s): GLUCAP in the last 168 hours.  Review of Systems:   Cannot assess patient is intubated and sedated  Past Medical History  She,  has a past medical history of Asthma, Bell's palsy, Breast cancer ([place]) (01/2017),  Cardiomegaly, Coronary artery disease, Family history of breast cancer, Family history of prostate cancer, Genetic testing of female, GERD (gastroesophageal reflux disease), Hypertension, Lymphoma of lymph nodes of neck ([place]), Myocardial infarction ([place]) (2017), and Renal artery stenosis ([place]).   Surgical History    Past Surgical History:  Procedure Laterality Date  . AXILLARY LYMPH NODE DISSECTION Right 03/13/2017   Procedure: AXILLARY LYMPH NODE DISSECTION;  Surgeon: Erroll Luna, MD;  Location: [place];  Service: General;  Laterality: Right;  . BREAST RECONSTRUCTION WITH PLACEMENT OF TISSUE EXPANDER AND FLEX HD (ACELLULAR HYDRATED DERMIS) Right 01/30/2017   Procedure: RIGHT BREAST RECONSTRUCTION WITH PLACEMENT OF TISSUE EXPANDER AND FLEX HD (ACELLULAR HYDRATED DERMIS);  Surgeon: Wallace Going, DO;  Location: [place] [facility];  Service: Plastics;  Laterality: Right;  . BREAST SURGERY     breast bx  07/2016  . CARDIAC CATHETERIZATION N/A 09/18/2015   Procedure: Left Heart Cath and Coronary Angiography;  Surgeon: Leonie Man, MD;  Location: [place] [facility];  Service: Cardiovascular;  Laterality: N/A;  . CESAREAN SECTION  [place]   x2  . IR CV LINE INJECTION  07/24/2016  . MASTECTOMY WITH RADIOACTIVE SEED GUIDED EXCISION AND AXILLARY SENTINEL LYMPH NODE BIOPSY Right 01/30/2017   Procedure: RIGHT SIMPLE MASTECTOMY WITH  RADIOACTIVE SEED TARGETED RIGHT AXILLARY LYMPH NODE EXCISION AND RIGHT AXILLARY SENTINEL LYMPH NODE BIOPSY;  Surgeon: Erroll Luna, MD;  Location: [facility];  Service: General;  Laterality: Right;  . PERIPHERAL VASCULAR CATHETERIZATION N/A 08/23/2014   Procedure: Renal Angiography;  Surgeon: Adrian Prows, MD;  Location: [place];  Service: Cardiovascular;  Laterality: N/A;  . RENAL ARTERY STENT Left 08/23/2014  . SIMPLE MASTECTOMY Right 01/30/2017  . SKIN BIOPSY  2000  . TISSUE EXPANDER PLACEMENT Right 10/08/2017   Procedure: REMOVAL OF RIGHT BREAST TISSUE EXPANDER;   Surgeon: Wallace Going, DO;  Location: [facility];  Service: Plastics;  Laterality: Right;  Case should be 45 min  . TUBAL LIGATION       Social History   reports that she has been smoking cigarettes. She has a 7.50 pack-year smoking history. She has never used smokeless tobacco. She reports that she does not drink alcohol or use drugs.   Family History   Her family history includes Asthma in her father; Breast cancer in her maternal aunt; Cervical cancer in her mother; Coronary artery disease (age of onset: 58) in her father; Hypertension in her mother; Prostate cancer in her cousin. There is no history of Migraines.   Allergies Allergies  Allergen Reactions  . Compazine [Prochlorperazine] Anaphylaxis, Swelling and Other (See Comments)    Throat swelling  . Ondansetron Hcl Anaphylaxis, Swelling and Other (See Comments)    Throat swelling per patient     Home Medications  Prior to Admission medications   Medication Sig Start Date End Date Taking? Authorizing Provider  acetaminophen (TYLENOL) 500 MG tablet Take 1,000 mg by mouth every 8 (eight) hours as needed for mild pain or headache.    [provider]  albuterol (PROVENTIL) (2.5 MG/3ML) 0.083% nebulizer solution Take 3 mLs (2.5 mg total) by nebulization every 6 (six) hours as needed for wheezing or shortness of breath. 04/12/16   Tanna Furry, MD  amLODipine (NORVASC) 10 MG tablet Take 10 mg by mouth daily.    [provider]  anastrozole (ARIMIDEX) 1 MG tablet Take 1 tablet (1 mg total) by mouth daily. 10/12/18   Nicholas Lose, MD  aspirin 81 MG chewable tablet Chew 81 mg by mouth daily.    [provider]  atorvastatin (LIPITOR) 80 MG tablet Take 1 tablet (80 mg total) by mouth daily at 6 PM. 09/20/15   Florencia Reasons, MD  cetirizine (ZYRTEC) 10 MG tablet Take 10 mg by mouth every evening. 09/29/17   [provider]  [employer]S D3 5000 units capsule Take 5,000 Units by mouth daily. 09/29/17   [provider]  ipratropium (ATROVENT) 0.03 % nasal spray Place 2 sprays into both nostrils 2 (two) times daily as needed for rhinitis. unk 09/29/17   [provider]  isosorbide mononitrate (IMDUR) 60 MG 24 hr tablet Take 1 tablet (60 mg total) by mouth daily. 03/09/19 06/07/19  Troy Sine, MD  losartan (COZAAR) 50 MG tablet Take 50 mg by mouth daily. 06/28/15   [provider]  metoprolol succinate (TOPROL XL) 100 MG 24 hr tablet Take 1 tablet (100 mg total) by mouth daily. Take with or immediately following a meal. 09/20/15   Florencia Reasons, MD  mometasone-formoterol [facility]) 200-5 MCG/ACT  AERO Inhale 2 puffs into the lungs 2 (two) times daily. 04/12/16   Tanna Furry, MD  nitroGLYCERIN (NITROSTAT) 0.4 MG SL tablet Place 0.4 mg under the tongue every 5 (five) minutes as needed for chest pain.    [provider]  PROAIR HFA 108 [phone] Base) MCG/ACT inhaler Inhale 2 puffs into the lungs every 6 (six) hours as needed for wheezing or shortness of breath.  11/01/16   [provider]  venlafaxine XR (EFFEXOR-XR) 75 MG 24 hr capsule Take 1 capsule (75 mg total) by mouth daily with breakfast. 10/12/18   Nicholas Lose, MD     Critical care time: 45 minutes not including any separately billable procedures

## 2019-04-11 NOTE — Progress Notes (Signed)
Echocardiogram 2D Echocardiogram has been performed.  Oneal Deputy Evo Aderman 04/11/2019, 11:24 AM

## 2019-04-12 ENCOUNTER — Inpatient Hospital Stay (HOSPITAL_COMMUNITY): Payer: Medicaid Other

## 2019-04-12 DIAGNOSIS — J439 Emphysema, unspecified: Secondary | ICD-10-CM

## 2019-04-12 DIAGNOSIS — I5021 Acute systolic (congestive) heart failure: Secondary | ICD-10-CM

## 2019-04-12 LAB — URINE CULTURE: Culture: NO GROWTH

## 2019-04-12 LAB — POCT I-STAT 7, (LYTES, BLD GAS, ICA,H+H)
Acid-Base Excess: 2 mmol/L (ref 0.0–2.0)
Bicarbonate: 24.6 mmol/L (ref 20.0–28.0)
Bicarbonate: 23.7 mmol/L (ref 20.0–28.0)
Calcium, Ion: 1.31 mmol/L (ref 1.15–1.40)
Calcium, Ion: 1.27 mmol/L (ref 1.15–1.40)
HCT: 42 % (ref 36.0–46.0)
HCT: 41 % (ref 36.0–46.0)
Hemoglobin: 14.3 g/dL (ref 12.0–15.0)
Hemoglobin: 13.9 g/dL (ref 12.0–15.0)
O2 Saturation: 99 %
O2 Saturation: 97 %
Patient temperature: 100
Patient temperature: 38.1
Potassium: 4.3 mmol/L (ref 3.5–5.1)
Potassium: 4.3 mmol/L (ref 3.5–5.1)
Sodium: 142 mmol/L (ref 135–145)
Sodium: 143 mmol/L (ref 135–145)
TCO2: 26 mmol/L (ref 22–32)
TCO2: 25 mmol/L (ref 22–32)
pCO2 arterial: 39.3 mmHg (ref 32.0–48.0)
pCO2 arterial: 31.1 mmHg — ABNORMAL LOW (ref 32.0–48.0)
pH, Arterial: 7.408 (ref 7.350–7.450)
pH, Arterial: 7.493 — ABNORMAL HIGH (ref 7.350–7.450)
pO2, Arterial: 165 mmHg — ABNORMAL HIGH (ref 83.0–108.0)
pO2, Arterial: 83 mmHg (ref 83.0–108.0)

## 2019-04-12 LAB — PHOSPHORUS
Phosphorus: 2.1 mg/dL — ABNORMAL LOW (ref 2.5–4.6)
Phosphorus: 2.6 mg/dL (ref 2.5–4.6)

## 2019-04-12 LAB — BASIC METABOLIC PANEL
Anion gap: 9 (ref 5–15)
BUN: 20 mg/dL (ref 6–20)
CO2: 21 mmol/L — ABNORMAL LOW (ref 22–32)
Calcium: 9.3 mg/dL (ref 8.9–10.3)
Chloride: 110 mmol/L (ref 98–111)
Creatinine, Ser: 0.93 mg/dL (ref 0.44–1.00)
GFR calc Af Amer: >60 mL/min (ref >60)
GFR calc non Af Amer: >60 mL/min (ref >60)
Glucose, Bld: 140 mg/dL — ABNORMAL HIGH (ref 70–99)
Potassium: 4.1 mmol/L (ref 3.5–5.1)
Sodium: 140 mmol/L (ref 135–145)

## 2019-04-12 LAB — MAGNESIUM
Magnesium: 1.7 mg/dL (ref 1.7–2.4)
Magnesium: 2.3 mg/dL (ref 1.7–2.4)

## 2019-04-12 LAB — TROPONIN I (HIGH SENSITIVITY)
Troponin I (High Sensitivity): 21 ng/L — ABNORMAL HIGH (ref <18)
Troponin I (High Sensitivity): 26 ng/L — ABNORMAL HIGH (ref <18)

## 2019-04-12 LAB — GLUCOSE, CAPILLARY
Glucose-Capillary: 137 mg/dL — ABNORMAL HIGH (ref 70–99)
Glucose-Capillary: 142 mg/dL — ABNORMAL HIGH (ref 70–99)
Glucose-Capillary: 119 mg/dL — ABNORMAL HIGH (ref 70–99)
Glucose-Capillary: 144 mg/dL — ABNORMAL HIGH (ref 70–99)
Glucose-Capillary: 136 mg/dL — ABNORMAL HIGH (ref 70–99)
Glucose-Capillary: 131 mg/dL — ABNORMAL HIGH (ref 70–99)

## 2019-04-12 LAB — CBC
HCT: 38.6 % (ref 36.0–46.0)
Hemoglobin: 12.6 g/dL (ref 12.0–15.0)
MCH: 30.1 pg (ref 26.0–34.0)
MCHC: 32.6 g/dL (ref 30.0–36.0)
MCV: 92.3 fL (ref 80.0–100.0)
Platelets: 191 10*3/uL (ref 150–400)
RBC: 4.18 MIL/uL (ref 3.87–5.11)
RDW: 14.9 % (ref 11.5–15.5)
WBC: 15.6 10*3/uL — ABNORMAL HIGH (ref 4.0–10.5)
nRBC: 0 % (ref 0.0–0.2)

## 2019-04-12 LAB — LEGIONELLA PNEUMOPHILA SEROGP 1 UR AG: L. pneumophila Serogp 1 Ur Ag: NEGATIVE

## 2019-04-12 MED ORDER — IPRATROPIUM-ALBUTEROL 0.5-2.5 (3) MG/3ML IN SOLN
3.0000 mL | Freq: Four times a day (QID) | RESPIRATORY_TRACT | Status: DC
  Administered 2019-04-12 – 2019-04-13 (×3): 3 mL via RESPIRATORY_TRACT
  Filled 2019-04-12 (×3): qty 3

## 2019-04-12 MED ORDER — METOCLOPRAMIDE HCL 5 MG/ML IJ SOLN
5.0000 mg | Freq: Four times a day (QID) | INTRAMUSCULAR | Status: DC | PRN
  Administered 2019-04-12: 5 mg via INTRAVENOUS
  Filled 2019-04-12: qty 2

## 2019-04-12 MED ORDER — METOPROLOL TARTRATE 50 MG PO TABS
50.0000 mg | ORAL_TABLET | Freq: Two times a day (BID) | ORAL | Status: DC
  Administered 2019-04-13: 09:00:00 50 mg via ORAL
  Filled 2019-04-12: qty 1

## 2019-04-12 MED ORDER — MAGNESIUM SULFATE 2 GM/50ML IV SOLN
2.0000 g | Freq: Once | INTRAVENOUS | Status: AC
  Administered 2019-04-12: 09:00:00 2 g via INTRAVENOUS
  Filled 2019-04-12: qty 50

## 2019-04-12 MED ORDER — METOPROLOL TARTRATE 5 MG/5ML IV SOLN
5.0000 mg | Freq: Four times a day (QID) | INTRAVENOUS | Status: DC | PRN
  Administered 2019-04-12 – 2019-04-13 (×2): 5 mg via INTRAVENOUS
  Filled 2019-04-12 (×2): qty 5

## 2019-04-12 MED ORDER — HYDRALAZINE HCL 20 MG/ML IJ SOLN
INTRAMUSCULAR | Status: AC
  Filled 2019-04-12: qty 1

## 2019-04-12 MED ORDER — ALBUTEROL SULFATE (2.5 MG/3ML) 0.083% IN NEBU
INHALATION_SOLUTION | RESPIRATORY_TRACT | Status: AC
  Administered 2019-04-12: 2.5 mg via RESPIRATORY_TRACT
  Filled 2019-04-12: qty 3

## 2019-04-12 MED ORDER — ARFORMOTEROL TARTRATE 15 MCG/2ML IN NEBU
15.0000 ug | INHALATION_SOLUTION | Freq: Two times a day (BID) | RESPIRATORY_TRACT | Status: DC
  Administered 2019-04-12 – 2019-04-13 (×2): 15 ug via RESPIRATORY_TRACT
  Filled 2019-04-12 (×2): qty 2

## 2019-04-12 MED ORDER — METOPROLOL TARTRATE 25 MG PO TABS
25.0000 mg | ORAL_TABLET | Freq: Two times a day (BID) | ORAL | Status: DC
  Administered 2019-04-12: 18:00:00 25 mg via ORAL
  Filled 2019-04-12: qty 1

## 2019-04-12 MED ORDER — SODIUM PHOSPHATES 45 MMOLE/15ML IV SOLN
10.0000 mmol | Freq: Once | INTRAVENOUS | Status: AC
  Administered 2019-04-12: 15:00:00 10 mmol via INTRAVENOUS
  Filled 2019-04-12: qty 3.33

## 2019-04-12 MED ORDER — METOPROLOL TARTRATE 50 MG PO TABS: 50.0000 mg | ORAL_TABLET | Freq: Two times a day (BID) | ORAL | Status: DC

## 2019-04-12 MED ORDER — NITROGLYCERIN 2 % TD OINT
1.0000 [in_us] | TOPICAL_OINTMENT | Freq: Four times a day (QID) | TRANSDERMAL | Status: DC
  Administered 2019-04-12 – 2019-04-13 (×2): 1 [in_us] via TOPICAL
  Filled 2019-04-12: qty 30

## 2019-04-12 MED ORDER — HYDRALAZINE HCL 20 MG/ML IJ SOLN
10.0000 mg | Freq: Four times a day (QID) | INTRAMUSCULAR | Status: DC | PRN
  Administered 2019-04-12 – 2019-04-14 (×6): 10 mg via INTRAVENOUS
  Filled 2019-04-12 (×7): qty 1

## 2019-04-12 MED ORDER — FUROSEMIDE 10 MG/ML IJ SOLN
20.0000 mg | Freq: Once | INTRAMUSCULAR | Status: AC
  Administered 2019-04-12: 12:00:00 20 mg via INTRAVENOUS

## 2019-04-12 MED ORDER — ALBUTEROL SULFATE (2.5 MG/3ML) 0.083% IN NEBU: 2.5000 mg | INHALATION_SOLUTION | RESPIRATORY_TRACT | Status: DC | PRN

## 2019-04-12 MED ORDER — DEXMEDETOMIDINE HCL IN NACL 400 MCG/100ML IV SOLN
0.4000 ug/kg/h | INTRAVENOUS | Status: DC
  Administered 2019-04-12: 12:00:00 0.4 ug/kg/h via INTRAVENOUS
  Filled 2019-04-12: qty 100

## 2019-04-12 NOTE — Progress Notes (Signed)
NAME:  [name], MRN:  [id number], DOB:  April 21, 1970, LOS: 1 ADMISSION DATE:  04/11/2019, CONSULTATION DATE: 04/11/2019 REFERRING MD: ER, CHIEF COMPLAINT: Shortness of breath  Brief History   49 year old woman with a history of breast cancer in remission, COPD/ILD, CAD presenting with acute hypercarbic respiratory failure requiring intubation.  History of present illness   49 year old woman with a history of breast cancer in remission, asthma/COPD/ILD presenting with acute hypercarbic respiratory failure requiring intubation.  Apparently the onset was sudden.  The patient is intubated and heavily sedated so history is per chart review.  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  Past Medical History   Past Medical History:  Diagnosis Date  . Asthma    beginning of 2018 - came to ER    . Bell's palsy   . Breast cancer ([place]) 01/2017  . Cardiomegaly   . Coronary artery disease    NSTEMI 08/2015 (65% LAD; D1 50%)  . Family history of breast cancer   . Family history of prostate cancer   . 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 GERD (gastroesophageal reflux disease)   . Hypertension   . 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 Myocardial infarction ([place]) 2017  . Renal artery stenosis (HCC)    s/p stent placment to left      Significant Hospital Events   04/11/2019 admitted, intubated  Consults:  PCCM  Procedures:  Intubation  Significant Diagnostic Tests:  Chest x-ray with diffuse bilateral infiltrates on top of chronic lung disease  Micro Data:  Covid negative Flu negative  Antimicrobials:  Ceftriaxone 1/17 >> Azithromycin 1/17 >>  Interim history/subjective:  Admitted  Objective   Blood pressure 119/70, pulse 88, temperature 99.9 F (37.7 C), resp. rate (!) 28, height '5\' 2"'  (1.575 m), weight 74.8 kg, SpO2 97 %.    Vent Mode:  PRVC FiO2 (%):  [40 %-100 %] 40 % Set Rate:  [28 bmp] 28 bmp Vt Set:  [530 mL-620 mL] 620 mL PEEP:  [8 cmH20-10 cmH20] 8 cmH20 Plateau Pressure:  [21 cmH20-26 cmH20] 22 cmH20   Intake/Output Summary (Last 24 hours) at 04/12/2019 0160 Last data filed at 04/12/2019 0600 Gross per 24 hour  Intake 1025.73 ml  Output 225 ml  Net 800.73 ml   Filed Weights   04/11/19 0550  Weight: 74.8 kg    Examination: GEN: Middle-aged woman on ventilator HEENT: Endotracheal tube in place minimal secretions, trachea midline CV: Heart sounds are tachycardic, extremities are warm, no loud murmurs PULM: Lung sounds are diminished bilaterally with expiratory wheezes  GI: Abdomen is soft, hypoactive bowel sounds EXT: There is no edema NEURO: Opens eyes on exam, moves extremities. SKIN: No rashes, warm , dry    Resolved Hospital Problem list   N/A  Assessment & Plan:  # Acute hypercarbic and hypoxic respiratory failure 2/2 HTN emergency  AMS in ED, likely due to hypercarbia. SBP to 200's. BNP 348 - Echo on 1/17 showing reduced EF 25-30%, previous 45% in 2017.  Severe global hypokinesis of LV. Infiltrates improved this morning on xray.  -  Likely complicated by underlying asthma/COPD/ILD.  There could be a superimposed infectious component given her low-grade fevers and white count. Viral panel ( flu a&B and covid) negative. We will treat as community-acquired pneumonia as well as the above.   -  SBP goal < 160 , DBP < 110 -Ceftriaxone, azithromycin, steroids, bronchodilators -Titrate PEEP and FiO2 per usual ARDS protocol -VAP prevention measures -Check a.m. x-ray  #Demand Ischemia # Hx CAD - History of CAD, cath 09/18/15 with 65% LAD. - continue Asprin/Statin - hsTrop, 26 > 50, Trend troponin  - Consult Cardiology , patient will likely need heart cath at some point.    # History of breast cancer and lymphoma-she has a history of CHOP therapy which raises the risk of cardiomyopathy although echo 4  years ago was benign, she has also received neoadjuvant Taxotere and Cytoxan completed in 2018, adjuvant radiation therapy completed in 2019, on chronic antiestrogen therapy with tamoxifen  # Abnormal CT-warrants outpatient pulmonology follow-up  # Hyperglycemia- in setting of acute illness and steroids A1c 5.5 on admission -  SSI  Best practice:  Diet: Start tube feeds Pain/Anxiety/Delirium protocol (if indicated): Propofol and fentanyl VAP protocol (if indicated): Ordered 1/17 DVT prophylaxis: Lovenox GI prophylaxis: Pepcid Glucose control: SSI Mobility: Bedrest Code Status: Full Family Communication: Called significant other and daughter, no answer Disposition: ICU  Labs   CBC: Recent Labs  Lab 04/11/19 0520 04/11/19 0520 04/11/19 0739 04/11/19 0746 04/11/19 0849 04/11/19 1631 04/12/19 0414  WBC 17.3*  --   --  26.9*  --   --  15.6*  NEUTROABS 6.6  --   --  23.9*  --   --   --   HGB 14.1   < > 14.6 14.4 13.9 13.9 12.6  HCT 46.5*   < > 43.0 47.0* 41.0 41.0 38.6  MCV 99.1  --   --  97.7  --   --  92.3  PLT 266  --   --  276  --   --  191   < > = values in this interval not displayed.    Basic Metabolic Panel: Recent Labs  Lab 04/11/19 0520 04/11/19 0739 04/11/19 0746 04/11/19 0849 04/11/19 1631 04/11/19 1634 04/12/19 0414  NA 141 145  --  145 142  --  140  K 4.2 3.3*  --  3.4* 3.8  --  4.1  CL 103  --   --   --   --   --  110  CO2 24  --   --   --   --   --  21*  GLUCOSE 383*  --   --   --   --   --  140*  BUN 6  --   --   --   --   --  20  CREATININE 1.19*  --  1.05*  --   --   --  0.93  CALCIUM 9.3  --   --   --   --   --  9.3  MG 2.3  --  2.3  --   --  1.7 1.7  PHOS  --   --  8.0*  --   --  2.8 2.1*   GFR: Estimated Creatinine Clearance: 70.8 mL/min (by C-G formula based on SCr of 0.93 mg/dL). Recent Labs  Lab 04/11/19 0520 04/11/19 0746 04/12/19 0414  PROCALCITON  --  1.22  --   WBC 17.3* 26.9* 15.6*    Liver Function Tests: Recent Labs    Lab 04/11/19 0520  AST 82*  ALT 46*  ALKPHOS 172*  BILITOT 0.6  PROT 6.5  ALBUMIN 3.6   No results for input(s): LIPASE, AMYLASE in the last 168 hours. No results for input(s):  AMMONIA in the last 168 hours.  ABG    Component Value Date/Time   PHART 7.349 (L) 04/11/2019 1631   PCO2ART 38.1 04/11/2019 1631   PO2ART 140.0 (H) 04/11/2019 1631   HCO3 20.8 04/11/2019 1631   TCO2 22 04/11/2019 1631   ACIDBASEDEF 4.0 (H) 04/11/2019 1631   O2SAT 99.0 04/11/2019 1631     Coagulation Profile: No results for input(s): INR, PROTIME in the last 168 hours.  Cardiac Enzymes: No results for input(s): CKTOTAL, CKMB, CKMBINDEX, TROPONINI in the last 168 hours.  HbA1C: Hgb A1c MFr Bld  Date/Time Value Ref Range Status  04/11/2019 07:46 AM 5.5 4.8 - 5.6 % Final    Comment:    (NOTE) Pre diabetes:          5.7%-6.4% Diabetes:              >6.4% Glycemic control for   <7.0% adults with diabetes     CBG: Recent Labs  Lab 04/11/19 1102 04/11/19 1511 04/11/19 2006 04/11/19 2353 04/12/19 0409  GLUCAP 129* 147* 139* 150* 137*    Review of Systems:   Cannot assess patient is intubated and sedated  Past Medical History  She,  has a past medical history of Asthma, Bell's palsy, Breast cancer ([place]) (01/2017), Cardiomegaly, Coronary artery disease, Family history of breast cancer, Family history of prostate cancer, Genetic testing of female, GERD (gastroesophageal reflux disease), Hypertension, Lymphoma of lymph nodes of neck ([place]), Myocardial infarction ([place]) (2017), and Renal artery stenosis ([place]).   Surgical History    Past Surgical History:  Procedure Laterality Date  . AXILLARY LYMPH NODE DISSECTION Right 03/13/2017   Procedure: AXILLARY LYMPH NODE DISSECTION;  Surgeon: Erroll Luna, MD;  Location: [place];  Service: General;  Laterality: Right;  . BREAST RECONSTRUCTION WITH PLACEMENT OF TISSUE EXPANDER AND FLEX HD (ACELLULAR HYDRATED DERMIS) Right 01/30/2017   Procedure:  RIGHT BREAST RECONSTRUCTION WITH PLACEMENT OF TISSUE EXPANDER AND FLEX HD (ACELLULAR HYDRATED DERMIS);  Surgeon: Wallace Going, DO;  Location: [place];  Service: Plastics;  Laterality: Right;  . BREAST SURGERY     breast bx  07/2016  . CARDIAC CATHETERIZATION N/A 09/18/2015   Procedure: Left Heart Cath and Coronary Angiography;  Surgeon: Leonie Man, MD;  Location: [place] [facility];  Service: Cardiovascular;  Laterality: N/A;  . [place]   x2  . IR CV LINE INJECTION  07/24/2016  . MASTECTOMY WITH RADIOACTIVE SEED GUIDED EXCISION AND AXILLARY SENTINEL LYMPH NODE BIOPSY Right 01/30/2017   Procedure: RIGHT SIMPLE MASTECTOMY WITH  RADIOACTIVE SEED TARGETED RIGHT AXILLARY LYMPH NODE EXCISION AND RIGHT AXILLARY SENTINEL LYMPH NODE BIOPSY;  Surgeon: Erroll Luna, MD;  Location: [place];  Service: General;  Laterality: Right;  . PERIPHERAL VASCULAR CATHETERIZATION N/A 08/23/2014   Procedure: Renal Angiography;  Surgeon: Adrian Prows, MD;  Location: [place] [facility];  Service: Cardiovascular;  Laterality: N/A;  . RENAL ARTERY STENT Left 08/23/2014  . SIMPLE MASTECTOMY Right 01/30/2017  . SKIN BIOPSY  2000  . TISSUE EXPANDER PLACEMENT Right 10/08/2017   Procedure: REMOVAL OF RIGHT BREAST TISSUE EXPANDER;  Surgeon: Wallace Going, DO;  Location: [place];  Service: Plastics;  Laterality: Right;  Case should be 45 min  . TUBAL LIGATION       Social History   reports that she has been smoking cigarettes. She has a 7.50 pack-year smoking history. She has never used smokeless tobacco. She reports that she does not drink alcohol or  use drugs.   Family History   Her family history includes Asthma in her father; Breast cancer in her maternal aunt; Cervical cancer in her mother; Coronary artery disease (age of onset: 48) in her father; Hypertension in her mother; Prostate cancer in her cousin. There is no history of Migraines.   Allergies Allergies  Allergen Reactions  .  Compazine [Prochlorperazine] Anaphylaxis, Swelling and Other (See Comments)    Throat swelling  . Ondansetron Hcl Anaphylaxis, Swelling and Other (See Comments)    Throat swelling per patient     Home Medications  Prior to Admission medications   Medication Sig Start Date End Date Taking? Authorizing Provider  acetaminophen (TYLENOL) 500 MG tablet Take 1,000 mg by mouth every 8 (eight) hours as needed for mild pain or headache.    [provider]  albuterol (PROVENTIL) (2.5 MG/3ML) 0.083% nebulizer solution Take 3 mLs (2.5 mg total) by nebulization every 6 (six) hours as needed for wheezing or shortness of breath. 04/12/16   Tanna Furry, MD  amLODipine (NORVASC) 10 MG tablet Take 10 mg by mouth daily.    [provider]  anastrozole (ARIMIDEX) 1 MG tablet Take 1 tablet (1 mg total) by mouth daily. 10/12/18   Nicholas Lose, MD  aspirin 81 MG chewable tablet Chew 81 mg by mouth daily.    [provider]  atorvastatin (LIPITOR) 80 MG tablet Take 1 tablet (80 mg total) by mouth daily at 6 PM. 09/20/15   Florencia Reasons, MD  cetirizine (ZYRTEC) 10 MG tablet Take 10 mg by mouth every evening. 09/29/17   [provider]  CVS D3 5000 units capsule Take 5,000 Units by mouth daily. 09/29/17   [provider]  ipratropium (ATROVENT) 0.03 % nasal spray Place 2 sprays into both nostrils 2 (two) times daily as needed for rhinitis. unk 09/29/17   [provider]  isosorbide mononitrate (IMDUR) 60 MG 24 hr tablet Take 1 tablet (60 mg total) by mouth daily. 03/09/19 06/07/19  Troy Sine, MD  losartan (COZAAR) 50 MG tablet Take 50 mg by mouth daily. 06/28/15   [provider]  metoprolol succinate (TOPROL XL) 100 MG 24 hr tablet Take 1 tablet (100 mg total) by mouth daily. Take with or immediately following a meal. 09/20/15   Florencia Reasons, MD  mometasone-formoterol [facility]) 200-5 MCG/ACT AERO Inhale 2 puffs into the lungs 2 (two) times daily. 04/12/16   Tanna Furry, MD    nitroGLYCERIN (NITROSTAT) 0.4 MG SL tablet Place 0.4 mg under the tongue every 5 (five) minutes as needed for chest pain.    [provider]  PROAIR HFA 108 [phone] Base) MCG/ACT inhaler Inhale 2 puffs into the lungs every 6 (six) hours as needed for wheezing or shortness of breath.  11/01/16   [provider]  venlafaxine XR (EFFEXOR-XR) 75 MG 24 hr capsule Take 1 capsule (75 mg total) by mouth daily with breakfast. 10/12/18   Nicholas Lose, MD     Critical care time: 45 minutes not including any separately billable procedures

## 2019-04-12 NOTE — Progress Notes (Signed)
Wasted 10cc of fentanyl with Sandy Salaam

## 2019-04-12 NOTE — Progress Notes (Signed)
eLink Physician-Brief Progress Note Patient Name: [name] DOB: 05/23/1970 MRN: [id number]   Date of Service  04/12/2019  HPI/Events of Note  Vomited - Now SOB, wheezing, increased RR (RR = 31-38) and Hypertensive - BP = 170/84.  eICU Interventions  Will order: 1. Albuterol 2.5 mg via neb Q 3 hours PRN SOB or wheezing. 2. Portable CXR STAT. 3. Will ask ground team to evaluate at bedside.      Intervention Category Major Interventions: Other:  Lysle Dingwall 04/12/2019, 8:54 PM

## 2019-04-12 NOTE — Progress Notes (Signed)
Patient self extubated at 1333. MD Loanne Drilling notified. Patient is now on a NRB.

## 2019-04-12 NOTE — Consult Note (Addendum)
The patient has been seen in conjunction with Rosaria Ferries, PA-C. All aspects of care have been considered and discussed. The patient has been personally interviewed, examined, and all clinical data has been reviewed.   Acute hypercarbic respiratory failure requiring intubation.  Precordial chest pressure prior to presentation.  Prior history of right breast cancer treated with neoadjuvant Taxotere and Cytoxan 2018, radiation therapy, and antiestrogen therapy anastrozole.  Prior history of lymphoma treated with CHOP contains anthracycline.  Distant breath sounds.  No gallop is heard.  No significant JVD.  EKG with small inferior Q waves.  Echocardiogram with apical dyskinesis and otherwise generalized severe hypokinesis with EF of 20%.  This is new progression of systolic dysfunction when compared to 2017.  Prior non coronary chest CT demonstrated emphysema and advanced coronary calcification,  Troponin is minimally elevated and flat.  IMPRESSION: Progression of systolic left ventricular dysfunction.  Not likely the primary etiology of the patient's hypoxic respiratory therapy.  Could have been a contributor or possibly worsened by the severe metabolic and hemodynamic derangements.  There is background history of chemotherapy which may well be an underlying etiology of systolic dysfunction..  Has underlying coronary disease previously documented by angiography and severe three-vessel coronary calcification by non-coronary CT.    PLAN: Diuresis as clinically indicated; cycle cardiac markers; left and right heart cath with coronary angiography when more stable from pulmonary standpoint. Guideline directed therapy for systolic dysfunction.      Cardiology Consultation:   Patient ID: Leonardo Plaia; [id number]; 28-Nov-1970   Admit date: 04/11/2019 Date of Consult: 04/12/2019  Primary Care Provider: Javier Docker, MD Primary Cardiologist: Shelva Majestic, MD 04/08/2017 Primary  Electrophysiologist:  None   Patient Profile:   Donyae Kohn is a 49 y.o. female with a hx of renovascular HTN s/p L RA stent, NSTEMI 2017 w/ 65% LAD rx medically, HTN, breast CA, lymphoma of neck, GERD, Bell's palsy, COPD/ILD, who is being seen today for the evaluation of new CM w/ EF 25% and elevated troponin at the request of Dr Loanne Drilling.  History of Present Illness:   Ms. Oetken cath in 2017 was treated medically, the NSTEMI felt 2nd demand ischemia 2nd HTN urgency.   She was admitted 01/17 with hypercarbic resp failure requiring intubation. 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 COVID negative but concern for CAP and given Lasix for possible pulm edema.  Patient self extubated herself earlier today.  Since then, she has some increased work of breathing, but is maintaining her oxygen level.  Her heart rate has increased some but is still sinus tachycardia.  Echo performed 01/17 showed EF now 25%, +WMA w/ grade 2 diastolic dysfunction. Troponin was elevated at 50, recheck pending, cards asked to see.  Ms. Roseland feels the shortness of breath has been coming on for a couple of months.  She says prior to that, she could do housework without any difficulty.  Since about Thanksgiving, she has noticed increased dyspnea on exertion.  She denies lower extremity edema, but has had orthopnea and PND.  The symptoms gradually got worse to where she could do almost nothing around the house.  She did not have any chest pain.  Finally, her symptoms worsened to the point that she was severely short of breath at rest and that is when she came to the hospital.  She was placed on BiPAP and given a continuous albuterol breathing treatment, but it was not enough and she  had to be intubated.  She has never had anything like this before.   Past Medical History:  Diagnosis Date  . Asthma    beginning of 2018 - came to ER    . Bell's palsy   .  Breast cancer ([facility]) 01/2017  . Cardiomegaly   . Coronary artery disease    NSTEMI 08/2015 (65% LAD; D1 50%)  . Family history of breast cancer   . Family history of prostate cancer   . 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 GERD (gastroesophageal reflux disease)   . Hypertension   . 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 Myocardial infarction ([facility]) 2017  . Renal artery stenosis ([facility])    s/p stent placment to left     Past Surgical History:  Procedure Laterality Date  . AXILLARY LYMPH NODE DISSECTION Right 03/13/2017   Procedure: AXILLARY LYMPH NODE DISSECTION;  Surgeon: Erroll Luna, MD;  Location: [facility];  Service: General;  Laterality: Right;  . BREAST RECONSTRUCTION WITH PLACEMENT OF TISSUE EXPANDER AND FLEX HD (ACELLULAR HYDRATED DERMIS) Right 01/30/2017   Procedure: RIGHT BREAST RECONSTRUCTION WITH PLACEMENT OF TISSUE EXPANDER AND FLEX HD (ACELLULAR HYDRATED DERMIS);  Surgeon: Wallace Going, DO;  Location: [facility];  Service: Plastics;  Laterality: Right;  . BREAST SURGERY     breast bx  07/2016  . CARDIAC CATHETERIZATION N/A 09/18/2015   Procedure: Left Heart Cath and Coronary Angiography;  Surgeon: Leonie Man, MD;  Location: [facility];  Service: Cardiovascular;  Laterality: N/A;  . [place]   x2  . IR CV LINE INJECTION  07/24/2016  . MASTECTOMY WITH RADIOACTIVE SEED GUIDED EXCISION AND AXILLARY SENTINEL LYMPH NODE BIOPSY Right 01/30/2017   Procedure: RIGHT SIMPLE MASTECTOMY WITH  RADIOACTIVE SEED TARGETED RIGHT AXILLARY LYMPH NODE EXCISION AND RIGHT AXILLARY SENTINEL LYMPH NODE BIOPSY;  Surgeon: Erroll Luna, MD;  Location: [facility];  Service: General;  Laterality: Right;  . PERIPHERAL VASCULAR CATHETERIZATION N/A 08/23/2014   Procedure: Renal Angiography;  Surgeon: Adrian Prows, MD;  Location: [facility];  Service: Cardiovascular;  Laterality: N/A;  . RENAL ARTERY STENT Left 08/23/2014  . SIMPLE MASTECTOMY Right  01/30/2017  . SKIN BIOPSY  2000  . TISSUE EXPANDER PLACEMENT Right 10/08/2017   Procedure: REMOVAL OF RIGHT BREAST TISSUE EXPANDER;  Surgeon: Wallace Going, DO;  Location: [facility];  Service: Plastics;  Laterality: Right;  Case should be 45 min  . TUBAL LIGATION       Prior to Admission medications   Medication Sig Start Date End Date Taking? Authorizing Provider  acetaminophen (TYLENOL) 500 MG tablet Take 1,000 mg by mouth every 8 (eight) hours as needed for mild pain or headache.   Yes [provider]  albuterol (PROAIR HFA) 108 (90 Base) MCG/ACT inhaler Inhale 2 puffs into the lungs every 6 (six) hours as needed for wheezing or shortness of breath.   Yes [provider]  albuterol (PROVENTIL) (2.5 MG/3ML) 0.083% nebulizer solution Take 3 mLs (2.5 mg total) by nebulization every 6 (six) hours as needed for wheezing or shortness of breath. 04/12/16  Yes Tanna Furry, MD  anastrozole (ARIMIDEX) 1 MG tablet Take 1 tablet (1 mg total) by mouth daily. 10/12/18  Yes Nicholas Lose, MD  aspirin 81 MG chewable tablet Chew 81 mg by mouth daily.   Yes [provider]  cetirizine (ZYRTEC) 10 MG tablet Take  10 mg by mouth daily as needed for allergies.  09/29/17  Yes [provider]  [employer] D3 5000 units capsule Take 5,000 Units by mouth daily. 09/29/17  Yes [provider]  isosorbide mononitrate (IMDUR) 60 MG 24 hr tablet Take 1 tablet (60 mg total) by mouth daily. 03/09/19 06/07/19 Yes Troy Sine, MD  nitroGLYCERIN (NITROSTAT) 0.4 MG SL tablet Place 0.4 mg under the tongue every 5 (five) minutes as needed for chest pain.   Yes [provider]  venlafaxine XR (EFFEXOR-XR) 75 MG 24 hr capsule Take 1 capsule (75 mg total) by mouth daily with breakfast. 10/12/18  Yes Nicholas Lose, MD  atorvastatin (LIPITOR) 80 MG tablet Take 1 tablet (80 mg total) by mouth daily at 6 PM. Patient not taking: Reported on 04/11/2019 09/20/15   Florencia Reasons, MD  metoprolol succinate  (TOPROL XL) 100 MG 24 hr tablet Take 1 tablet (100 mg total) by mouth daily. Take with or immediately following a meal. Patient not taking: Reported on 04/11/2019 09/20/15   Florencia Reasons, MD  mometasone-formoterol [facility]) 200-5 MCG/ACT AERO Inhale 2 puffs into the lungs 2 (two) times daily. Patient not taking: Reported on 04/11/2019 04/12/16   Tanna Furry, MD    Inpatient Medications: Scheduled Meds: . anastrozole  1 mg Per Tube Daily  . arformoterol  15 mcg Nebulization BID  . aspirin  81 mg Per Tube Daily  . atorvastatin  80 mg Per Tube q1800  . budesonide (PULMICORT) nebulizer solution  0.5 mg Nebulization BID  . chlorhexidine gluconate (MEDLINE KIT)  15 mL Mouth Rinse BID  . Chlorhexidine Gluconate Cloth  6 each Topical Daily  . famotidine  20 mg Per Tube Daily  . feeding supplement (PRO-STAT SUGAR FREE 64)  30 mL Per Tube BID  . feeding supplement (VITAL HIGH PROTEIN)  1,000 mL Per Tube Q24H  . fentaNYL (SUBLIMAZE) injection  50 mcg Intravenous Once  . heparin  5,000 Units Subcutaneous Q8H  . insulin aspart  0-20 Units Subcutaneous Q4H  . ipratropium-albuterol  3 mL Nebulization Q6H  . loratadine  10 mg Per Tube Daily  . mouth rinse  15 mL Mouth Rinse 10 times per day  . methylPREDNISolone (SOLU-MEDROL) injection  60 mg Intravenous Q8H  . metoprolol tartrate  50 mg Per Tube BID   Continuous Infusions: . azithromycin Stopped (04/12/19 1026)  . cefTRIAXone (ROCEPHIN)  IV Stopped (04/12/19 0845)  . dexmedetomidine (PRECEDEX) IV infusion 0.4 mcg/kg/hr (04/12/19 1300)  . fentaNYL infusion INTRAVENOUS Stopped (04/11/19 1119)  . propofol (DIPRIVAN) infusion Stopped (04/12/19 1238)  . sodium phosphate  Dextrose 5% IVPB 10 mmol (04/12/19 1438)   PRN Meds: acetaminophen (TYLENOL) oral liquid 160 mg/5 mL, acetaminophen, fentaNYL, fentaNYL (SUBLIMAZE) injection, hydrALAZINE, nitroGLYCERIN  Allergies:    Allergies  Allergen Reactions  . Compazine [Prochlorperazine] Anaphylaxis, Swelling and  Other (See Comments)    Throat swelling  . Ondansetron Hcl Anaphylaxis, Swelling and Other (See Comments)    Throat swelling per patient    Social History:   Social History   Socioeconomic History  . Marital status: Single    Spouse name: Not on file  . Number of children: 4  . Years of education: 9  . Highest education level: Not on file  Occupational History  . Occupation: Unemployed  Tobacco Use  . Smoking status: Current Every Day Smoker    Packs/day: 0.50    Years: 15.00    Pack years: 7.50    Types: Cigarettes  .  Smokeless tobacco: Never Used  . Tobacco comment: 6 cig per day  Substance and Sexual Activity  . Alcohol use: No    Alcohol/week: 0.0 standard drinks  . Drug use: No  . Sexual activity: Yes    Birth control/protection: None  Other Topics Concern  . Not on file  Social History Narrative   Lives at home with fiance and kids   Caffeine use: 2 cups coffee per day   4 Dr. Malachi Bonds (12oz) per day    Social Determinants of Health   Financial Resource Strain:   . Difficulty of Paying Living Expenses: Not on file  Food Insecurity:   . Worried About Charity fundraiser in the Last Year: Not on file  . Ran Out of Food in the Last Year: Not on file  Transportation Needs:   . Lack of Transportation (Medical): Not on file  . Lack of Transportation (Non-Medical): Not on file  Physical Activity:   . Days of Exercise per Week: Not on file  . Minutes of Exercise per Session: Not on file  Stress:   . Feeling of Stress : Not on file  Social Connections:   . Frequency of Communication with Friends and Family: Not on file  . Frequency of Social Gatherings with Friends and Family: Not on file  . Attends Religious Services: Not on file  . Active Member of Clubs or Organizations: Not on file  . Attends Archivist Meetings: Not on file  . Marital Status: Not on file  Intimate Partner Violence:   . Fear of Current or Ex-Partner: Not on file  . Emotionally  Abused: Not on file  . Physically Abused: Not on file  . Sexually Abused: Not on file    Family History:   Family History  Problem Relation Age of Onset  . Hypertension Mother   . Cervical cancer Mother        hysterectomy with ?metastatic disease  . Coronary artery disease Father 48       CABG  . Asthma Father   . Breast cancer Maternal Aunt        age at diagnosis unknown  . Prostate cancer Cousin        paternal first cousin  . Migraines Neg Hx    Family Status:  Family Status  Relation Name Status  . Mother  Deceased  . Father  Deceased  . Mat Aunt  Deceased  . Sister x2 Alive  . Brother x3 Alive  . MGM  Deceased  . MGF  Deceased  . PGM  Deceased  . PGF  Deceased  . [place]  . Neg Hx  (Not Specified)    ROS:  Please see the history of present illness.  All other ROS reviewed and negative.     Physical Exam/Data:   Vitals:   04/12/19 1114 04/12/19 1300 04/12/19 1343 04/12/19 1400  BP: 123/63 (!) 147/97 (!) 176/99 129/60  Pulse: 89 (!) 117  (!) 125  Resp: (!) 28 18  (!) 29  Temp:  99.9 F (37.7 C)  100 F (37.8 C)  TempSrc:      SpO2: 97% 100%  98%  Weight:      Height:        Intake/Output Summary (Last 24 hours) at 04/12/2019 1442 Last data filed at 04/12/2019 1300 Gross per 24 hour  Intake 900.32 ml  Output 400 ml  Net 500.32 ml   Filed Weights   04/11/19 0550  Weight: 74.8 kg   Body mass index is 30.18 kg/m.  General:  Well nourished, well developed, female in no acute distress HEENT: normal Lymph: no adenopathy Neck: JVD -12 cm Endocrine:  No thryomegaly Vascular: No carotid bruits; 4/4 extremity pulses 2+  Cardiac:  normal S1, S2; RRR; no murmur Lungs: Coarse Rales, some rhonchi, and slight expiratory wheeze bilaterally, no wheezing Abd: soft, nontender, no hepatomegaly  Ext: no edema Musculoskeletal:  No deformities, BUE and BLE strength normal and equal Skin: warm and dry  Neuro:  CNs 2-12 intact, no focal  abnormalities noted Psych:  Normal affect   EKG:  The EKG was personally reviewed and demonstrates: Sinus tachycardia, heart rate 124, Q waves in inferior leads are slightly deeper than 08/2017 ECG Telemetry:  Telemetry was personally reviewed and demonstrates: Sinus tach   CV studies:   ECHO: 04/11/2019  1. Left ventricular ejection fraction, by visual estimation, is 25 to 30%. The left ventricle has severely decreased function. There is no left ventricular hypertrophy.  2. Mild dyskinesis of the left ventricular, entire apical segment.  3. Severe hypokinesis of the left ventricular, entire anteroseptal wall and anterior wall.  4. Severe hypokinesis of the left ventricular, entire inferolateral wall.  5. Elevated left atrial pressure.  6. Left ventricular diastolic parameters are consistent with Grade II diastolic dysfunction (pseudonormalization).  7. The left ventricle demonstrates global hypokinesis.  8. Global right ventricle has moderately reduced systolic function.The right ventricular size is normal. No increase in right ventricular wall thickness.  9. Left atrial size was mildly dilated. 10. Right atrial size was normal. 11. Trivial pericardial effusion is present. 12. The mitral valve is normal in structure. Mild mitral valve regurgitation. 13. The tricuspid valve is normal in structure. 14. The aortic valve is normal in structure. Aortic valve regurgitation is not visualized. 15. The pulmonic valve was not well visualized. Pulmonic valve regurgitation is not visualized. 16. Mildly elevated pulmonary artery systolic pressure. 17. The inferior vena cava is dilated in size with <50% respiratory variability, suggesting right atrial pressure of 15 mmHg. 18. The left ventricular function has critically worse. 19. No intracardiac thrombi or masses were visualized.  CATH: 09/18/2015 1. Mid LAD to Dist LAD lesion, 65% stenosed. This is in a tortuous segment with several hinge points.  Focal areas may be more significant, however the remainder the area looks relatively okay. 2. Mid LAD lesion, 45% stenosed. 3. Ost 1st Diag to 1st Diag lesion, 50% stenosed. 4. Known mild to moderately reduced EF by Echo today.   The patient had mild troponin elevation in the setting of hypertensive emergency/urgency and chest pain. Echo shows possible EF of roughly 45%. Final result pending.  I reviewed the images with Dr. Claiborne Billings, patient does have disease in the LAD, however this is a tortuous segment and there appears to be some spiraling of the vessel at this point. This would probably require relatively significant stent placement in the mid LAD. We discussed potential options, and Dr. Claiborne Billings felt it best to try to opt for medical management first.  Plan:  Return to nursing unit for continued medical management.  I will restart heparin 8 hours with sheath removal based on non-STEMI.  Continue to treat blood pressure and titrate antianginals.  Cardiovascular risk factor modification with statin and glycemic control.  If she fails medical management, would consider PCI of the distal targeting the LAD. Diagnostic Dominance: Right     Laboratory Data:   Chemistry Recent Labs  Lab  04/11/19 0520 04/11/19 0739 04/11/19 0746 04/11/19 0849 04/11/19 1631 04/12/19 0414 04/12/19 1439  NA 141   < >  --    < > 142 140 142  K 4.2   < >  --    < > 3.8 4.1 4.3  CL 103  --   --   --   --  110  --   CO2 24  --   --   --   --  21*  --   GLUCOSE 383*  --   --   --   --  140*  --   BUN 6  --   --   --   --  20  --   CREATININE 1.19*  --  1.05*  --   --  0.93  --   CALCIUM 9.3  --   --   --   --  9.3  --   GFRNONAA 54*  --  >60  --   --  >60  --   GFRAA >60  --  >60  --   --  >60  --   ANIONGAP 14  --   --   --   --  9  --    < > = values in this interval not displayed.    Magnesium  Date Value Ref Range Status  04/12/2019 1.7 1.7 - 2.4 mg/dL Final    Comment:    Performed at  [facility], [place] [address] [place], [place] [address]    Lab Results  Component Value Date   ALT 46 (H) 04/11/2019   AST 82 (H) 04/11/2019   ALKPHOS 172 (H) 04/11/2019   BILITOT 0.6 04/11/2019   Hematology Recent Labs  Lab 04/11/19 0520 04/11/19 0739 04/11/19 0746 04/11/19 0849 04/11/19 1631 04/12/19 0414 04/12/19 1439  WBC 17.3*  --  26.9*  --   --  15.6*  --   RBC 4.69  --  4.81  --   --  4.18  --   HGB 14.1   < > 14.4   < > 13.9 12.6 14.3  HCT 46.5*   < > 47.0*   < > 41.0 38.6 42.0  MCV 99.1  --  97.7  --   --  92.3  --   MCH 30.1  --  29.9  --   --  30.1  --   MCHC 30.3  --  30.6  --   --  32.6  --   RDW 14.5  --  14.6  --   --  14.9  --   PLT 266  --  276  --   --  191  --    < > = values in this interval not displayed.   Cardiac Enzymes High Sensitivity Troponin:   Recent Labs  Lab 04/11/19 0520 04/11/19 0746 04/12/19 0746  TROPONINIHS 26* 50* 21*      BNP Recent Labs  Lab 04/11/19 0520  BNP 348.6*    TSH:  Lab Results  Component Value Date   TSH 1.192 09/18/2015   Lipids: Lab Results  Component Value Date   CHOL 183 09/18/2015   HDL 47 09/18/2015   LDLCALC 111 (H) 09/18/2015   TRIG 380 (H) 04/11/2019   CHOLHDL 3.9 09/18/2015   HgbA1c: Lab Results  Component Value Date   HGBA1C 5.5 04/11/2019   Magnesium:  Magnesium  Date Value Ref Range Status  04/12/2019 1.7 1.7 - 2.4 mg/dL Final  Comment:    Performed at [facility], [place] [address] [place], [place] [address]     Radiology/Studies:  [facility] Chest Port 1 View  Result Date: 04/12/2019 CLINICAL DATA:  ARDS. EXAM: PORTABLE CHEST 1 VIEW COMPARISON:  Radiographs yesterday, chest CT 10/07/2017 FINDINGS: Endotracheal tube tip 4.2 cm from the carina. Enteric tube tip below the diaphragm. Left chest port remains in place. Left internal jugular central line remains in place. Decreasing interstitial opacities from prior exam. Improving cardiomegaly. No pneumothorax. No large  pleural effusion. No acute osseous abnormalities are seen. IMPRESSION: 1. Decreasing interstitial opacities from prior exam likely resolving pulmonary edema. Improving cardiomegaly. 2. Support apparatus unchanged. Electronically Signed   By: Keith Rake M.D.   On: 04/12/2019 05:47   DG CHEST PORT 1 VIEW  Result Date: 04/11/2019 CLINICAL DATA:  Respiratory distress. EXAM: PORTABLE CHEST 1 VIEW COMPARISON:  One-view chest x-ray 04/10/2018 at 6:09 a.m. FINDINGS: A tracheal tube is stable, 4.5 cm above the carina. Left subclavian port is in place. NG tube courses off the inferior border the film. A new left IJ line is place. Tip is in the distal SVC, just above the cavoatrial junction. Diffuse interstitial stable. IMPRESSION: 1. Interval placement of left IJ line without radiographic evidence for complication. 2. The remaining support apparatus is stable. 3. Stable diffuse interstitial pattern. Electronically Signed   By: San Morelle M.D.   On: 04/11/2019 10:59   DG Chest Port 1 View  Result Date: 04/11/2019 CLINICAL DATA:  Respiratory distress EXAM: PORTABLE CHEST 1 VIEW COMPARISON:  Radiograph same day FINDINGS: Insertion of endotracheal tube 4.3 cm from carina. NG tube extends into the stomach with side port below the GE junction. Central venous line again noted. Stable cardiac silhouette. Fine interstitial pattern throughout the lungs. Slight improvement aeration of the lungs. IMPRESSION: 1. Endotracheal tube in good position.  NG tube in good position. 2. Some improved aeration to lungs. 3. Diffuse interstitial pattern again noted Electronically Signed   By: Suzy Bouchard M.D.   On: 04/11/2019 06:33   DG Chest Portable 1 View  Result Date: 04/11/2019 CLINICAL DATA:  Asthma.  Short of breath. EXAM: PORTABLE CHEST 1 VIEW COMPARISON:  10/07/2017 FINDINGS: No change in central venous line. Enlarged cardiac silhouette. There is increased interstitial markings throughout the lungs as well as  peripheral linear interstitial markings. Fine airspace disease in the lungs. Potential small effusions. IMPRESSION: Interstitial edema and pulmonary edema. Apparent increased interstitial lung disease compared to prior. Small effusions. Mild cardiomegaly Electronically Signed   By: Suzy Bouchard M.D.   On: 04/11/2019 05:55   ECHOCARDIOGRAM COMPLETE  Result Date: 04/11/2019   ECHOCARDIOGRAM REPORT   Patient Name:   GRACIE GUPTA Date of Exam: 04/11/2019 Medical Rec #:  [id number]           Height:       62.0 in Accession #:    [id number]          Weight:       165.0 lb Date of Birth:  Jun 22, 1970           BSA:          1.76 m Patient Age:    8 years            BP:           147/73 mmHg Patient Gender: F                   HR:  87 bpm. Exam Location:  Inpatient Procedure: 2D Echo, Color Doppler, Cardiac Doppler and Intracardiac            Opacification Agent Indications:    CAD Native Vessel i25.10  History:        Patient has prior history of Echocardiogram examinations, most                 recent 09/18/2015. CAD; Risk Factors:Hypertension.  Sonographer:    Raquel Sarna Senior RDCS Referring Phys: [id number] Francesca Jewett  Sonographer Comments: Echo performed with patient supine and on artificial respirator. IMPRESSIONS  1. Left ventricular ejection fraction, by visual estimation, is 25 to 30%. The left ventricle has severely decreased function. There is no left ventricular hypertrophy.  2. Mild dyskinesis of the left ventricular, entire apical segment.  3. Severe hypokinesis of the left ventricular, entire anteroseptal wall and anterior wall.  4. Severe hypokinesis of the left ventricular, entire inferolateral wall.  5. Elevated left atrial pressure.  6. Left ventricular diastolic parameters are consistent with Grade II diastolic dysfunction (pseudonormalization).  7. The left ventricle demonstrates global hypokinesis.  8. Global right ventricle has moderately reduced systolic function.The right  ventricular size is normal. No increase in right ventricular wall thickness.  9. Left atrial size was mildly dilated. 10. Right atrial size was normal. 11. Trivial pericardial effusion is present. 12. The mitral valve is normal in structure. Mild mitral valve regurgitation. 13. The tricuspid valve is normal in structure. 14. The aortic valve is normal in structure. Aortic valve regurgitation is not visualized. 15. The pulmonic valve was not well visualized. Pulmonic valve regurgitation is not visualized. 16. Mildly elevated pulmonary artery systolic pressure. 17. The inferior vena cava is dilated in size with <50% respiratory variability, suggesting right atrial pressure of 15 mmHg. 18. The left ventricular function has critically worse. 19. No intracardiac thrombi or masses were visualized. In comparison to the previous echocardiogram(s): The left ventricular function is significantly worse. The left ventricular wall motion abnormalities are significantly worse. FINDINGS  Left Ventricle: Left ventricular ejection fraction, by visual estimation, is 25 to 30%. The left ventricle has severely decreased function. Mild dyskinesis of the left ventricular, entire apical segment. Severe hypokinesis of the left ventricular, entire anteroseptal wall and anterior wall. Severe hypokinesis of the left ventricular, entire inferolateral wall. The left ventricle demonstrates global hypokinesis. There is no left ventricular hypertrophy. Left ventricular diastolic parameters are consistent with Grade II diastolic dysfunction (pseudonormalization). Elevated left atrial pressure. Right Ventricle: The right ventricular size is normal. No increase in right ventricular wall thickness. Global RV systolic function is has moderately reduced systolic function. The tricuspid regurgitant velocity is 2.40 m/s, and with an assumed right atrial pressure of 15 mmHg, the estimated right ventricular systolic pressure is mildly elevated at 38.0 mmHg.  Left Atrium: Left atrial size was mildly dilated. Right Atrium: Right atrial size was normal in size Pericardium: Trivial pericardial effusion is present. Mitral Valve: The mitral valve is normal in structure. Mild mitral valve regurgitation. Tricuspid Valve: The tricuspid valve is normal in structure. Tricuspid valve regurgitation is mild. Aortic Valve: The aortic valve is normal in structure. Aortic valve regurgitation is not visualized. Pulmonic Valve: The pulmonic valve was not well visualized. Pulmonic valve regurgitation is not visualized. Pulmonic regurgitation is not visualized. Aorta: The aortic root and ascending aorta are structurally normal, with no evidence of dilitation. Venous: The inferior vena cava is dilated in size with less than 50% respiratory variability, suggesting right atrial  pressure of 15 mmHg. IAS/Shunts: No atrial level shunt detected by color flow Doppler. Additional Comments: No intracardiac thrombi or masses were visualized.  LEFT VENTRICLE PLAX 2D LVIDd:         5.10 cm       Diastology LVIDs:         4.10 cm       LV e' lateral:   6.09 cm/s LV PW:         1.30 cm       LV E/e' lateral: 14.5 LV IVS:        0.90 cm       LV e' medial:    5.22 cm/s LVOT diam:     2.10 cm       LV E/e' medial:  16.9 LV SV:         50 ml LV SV Index:   27.01 LVOT Area:     3.46 cm  LV Volumes (MOD) LV area d, A4C:    27.80 cm LV area s, A4C:    22.80 cm LV major d, A4C:   7.89 cm LV major s, A4C:   7.36 cm LV vol d, MOD A4C: 82.3 ml LV vol s, MOD A4C: 60.5 ml LV SV MOD A4C:     82.3 ml RIGHT VENTRICLE RV S prime:     7.62 cm/s TAPSE (M-mode): 1.2 cm LEFT ATRIUM             Index       RIGHT ATRIUM           Index LA diam:        4.10 cm 2.33 cm/m  RA Area:     11.20 cm LA Vol (A2C):   51.0 ml 28.95 ml/m RA Volume:   22.40 ml  12.72 ml/m LA Vol (A4C):   41.5 ml 23.56 ml/m LA Biplane Vol: 48.4 ml 27.47 ml/m  AORTIC VALVE LVOT Vmax:   86.80 cm/s LVOT Vmean:  60.900 cm/s LVOT VTI:    0.130 m  AORTA  Ao Root diam: 2.60 cm MITRAL VALVE                        TRICUSPID VALVE MV Area (PHT): 3.48 cm             TR Peak grad:   23.0 mmHg MV PHT:        63.22 msec           TR Vmax:        240.00 cm/s MV Decel Time: 218 msec MV E velocity: 88.30 cm/s 103 cm/s  SHUNTS MV A velocity: 86.10 cm/s 70.3 cm/s Systemic VTI:  0.13 m MV E/A ratio:  1.03       1.5       Systemic Diam: 2.10 cm  Dani Gobble Croitoru MD Electronically signed by Sanda Klein MD Signature Date/Time: 04/11/2019/11:57:02 AM    Final     Assessment and Plan:   1.  Acute hypercarbic respiratory failure -Management per CCM -She Is on steroids, nebulizers and antibiotics -She self extubated earlier today, prior to when they were planning to extubate her. -So far, she is maintaining her oxygen levels and is not hypercarbic, is tolerating a nonrebreather  2.  New cardiomyopathy - EF was 55% by echo in 2017. - 43% on Lexiscan Myoview from 2018 -The stress test was done preop for breast cancer surgery because of left-sided chest pain with exertion and  an abnormal ECG (similar to ECG today). -There was a defect in the apical anterior, apical inferior and apex location at that time.  She had been started on Imdur, and that was improving her symptoms. -Because there was only mild peri-infarct ischemia, it was decided not to pursue further ischemic evaluation at that time. -Her EF is now 25%, but wall motion abnormalities are similar to those seen on the Myoview from 2018. -Her ECG is not significantly changed from 2019, and her troponin is not significantly elevated -Because of the change in her EF, she may need an ischemic evaluation with R/L cath. -However, this would need to be performed after she is much more improved from her current acute illness. -She is on her home meds of aspirin, statin, beta-blocker, continue these - restart Imdur, can use 1" of paste q 6 hr if not taking po's well. - we will continue to follow.  Otherwise, per  CCM Active Problems:   Respiratory failure ([place])   Acute respiratory failure with hypercapnia ([place])     For questions or updates, please contact [place] Please consult [email or web address] for contact info under Cardiology/STEMI.   SignedRosaria Ferries, PA-C  04/12/2019 2:42 PM

## 2019-04-12 NOTE — Progress Notes (Signed)
eLink Physician-Brief Progress Note Patient Name: [name] DOB: [birth year] MRN: [id number]   Date of Service  04/12/2019  HPI/Events of Note  Hypertension - BP = 170/90.  Patient has had Hydralazine and Lopressor PRN doses.  Patient is on Imdur at home.   eICU Interventions  Will order: 1. Nitroglycerin ointment 1 inch to skin now and Q 6 hours.      Intervention Category Major Interventions: Hypertension - evaluation and management  Aleksia Freiman [name] 04/12/2019, 10:48 PM

## 2019-04-12 NOTE — Progress Notes (Signed)
Patient very hypertensive in the 170-180s. PRN Hydralazine and Metoprolol given as ordered with no change in BP. CCM notified of patients BP. See new order for nitroglycerin ointment. Order carried out. Will continue to monitor.

## 2019-04-13 DIAGNOSIS — I161 Hypertensive emergency: Principal | ICD-10-CM

## 2019-04-13 DIAGNOSIS — I25118 Atherosclerotic heart disease of native coronary artery with other forms of angina pectoris: Secondary | ICD-10-CM

## 2019-04-13 DIAGNOSIS — I5023 Acute on chronic systolic (congestive) heart failure: Secondary | ICD-10-CM

## 2019-04-13 DIAGNOSIS — I5022 Chronic systolic (congestive) heart failure: Secondary | ICD-10-CM

## 2019-04-13 DIAGNOSIS — J9601 Acute respiratory failure with hypoxia: Secondary | ICD-10-CM

## 2019-04-13 LAB — BASIC METABOLIC PANEL
Anion gap: 12 (ref 5–15)
BUN: 20 mg/dL (ref 6–20)
CO2: 22 mmol/L (ref 22–32)
Calcium: 9.2 mg/dL (ref 8.9–10.3)
Chloride: 108 mmol/L (ref 98–111)
Creatinine, Ser: 0.69 mg/dL (ref 0.44–1.00)
GFR calc Af Amer: >60 mL/min (ref >60)
GFR calc non Af Amer: >60 mL/min (ref >60)
Glucose, Bld: 131 mg/dL — ABNORMAL HIGH (ref 70–99)
Potassium: 3.8 mmol/L (ref 3.5–5.1)
Sodium: 142 mmol/L (ref 135–145)

## 2019-04-13 LAB — CBC
HCT: 37.7 % (ref 36.0–46.0)
Hemoglobin: 12.1 g/dL (ref 12.0–15.0)
MCH: 29.9 pg (ref 26.0–34.0)
MCHC: 32.1 g/dL (ref 30.0–36.0)
MCV: 93.1 fL (ref 80.0–100.0)
Platelets: 199 10*3/uL (ref 150–400)
RBC: 4.05 MIL/uL (ref 3.87–5.11)
RDW: 15.7 % — ABNORMAL HIGH (ref 11.5–15.5)
WBC: 20.4 10*3/uL — ABNORMAL HIGH (ref 4.0–10.5)
nRBC: 0 % (ref 0.0–0.2)

## 2019-04-13 LAB — CULTURE, RESPIRATORY W GRAM STAIN: Culture: NORMAL

## 2019-04-13 LAB — GLUCOSE, CAPILLARY
Glucose-Capillary: 130 mg/dL — ABNORMAL HIGH (ref 70–99)
Glucose-Capillary: 129 mg/dL — ABNORMAL HIGH (ref 70–99)
Glucose-Capillary: 122 mg/dL — ABNORMAL HIGH (ref 70–99)
Glucose-Capillary: 112 mg/dL — ABNORMAL HIGH (ref 70–99)
Glucose-Capillary: 128 mg/dL — ABNORMAL HIGH (ref 70–99)

## 2019-04-13 LAB — PATHOLOGIST SMEAR REVIEW

## 2019-04-13 LAB — PHOSPHORUS: Phosphorus: 2.6 mg/dL (ref 2.5–4.6)

## 2019-04-13 MED ORDER — CLONIDINE HCL 0.1 MG PO TABS
0.1000 mg | ORAL_TABLET | Freq: Four times a day (QID) | ORAL | Status: DC | PRN
  Administered 2019-04-13 (×2): 0.1 mg via ORAL
  Filled 2019-04-13 (×2): qty 1

## 2019-04-13 MED ORDER — MOMETASONE FURO-FORMOTEROL FUM 200-5 MCG/ACT IN AERO
2.0000 | INHALATION_SPRAY | Freq: Two times a day (BID) | RESPIRATORY_TRACT | Status: DC
  Filled 2019-04-13: qty 8.8

## 2019-04-13 MED ORDER — ISOSORBIDE MONONITRATE ER 60 MG PO TB24
60.0000 mg | ORAL_TABLET | Freq: Every day | ORAL | Status: DC
  Administered 2019-04-13 – 2019-04-15 (×3): 60 mg via ORAL
  Filled 2019-04-13 (×3): qty 1

## 2019-04-13 MED ORDER — UMECLIDINIUM BROMIDE 62.5 MCG/INH IN AEPB
1.0000 | INHALATION_SPRAY | Freq: Every day | RESPIRATORY_TRACT | Status: DC
  Filled 2019-04-13: qty 7

## 2019-04-13 MED ORDER — IPRATROPIUM-ALBUTEROL 0.5-2.5 (3) MG/3ML IN SOLN
3.0000 mL | Freq: Four times a day (QID) | RESPIRATORY_TRACT | Status: DC
  Administered 2019-04-13 (×2): 3 mL via RESPIRATORY_TRACT
  Filled 2019-04-13 (×3): qty 3

## 2019-04-13 MED ORDER — FUROSEMIDE 10 MG/ML IJ SOLN: 40.0000 mg | Freq: Once | INTRAMUSCULAR | Status: DC

## 2019-04-13 MED ORDER — IPRATROPIUM-ALBUTEROL 0.5-2.5 (3) MG/3ML IN SOLN
3.0000 mL | Freq: Three times a day (TID) | RESPIRATORY_TRACT | Status: DC
  Administered 2019-04-14: 3 mL via RESPIRATORY_TRACT

## 2019-04-13 MED ORDER — LOSARTAN POTASSIUM 50 MG PO TABS
50.0000 mg | ORAL_TABLET | Freq: Every day | ORAL | Status: DC
  Administered 2019-04-13 – 2019-04-15 (×3): 50 mg via ORAL
  Filled 2019-04-13 (×3): qty 1

## 2019-04-13 MED ORDER — IPRATROPIUM-ALBUTEROL 0.5-2.5 (3) MG/3ML IN SOLN: 3.0000 mL | RESPIRATORY_TRACT | Status: DC | PRN

## 2019-04-13 MED ORDER — ALPRAZOLAM 0.25 MG PO TABS
0.2500 mg | ORAL_TABLET | Freq: Once | ORAL | Status: AC
  Administered 2019-04-13: 0.25 mg via ORAL
  Filled 2019-04-13: qty 1

## 2019-04-13 MED ORDER — FUROSEMIDE 20 MG PO TABS: 20.0000 mg | ORAL_TABLET | Freq: Every day | ORAL | Status: DC

## 2019-04-13 MED ORDER — PREDNISONE 20 MG PO TABS
50.0000 mg | ORAL_TABLET | Freq: Every day | ORAL | Status: DC
  Administered 2019-04-14 – 2019-04-15 (×2): 50 mg via ORAL
  Filled 2019-04-13 (×2): qty 1

## 2019-04-13 MED ORDER — FUROSEMIDE 40 MG PO TABS
40.0000 mg | ORAL_TABLET | Freq: Every day | ORAL | Status: DC
  Administered 2019-04-13 – 2019-04-15 (×3): 40 mg via ORAL
  Filled 2019-04-13 (×3): qty 1

## 2019-04-13 MED ORDER — POTASSIUM CHLORIDE CRYS ER 20 MEQ PO TBCR
20.0000 meq | EXTENDED_RELEASE_TABLET | Freq: Once | ORAL | Status: AC
  Administered 2019-04-13: 08:00:00 20 meq via ORAL
  Filled 2019-04-13: qty 1

## 2019-04-13 MED ORDER — VENLAFAXINE HCL ER 75 MG PO CP24
75.0000 mg | ORAL_CAPSULE | Freq: Every day | ORAL | Status: DC
  Administered 2019-04-14 – 2019-04-16 (×2): 75 mg via ORAL
  Filled 2019-04-13 (×4): qty 1

## 2019-04-13 MED ORDER — ORAL CARE MOUTH RINSE
15.0000 mL | Freq: Two times a day (BID) | OROMUCOSAL | Status: DC
  Administered 2019-04-13 – 2019-04-15 (×4): 15 mL via OROMUCOSAL

## 2019-04-13 MED ORDER — METOPROLOL SUCCINATE ER 50 MG PO TB24
50.0000 mg | ORAL_TABLET | Freq: Two times a day (BID) | ORAL | Status: DC
  Administered 2019-04-13 – 2019-04-16 (×7): 50 mg via ORAL
  Filled 2019-04-13 (×7): qty 1

## 2019-04-13 NOTE — Progress Notes (Signed)
Patient was found sitting on side of her bed trying to get out of bed. Patient stated she got intermittently confused and did not know where she was. Patient pulled her LIJ CVC out. Pressure held for 5-10 mins on site with minimal bleeding. Patient assisted back in bed and reoriented. Patient is currently alert and oriented to self, place, time, and situation. PERRLA intact. Neuro assessment WDNL with slight forgetfulness.CCM notified of event. Will continue to monitor.

## 2019-04-13 NOTE — Progress Notes (Signed)
Belongings sent with pt include - rings x 2 - necklace - cell phone - slippers - lower dentures in denture cup - prosthetic breast - denture cup of several stud earrings

## 2019-04-13 NOTE — Progress Notes (Signed)
eLink Physician-Brief Progress Note Patient Name: [name] DOB: November 21, 1970 MRN: [id number]   Date of Service  04/13/2019  HPI/Events of Note  Multiple issues: 1. Hypertension - BP = 171/85 and 2. Anxiety.   eICU Interventions  Will order: 1. Catapres 0.1 mg PO Q 6 hours PRN SBP > 170 or DBP > 100. 2. Xanax 0.25 mg PO X 1 now.      Intervention Category Major Interventions: Hypertension - evaluation and management  [name] [name] 04/13/2019, 12:59 AM

## 2019-04-13 NOTE — Progress Notes (Addendum)
Day shift RN attempted to give report to day shift RN x3. Day shift RN will give report to night shift RN. Pt transferred to [place] with her two gold rings on her fingers, cellphone, and clothing transferred with patient to [facility] as well.

## 2019-04-13 NOTE — Progress Notes (Signed)
Progress Note  Patient Name: Burlie Amir Date of Encounter: 04/13/2019  Primary Cardiologist: Shelva Majestic, MD   Subjective   She feels better this morning.  Speech is more spontaneous.  She recalls that her recurring bouts of chest tightness occurred prior to acute respiratory distress.  Chest discomfort have been intermittent for several days.  Inpatient Medications    Scheduled Meds: . anastrozole  1 mg Per Tube Daily  . aspirin  81 mg Per Tube Daily  . atorvastatin  80 mg Per Tube q1800  . Chlorhexidine Gluconate Cloth  6 each Topical Daily  . famotidine  20 mg Per Tube Daily  . furosemide  20 mg Oral Daily  . heparin  5,000 Units Subcutaneous Q8H  . insulin aspart  0-20 Units Subcutaneous Q4H  . isosorbide mononitrate  60 mg Oral Daily  . loratadine  10 mg Per Tube Daily  . losartan  50 mg Oral Daily  . mouth rinse  15 mL Mouth Rinse BID  . metoprolol tartrate  50 mg Oral BID  . mometasone-formoterol  2 puff Inhalation BID  . [START ON 04/14/2019] predniSONE  50 mg Oral Q breakfast  . umeclidinium bromide  1 puff Inhalation Daily  . [START ON 04/14/2019] venlafaxine XR  75 mg Oral Q breakfast   Continuous Infusions:  PRN Meds: acetaminophen (TYLENOL) oral liquid 160 mg/5 mL, acetaminophen, hydrALAZINE, ipratropium-albuterol, metoCLOPramide (REGLAN) injection, metoprolol tartrate, nitroGLYCERIN   Vital Signs    Vitals:   04/13/19 0728 04/13/19 0800 04/13/19 0900 04/13/19 1000  BP:  (!) 175/101 (!) 171/85 (!) 164/94  Pulse:  (!) 103 99 88  Resp:  (!) 23 (!) 40 (!) 29  Temp: 97.6 F (36.4 C)     TempSrc: Oral     SpO2: 98% 94% 97% 91%  Weight:      Height:        Intake/Output Summary (Last 24 hours) at 04/13/2019 1108 Last data filed at 04/13/2019 1000 Gross per 24 hour  Intake 395.92 ml  Output 780 ml  Net -384.08 ml   Last 3 Weights 04/13/2019 04/11/2019 04/13/2018  Weight (lbs) 164 lb 7.4 oz 165 lb 171 lb 4.8 oz  Weight (kg) 74.6 kg 74.844 kg  77.701 kg      Telemetry    Normal sinus rhythm- Personally Reviewed  ECG    Sinus rhythm, inferior Q waves as well as QS pattern V1 through V3.  ECG is not significantly different than prior tracings dating back to 2018.- Personally Reviewed  Physical Exam  Appears older than stated age GEN:  Still with some difficulty breathing Neck: No JVD Cardiac: RRR, soft systolic left parasternal murmur.  Respiratory:  Decreased breath sounds posteriorly. GI: Soft, nontender, non-distended  MS: No edema; No deformity. Neuro:  Nonfocal  Psych: Normal affect   Labs    High Sensitivity Troponin:   Recent Labs  Lab 04/11/19 0520 04/11/19 0746 04/12/19 0746 04/12/19 1435  TROPONINIHS 26* 50* 21* 26*      Chemistry Recent Labs  Lab 04/11/19 0520 04/11/19 0739 04/11/19 0746 04/11/19 0849 04/12/19 0414 04/12/19 0414 04/12/19 1439 04/12/19 2111 04/13/19 0243  NA 141   < >  --    < > 140   < > 142 143 142  K 4.2   < >  --    < > 4.1   < > 4.3 4.3 3.8  CL 103  --   --   --  110  --   --   --  108  CO2 24  --   --   --  21*  --   --   --  22  GLUCOSE 383*  --   --   --  140*  --   --   --  131*  BUN 6  --   --   --  20  --   --   --  20  CREATININE 1.19*   < > 1.05*  --  0.93  --   --   --  0.69  CALCIUM 9.3  --   --   --  9.3  --   --   --  9.2  PROT 6.5  --   --   --   --   --   --   --   --   ALBUMIN 3.6  --   --   --   --   --   --   --   --   AST 82*  --   --   --   --   --   --   --   --   ALT 46*  --   --   --   --   --   --   --   --   ALKPHOS 172*  --   --   --   --   --   --   --   --   BILITOT 0.6  --   --   --   --   --   --   --   --   GFRNONAA 54*   < > >60  --  >60  --   --   --  >60  GFRAA >60   < > >60  --  >60  --   --   --  >60  ANIONGAP 14  --   --   --  9  --   --   --  12   < > = values in this interval not displayed.     Hematology Recent Labs  Lab 04/11/19 0746 04/11/19 0849 04/12/19 0414 04/12/19 0414 04/12/19 1439 04/12/19 2111  04/13/19 0911  WBC 26.9*  --  15.6*  --   --   --  20.4*  RBC 4.81  --  4.18  --   --   --  4.05  HGB 14.4   < > 12.6   < > 14.3 13.9 12.1  HCT 47.0*   < > 38.6   < > 42.0 41.0 37.7  MCV 97.7  --  92.3  --   --   --  93.1  MCH 29.9  --  30.1  --   --   --  29.9  MCHC 30.6  --  32.6  --   --   --  32.1  RDW 14.6  --  14.9  --   --   --  15.7*  PLT 276  --  191  --   --   --  199   < > = values in this interval not displayed.    BNP Recent Labs  Lab 04/11/19 0520  BNP 348.6*     DDimer No results for input(s): DDIMER in the last 168 hours.   Radiology    DG CHEST PORT 1 VIEW  Result Date: 04/12/2019 CLINICAL DATA:  Respiratory distress EXAM: PORTABLE CHEST 1 VIEW COMPARISON:  April 12, 2019 FINDINGS: The patient  has been extubated. There are well-positioned left-sided subclavian and IJ central venous catheters. The heart size remains enlarged. There is vascular congestion. Mild interstitial edema is noted. There is no pneumothorax. No focal infiltrate. No acute osseous abnormality IMPRESSION: 1. Lines and tubes as above. 2. Cardiomegaly with improving interstitial edema. Electronically Signed   By: Constance Holster M.D.   On: 04/12/2019 21:15   DG Chest Port 1 View  Result Date: 04/12/2019 CLINICAL DATA:  ARDS. EXAM: PORTABLE CHEST 1 VIEW COMPARISON:  Radiographs yesterday, chest CT 10/07/2017 FINDINGS: Endotracheal tube tip 4.2 cm from the carina. Enteric tube tip below the diaphragm. Left chest port remains in place. Left internal jugular central line remains in place. Decreasing interstitial opacities from prior exam. Improving cardiomegaly. No pneumothorax. No large pleural effusion. No acute osseous abnormalities are seen. IMPRESSION: 1. Decreasing interstitial opacities from prior exam likely resolving pulmonary edema. Improving cardiomegaly. 2. Support apparatus unchanged. Electronically Signed   By: Keith Rake M.D.   On: 04/12/2019 05:47   ECHOCARDIOGRAM  COMPLETE  Result Date: 04/11/2019   ECHOCARDIOGRAM REPORT   Patient Name:   SHAKIRA HEGWOOD Date of Exam: 04/11/2019 Medical Rec #:  [id number]           Height:       62.0 in Accession #:    [id number]          Weight:       165.0 lb Date of Birth:  01/17/1971           BSA:          1.76 m Patient Age:    49 years            BP:           147/73 mmHg Patient Gender: F                   HR:           87 bpm. Exam Location:  Inpatient Procedure: 2D Echo, Color Doppler, Cardiac Doppler and Intracardiac            Opacification Agent Indications:    CAD Native Vessel i25.10  History:        Patient has prior history of Echocardiogram examinations, most                 recent 09/18/2015. CAD; Risk Factors:Hypertension.  Sonographer:    Raquel Sarna Senior RDCS Referring Phys: [id number] Francesca Jewett  Sonographer Comments: Echo performed with patient supine and on artificial respirator. IMPRESSIONS  1. Left ventricular ejection fraction, by visual estimation, is 25 to 30%. The left ventricle has severely decreased function. There is no left ventricular hypertrophy.  2. Mild dyskinesis of the left ventricular, entire apical segment.  3. Severe hypokinesis of the left ventricular, entire anteroseptal wall and anterior wall.  4. Severe hypokinesis of the left ventricular, entire inferolateral wall.  5. Elevated left atrial pressure.  6. Left ventricular diastolic parameters are consistent with Grade II diastolic dysfunction (pseudonormalization).  7. The left ventricle demonstrates global hypokinesis.  8. Global right ventricle has moderately reduced systolic function.The right ventricular size is normal. No increase in right ventricular wall thickness.  9. Left atrial size was mildly dilated. 10. Right atrial size was normal. 11. Trivial pericardial effusion is present. 12. The mitral valve is normal in structure. Mild mitral valve regurgitation. 13. The tricuspid valve is normal in structure. 14. The aortic valve is normal  in structure. Aortic  valve regurgitation is not visualized. 15. The pulmonic valve was not well visualized. Pulmonic valve regurgitation is not visualized. 16. Mildly elevated pulmonary artery systolic pressure. 17. The inferior vena cava is dilated in size with <50% respiratory variability, suggesting right atrial pressure of 15 mmHg. 18. The left ventricular function has critically worse. 19. No intracardiac thrombi or masses were visualized. In comparison to the previous echocardiogram(s): The left ventricular function is significantly worse. The left ventricular wall motion abnormalities are significantly worse. FINDINGS  Left Ventricle: Left ventricular ejection fraction, by visual estimation, is 25 to 30%. The left ventricle has severely decreased function. Mild dyskinesis of the left ventricular, entire apical segment. Severe hypokinesis of the left ventricular, entire anteroseptal wall and anterior wall. Severe hypokinesis of the left ventricular, entire inferolateral wall. The left ventricle demonstrates global hypokinesis. There is no left ventricular hypertrophy. Left ventricular diastolic parameters are consistent with Grade II diastolic dysfunction (pseudonormalization). Elevated left atrial pressure. Right Ventricle: The right ventricular size is normal. No increase in right ventricular wall thickness. Global RV systolic function is has moderately reduced systolic function. The tricuspid regurgitant velocity is 2.40 m/s, and with an assumed right atrial pressure of 15 mmHg, the estimated right ventricular systolic pressure is mildly elevated at 38.0 mmHg. Left Atrium: Left atrial size was mildly dilated. Right Atrium: Right atrial size was normal in size Pericardium: Trivial pericardial effusion is present. Mitral Valve: The mitral valve is normal in structure. Mild mitral valve regurgitation. Tricuspid Valve: The tricuspid valve is normal in structure. Tricuspid valve regurgitation is mild. Aortic  Valve: The aortic valve is normal in structure. Aortic valve regurgitation is not visualized. Pulmonic Valve: The pulmonic valve was not well visualized. Pulmonic valve regurgitation is not visualized. Pulmonic regurgitation is not visualized. Aorta: The aortic root and ascending aorta are structurally normal, with no evidence of dilitation. Venous: The inferior vena cava is dilated in size with less than 50% respiratory variability, suggesting right atrial pressure of 15 mmHg. IAS/Shunts: No atrial level shunt detected by color flow Doppler. Additional Comments: No intracardiac thrombi or masses were visualized.  LEFT VENTRICLE PLAX 2D LVIDd:         5.10 cm       Diastology LVIDs:         4.10 cm       LV e' lateral:   6.09 cm/s LV PW:         1.30 cm       LV E/e' lateral: 14.5 LV IVS:        0.90 cm       LV e' medial:    5.22 cm/s LVOT diam:     2.10 cm       LV E/e' medial:  16.9 LV SV:         50 ml LV SV Index:   27.01 LVOT Area:     3.46 cm  LV Volumes (MOD) LV area d, A4C:    27.80 cm LV area s, A4C:    22.80 cm LV major d, A4C:   7.89 cm LV major s, A4C:   7.36 cm LV vol d, MOD A4C: 82.3 ml LV vol s, MOD A4C: 60.5 ml LV SV MOD A4C:     82.3 ml RIGHT VENTRICLE RV S prime:     7.62 cm/s TAPSE (M-mode): 1.2 cm LEFT ATRIUM             Index       RIGHT ATRIUM  Index LA diam:        4.10 cm 2.33 cm/m  RA Area:     11.20 cm LA Vol (A2C):   51.0 ml 28.95 ml/m RA Volume:   22.40 ml  12.72 ml/m LA Vol (A4C):   41.5 ml 23.56 ml/m LA Biplane Vol: 48.4 ml 27.47 ml/m  AORTIC VALVE LVOT Vmax:   86.80 cm/s LVOT Vmean:  60.900 cm/s LVOT VTI:    0.130 m  AORTA Ao Root diam: 2.60 cm MITRAL VALVE                        TRICUSPID VALVE MV Area (PHT): 3.48 cm             TR Peak grad:   23.0 mmHg MV PHT:        63.22 msec           TR Vmax:        240.00 cm/s MV Decel Time: 218 msec MV E velocity: 88.30 cm/s 103 cm/s  SHUNTS MV A velocity: 86.10 cm/s 70.3 cm/s Systemic VTI:  0.13 m MV E/A ratio:  1.03        1.5       Systemic Diam: 2.10 cm  Mihai Croitoru MD Electronically signed by Sanda Klein MD Signature Date/Time: 04/11/2019/11:57:02 AM    Final     Cardiac Studies    Chest x-ray showed interstitial edema on admission  Echo shows EF less than 35%  Cardiac troponin I are low and flat.  No acute MI is documented by enzymes or EKG  Patient Profile     49 y.o. female with a hx of renovascular HTN s/p L RA stent, NSTEMI 2017 w/ 65% LAD rx medically, HTN, breast CA, lymphoma of neck, GERD, Bell's palsy, COPD/ILD, who is being seen today for the evaluation of new CM w/ EF 25% and elevated troponin at the request of Dr Loanne Drilling.Prior exposure to cardiotoxic chemotherapy during Lymphoma and Breast cancer therapy.  Previous nuclear study demonstrated apical infarction 2017.  Assessment & Plan    1. Acute on chronic systolic heart failure: Begin and/or titrate guideline directed therapy for systolic heart failure.  Etiology is uncertain and could be related to cardiotoxicity from prior chemotherapy, stress induced component related to respiratory failure, and/or progression of coronary disease. 2. CAD with angina: Once appropriately stable, will have left and right heart cath with coronary angiography to evaluate therapy of heart failure and exclude progression of coronary disease as etiology for low EF.  Cath in 2017 demonstrated mid to distal LAD 50 to 70% stenosis (possibly SCAD). 3. Renovascular hypertension: Needs a duplex scan to rule out recurrent renal artery stenosis which could have been a precipitant for pulmonary edema. 4. Hyperlipidemia 5. 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 History of breast cancer: Status post Taxotere oxygen 7. Asthma: Management per critical care       For questions or updates, please contact [place] Please consult [email or web address] for contact info under        Signed, Sinclair Grooms, MD  04/13/2019, 11:08 AM

## 2019-04-13 NOTE — Plan of Care (Signed)
Pt Aox4, has no complaints of pain, and has been on room air all shift. Standby assist and sat on the chair for a few hours today. Daughter, Janeal Holmes was at bedside this morning, all questions were answered, and updated on her mom.   Problem: Education: Goal: Knowledge of General Education information will improve Description: Including pain rating scale, medication(s)/side effects and non-pharmacologic comfort measures Outcome: Progressing   Problem: Health Behavior/Discharge Planning: Goal: Ability to manage health-related needs will improve Outcome: Progressing   Problem: Clinical Measurements: Goal: Ability to maintain clinical measurements within normal limits will improve Outcome: Progressing Goal: Will remain free from infection Outcome: Progressing Goal: Diagnostic test results will improve Outcome: Progressing Goal: Respiratory complications will improve Outcome: Progressing Goal: Cardiovascular complication will be avoided Outcome: Progressing   Problem: Activity: Goal: Risk for activity intolerance will decrease Outcome: Progressing   Problem: Nutrition: Goal: Adequate nutrition will be maintained Outcome: Progressing   Problem: Coping: Goal: Level of anxiety will decrease Outcome: Progressing   Problem: Elimination: Goal: Will not experience complications related to bowel motility Outcome: Progressing Goal: Will not experience complications related to urinary retention Outcome: Progressing   Problem: Pain Managment: Goal: General experience of comfort will improve Outcome: Progressing   Problem: Safety: Goal: Ability to remain free from injury will improve Outcome: Progressing   Problem: Skin Integrity: Goal: Risk for impaired skin integrity will decrease Outcome: Progressing

## 2019-04-13 NOTE — Progress Notes (Signed)
[facility] will take over care at 7am on 1/20. Greatly appreciate their assistance.

## 2019-04-13 NOTE — Progress Notes (Signed)
NAME:  Maverick Patman, MRN:  [id number], DOB:  09-17-1970, LOS: 2 ADMISSION DATE:  04/11/2019, CONSULTATION DATE: 04/11/2019 REFERRING MD: ER, CHIEF COMPLAINT: Shortness of breath  Brief History   49 year old woman with a history of breast cancer in remission, COPD/ILD, CAD presenting with acute hypercarbic respiratory failure requiring intubation.  History of present illness   49 year old woman with a history of breast cancer in remission, asthma/COPD/ILD presenting with acute hypercarbic respiratory failure requiring intubation.  Apparently the onset was sudden.  The patient is intubated and heavily sedated so history is per chart review.  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  Past Medical History   Past Medical History:  Diagnosis Date  . Asthma    beginning of 2018 - came to ER    . Bell's palsy   . Breast cancer ([place]) 01/2017  . Cardiomegaly   . Coronary artery disease    NSTEMI 08/2015 (65% LAD; D1 50%)  . Family history of breast cancer   . Family history of prostate cancer   . 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 GERD (gastroesophageal reflux disease)   . Hypertension   . 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 Myocardial infarction ([place]) 2017  . Renal artery stenosis ([facility])    s/p stent placment to left      Significant Hospital Events   04/11/2019 admitted, intubated  Consults:  PCCM  Procedures:  Intubation  Significant Diagnostic Tests:  Chest x-ray with diffuse bilateral infiltrates on top of chronic lung disease  Micro Data:  Covid negative Flu negative  Antimicrobials:  Ceftriaxone 1/17 >> Azithromycin 1/17 >>  Interim history/subjective:  Overnight patient had episode of emesis and pulled out her LIJ. Per chart patient reported  She was intermittently confused when she pulled line out. She was AOx4 on RN exam  and minimal bleeding reported tfrom IJ. Patient requiring PRN meds overnight for SBP > 160,  Hydralazine, Metoprolol tartrate, and one time dose of catapres.   Objective   Blood pressure (!) 152/86, pulse 100, temperature 98.5 F (36.9 C), temperature source Oral, resp. rate (!) 24, height '5\' 2"'  (1.575 m), weight 74.6 kg, SpO2 97 %.    Vent Mode: PSV;CPAP FiO2 (%):  [40 %] 40 % Set Rate:  [28 bmp] 28 bmp Vt Set:  [620 mL] 620 mL PEEP:  [5 cmH20-8 cmH20] 5 cmH20 Pressure Support:  [10 cmH20] 10 cmH20 Plateau Pressure:  [23 cmH20] 23 cmH20   Intake/Output Summary (Last 24 hours) at 04/13/2019 0558 Last data filed at 04/13/2019 0000 Gross per 24 hour  Intake 765.38 ml  Output 635 ml  Net 130.38 ml   Filed Weights   04/11/19 0550 04/13/19 0314  Weight: 74.8 kg 74.6 kg    Examination: General: NAD, HEENT: PEERL, EOMI, anticteric sclera Cardiovascular: RRR, no mrg Pulmonary : wheezes bilateral Abdominal:normal bowel sounds, NT Genitourinary: Burning with urination Neurological: Alert and oriented   Resolved Hospital Problem list   Hypercarbic and hypoxic respiratory failure 2/2 hypertensive emergency - self extubated on 1/18  Assessment & Plan:  # HTN Patient on Metoprolol Succinate 100 mg at home, reports running out of this medication for approximately a month to two months. Overnight requirng PRN medication to maintain SBP less than 160. On care everywhere patient has been on losartan in the past.  P: - SBP goal < 160 , DBP < 110 -Metoprolol tartrate 50 mg twice daily - Losartan 25 mg, and titrate up - PRN Hydralazine , and PRN Lopressor   CAP  Asthma/COPD -Ceftriaxone, azithromycin, steroids, bronchodilators -Titrate PEEP and FiO2 per usual ARDS protocol -VAP prevention measures -Check a.m. x-ray  #Demand Ischemia # Hx CAD - History of CAD, cath 09/18/15 with 65% LAD. - continue Asprin/Statin - hsTrop, 26 > 50, Trend troponin  - Consult Cardiology , patient will  likely need heart cath at some point.     # Abnormal CT-warrants outpatient pulmonology follow-up  #Depression - Restart home Effexor   # Hyperglycemia- in setting of acute illness and steroids A1c 5.5 on admission -  SSI  # History of breast cancer and lymphoma-she has a history of CHOP therapy which raises the risk of cardiomyopathy although echo 4 years ago was benign, she has also received neoadjuvant Taxotere and Cytoxan completed in 2018, adjuvant radiation therapy completed in 2019, on chronic antiestrogen therapy with tamoxifen   Best practice:  Diet: Start tube feeds Pain/Anxiety/Delirium protocol (if indicated): Propofol and fentanyl VAP protocol (if indicated): Ordered 1/17 DVT prophylaxis: Lovenox GI prophylaxis: Pepcid Glucose control: SSI Mobility: Bedrest Code Status: Full Family Communication: Called significant other and daughter, no answer Disposition: ICU  Labs   CBC: Recent Labs  Lab 04/11/19 0520 04/11/19 0739 04/11/19 0746 04/11/19 0746 04/11/19 0849 04/11/19 1631 04/12/19 0414 04/12/19 1439 04/12/19 2111  WBC 17.3*  --  26.9*  --   --   --  15.6*  --   --   NEUTROABS 6.6  --  23.9*  --   --   --   --   --   --   HGB 14.1   < > 14.4   < > 13.9 13.9 12.6 14.3 13.9  HCT 46.5*   < > 47.0*   < > 41.0 41.0 38.6 42.0 41.0  MCV 99.1  --  97.7  --   --   --  92.3  --   --   PLT 266  --  276  --   --   --  191  --   --    < > = values in this interval not displayed.    Basic Metabolic Panel: Recent Labs  Lab 04/11/19 0520 04/11/19 0739 04/11/19 0746 04/11/19 0849 04/11/19 1631 04/11/19 1634 04/12/19 0414 04/12/19 1439 04/12/19 2022 04/12/19 2111 04/13/19 0243  NA 141   < >  --    < > 142  --  140 142  --  143 142  K 4.2   < >  --    < > 3.8  --  4.1 4.3  --  4.3 3.8  CL 103  --   --   --   --   --  110  --   --   --  108  CO2 24  --   --   --   --   --  21*  --   --   --  22  GLUCOSE 383*  --   --   --   --   --  140*  --   --   --  131*   BUN 6  --   --   --   --   --  20  --   --   --  20  CREATININE 1.19*  --  1.05*  --   --   --  0.93  --   --   --  0.69  CALCIUM 9.3  --   --   --   --   --  9.3  --   --   --  9.2  MG 2.3  --  2.3  --   --  1.7 1.7  --  2.3  --   --   PHOS  --   --  8.0*  --   --  2.8 2.1*  --  2.6  --  2.6   < > = values in this interval not displayed.   GFR: Estimated Creatinine Clearance: 82.2 mL/min (by C-G formula based on SCr of 0.69 mg/dL). Recent Labs  Lab 04/11/19 0520 04/11/19 0746 04/12/19 0414  PROCALCITON  --  1.22  --   WBC 17.3* 26.9* 15.6*    Liver Function Tests: Recent Labs  Lab 04/11/19 0520  AST 82*  ALT 46*  ALKPHOS 172*  BILITOT 0.6  PROT 6.5  ALBUMIN 3.6   No results for input(s): LIPASE, AMYLASE in the last 168 hours. No results for input(s): AMMONIA in the last 168 hours.  ABG    Component Value Date/Time   PHART 7.493 (H) 04/12/2019 2111   PCO2ART 31.1 (L) 04/12/2019 2111   PO2ART 83.0 04/12/2019 2111   HCO3 23.7 04/12/2019 2111   TCO2 25 04/12/2019 2111   ACIDBASEDEF 4.0 (H) 04/11/2019 1631   O2SAT 97.0 04/12/2019 2111     Coagulation Profile: No results for input(s): INR, PROTIME in the last 168 hours.  Cardiac Enzymes: No results for input(s): CKTOTAL, CKMB, CKMBINDEX, TROPONINI in the last 168 hours.  HbA1C: Hgb A1c MFr Bld  Date/Time Value Ref Range Status  04/11/2019 07:46 AM 5.5 4.8 - 5.6 % Final    Comment:    (NOTE) Pre diabetes:          5.7%-6.4% Diabetes:              >6.4% Glycemic control for   <7.0% adults with diabetes     CBG: Recent Labs  Lab 04/12/19 1120 04/12/19 1620 04/12/19 2015 04/12/19 2311 04/13/19 0255  GLUCAP 119* 144* 136* 131* 130*    Review of Systems:   Cannot assess patient is intubated and sedated  Past Medical History  She,  has a past medical history of Asthma, Bell's palsy, Breast cancer ([place]) (01/2017), Cardiomegaly, Coronary artery disease, Family history of breast cancer, Family history  of prostate cancer, Genetic testing of female, GERD (gastroesophageal reflux disease), Hypertension, Lymphoma of lymph nodes of neck ([place]), Myocardial infarction ([place]) (2017), and Renal artery stenosis ([place]).   Surgical History    Past Surgical History:  Procedure Laterality Date  . AXILLARY LYMPH NODE DISSECTION Right 03/13/2017   Procedure: AXILLARY LYMPH NODE DISSECTION;  Surgeon: Erroll Luna, MD;  Location: [facility];  Service: General;  Laterality: Right;  . BREAST RECONSTRUCTION WITH PLACEMENT OF TISSUE EXPANDER AND FLEX HD (ACELLULAR HYDRATED DERMIS) Right 01/30/2017   Procedure: RIGHT BREAST RECONSTRUCTION WITH PLACEMENT OF TISSUE EXPANDER AND FLEX HD (ACELLULAR HYDRATED DERMIS);  Surgeon: Wallace Going, DO;  Location: [facility];  Service: Plastics;  Laterality: Right;  . BREAST SURGERY     breast bx  07/2016  . CARDIAC CATHETERIZATION N/A 09/18/2015   Procedure: Left Heart Cath and Coronary Angiography;  Surgeon: Leonie Man, MD;  Location: [facility];  Service: Cardiovascular;  Laterality: N/A;  . [place]   x2  .  IR CV LINE INJECTION  07/24/2016  . MASTECTOMY WITH RADIOACTIVE SEED GUIDED EXCISION AND AXILLARY SENTINEL LYMPH NODE BIOPSY Right 01/30/2017   Procedure: RIGHT SIMPLE MASTECTOMY WITH  RADIOACTIVE SEED TARGETED RIGHT AXILLARY LYMPH NODE EXCISION AND RIGHT AXILLARY SENTINEL LYMPH NODE BIOPSY;  Surgeon: Erroll Luna, MD;  Location: [facility];  Service: General;  Laterality: Right;  . PERIPHERAL VASCULAR CATHETERIZATION N/A 08/23/2014   Procedure: Renal Angiography;  Surgeon: Adrian Prows, MD;  Location: [facility];  Service: Cardiovascular;  Laterality: N/A;  . RENAL ARTERY STENT Left 08/23/2014  . SIMPLE MASTECTOMY Right 01/30/2017  . SKIN BIOPSY  2000  . TISSUE EXPANDER PLACEMENT Right 10/08/2017   Procedure: REMOVAL OF RIGHT BREAST TISSUE EXPANDER;  Surgeon: Wallace Going, DO;  Location: [facility];  Service: Plastics;  Laterality:  Right;  Case should be 45 min  . TUBAL LIGATION       Social History   reports that she has been smoking cigarettes. She has a 7.50 pack-year smoking history. She has never used smokeless tobacco. She reports that she does not drink alcohol or use drugs.   Family History   Her family history includes Asthma in her father; Breast cancer in her maternal aunt; Cervical cancer in her mother; Coronary artery disease (age of onset: 51) in her father; Hypertension in her mother; Prostate cancer in her cousin. There is no history of Migraines.   Allergies Allergies  Allergen Reactions  . Compazine [Prochlorperazine] Anaphylaxis, Swelling and Other (See Comments)    Throat swelling  . Ondansetron Hcl Anaphylaxis, Swelling and Other (See Comments)    Throat swelling per patient     Home Medications  Prior to Admission medications   Medication Sig Start Date End Date Taking? Authorizing Provider  acetaminophen (TYLENOL) 500 MG tablet Take 1,000 mg by mouth every 8 (eight) hours as needed for mild pain or headache.    [provider]  albuterol (PROVENTIL) (2.5 MG/3ML) 0.083% nebulizer solution Take 3 mLs (2.5 mg total) by nebulization every 6 (six) hours as needed for wheezing or shortness of breath. 04/12/16   Tanna Furry, MD  amLODipine (NORVASC) 10 MG tablet Take 10 mg by mouth daily.    [provider]  anastrozole (ARIMIDEX) 1 MG tablet Take 1 tablet (1 mg total) by mouth daily. 10/12/18   Nicholas Lose, MD  aspirin 81 MG chewable tablet Chew 81 mg by mouth daily.    [provider]  atorvastatin (LIPITOR) 80 MG tablet Take 1 tablet (80 mg total) by mouth daily at 6 PM. 09/20/15   Florencia Reasons, MD  cetirizine (ZYRTEC) 10 MG tablet Take 10 mg by mouth every evening. 09/29/17   [provider]  [employer] D3 5000 units capsule Take 5,000 Units by mouth daily. 09/29/17   [provider]  ipratropium (ATROVENT) 0.03 % nasal spray Place 2 sprays into both nostrils 2  (two) times daily as needed for rhinitis. unk 09/29/17   [provider]  isosorbide mononitrate (IMDUR) 60 MG 24 hr tablet Take 1 tablet (60 mg total) by mouth daily. 03/09/19 06/07/19  Troy Sine, MD  losartan (COZAAR) 50 MG tablet Take 50 mg by mouth daily. 06/28/15   [provider]  metoprolol succinate (TOPROL XL) 100 MG 24 hr tablet Take 1 tablet (100 mg total) by mouth daily. Take with or immediately following a meal. 09/20/15   Florencia Reasons, MD  mometasone-formoterol [facility]) 200-5 MCG/ACT AERO Inhale 2 puffs into the lungs 2 (  two) times daily. 04/12/16   Tanna Furry, MD  nitroGLYCERIN (NITROSTAT) 0.4 MG SL tablet Place 0.4 mg under the tongue every 5 (five) minutes as needed for chest pain.    [provider]  PROAIR HFA 108 [phone] Base) MCG/ACT inhaler Inhale 2 puffs into the lungs every 6 (six) hours as needed for wheezing or shortness of breath.  11/01/16   [provider]  venlafaxine XR (EFFEXOR-XR) 75 MG 24 hr capsule Take 1 capsule (75 mg total) by mouth daily with breakfast. 10/12/18   Nicholas Lose, MD     Critical care time: 45 minutes not including any separately billable procedures

## 2019-04-14 ENCOUNTER — Other Ambulatory Visit: Payer: Self-pay

## 2019-04-14 ENCOUNTER — Encounter (HOSPITAL_COMMUNITY): Payer: Self-pay | Admitting: Internal Medicine

## 2019-04-14 ENCOUNTER — Inpatient Hospital Stay (HOSPITAL_COMMUNITY): Payer: Medicaid Other

## 2019-04-14 DIAGNOSIS — I701 Atherosclerosis of renal artery: Secondary | ICD-10-CM

## 2019-04-14 DIAGNOSIS — J9602 Acute respiratory failure with hypercapnia: Secondary | ICD-10-CM

## 2019-04-14 DIAGNOSIS — I25110 Atherosclerotic heart disease of native coronary artery with unstable angina pectoris: Secondary | ICD-10-CM

## 2019-04-14 LAB — BASIC METABOLIC PANEL
Anion gap: 9 (ref 5–15)
BUN: 23 mg/dL — ABNORMAL HIGH (ref 6–20)
CO2: 23 mmol/L (ref 22–32)
Calcium: 10 mg/dL (ref 8.9–10.3)
Chloride: 107 mmol/L (ref 98–111)
Creatinine, Ser: 0.94 mg/dL (ref 0.44–1.00)
GFR calc Af Amer: >60 mL/min (ref >60)
GFR calc non Af Amer: >60 mL/min (ref >60)
Glucose, Bld: 108 mg/dL — ABNORMAL HIGH (ref 70–99)
Potassium: 3.3 mmol/L — ABNORMAL LOW (ref 3.5–5.1)
Sodium: 139 mmol/L (ref 135–145)

## 2019-04-14 LAB — GLUCOSE, CAPILLARY
Glucose-Capillary: 107 mg/dL — ABNORMAL HIGH (ref 70–99)
Glucose-Capillary: 111 mg/dL — ABNORMAL HIGH (ref 70–99)
Glucose-Capillary: 112 mg/dL — ABNORMAL HIGH (ref 70–99)
Glucose-Capillary: 118 mg/dL — ABNORMAL HIGH (ref 70–99)
Glucose-Capillary: 126 mg/dL — ABNORMAL HIGH (ref 70–99)
Glucose-Capillary: 94 mg/dL (ref 70–99)

## 2019-04-14 LAB — CBC
HCT: 41 % (ref 36.0–46.0)
Hemoglobin: 13.3 g/dL (ref 12.0–15.0)
MCH: 30 pg (ref 26.0–34.0)
MCHC: 32.4 g/dL (ref 30.0–36.0)
MCV: 92.6 fL (ref 80.0–100.0)
Platelets: 213 10*3/uL (ref 150–400)
RBC: 4.43 MIL/uL (ref 3.87–5.11)
RDW: 15.4 % (ref 11.5–15.5)
WBC: 20.1 10*3/uL — ABNORMAL HIGH (ref 4.0–10.5)
nRBC: 0 % (ref 0.0–0.2)

## 2019-04-14 MED ORDER — POTASSIUM CHLORIDE CRYS ER 20 MEQ PO TBCR: 20.0000 meq | EXTENDED_RELEASE_TABLET | Freq: Once | ORAL | Status: DC

## 2019-04-14 MED ORDER — MOMETASONE FURO-FORMOTEROL FUM 100-5 MCG/ACT IN AERO
2.0000 | INHALATION_SPRAY | Freq: Two times a day (BID) | RESPIRATORY_TRACT | Status: DC
  Administered 2019-04-14 – 2019-04-16 (×4): 2 via RESPIRATORY_TRACT
  Filled 2019-04-14: qty 8.8

## 2019-04-14 MED ORDER — POTASSIUM CHLORIDE 10 MEQ/100ML IV SOLN
10.0000 meq | INTRAVENOUS | Status: AC
  Administered 2019-04-14 (×2): 10 meq via INTRAVENOUS
  Filled 2019-04-14 (×2): qty 100

## 2019-04-14 MED ORDER — IPRATROPIUM-ALBUTEROL 0.5-2.5 (3) MG/3ML IN SOLN: 3.0000 mL | Freq: Two times a day (BID) | RESPIRATORY_TRACT | Status: DC

## 2019-04-14 MED ORDER — SODIUM CHLORIDE 0.9 % IV SOLN: INTRAVENOUS | Status: DC

## 2019-04-14 MED ORDER — POTASSIUM CHLORIDE CRYS ER 20 MEQ PO TBCR
40.0000 meq | EXTENDED_RELEASE_TABLET | Freq: Once | ORAL | Status: AC
  Administered 2019-04-14: 13:00:00 40 meq via ORAL
  Filled 2019-04-14: qty 2

## 2019-04-14 MED ORDER — UMECLIDINIUM BROMIDE 62.5 MCG/INH IN AEPB
1.0000 | INHALATION_SPRAY | Freq: Every day | RESPIRATORY_TRACT | Status: DC
  Administered 2019-04-15 – 2019-04-16 (×2): 1 via RESPIRATORY_TRACT
  Filled 2019-04-14: qty 7

## 2019-04-14 MED ORDER — ALBUTEROL SULFATE (2.5 MG/3ML) 0.083% IN NEBU: 2.5000 mg | INHALATION_SOLUTION | RESPIRATORY_TRACT | Status: DC | PRN

## 2019-04-14 MED ORDER — IPRATROPIUM-ALBUTEROL 0.5-2.5 (3) MG/3ML IN SOLN
3.0000 mL | Freq: Four times a day (QID) | RESPIRATORY_TRACT | Status: DC
  Administered 2019-04-14: 14:00:00 3 mL via RESPIRATORY_TRACT
  Filled 2019-04-14: qty 3

## 2019-04-14 MED ORDER — LORATADINE 10 MG PO TABS
10.0000 mg | ORAL_TABLET | Freq: Every day | ORAL | Status: DC
  Administered 2019-04-15 – 2019-04-16 (×2): 10 mg via ORAL
  Filled 2019-04-14 (×2): qty 1

## 2019-04-14 MED ORDER — SODIUM CHLORIDE 0.9% FLUSH: 3.0000 mL | INTRAVENOUS | Status: DC | PRN

## 2019-04-14 MED ORDER — FAMOTIDINE 40 MG/5ML PO SUSR
20.0000 mg | Freq: Every day | ORAL | Status: DC
  Administered 2019-04-15 – 2019-04-16 (×2): 20 mg via ORAL
  Filled 2019-04-14 (×2): qty 2.5

## 2019-04-14 MED ORDER — SODIUM CHLORIDE 0.9% FLUSH
3.0000 mL | Freq: Two times a day (BID) | INTRAVENOUS | Status: DC
  Administered 2019-04-14 – 2019-04-15 (×4): 3 mL via INTRAVENOUS

## 2019-04-14 MED ORDER — SODIUM CHLORIDE 0.9 % IV SOLN: 250.0000 mL | INTRAVENOUS | Status: DC | PRN

## 2019-04-14 MED ORDER — SPIRONOLACTONE 25 MG PO TABS
25.0000 mg | ORAL_TABLET | Freq: Every day | ORAL | Status: DC
  Administered 2019-04-14 – 2019-04-16 (×3): 25 mg via ORAL
  Filled 2019-04-14 (×3): qty 1

## 2019-04-14 MED ORDER — FUROSEMIDE 10 MG/ML IJ SOLN
40.0000 mg | Freq: Once | INTRAMUSCULAR | Status: AC
  Administered 2019-04-14: 40 mg via INTRAVENOUS
  Filled 2019-04-14: qty 4

## 2019-04-14 MED ORDER — ANASTROZOLE 1 MG PO TABS
1.0000 mg | ORAL_TABLET | Freq: Every day | ORAL | Status: DC
  Administered 2019-04-15 – 2019-04-16 (×2): 1 mg via ORAL
  Filled 2019-04-14 (×2): qty 1

## 2019-04-14 NOTE — Progress Notes (Addendum)
The patient has been seen in conjunction with Reino Bellis, NP. All aspects of care have been considered and discussed. The patient has been personally interviewed, examined, and all clinical data has been reviewed.   She feels short of breath this morning.  There is a component of orthopnea.  She has decreased breath sounds with faint wheezing.  Blood pressure is extremely elevated.  Plan more aggressive diuresis today, following creatinine and potassium.  Possible right and left heart cath tomorrow depending upon clinical status and kidney function.  No evidence RAS by duplex.   Progress Note  Patient Name: [name] Date of Encounter: 04/14/2019  Primary Cardiologist: Shelva Majestic, MD   Subjective   No complaints this morning.   Inpatient Medications    Scheduled Meds: . [START ON 04/15/2019] anastrozole  1 mg Oral Daily  . aspirin  81 mg Per Tube Daily  . atorvastatin  80 mg Per Tube q1800  . [START ON 04/15/2019] famotidine  20 mg Oral Daily  . furosemide  40 mg Oral Daily  . heparin  5,000 Units Subcutaneous Q8H  . insulin aspart  0-20 Units Subcutaneous Q4H  . ipratropium-albuterol  3 mL Nebulization TID  . isosorbide mononitrate  60 mg Oral Daily  . [START ON 04/15/2019] loratadine  10 mg Oral Daily  . losartan  50 mg Oral Daily  . mouth rinse  15 mL Mouth Rinse BID  . metoprolol succinate  50 mg Oral BID  . potassium chloride  20 mEq Oral Once  . predniSONE  50 mg Oral Q breakfast  . spironolactone  25 mg Oral Daily  . venlafaxine XR  75 mg Oral Q breakfast   Continuous Infusions:  PRN Meds: acetaminophen (TYLENOL) oral liquid 160 mg/5 mL, acetaminophen, hydrALAZINE, metoCLOPramide (REGLAN) injection, nitroGLYCERIN   Vital Signs    Vitals:   04/14/19 0600 04/14/19 0732 04/14/19 0834 04/14/19 0947  BP:  (!) 156/90 (!) 160/87   Pulse: 83 82 85 81  Resp: (!) 27 (!) 26    Temp:   98.8 F (37.1 C)   TempSrc:   Oral   SpO2: 98% 98% 100%     Weight:      Height:        Intake/Output Summary (Last 24 hours) at 04/14/2019 1004 Last data filed at 04/14/2019 0500 Gross per 24 hour  Intake --  Output 1350 ml  Net -1350 ml   Last 3 Weights 04/14/2019 04/13/2019 04/11/2019  Weight (lbs) 167 lb 8 oz 164 lb 7.4 oz 165 lb  Weight (kg) 75.978 kg 74.6 kg 74.844 kg      Telemetry    SR with PVCs - Personally Reviewed  ECG    No new tracing this morning.  Physical Exam  AAF, laying in bed. GEN: No acute distress.   Neck: No JVD Cardiac: RRR, no murmurs, rubs, or gallops.  Respiratory: Mild crackles at bases.  GI: Soft, nontender, non-distended  MS: No edema; No deformity. Neuro:  Nonfocal  Psych: Normal affect   Labs    High Sensitivity Troponin:   Recent Labs  Lab 04/11/19 0520 04/11/19 0746 04/12/19 0746 04/12/19 1435  TROPONINIHS 26* 50* 21* 26*      Chemistry Recent Labs  Lab 04/11/19 0520 04/11/19 0739 04/12/19 0414 04/12/19 1439 04/12/19 2111 04/13/19 0243 04/14/19 0255  NA 141   < > 140   < > 143 142 139  K 4.2   < > 4.1   < >  4.3 3.8 3.3*  CL 103   < > 110  --   --  108 107  CO2 24   < > 21*  --   --  22 23  GLUCOSE 383*   < > 140*  --   --  131* 108*  BUN 6   < > 20  --   --  20 23*  CREATININE 1.19*   < > 0.93  --   --  0.69 0.94  CALCIUM 9.3   < > 9.3  --   --  9.2 10.0  PROT 6.5  --   --   --   --   --   --   ALBUMIN 3.6  --   --   --   --   --   --   AST 82*  --   --   --   --   --   --   ALT 46*  --   --   --   --   --   --   ALKPHOS 172*  --   --   --   --   --   --   BILITOT 0.6  --   --   --   --   --   --   GFRNONAA 54*   < > >60  --   --  >60 >60  GFRAA >60   < > >60  --   --  >60 >60  ANIONGAP 14   < > 9  --   --  12 9   < > = values in this interval not displayed.     Hematology Recent Labs  Lab 04/12/19 0414 04/12/19 1439 04/12/19 2111 04/13/19 0911 04/14/19 0255  WBC 15.6*  --   --  20.4* 20.1*  RBC 4.18  --   --  4.05 4.43  HGB 12.6   < > 13.9 12.1 13.3  HCT  38.6   < > 41.0 37.7 41.0  MCV 92.3  --   --  93.1 92.6  MCH 30.1  --   --  29.9 30.0  MCHC 32.6  --   --  32.1 32.4  RDW 14.9  --   --  15.7* 15.4  PLT 191  --   --  199 213   < > = values in this interval not displayed.    BNP Recent Labs  Lab 04/11/19 0520  BNP 348.6*     DDimer No results for input(s): DDIMER in the last 168 hours.   Radiology    DG CHEST PORT 1 VIEW  Result Date: 04/12/2019 CLINICAL DATA:  Respiratory distress EXAM: PORTABLE CHEST 1 VIEW COMPARISON:  April 12, 2019 FINDINGS: The patient has been extubated. There are well-positioned left-sided subclavian and IJ central venous catheters. The heart size remains enlarged. There is vascular congestion. Mild interstitial edema is noted. There is no pneumothorax. No focal infiltrate. No acute osseous abnormality IMPRESSION: 1. Lines and tubes as above. 2. Cardiomegaly with improving interstitial edema. Electronically Signed   By: Constance Holster M.D.   On: 04/12/2019 21:15    Cardiac Studies   TTE: 04/11/19  IMPRESSIONS    1. Left ventricular ejection fraction, by visual estimation, is 25 to 30%. The left ventricle has severely decreased function. There is no left ventricular hypertrophy.  2. Mild dyskinesis of the left ventricular, entire apical segment.  3. Severe hypokinesis of the left ventricular, entire anteroseptal wall and  anterior wall.  4. Severe hypokinesis of the left ventricular, entire inferolateral wall.  5. Elevated left atrial pressure.  6. Left ventricular diastolic parameters are consistent with Grade II diastolic dysfunction (pseudonormalization).  7. The left ventricle demonstrates global hypokinesis.  8. Global right ventricle has moderately reduced systolic function.The right ventricular size is normal. No increase in right ventricular wall thickness.  9. Left atrial size was mildly dilated. 10. Right atrial size was normal. 11. Trivial pericardial effusion is present. 12. The  mitral valve is normal in structure. Mild mitral valve regurgitation. 13. The tricuspid valve is normal in structure. 14. The aortic valve is normal in structure. Aortic valve regurgitation is not visualized. 15. The pulmonic valve was not well visualized. Pulmonic valve regurgitation is not visualized. 16. Mildly elevated pulmonary artery systolic pressure. 17. The inferior vena cava is dilated in size with <50% respiratory variability, suggesting right atrial pressure of 15 mmHg. 18. The left ventricular function has critically worse. 19. No intracardiac thrombi or masses were visualized.  Patient Profile     49 y.o. female with a hx of renovascular HTN s/p L RA stent, NSTEMI 2017 w/ 65% LAD rx medically, HTN, breast CA, lymphoma of neck, GERD, Bell's palsy, COPD/ILD,who is being seen today for the evaluation of new CM w/ EF 25% and elevated troponinat the request of Dr Loanne Drilling.Prior exposure to cardiotoxic chemotherapy during Lymphoma and Breast cancer therapy.  Previous nuclear study demonstrated apical infarction 2017.  Assessment & Plan    1. Acute on chronic systolic HF: EF noted at [id number] this admission down from prior 45% in 2017. Etiology is unknown but does have known nonobstructive disease in the dLAD noted in 2017. Also with hx of chemotherapy. Plan for [facility] tomorrow if respiratory status remains stable. Still slightly short of breath this morning.  -- will give additional IV lasix 40mg  x1 today in addition to PO she received. Net -770 cc, weight is up today. -- continue BB, ARB, and plan to add spiro today. May be a good candidate for Bidil -- The patient understands that risks included but are not limited to stroke (1 in 1000), death (1 in 56), kidney failure [usually temporary] (1 in 500), bleeding (1 in 200), allergic reaction [possibly serious] (1 in 200).   2. Hx of CAD: noted to have 50-70% in the dLAD in 2017. Treated with ASA, statin  3. HTN: blood pressures are  still elevated but improved. Adjustments noted above.   4. HL: on high dose statin  5. Hypokalemia: replete  6. Hx of Breast CA  For questions or updates, please contact [facility] Please consult [email or web address] for contact info under   Signed, Reino Bellis, NP  04/14/2019, 10:04 AM

## 2019-04-14 NOTE — Progress Notes (Addendum)
PROGRESS NOTE    [name]    Code Status: Full Code  [id number] DOB: 11/06/1970 DOA: 04/11/2019  PCP: Javier Docker, MD    Hospital Summary  This is a 49 year old female with history of breast cancer in remission, asthma/COPD/ILD who presented with acute hypercarbic respiratory failure which was sudden in onset and subsequently PCCM admitted patient.  She required intubation.  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  He self extubated on 1/18.  And was found to be hypertensive.  Nitro ointment and Catapres were started.  Also was disoriented and pulled central line ICU.  She was on steroids IV which were changed to p.o. prednisone and Plavix were discontinued 1/19 which were started on admission.  Bronchodilators changed to [facility] and Incruse.  Cardiology was consulted for hypertensive emergency as well as worsening/new cardiomyopathy with EF decreased from 45% in 2017 to 25% now.  Patient was PCCM pickup for 04/14/2019 by [facility].  Cardiology has planned for aggressive diuresis as well as possible right/left heart catheterization once clinical status improves.  A & P   Principal Problem:   Acute respiratory failure with hypercapnia (HCC) Active Problems:   Renovascular hypertension   Left renal artery stenosis (HCC)   Hypokalemia   Hypertensive emergency   Malignant neoplasm of overlapping sites of right breast in female, estrogen receptor positive ([place])   Essential (primary) hypertension   Coronary artery disease   Respiratory failure ([place])   Acute on chronic systolic heart failure ([place])   1. Acute hypercarbic respiratory failure secondary to flash pulmonary edema, CAP, COPD/ILD a. Intubated 1/17, self extubated 1/18 b. Off antibiotics c. Continue Incruse, Dulera and albuterol as needed d. Day 4/5 steroids, continue prednisone to  tomorrow 2. Hypertensive emergency resulting in flash pulmonary edema a. Renal ultrasound negative 3. Acute on chronic systolic heart failure, cardiomyopathy a. EF decreased this admission to 25%, down from 45% in 2017 b. Etiology unknown however has history of chemotherapy which may be contributing c. Plan for right/left heart catheterization tomorrow pending status d. Aggressive diuresis per cardiology e. Continue beta-blocker, ARB and spironolactone added 4. Agitation requiring sedation resolved 5. History of CAD on aspirin, statin 6. Hypertension uncontrolled, adjustments as above 7. Hyperlipidemia on statin 8. Hypokalemia pleated, monitor while on spironolactone  DVT prophylaxis: Heparin Diet: N.p.o. after midnight Family Communication: No family at bedside Disposition Plan: Plan for cardiac cath in a.m.  Consultants  Cardiology PCCM  Procedures  Intubated 1/17-1/18  Antibiotics   Anti-infectives (From admission, onward)   Start     Dose/Rate Route Frequency Ordered Stop   04/12/19 0800  cefTRIAXone (ROCEPHIN) 2 g in sodium chloride 0.9 % 100 mL IVPB  Status:  Discontinued     2 g 200 mL/hr over 30 Minutes Intravenous Every 24 hours 04/11/19 1024 04/13/19 1100   04/11/19 0800  cefTRIAXone (ROCEPHIN) 1 g in sodium chloride 0.9 % 100 mL IVPB  Status:  Discontinued     1 g 200 mL/hr over 30 Minutes Intravenous Every 24 hours 04/11/19 0721 04/11/19 1024   04/11/19 0730  azithromycin (ZITHROMAX) 500 mg in sodium chloride 0.9 % 250 mL IVPB  Status:  Discontinued     500 mg 250 mL/hr over 60 Minutes Intravenous Daily 04/11/19 0721 04/13/19 1100           Subjective   Seen and examined at bedside no acute distress  resting comfortably.  States she still has some shortness of breath and admits to wheezing.  Denies any chest pain, nausea or vomiting.  No other complaints at this time  Objective   Vitals:   04/14/19 1244 04/14/19 1348 04/14/19 1517 04/14/19 1632  BP:  (!) 182/112  (!) 142/77 (!) 150/83  Pulse:  85 80 75  Resp:  (!) 23 (!) 31   Temp:    98.1 F (36.7 C)  TempSrc:    Oral  SpO2:  100% 94% 95%  Weight:      Height:        Intake/Output Summary (Last 24 hours) at 04/14/2019 1701 Last data filed at 04/14/2019 1308 Gross per 24 hour  Intake 500 ml  Output 775 ml  Net -275 ml   Filed Weights   04/11/19 0550 04/13/19 0314 04/14/19 0517  Weight: 74.8 kg 74.6 kg 76 kg    Examination:  Physical Exam Vitals and nursing note reviewed.  Constitutional:      Appearance: She is obese. She is not ill-appearing or diaphoretic.  HENT:     Head: Normocephalic.     Mouth/Throat:     Mouth: Mucous membranes are moist.  Eyes:     Conjunctiva/sclera: Conjunctivae normal.  Cardiovascular:     Rate and Rhythm: Normal rate.  Pulmonary:     Effort: Pulmonary effort is normal.     Breath sounds: Wheezing present.     Comments: Diffuse expiratory wheeze Musculoskeletal:        General: No swelling or tenderness.     Cervical back: Normal range of motion.  Skin:    Coloration: Skin is not jaundiced.  Neurological:     Mental Status: She is alert. Mental status is at baseline.  Psychiatric:        Mood and Affect: Mood normal.        Behavior: Behavior normal.     Data Reviewed: I have personally reviewed following labs and imaging studies  CBC: Recent Labs  Lab 04/11/19 0520 04/11/19 0739 04/11/19 0746 04/11/19 0849 04/12/19 0414 04/12/19 1439 04/12/19 2111 04/13/19 0911 04/14/19 0255  WBC 17.3*  --  26.9*  --  15.6*  --   --  20.4* 20.1*  NEUTROABS 6.6  --  23.9*  --   --   --   --   --   --   HGB 14.1   < > 14.4   < > 12.6 14.3 13.9 12.1 13.3  HCT 46.5*   < > 47.0*   < > 38.6 42.0 41.0 37.7 41.0  MCV 99.1  --  97.7  --  92.3  --   --  93.1 92.6  PLT 266  --  276  --  191  --   --  199 213   < > = values in this interval not displayed.   Basic Metabolic Panel: Recent Labs  Lab 04/11/19 0520 04/11/19 0739  04/11/19 0746 04/11/19 0849 04/11/19 1631 04/11/19 1634 04/12/19 0414 04/12/19 1439 04/12/19 2022 04/12/19 2111 04/13/19 0243 04/14/19 0255  NA 141   < >  --    < >   < >  --  140 142  --  143 142 139  K 4.2   < >  --    < >   < >  --  4.1 4.3  --  4.3 3.8 3.3*  CL 103  --   --   --   --   --  110  --   --   --  108 107  CO2 24  --   --   --   --   --  21*  --   --   --  22 23  GLUCOSE 383*  --   --   --   --   --  140*  --   --   --  131* 108*  BUN 6  --   --   --   --   --  20  --   --   --  20 23*  CREATININE 1.19*  --  1.05*  --   --   --  0.93  --   --   --  0.69 0.94  CALCIUM 9.3  --   --   --   --   --  9.3  --   --   --  9.2 10.0  MG 2.3  --  2.3  --   --  1.7 1.7  --  2.3  --   --   --   PHOS  --   --  8.0*  --   --  2.8 2.1*  --  2.6  --  2.6  --    < > = values in this interval not displayed.   GFR: Estimated Creatinine Clearance: 70.7 mL/min (by C-G formula based on SCr of 0.94 mg/dL). Liver Function Tests: Recent Labs  Lab 04/11/19 0520  AST 82*  ALT 46*  ALKPHOS 172*  BILITOT 0.6  PROT 6.5  ALBUMIN 3.6   No results for input(s): LIPASE, AMYLASE in the last 168 hours. No results for input(s): AMMONIA in the last 168 hours. Coagulation Profile: No results for input(s): INR, PROTIME in the last 168 hours. Cardiac Enzymes: No results for input(s): CKTOTAL, CKMB, CKMBINDEX, TROPONINI in the last 168 hours. BNP (last 3 results) No results for input(s): PROBNP in the last 8760 hours. HbA1C: No results for input(s): HGBA1C in the last 72 hours. CBG: Recent Labs  Lab 04/14/19 0009 04/14/19 0431 04/14/19 0739 04/14/19 1143 04/14/19 1612  GLUCAP 112* 107* 94 111* 126*   Lipid Profile: No results for input(s): CHOL, HDL, LDLCALC, TRIG, CHOLHDL, LDLDIRECT in the last 72 hours. Thyroid Function Tests: No results for input(s): TSH, T4TOTAL, FREET4, T3FREE, THYROIDAB in the last 72 hours. Anemia Panel: No results for input(s): VITAMINB12, FOLATE, FERRITIN,  TIBC, IRON, RETICCTPCT in the last 72 hours. Sepsis Labs: Recent Labs  Lab 04/11/19 0746  PROCALCITON 1.22    Recent Results (from the past 240 hour(s))  Respiratory Panel by RT PCR (Flu A&B, Covid) - Nasopharyngeal Swab     Status: None   Collection Time: 04/11/19  5:24 AM   Specimen: Nasopharyngeal Swab  Result Value Ref Range Status   SARS Coronavirus 2 by RT PCR NEGATIVE NEGATIVE Final    Comment: (NOTE) SARS-CoV-2 target nucleic acids are NOT DETECTED. The SARS-CoV-2 RNA is generally detectable in upper respiratoy specimens during the acute phase of infection. The lowest concentration of SARS-CoV-2 viral copies this assay can detect is 131 copies/mL. A negative result does not preclude SARS-Cov-2 infection and should not be used as the sole basis for treatment or other patient management decisions. A negative result may occur with  improper specimen collection/handling, submission of specimen other than nasopharyngeal swab, presence of viral mutation(s) within the areas targeted by this assay, and inadequate number of viral copies (<131 copies/mL). A negative result must be combined  with clinical observations, patient history, and epidemiological information. The expected result is Negative. Fact Sheet for Patients:  PinkCheek.be Fact Sheet for Healthcare Providers:  GravelBags.it This test is not yet ap proved or cleared by the [place] FDA and  has been authorized for detection and/or diagnosis of SARS-CoV-2 by FDA under an Emergency Use Authorization (EUA). This EUA will remain  in effect (meaning this test can be used) for the duration of the COVID-19 declaration under Section 564(b)(1) of the Act, 21 U.S.C. section 360bbb-3(b)(1), unless the authorization is terminated or revoked sooner.    Influenza A by PCR NEGATIVE NEGATIVE Final   Influenza B by PCR NEGATIVE NEGATIVE Final    Comment: (NOTE) The  Xpert Xpress SARS-CoV-2/FLU/RSV assay is intended as an aid in  the diagnosis of influenza from Nasopharyngeal swab specimens and  should not be used as a sole basis for treatment. Nasal washings and  aspirates are unacceptable for Xpert Xpress SARS-CoV-2/FLU/RSV  testing. Fact Sheet for Patients: PinkCheek.be Fact Sheet for Healthcare Providers: GravelBags.it This test is not yet approved or cleared by the [place] FDA and  has been authorized for detection and/or diagnosis of SARS-CoV-2 by  FDA under an Emergency Use Authorization (EUA). This EUA will remain  in effect (meaning this test can be used) for the duration of the  Covid-19 declaration under Section 564(b)(1) of the Act, 21  U.S.C. section 360bbb-3(b)(1), unless the authorization is  terminated or revoked. Performed at [facility], [place] [address] [place], [place] [address]   Urine culture     Status: None   Collection Time: 04/11/19  6:13 AM   Specimen: Urine, Catheterized  Result Value Ref Range Status   Specimen Description URINE, CATHETERIZED  Final   Special Requests NONE  Final   Culture   Final    NO GROWTH Performed at [facility], [address] [place], [place] [address]    Report Status 04/12/2019 FINAL  Final  MRSA PCR Screening     Status: None   Collection Time: 04/11/19  9:29 AM   Specimen: Nasal Mucosa; Nasopharyngeal  Result Value Ref Range Status   MRSA by PCR NEGATIVE NEGATIVE Final    Comment:        The GeneXpert MRSA Assay (FDA approved for NASAL specimens only), is one component of a comprehensive MRSA colonization surveillance program. It is not intended to diagnose MRSA infection nor to guide or monitor treatment for MRSA infections. Performed at [facility], [place] [address], [place], [place] [address]   Culture, respiratory (non-expectorated)     Status: None   Collection Time: 04/11/19 11:26 AM    Specimen: Tracheal Aspirate; Respiratory  Result Value Ref Range Status   Specimen Description TRACHEAL ASPIRATE  Final   Special Requests NONE  Final   Gram Stain   Final    MODERATE WBC PRESENT,BOTH PMN AND MONONUCLEAR MODERATE GRAM POSITIVE COCCI IN PAIRS AND CHAINS FEW GRAM POSITIVE RODS RARE GRAM NEGATIVE RODS    Culture   Final    Consistent with normal respiratory flora. Performed at [facility], [place] [address], [place], [place] [address]    Report Status 04/13/2019 FINAL  Final         Radiology Studies: DG CHEST PORT 1 VIEW  Result Date: 04/12/2019 CLINICAL DATA:  Respiratory distress EXAM: PORTABLE CHEST 1 VIEW COMPARISON:  April 12, 2019 FINDINGS: The patient has been extubated. There are well-positioned left-sided subclavian  and IJ central venous catheters. The heart size remains enlarged. There is vascular congestion. Mild interstitial edema is noted. There is no pneumothorax. No focal infiltrate. No acute osseous abnormality IMPRESSION: 1. Lines and tubes as above. 2. Cardiomegaly with improving interstitial edema. Electronically Signed   By: Constance Holster M.D.   On: 04/12/2019 21:15   VAS US RENAL ARTERY DUPLEX  Result Date: 04/14/2019 ABDOMINAL VISCERAL High Risk Factors: Hypertension, hyperlipidemia. Vascular Interventions: Left renal stent. Limitations: Air/bowel gas, obesity and Coughing. Unable to hold respiration. Performing Technologist: Baldwin Crown ARDMS, RVT  Examination Guidelines: A complete evaluation includes B-mode imaging, spectral Doppler, color Doppler, and power Doppler as needed of all accessible portions of each vessel. Bilateral testing is considered an integral part of a complete examination. Limited examinations for reoccurring indications may be performed as noted.  Duplex Findings: +--------------------+--------+--------+------+--------------+ Mesenteric          PSV cm/sEDV cm/sPlaque   Comments     +--------------------+--------+--------+------+--------------+ Aorta Mid              95      18                        +--------------------+--------+--------+------+--------------+ Celiac Artery Origin  120      35                        +--------------------+--------+--------+------+--------------+ SMA Origin                                Not visualized +--------------------+--------+--------+------+--------------+    +------------------+--------+--------+-------+ Right Renal ArteryPSV cm/sEDV cm/sComment +------------------+--------+--------+-------+ Origin               74      22           +------------------+--------+--------+-------+ Proximal             91      21           +------------------+--------+--------+-------+ Mid                  54      16           +------------------+--------+--------+-------+ Distal               40      10           +------------------+--------+--------+-------+ +-----------------+--------+--------+-------+ Left Renal ArteryPSV cm/sEDV cm/sComment +-----------------+--------+--------+-------+ Origin             105      26           +-----------------+--------+--------+-------+ Proximal           101      31           +-----------------+--------+--------+-------+ Mid                 48      10           +-----------------+--------+--------+-------+ Distal              23      9            +-----------------+--------+--------+-------+  Technologist observations: Left renal stent not visualized. +------------+--------+--------+----+-----------+--------+--------+----+ Right KidneyPSV cm/sEDV cm/sRI  Left KidneyPSV cm/sEDV cm/sRI   +------------+--------+--------+----+-----------+--------+--------+----+ Upper Pole  34      13      0.63Upper Pole  39      13      0.66 +------------+--------+--------+----+-----------+--------+--------+----+ Mid         25      8       0.68Mid        36       11      0.68 +------------+--------+--------+----+-----------+--------+--------+----+ Lower Pole  25      8       0.68Lower Pole 31      12      0.62 +------------+--------+--------+----+-----------+--------+--------+----+ Hilar       37      11      0.70Hilar      30      10      0.67 +------------+--------+--------+----+-----------+--------+--------+----+ +------------------+----+------------------+----+ Right Kidney          Left Kidney            +------------------+----+------------------+----+ RAR                   RAR                    +------------------+----+------------------+----+ RAR (manual)          RAR (manual)           +------------------+----+------------------+----+ Cortex                Cortex                 +------------------+----+------------------+----+ Cortex thickness      Corex thickness        +------------------+----+------------------+----+ Kidney length (cm)9.80Kidney length (cm)9.40 +------------------+----+------------------+----+  Summary: Renal:  Right: Normal size right kidney. No evidence of right renal artery        stenosis. Left:  Normal size of left kidney. No evidence of left renal artery        stenosis. Renal stent not visualized.  *See table(s) above for measurements and observations.  Diagnosing physician: Curt Jews MD  Electronically signed by Curt Jews MD on 04/14/2019 at 3:04:30 PM.    Final         Scheduled Meds: . Derrill Memo ON 04/15/2019] anastrozole  1 mg Oral Daily  . aspirin  81 mg Per Tube Daily  . atorvastatin  80 mg Per Tube q1800  . [START ON 04/15/2019] famotidine  20 mg Oral Daily  . furosemide  40 mg Oral Daily  . heparin  5,000 Units Subcutaneous Q8H  . insulin aspart  0-20 Units Subcutaneous Q4H  . ipratropium-albuterol  3 mL Nebulization Q6H  . isosorbide mononitrate  60 mg Oral Daily  . [START ON 04/15/2019] loratadine  10 mg Oral Daily  . losartan  50 mg Oral Daily  . mouth rinse  15  mL Mouth Rinse BID  . metoprolol succinate  50 mg Oral BID  . predniSONE  50 mg Oral Q breakfast  . sodium chloride flush  3 mL Intravenous Q12H  . spironolactone  25 mg Oral Daily  . venlafaxine XR  75 mg Oral Q breakfast   Continuous Infusions:   LOS: 3 days    Time spent: 25 minutes with over 50% of the time coordinating the patient's care    Harold Hedge, DO [facility] Pager [phone]  If 7PM-7AM, please contact night-coverage [email or web address] Password [facility] 04/14/2019, 5:01 PM

## 2019-04-14 NOTE — H&P (View-Only) (Signed)
The patient has been seen in conjunction with Reino Bellis, NP. All aspects of care have been considered and discussed. The patient has been personally interviewed, examined, and all clinical data has been reviewed.   She feels short of breath this morning.  There is a component of orthopnea.  She has decreased breath sounds with faint wheezing.  Blood pressure is extremely elevated.  Plan more aggressive diuresis today, following creatinine and potassium.  Possible right and left heart cath tomorrow depending upon clinical status and kidney function.  No evidence RAS by duplex.   Progress Note  Patient Name: [name] Date of Encounter: 04/14/2019  Primary Cardiologist: Shelva Majestic, MD   Subjective   No complaints this morning.   Inpatient Medications    Scheduled Meds: . [START ON 04/15/2019] anastrozole  1 mg Oral Daily  . aspirin  81 mg Per Tube Daily  . atorvastatin  80 mg Per Tube q1800  . [START ON 04/15/2019] famotidine  20 mg Oral Daily  . furosemide  40 mg Oral Daily  . heparin  5,000 Units Subcutaneous Q8H  . insulin aspart  0-20 Units Subcutaneous Q4H  . ipratropium-albuterol  3 mL Nebulization TID  . isosorbide mononitrate  60 mg Oral Daily  . [START ON 04/15/2019] loratadine  10 mg Oral Daily  . losartan  50 mg Oral Daily  . mouth rinse  15 mL Mouth Rinse BID  . metoprolol succinate  50 mg Oral BID  . potassium chloride  20 mEq Oral Once  . predniSONE  50 mg Oral Q breakfast  . spironolactone  25 mg Oral Daily  . venlafaxine XR  75 mg Oral Q breakfast   Continuous Infusions:  PRN Meds: acetaminophen (TYLENOL) oral liquid 160 mg/5 mL, acetaminophen, hydrALAZINE, metoCLOPramide (REGLAN) injection, nitroGLYCERIN   Vital Signs    Vitals:   04/14/19 0600 04/14/19 0732 04/14/19 0834 04/14/19 0947  BP:  (!) 156/90 (!) 160/87   Pulse: 83 82 85 81  Resp: (!) 27 (!) 26    Temp:   98.8 F (37.1 C)   TempSrc:   Oral   SpO2: 98% 98% 100%    Weight:      Height:        Intake/Output Summary (Last 24 hours) at 04/14/2019 1004 Last data filed at 04/14/2019 0500 Gross per 24 hour  Intake -  Output 1350 ml  Net -1350 ml   Last 3 Weights 04/14/2019 04/13/2019 04/11/2019  Weight (lbs) 167 lb 8 oz 164 lb 7.4 oz 165 lb  Weight (kg) 75.978 kg 74.6 kg 74.844 kg      Telemetry    SR with PVCs - Personally Reviewed  ECG    No new tracing this morning.  Physical Exam  AAF, laying in bed. GEN: No acute distress.   Neck: No JVD Cardiac: RRR, no murmurs, rubs, or gallops.  Respiratory: Mild crackles at bases.  GI: Soft, nontender, non-distended  MS: No edema; No deformity. Neuro:  Nonfocal  Psych: Normal affect   Labs    High Sensitivity Troponin:   Recent Labs  Lab 04/11/19 0520 04/11/19 0746 04/12/19 0746 04/12/19 1435  TROPONINIHS 26* 50* 21* 26*      Chemistry Recent Labs  Lab 04/11/19 0520 04/11/19 0739 04/12/19 0414 04/12/19 1439 04/12/19 2111 04/13/19 0243 04/14/19 0255  NA 141   < > 140   < > 143 142 139  K 4.2   < > 4.1   < > 4.3  3.8 3.3*  CL 103   < > 110  --   --  108 107  CO2 24   < > 21*  --   --  22 23  GLUCOSE 383*   < > 140*  --   --  131* 108*  BUN 6   < > 20  --   --  20 23*  CREATININE 1.19*   < > 0.93  --   --  0.69 0.94  CALCIUM 9.3   < > 9.3  --   --  9.2 10.0  PROT 6.5  --   --   --   --   --   --   ALBUMIN 3.6  --   --   --   --   --   --   AST 82*  --   --   --   --   --   --   ALT 46*  --   --   --   --   --   --   ALKPHOS 172*  --   --   --   --   --   --   BILITOT 0.6  --   --   --   --   --   --   GFRNONAA 54*   < > >60  --   --  >60 >60  GFRAA >60   < > >60  --   --  >60 >60  ANIONGAP 14   < > 9  --   --  12 9   < > = values in this interval not displayed.     Hematology Recent Labs  Lab 04/12/19 0414 04/12/19 1439 04/12/19 2111 04/13/19 0911 04/14/19 0255  WBC 15.6*  --   --  20.4* 20.1*  RBC 4.18  --   --  4.05 4.43  HGB 12.6   < > 13.9 12.1 13.3  HCT  38.6   < > 41.0 37.7 41.0  MCV 92.3  --   --  93.1 92.6  MCH 30.1  --   --  29.9 30.0  MCHC 32.6  --   --  32.1 32.4  RDW 14.9  --   --  15.7* 15.4  PLT 191  --   --  199 213   < > = values in this interval not displayed.    BNP Recent Labs  Lab 04/11/19 0520  BNP 348.6*     DDimer No results for input(s): DDIMER in the last 168 hours.   Radiology    DG CHEST PORT 1 VIEW  Result Date: 04/12/2019 CLINICAL DATA:  Respiratory distress EXAM: PORTABLE CHEST 1 VIEW COMPARISON:  April 12, 2019 FINDINGS: The patient has been extubated. There are well-positioned left-sided subclavian and IJ central venous catheters. The heart size remains enlarged. There is vascular congestion. Mild interstitial edema is noted. There is no pneumothorax. No focal infiltrate. No acute osseous abnormality IMPRESSION: 1. Lines and tubes as above. 2. Cardiomegaly with improving interstitial edema. Electronically Signed   By: Constance Holster M.D.   On: 04/12/2019 21:15    Cardiac Studies   TTE: 04/11/19  IMPRESSIONS    1. Left ventricular ejection fraction, by visual estimation, is 25 to 30%. The left ventricle has severely decreased function. There is no left ventricular hypertrophy.  2. Mild dyskinesis of the left ventricular, entire apical segment.  3. Severe hypokinesis of the left ventricular, entire anteroseptal wall and anterior  wall.  4. Severe hypokinesis of the left ventricular, entire inferolateral wall.  5. Elevated left atrial pressure.  6. Left ventricular diastolic parameters are consistent with Grade II diastolic dysfunction (pseudonormalization).  7. The left ventricle demonstrates global hypokinesis.  8. Global right ventricle has moderately reduced systolic function.The right ventricular size is normal. No increase in right ventricular wall thickness.  9. Left atrial size was mildly dilated. 10. Right atrial size was normal. 11. Trivial pericardial effusion is present. 12. The  mitral valve is normal in structure. Mild mitral valve regurgitation. 13. The tricuspid valve is normal in structure. 14. The aortic valve is normal in structure. Aortic valve regurgitation is not visualized. 15. The pulmonic valve was not well visualized. Pulmonic valve regurgitation is not visualized. 16. Mildly elevated pulmonary artery systolic pressure. 17. The inferior vena cava is dilated in size with <50% respiratory variability, suggesting right atrial pressure of 15 mmHg. 18. The left ventricular function has critically worse. 19. No intracardiac thrombi or masses were visualized.  Patient Profile     49 y.o. female with a hx of renovascular HTN s/p L RA stent, NSTEMI 2017 w/ 65% LAD rx medically, HTN, breast CA, lymphoma of neck, GERD, Bell's palsy, COPD/ILD,who is being seen today for the evaluation of new CM w/ EF 25% and elevated troponinat the request of Dr Loanne Drilling.Prior exposure to cardiotoxic chemotherapy during Lymphoma and Breast cancer therapy.  Previous nuclear study demonstrated apical infarction 2017.  Assessment & Plan    1. Acute on chronic systolic HF: EF noted at 123XX123 this admission down from prior 45% in 2017. Etiology is unknown but does have known nonobstructive disease in the dLAD noted in 2017. Also with hx of chemotherapy. Plan for [facility] tomorrow if respiratory status remains stable. Still slightly short of breath this morning.  -- will give additional IV lasix 40mg  x1 today in addition to PO she received. Net -770 cc, weight is up today. -- continue BB, ARB, and plan to add spiro today. May be a good candidate for Bidil -- The patient understands that risks included but are not limited to stroke (1 in 1000), death (1 in 47), kidney failure [usually temporary] (1 in 500), bleeding (1 in 200), allergic reaction [possibly serious] (1 in 200).   2. Hx of CAD: noted to have 50-70% in the dLAD in 2017. Treated with ASA, statin  3. HTN: blood pressures are  still elevated but improved. Adjustments noted above.   4. HL: on high dose statin  5. Hypokalemia: replete  6. Hx of Breast CA  For questions or updates, please contact [facility] Please consult [email or web address] for contact info under   Signed, Reino Bellis, NP  04/14/2019, 10:04 AM

## 2019-04-14 NOTE — Progress Notes (Signed)
Renal Artery duplex completed.  Preliminary results can be found under CV proc under chart review.  04/14/2019 11:59 AM  Teaira Croft, K., RDMS, RVT

## 2019-04-14 NOTE — Progress Notes (Signed)
eLink Physician-Brief Progress Note Patient Name: [name] DOB: December 01, 1970 MRN: [id number]   Date of Service  04/14/2019  HPI/Events of Note  K+ 3.3  eICU Interventions  KCL 10 meq iv Q 1 hour x 2        [name] 04/14/2019, 6:52 AM

## 2019-04-15 ENCOUNTER — Encounter (HOSPITAL_COMMUNITY)
Admission: EM | Disposition: A | Discharge status: Home / Self Care | Payer: Self-pay | Source: Home / Self Care | Attending: Internal Medicine

## 2019-04-15 DIAGNOSIS — I10 Essential (primary) hypertension: Secondary | ICD-10-CM

## 2019-04-15 HISTORY — PX: RIGHT/LEFT HEART CATH AND CORONARY ANGIOGRAPHY: CATH118266

## 2019-04-15 LAB — POCT I-STAT EG7
Acid-Base Excess: 3 mmol/L — ABNORMAL HIGH (ref 0.0–2.0)
Bicarbonate: 26.8 mmol/L (ref 20.0–28.0)
Calcium, Ion: 1.28 mmol/L (ref 1.15–1.40)
HCT: 40 % (ref 36.0–46.0)
Hemoglobin: 13.6 g/dL (ref 12.0–15.0)
O2 Saturation: 64 %
Potassium: 3.3 mmol/L — ABNORMAL LOW (ref 3.5–5.1)
Sodium: 140 mmol/L (ref 135–145)
TCO2: 28 mmol/L (ref 22–32)
pCO2, Ven: 35.3 mmHg — ABNORMAL LOW (ref 44.0–60.0)
pH, Ven: 7.487 — ABNORMAL HIGH (ref 7.250–7.430)
pO2, Ven: 30 mmHg — CL (ref 32.0–45.0)

## 2019-04-15 LAB — CBC
HCT: 40.5 % (ref 36.0–46.0)
Hemoglobin: 13.3 g/dL (ref 12.0–15.0)
MCH: 29.9 pg (ref 26.0–34.0)
MCHC: 32.8 g/dL (ref 30.0–36.0)
MCV: 91 fL (ref 80.0–100.0)
Platelets: 220 10*3/uL (ref 150–400)
RBC: 4.45 MIL/uL (ref 3.87–5.11)
RDW: 14.8 % (ref 11.5–15.5)
WBC: 15.7 10*3/uL — ABNORMAL HIGH (ref 4.0–10.5)
nRBC: 0 % (ref 0.0–0.2)

## 2019-04-15 LAB — BASIC METABOLIC PANEL
Anion gap: 14 (ref 5–15)
BUN: 23 mg/dL — ABNORMAL HIGH (ref 6–20)
CO2: 22 mmol/L (ref 22–32)
Calcium: 9.9 mg/dL (ref 8.9–10.3)
Chloride: 103 mmol/L (ref 98–111)
Creatinine, Ser: 0.9 mg/dL (ref 0.44–1.00)
GFR calc Af Amer: >60 mL/min (ref >60)
GFR calc non Af Amer: >60 mL/min (ref >60)
Glucose, Bld: 96 mg/dL (ref 70–99)
Potassium: 3.7 mmol/L (ref 3.5–5.1)
Sodium: 139 mmol/L (ref 135–145)

## 2019-04-15 LAB — POCT I-STAT 7, (LYTES, BLD GAS, ICA,H+H)
Acid-Base Excess: 2 mmol/L (ref 0.0–2.0)
Bicarbonate: 24.9 mmol/L (ref 20.0–28.0)
Calcium, Ion: 1.29 mmol/L (ref 1.15–1.40)
HCT: 41 % (ref 36.0–46.0)
Hemoglobin: 13.9 g/dL (ref 12.0–15.0)
O2 Saturation: 91 %
Potassium: 3.3 mmol/L — ABNORMAL LOW (ref 3.5–5.1)
Sodium: 140 mmol/L (ref 135–145)
TCO2: 26 mmol/L (ref 22–32)
pCO2 arterial: 31.6 mmHg — ABNORMAL LOW (ref 32.0–48.0)
pH, Arterial: 7.505 — ABNORMAL HIGH (ref 7.350–7.450)
pO2, Arterial: 54 mmHg — ABNORMAL LOW (ref 83.0–108.0)

## 2019-04-15 LAB — GLUCOSE, CAPILLARY
Glucose-Capillary: 100 mg/dL — ABNORMAL HIGH (ref 70–99)
Glucose-Capillary: 108 mg/dL — ABNORMAL HIGH (ref 70–99)
Glucose-Capillary: 130 mg/dL — ABNORMAL HIGH (ref 70–99)
Glucose-Capillary: 155 mg/dL — ABNORMAL HIGH (ref 70–99)
Glucose-Capillary: 92 mg/dL (ref 70–99)

## 2019-04-15 LAB — MAGNESIUM: Magnesium: 2 mg/dL (ref 1.7–2.4)

## 2019-04-15 SURGERY — RIGHT/LEFT HEART CATH AND CORONARY ANGIOGRAPHY
Anesthesia: LOCAL

## 2019-04-15 MED ORDER — HEPARIN (PORCINE) IN NACL 1000-0.9 UT/500ML-% IV SOLN
INTRAVENOUS | Status: DC | PRN
  Administered 2019-04-15 (×2): 500 mL

## 2019-04-15 MED ORDER — ALBUTEROL SULFATE HFA 108 (90 BASE) MCG/ACT IN AERS: 2.0000 | INHALATION_SPRAY | Freq: Four times a day (QID) | RESPIRATORY_TRACT | Status: DC | PRN

## 2019-04-15 MED ORDER — ATORVASTATIN CALCIUM 80 MG PO TABS: 80.0000 mg | ORAL_TABLET | Freq: Every day | ORAL | Status: DC

## 2019-04-15 MED ORDER — ISOSORBIDE MONONITRATE ER 60 MG PO TB24: 60.0000 mg | ORAL_TABLET | Freq: Every day | ORAL | Status: DC

## 2019-04-15 MED ORDER — HYDRALAZINE HCL 20 MG/ML IJ SOLN: 10.0000 mg | INTRAMUSCULAR | Status: AC | PRN

## 2019-04-15 MED ORDER — ACETAMINOPHEN 325 MG PO TABS: 650.0000 mg | ORAL_TABLET | ORAL | Status: DC | PRN

## 2019-04-15 MED ORDER — SACUBITRIL-VALSARTAN 24-26 MG PO TABS
1.0000 | ORAL_TABLET | Freq: Two times a day (BID) | ORAL | Status: DC
  Administered 2019-04-15: 22:00:00 1 via ORAL
  Filled 2019-04-15 (×2): qty 1

## 2019-04-15 MED ORDER — ASPIRIN 81 MG PO CHEW: 81.0000 mg | CHEWABLE_TABLET | Freq: Every day | ORAL | Status: DC

## 2019-04-15 MED ORDER — LABETALOL HCL 5 MG/ML IV SOLN: 10.0000 mg | INTRAVENOUS | Status: AC | PRN

## 2019-04-15 MED ORDER — HYDRALAZINE HCL 25 MG PO TABS
25.0000 mg | ORAL_TABLET | Freq: Three times a day (TID) | ORAL | Status: DC
  Filled 2019-04-15: qty 1

## 2019-04-15 MED ORDER — IOHEXOL 350 MG/ML SOLN
INTRAVENOUS | Status: DC | PRN
  Administered 2019-04-15: 70 mL via INTRA_ARTERIAL

## 2019-04-15 MED ORDER — FUROSEMIDE 20 MG PO TABS
20.0000 mg | ORAL_TABLET | Freq: Every day | ORAL | Status: DC
  Administered 2019-04-16: 10:00:00 20 mg via ORAL
  Filled 2019-04-15: qty 1

## 2019-04-15 MED ORDER — ANASTROZOLE 1 MG PO TABS: 1.0000 mg | ORAL_TABLET | Freq: Every day | ORAL | Status: DC

## 2019-04-15 MED ORDER — MORPHINE SULFATE (PF) 2 MG/ML IV SOLN: 2.0000 mg | INTRAVENOUS | Status: DC | PRN

## 2019-04-15 MED ORDER — VENLAFAXINE HCL ER 75 MG PO CP24: 75.0000 mg | ORAL_CAPSULE | Freq: Every day | ORAL | Status: DC

## 2019-04-15 MED ORDER — SODIUM CHLORIDE 0.9 % IV SOLN: 250.0000 mL | INTRAVENOUS | Status: DC | PRN

## 2019-04-15 MED ORDER — HEPARIN (PORCINE) IN NACL 1000-0.9 UT/500ML-% IV SOLN
INTRAVENOUS | Status: AC
  Filled 2019-04-15: qty 1000

## 2019-04-15 MED ORDER — HYDRALAZINE HCL 20 MG/ML IJ SOLN
INTRAMUSCULAR | Status: AC
  Filled 2019-04-15: qty 1

## 2019-04-15 MED ORDER — SODIUM CHLORIDE 0.9% FLUSH
3.0000 mL | Freq: Two times a day (BID) | INTRAVENOUS | Status: DC
  Administered 2019-04-15 (×2): 3 mL via INTRAVENOUS

## 2019-04-15 MED ORDER — ONDANSETRON HCL 4 MG/2ML IJ SOLN: 4.0000 mg | Freq: Four times a day (QID) | INTRAMUSCULAR | Status: DC | PRN

## 2019-04-15 MED ORDER — HYDRALAZINE HCL 20 MG/ML IJ SOLN
INTRAMUSCULAR | Status: DC | PRN
  Administered 2019-04-15: 10 mg via INTRAVENOUS

## 2019-04-15 MED ORDER — ALBUTEROL SULFATE (2.5 MG/3ML) 0.083% IN NEBU: 2.5000 mg | INHALATION_SOLUTION | Freq: Four times a day (QID) | RESPIRATORY_TRACT | Status: DC | PRN

## 2019-04-15 MED ORDER — HYDRALAZINE HCL 20 MG/ML IJ SOLN: 10.0000 mg | Freq: Four times a day (QID) | INTRAMUSCULAR | Status: DC | PRN

## 2019-04-15 MED ORDER — LIDOCAINE HCL (PF) 1 % IJ SOLN
INTRAMUSCULAR | Status: DC | PRN
  Administered 2019-04-15: 15 mL via INTRADERMAL

## 2019-04-15 MED ORDER — LIDOCAINE HCL (PF) 1 % IJ SOLN
INTRAMUSCULAR | Status: AC
  Filled 2019-04-15: qty 30

## 2019-04-15 MED ORDER — MOMETASONE FURO-FORMOTEROL FUM 200-5 MCG/ACT IN AERO: 2.0000 | INHALATION_SPRAY | Freq: Two times a day (BID) | RESPIRATORY_TRACT | Status: DC

## 2019-04-15 MED ORDER — METOPROLOL SUCCINATE ER 100 MG PO TB24: 100.0000 mg | ORAL_TABLET | Freq: Every day | ORAL | Status: DC

## 2019-04-15 MED ORDER — SODIUM CHLORIDE 0.9% FLUSH: 3.0000 mL | INTRAVENOUS | Status: DC | PRN

## 2019-04-15 MED ORDER — SODIUM CHLORIDE 0.9 % IV SOLN: INTRAVENOUS | Status: AC

## 2019-04-15 SURGICAL SUPPLY — 13 items
CATH DXT MULTI JL4 JR4 ANG PIG (CATHETERS) ×1 IMPLANT
CATH INFINITI 5 FR IM (CATHETERS) ×1 IMPLANT
CATH SWAN GANZ 7F STRAIGHT (CATHETERS) ×1 IMPLANT
CLOSURE MYNX CONTROL 5F (Vascular Products) ×1 IMPLANT
CLOSURE MYNX CONTROL 6F/7F (Vascular Products) ×1 IMPLANT
KIT HEART LEFT (KITS) ×2 IMPLANT
PACK CARDIAC CATHETERIZATION (CUSTOM PROCEDURE TRAY) ×2 IMPLANT
SHEATH PINNACLE 5F 10CM (SHEATH) ×1 IMPLANT
SHEATH PINNACLE 7F 10CM (SHEATH) ×1 IMPLANT
SYR MEDRAD MARK 7 150ML (SYRINGE) ×2 IMPLANT
TRANSDUCER W/STOPCOCK (MISCELLANEOUS) ×2 IMPLANT
TUBING CIL FLEX 10 FLL-RA (TUBING) ×2 IMPLANT
WIRE EMERALD 3MM-J .035X150CM (WIRE) ×1 IMPLANT

## 2019-04-15 NOTE — Progress Notes (Addendum)
The patient has been seen in conjunction with Reino Bellis, NP. All aspects of care have been considered and discussed. The patient has been personally interviewed, examined, and all clinical data has been reviewed.   Reviewed cath results with Dr. Gwenlyn Found.  Hemodynamics are good.  Coronaries are widely patent.  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  She seems to be euvolemic.  With the addition of Entresto, will discontinue hydralazine and isosorbide.  Guideline directed therapy for systolic heart failure should include beta-blocker, ARB, ARNI, and mineralocorticoid receptor antagonist.  A.m. labs today suggest that potassium and kidney function are normal.  BP control to consistently less than 140/90 mmHg is important prior to discharge.   Progress Note  Patient Name: [name] Date of Encounter: 04/15/2019  Primary Cardiologist: Shelva Majestic, MD   Subjective   Just returned from cath. Feeling well. Breathing is much improved this morning.   Inpatient Medications    Scheduled Meds: . anastrozole  1 mg Oral Daily  . aspirin  81 mg Per Tube Daily  . atorvastatin  80 mg Per Tube q1800  . famotidine  20 mg Oral Daily  . furosemide  40 mg Oral Daily  . heparin  5,000 Units Subcutaneous Q8H  . insulin aspart  0-20 Units Subcutaneous Q4H  . isosorbide mononitrate  60 mg Oral Daily  . loratadine  10 mg Oral Daily  . losartan  50 mg Oral Daily  . mouth rinse  15 mL Mouth Rinse BID  . metoprolol succinate  50 mg Oral BID  . mometasone-formoterol  2 puff Inhalation BID  . predniSONE  50 mg Oral Q breakfast  . sodium chloride flush  3 mL Intravenous Q12H  . sodium chloride flush  3 mL Intravenous Q12H  . spironolactone  25 mg Oral Daily  . umeclidinium bromide  1 puff Inhalation Daily  . venlafaxine XR  75 mg Oral Q breakfast   Continuous Infusions: . sodium chloride  75 mL/hr at 04/15/19 0903  . sodium chloride     PRN Meds: sodium chloride, acetaminophen (TYLENOL) oral liquid 160 mg/5 mL, acetaminophen, albuterol, hydrALAZINE, hydrALAZINE, labetalol, metoCLOPramide (REGLAN) injection, morphine injection, nitroGLYCERIN, sodium chloride flush   Vital Signs    Vitals:   04/15/19 0830 04/15/19 0835 04/15/19 0840 04/15/19 0900  BP: (!) 187/92 (!) 181/86 (!) 156/92   Pulse: 75 64 82 (!) 101  Resp: 12 13 19  (!) 27  Temp:      TempSrc:      SpO2: 90% 92% 94% 92%  Weight:      Height:        Intake/Output Summary (Last 24 hours) at 04/15/2019 0928 Last data filed at 04/15/2019 [id number] Gross per 24 hour  Intake 730 ml  Output 575 ml  Net 155 ml   Last 3 Weights 04/15/2019 04/14/2019 04/13/2019  Weight (lbs) 164 lb 10.9 oz 167 lb 8 oz 164 lb 7.4 oz  Weight (kg) 74.7 kg 75.978 kg 74.6 kg      Telemetry    SR - Personally Reviewed  ECG    No new tracing this morning.  Physical Exam  Pleasant younger AAF, laying in bed. GEN: No acute distress.   Neck: No JVD Cardiac: RRR, no murmurs, rubs, or gallops.  Respiratory: Very mild wheeze GI: Soft, nontender, non-distended  MS: No edema; No deformity. Right femoral cath site stable. (Mynx)  Neuro:  Nonfocal  Psych: Normal affect   Labs    High Sensitivity Troponin:   Recent Labs  Lab 04/11/19 0520 04/11/19 0746 04/12/19 0746 04/12/19 1435  TROPONINIHS 26* 50* 21* 26*      Chemistry Recent Labs  Lab 04/11/19 0520 04/11/19 0739 04/13/19 0243 04/13/19 0243 04/14/19 0255 04/15/19 0324 04/15/19 0816  NA 141   < > 142   < > 139 139 140  140  K 4.2   < > 3.8   < > 3.3* 3.7 3.3*  3.3*  CL 103   < > 108  --  107 103  --   CO2 24   < > 22  --  23 22  --   GLUCOSE 383*   < > 131*  --  108* 96  --   BUN 6   < > 20  --  23* 23*  --   CREATININE 1.19*   < > 0.69  --  0.94 0.90  --   CALCIUM 9.3   < > 9.2  --  10.0 9.9  --   PROT 6.5  --   --   --   --   --   --   ALBUMIN 3.6  --   --   --    --   --   --   AST 82*  --   --   --   --   --   --   ALT 46*  --   --   --   --   --   --   ALKPHOS 172*  --   --   --   --   --   --   BILITOT 0.6  --   --   --   --   --   --   GFRNONAA 54*   < > >60  --  >60 >60  --   GFRAA >60   < > >60  --  >60 >60  --   ANIONGAP 14   < > 12  --  9 14  --    < > = values in this interval not displayed.     Hematology Recent Labs  Lab 04/13/19 0911 04/13/19 0911 04/14/19 0255 04/15/19 0324 04/15/19 0816  WBC 20.4*  --  20.1* 15.7*  --   RBC 4.05  --  4.43 4.45  --   HGB 12.1   < > 13.3 13.3 13.6  13.9  HCT 37.7   < > 41.0 40.5 40.0  41.0  MCV 93.1  --  92.6 91.0  --   MCH 29.9  --  30.0 29.9  --   MCHC 32.1  --  32.4 32.8  --   RDW 15.7*  --  15.4 14.8  --   PLT 199  --  213 220  --    < > = values in this interval not displayed.    BNP Recent Labs  Lab 04/11/19 0520  BNP 348.6*     DDimer No results for input(s): DDIMER in the last 168 hours.   Radiology    CARDIAC CATHETERIZATION  Result Date: 04/15/2019 [name] is a 49 y.o. female  [id number] LOCATION:  FACILITY: [facility] PHYSICIAN: Quay Burow, M.D. November 03, 1970 DATE OF PROCEDURE:  04/15/2019 DATE OF DISCHARGE: CARDIAC CATHETERIZATION History obtained from chart review.49 y.o. female with a hx of renovascular HTN s/p L RA stent, NSTEMI 2017 w/ 65% LAD rx medically,  HTN, breast CA, lymphoma of neck, GERD, Bell's palsy, COPD/ILD,who is being seen today for the evaluation of new CM w/ EF 25% and elevated troponinat the request of Dr Loanne Drilling.Prior exposure to cardiotoxic chemotherapy during Lymphoma and Breast cancer therapy.Previous nuclear study demonstrated apical infarction 2017. She was admitted with heart failure. Her enzymes were low and flat. 2D echo showed a decline in EF from the 40 to 45% range to the 25 to 30% range. Because of this she was referred for right left heart cath to define her anatomy and physiology.   Ms. Langell has essentially normal coronary  arteries suggesting that she has a nonischemic cardiomyopathy. Her LVEDP is low suggesting that she has been adequately diuresed. Selective angiography of her left renal artery stent shows this to be widely patent. She will need more aggressive pharmacologic therapy of her hypertension as well as her left ventricular dysfunction with guideline directed optimal medical therapy including but not limited to Entresto, carvedilol (or other beta-blocker), spironolactone. If her EF does not improve in 3 months she may require consideration for ICD implantation for primary prevention. MYNX closure devices were used to obtain hemostasis in both the artery and vein. The patient left lab in stable condition. These results have been communicated to the attending cardiologist, Dr. Tamala Julian. Quay Burow. MD, [facility] 04/15/2019 8:49 AM   VAS US RENAL ARTERY DUPLEX  Result Date: 04/14/2019 ABDOMINAL VISCERAL High Risk Factors: Hypertension, hyperlipidemia. Vascular Interventions: Left renal stent. Limitations: Air/bowel gas, obesity and Coughing. Unable to hold respiration. Performing Technologist: Baldwin Crown ARDMS, RVT  Examination Guidelines: A complete evaluation includes B-mode imaging, spectral Doppler, color Doppler, and power Doppler as needed of all accessible portions of each vessel. Bilateral testing is considered an integral part of a complete examination. Limited examinations for reoccurring indications may be performed as noted.  Duplex Findings: +--------------------+--------+--------+------+--------------+ Mesenteric          PSV cm/sEDV cm/sPlaque   Comments    +--------------------+--------+--------+------+--------------+ Aorta Mid              95      18                        +--------------------+--------+--------+------+--------------+ Celiac Artery Origin  120      35                        +--------------------+--------+--------+------+--------------+ SMA Origin                                 Not visualized +--------------------+--------+--------+------+--------------+    +------------------+--------+--------+-------+ Right Renal ArteryPSV cm/sEDV cm/sComment +------------------+--------+--------+-------+ Origin               74      22           +------------------+--------+--------+-------+ Proximal             91      21           +------------------+--------+--------+-------+ Mid                  54      16           +------------------+--------+--------+-------+ Distal               40      10           +------------------+--------+--------+-------+ +-----------------+--------+--------+-------+  Left Renal ArteryPSV cm/sEDV cm/sComment +-----------------+--------+--------+-------+ Origin             105      26           +-----------------+--------+--------+-------+ Proximal           101      31           +-----------------+--------+--------+-------+ Mid                 48      10           +-----------------+--------+--------+-------+ Distal              23      9            +-----------------+--------+--------+-------+  Technologist observations: Left renal stent not visualized. +------------+--------+--------+----+-----------+--------+--------+----+ Right KidneyPSV cm/sEDV cm/sRI  Left KidneyPSV cm/sEDV cm/sRI   +------------+--------+--------+----+-----------+--------+--------+----+ Upper Pole  34      13      0.63Upper Pole 39      13      0.66 +------------+--------+--------+----+-----------+--------+--------+----+ Mid         25      8       0.68Mid        36      11      0.68 +------------+--------+--------+----+-----------+--------+--------+----+ Lower Pole  25      8       0.68Lower Pole 31      12      0.62 +------------+--------+--------+----+-----------+--------+--------+----+ Hilar       37      11      0.70Hilar      30      10      0.67  +------------+--------+--------+----+-----------+--------+--------+----+ +------------------+----+------------------+----+ Right Kidney          Left Kidney            +------------------+----+------------------+----+ RAR                   RAR                    +------------------+----+------------------+----+ RAR (manual)          RAR (manual)           +------------------+----+------------------+----+ Cortex                Cortex                 +------------------+----+------------------+----+ Cortex thickness      Corex thickness        +------------------+----+------------------+----+ Kidney length (cm)9.80Kidney length (cm)9.40 +------------------+----+------------------+----+  Summary: Renal:  Right: Normal size right kidney. No evidence of right renal artery        stenosis. Left:  Normal size of left kidney. No evidence of left renal artery        stenosis. Renal stent not visualized.  *See table(s) above for measurements and observations.  Diagnosing physician: Curt Jews MD  Electronically signed by Curt Jews MD on 04/14/2019 at 3:04:30 PM.    Final     Cardiac Studies   TTE: 04/11/19  IMPRESSIONS    1. Left ventricular ejection fraction, by visual estimation, is 25 to 30%. The left ventricle has severely decreased function. There is no left ventricular hypertrophy.  2. Mild dyskinesis of the left ventricular, entire apical segment.  3. Severe hypokinesis of the left ventricular, entire anteroseptal wall and anterior wall.  4. Severe hypokinesis of the left ventricular,  entire inferolateral wall.  5. Elevated left atrial pressure.  6. Left ventricular diastolic parameters are consistent with Grade II diastolic dysfunction (pseudonormalization).  7. The left ventricle demonstrates global hypokinesis.  8. Global right ventricle has moderately reduced systolic function.The right ventricular size is normal. No increase in right ventricular wall thickness.  9.  Left atrial size was mildly dilated. 10. Right atrial size was normal. 11. Trivial pericardial effusion is present. 12. The mitral valve is normal in structure. Mild mitral valve regurgitation. 13. The tricuspid valve is normal in structure. 14. The aortic valve is normal in structure. Aortic valve regurgitation is not visualized. 15. The pulmonic valve was not well visualized. Pulmonic valve regurgitation is not visualized. 16. Mildly elevated pulmonary artery systolic pressure. 17. The inferior vena cava is dilated in size with <50% respiratory variability, suggesting right atrial pressure of 15 mmHg. 18. The left ventricular function has critically worse. 19. No intracardiac thrombi or masses were visualized.  Cath: 04/15/19  IMPRESSION: Ms. Venezia has essentially normal coronary arteries suggesting that she has a nonischemic cardiomyopathy. Her LVEDP is low suggesting that she has been adequately diuresed. Selective angiography of her left renal artery stent shows this to be widely patent. She will need more aggressive pharmacologic therapy of her hypertension as well as her left ventricular dysfunction with guideline directed optimal medical therapy including but not limited to Entresto, carvedilol (or other beta-blocker), spironolactone. If her EF does not improve in 3 months she may require consideration for ICD implantation for primary prevention. MYNX closure devices were used to obtain hemostasis in both the artery and vein. The patient left lab in stable condition. These results have been communicated to the attending cardiologist, Dr. Tamala Julian.  Quay Burow. MD, [facility]  Patient Profile     49 y.o. female with a hx of renovascular HTN s/p L RA stent, NSTEMI 2017 w/ 65% LAD rx medically, HTN, breast CA, lymphoma of neck, GERD, Bell's palsy, COPD/ILD,who is being seen today for the evaluation of new CM w/ EF 25% and elevated troponinat the request of Dr Loanne Drilling.Prior exposure to  cardiotoxic chemotherapy during Lymphoma and Breast cancer therapy.Previous nuclear study demonstrated apical infarction 2017.  Assessment & Plan    1. Acute on chronic systolic HF: EF noted at 123XX123 this admission down from prior 45% in 2017. R/LHC noted above. LVEDP was 7. Breathing is much improved today. Tolerating BB, ARB and spiro.  -- transition ARB to Entresto -- add hydralazine 25mg  TID with plans to transition to Bidil -- reduce lasix 20mg  daily -- needs to begin to ambulate today. -- CM consult for meds today  2. 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 Underwent cardiac cath this morning with essentially normal coronaries.   3. HTN: blood pressures still elevated. Changes as above.  4. HL: on high dose statin  5. Hypokalemia: 3.7 this morning.  6. Hx of Breast CA  For questions or updates, please contact [place] Please consult [email or web address] for contact info under    Signed, Reino Bellis, NP  04/15/2019, 9:28 AM

## 2019-04-15 NOTE — TOC Initial Note (Signed)
Transition of Care [facility]) - Initial/Assessment Note    Patient Details  Name: [name] MRN: [id number] Date of Birth: 02/07/1971  Transition of Care [facility]) CM/SW Contact:    Bethena Roys, RN Phone Number: 04/15/2019, 3:41 PM  Clinical Narrative:  Patient presented for shortness of breath. Prior to arrival patient was from home with support of children. Patient has primary care provider at [facility]. Patient gets medications from [place]. Patient has Medicaid. Case manager received referral for medication assistance:  Cost / copay of entreso 24/26mg  BID  Cost/copay jardiance 10mg  daily or farxiga 10mg  daily - which ever may be covered by insurance  Cost /copay bidil 1 tab tid                All Medications listed above are on the preferred list for Medicaid and cost will be no more than $3.00. Case Manager will continue to monitor for additional transition of care needs.   Expected Discharge Plan: Home/Self Care Barriers to Discharge: No Barriers Identified   Patient Goals and CMS Choice Patient states their goals for this hospitalization and ongoing recovery are:: " tp return home"   Choice offered to / list presented to : NA  Expected Discharge Plan and Services Expected Discharge Plan: Home/Self Care In-house Referral: NA Discharge Planning Services: CM Consult   Living arrangements for the past 2 months: Single Family Home                   Prior Living Arrangements/Services Living arrangements for the past 2 months: Single Family Home Lives with:: Adult Children(lives with daughter, son and grandchildren) Patient language and need for interpreter reviewed:: Yes Do you feel safe going back to the place where you live?: Yes      Need for Family Participation in Patient Care: Yes (Comment) Care giver support system in place?: Yes (comment)   Criminal Activity/Legal Involvement Pertinent to Current Situation/Hospitalization: No - Comment  as needed  Activities of Daily Living Home Assistive Devices/Equipment: None ADL Screening (condition at time of admission) Patient's cognitive ability adequate to safely complete daily activities?: Yes Is the patient deaf or have difficulty hearing?: No Does the patient have difficulty seeing, even when wearing glasses/contacts?: No Does the patient have difficulty concentrating, remembering, or making decisions?: No Patient able to express need for assistance with ADLs?: Yes Does the patient have difficulty dressing or bathing?: No Independently performs ADLs?: Yes (appropriate for developmental age) Does the patient have difficulty walking or climbing stairs?: Yes Weakness of Legs: Both Weakness of Arms/Hands: None  Permission Sought/Granted Permission sought to share information with : Family Supports                Emotional Assessment Appearance:: Appears stated age Attitude/Demeanor/Rapport: Engaged Affect (typically observed): Appropriate Orientation: : Oriented to Self, Oriented to Place, Oriented to  Time, Oriented to Situation Alcohol / Substance Use: Not Applicable Psych Involvement: No (comment)  Admission diagnosis:  ARDS (adult respiratory distress syndrome) ([place]) [J80] Respiratory failure ([place]) [J96.90] Acute respiratory failure with hypercapnia ([place]) [J96.02] Hypertensive emergency [I16.1] Endotracheally intubated [Z97.8] Acute respiratory failure with hypoxia and hypercapnia ([place]) [J96.01, J96.02] Patient Active Problem List   Diagnosis Date Noted  . Acute on chronic systolic heart failure ([place])   . Respiratory failure ([place]) 04/11/2019  . Acute respiratory failure with hypercapnia ([place]) 04/11/2019  . Renal artery stenosis ([place])   . Hypertension   . GERD (gastroesophageal reflux disease)   .  Genetic testing of female   . Coronary artery disease   . Cardiomegaly   . Bell's palsy   . Essential (primary) hypertension 12/24/2016  . Genetic testing  08/23/2016  . Family history of breast cancer   . Family history of prostate cancer   . Malignant neoplasm of overlapping sites of right breast in female, estrogen receptor positive ([place]) 07/23/2016  . CAD at cath 09/18/15 09/28/2015  . Chest pain at rest 09/18/2015  . Hypertensive urgency 09/18/2015  . NSTEMI (non-ST elevated myocardial infarction) ([place]) 09/18/2015  . Hypokalemia 09/18/2015  . Asthma 09/18/2015  . History of lymphoma 09/18/2015  . Hypertensive emergency   . Left renal artery stenosis ([place]) 08/23/2014  . 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 Headache above the eye region 08/16/2014  . Blurry vision 08/16/2014  . Perceived hearing changes 08/16/2014  . Snoring 08/16/2014  . Witnessed apneic spells 08/16/2014  . Excessive daytime sleepiness 08/16/2014  . Nocturnal headaches 08/16/2014   PCP:  Javier Docker, MD Pharmacy:   [id number] [name], [place] [address] Phone: [phone] Fax: [phone]     Social Determinants of Health (SDOH) Interventions    Readmission Risk Interventions Readmission Risk Prevention Plan 04/15/2019  Transportation Screening Complete  HRI or [place] Complete  Social Work Consult for [place] Planning/Counseling Complete  Palliative Care Screening Not Applicable  Medication Review Press photographer) Complete  Some recent data might be hidden

## 2019-04-15 NOTE — Plan of Care (Signed)
  Problem: Clinical Measurements: Goal: Respiratory complications will improve Outcome: Progressing Goal: Cardiovascular complication will be avoided Outcome: Progressing   

## 2019-04-15 NOTE — Evaluation (Signed)
Physical Therapy Evaluation Patient Details Name: [name] MRN: [id number] DOB: 1970-11-01 Today's Date: 04/15/2019   History of Present Illness  49 y.o. female with a hx of renovascular HTN s/p L RA stent, NSTEMI 2017 w/ 65% LAD rx medically, HTN, breast CA, lymphoma of neck, GERD, Bell's palsy, COPD/ILD, who is being seen today for the evaluation of new CM w/ EF 25% and elevated troponin. Pt underwent cardiac cath on 1/21.  Clinical Impression  Pt presents to PT with deficits in activity tolerance, gait, balance, and power, however is not far from her functional baseline per pt report. Pt is able to ambulate household distances but does demonstrate increased work of breathing. Pt will benefit from continued acute PT POC to improve activity tolerance and aide in energy conservation education. Pt will benefit form receiving Rollator at time of discharge to aide in energy conservation and improve community mobility.    Follow Up Recommendations No PT follow up    Equipment Recommendations  Other (comment)(4 wheeled walker, for energy conservation)    Recommendations for Other Services       Precautions / Restrictions Precautions Precautions: Fall Restrictions Weight Bearing Restrictions: No      Mobility  Bed Mobility Overal bed mobility: Independent                Transfers Overall transfer level: Needs assistance Equipment used: None Transfers: Sit to/from Stand Sit to Stand: Supervision            Ambulation/Gait Ambulation/Gait assistance: Supervision Gait Distance (Feet): 80 Feet Assistive device: None Gait Pattern/deviations: Step-through pattern Gait velocity: funcitonal Gait velocity interpretation: 1.31 - 2.62 ft/sec, indicative of limited community ambulator General Gait Details: shortened step through gait with reduced gait speed, minimal increase in lateral sway  Stairs            Wheelchair Mobility    Modified Rankin (Stroke  Patients Only)       Balance Overall balance assessment: Needs assistance Sitting-balance support: No upper extremity supported;Feet supported Sitting balance-Leahy Scale: Good Sitting balance - Comments: modI   Standing balance support: No upper extremity supported Standing balance-Leahy Scale: Good Standing balance comment: supervision, no UE support                             Pertinent Vitals/Pain Pain Assessment: No/denies pain    Home Living Family/patient expects to be discharged to:: Private residence Living Arrangements: Children Available Help at Discharge: Family;Available 24 hours/day Type of Home: House Home Access: Stairs to enter Entrance Stairs-Rails: Can reach both Entrance Stairs-Number of Steps: 4 Home Layout: Two level;Able to live on main level with bedroom/bathroom Home Equipment: None      Prior Function Level of Independence: Independent         Comments: pt is a household ambulator, requiring frequent breaks during activity due to fatigue     Hand Dominance        Extremity/Trunk Assessment   Upper Extremity Assessment Upper Extremity Assessment: Overall WFL for tasks assessed    Lower Extremity Assessment Lower Extremity Assessment: Overall WFL for tasks assessed    Cervical / Trunk Assessment Cervical / Trunk Assessment: Normal  Communication   Communication: No difficulties  Cognition Arousal/Alertness: Awake/alert Behavior During Therapy: WFL for tasks assessed/performed Overall Cognitive Status: Within Functional Limits for tasks assessed  General Comments General comments (skin integrity, edema, etc.): VSS on RA, pt with noted increased work of breathing but denies SOB, pt on RA    Exercises     Assessment/Plan    PT Assessment Patient needs continued PT services  PT Problem List Decreased activity tolerance;Decreased balance;Cardiopulmonary status  limiting activity       PT Treatment Interventions DME instruction;Gait training;Stair training;Functional mobility training;Therapeutic activities;Therapeutic exercise;Balance training;Patient/family education    PT Goals (Current goals can be found in the Care Plan section)  Acute Rehab PT Goals Patient Stated Goal: To go home PT Goal Formulation: With patient Time For Goal Achievement: 04/29/19 Potential to Achieve Goals: Good    Frequency Min 3X/week   Barriers to discharge        Co-evaluation               AM-PAC PT "6 Clicks" Mobility  Outcome Measure Help needed turning from your back to your side while in a flat bed without using bedrails?: None Help needed moving from lying on your back to sitting on the side of a flat bed without using bedrails?: None Help needed moving to and from a bed to a chair (including a wheelchair)?: None Help needed standing up from a chair using your arms (e.g., wheelchair or bedside chair)?: None Help needed to walk in hospital room?: None Help needed climbing 3-5 steps with a railing? : A Little 6 Click Score: 23    End of Session Equipment Utilized During Treatment: (none) Activity Tolerance: Patient tolerated treatment well Patient left: in chair;with call bell/phone within reach Nurse Communication: Mobility status PT Visit Diagnosis: Other (comment)(cardiopulmonary endurance)    Time: 1152-1209 PT Time Calculation (min) (ACUTE ONLY): 17 min   Charges:   PT Evaluation $PT Eval Moderate Complexity: [place], PT, DPT Acute Rehabilitation Pager: [phone]   Zenaida Niece 04/15/2019, 2:31 PM

## 2019-04-15 NOTE — Interval H&P Note (Signed)
Cath Lab Visit (complete for each Cath Lab visit)  Clinical Evaluation Leading to the Procedure:   ACS: No.  Non-ACS:    Anginal Classification: CCS I  Anti-ischemic medical therapy: Maximal Therapy (2 or more classes of medications)  Non-Invasive Test Results: No non-invasive testing performed  Prior CABG: No previous CABG      History and Physical Interval Note:  04/15/2019 7:50 AM  [name]  has presented today for surgery, with the diagnosis of heart failure.  The various methods of treatment have been discussed with the patient and family. After consideration of risks, benefits and other options for treatment, the patient has consented to  Procedure(s): RIGHT/LEFT HEART CATH AND CORONARY ANGIOGRAPHY (N/A) as a surgical intervention.  The patient's history has been reviewed, patient examined, no change in status, stable for surgery.  I have reviewed the patient's chart and labs.  Questions were answered to the patient's satisfaction.     Quay Burow

## 2019-04-15 NOTE — Progress Notes (Signed)
PROGRESS NOTE    [name]    Code Status: Full Code  [id number] DOB: 1970/12/29 DOA: 04/11/2019  PCP: Javier Docker, MD    Hospital Summary  This is a 49 year old female with history of breast cancer in remission, asthma/COPD/ILD who presented with acute hypercarbic respiratory failure which was sudden in onset and subsequently PCCM admitted patient.  She required intubation.  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  He self extubated on 1/18.  And was found to be hypertensive.  Nitro ointment and Catapres were started.  Also was disoriented and pulled central line ICU.  She was on steroids IV which were changed to p.o. prednisone and Plavix were discontinued 1/19 which were started on admission.  Bronchodilators changed to [facility] and Incruse.  Cardiology was consulted for hypertensive emergency as well as worsening/new cardiomyopathy with EF decreased from 45% in 2017 to 25% now.  Patient was PCCM pickup for 04/14/2019 by [facility].  Cardiology has planned for aggressive diuresis as well as possible right/left heart catheterization once clinical status improves.  A & P   Principal Problem:   Acute respiratory failure with hypercapnia (HCC) Active Problems:   Renovascular hypertension   Left renal artery stenosis (HCC)   Hypokalemia   Hypertensive emergency   Malignant neoplasm of overlapping sites of right breast in female, estrogen receptor positive ([place])   Essential (primary) hypertension   Coronary artery disease   Respiratory failure ([place])   Acute on chronic systolic heart failure ([place])   1. Acute hypercarbic respiratory failure secondary to flash pulmonary edema, CAP, COPD/ILD a. Intubated 1/17, self extubated 1/18 b. Off antibiotics c. Continue Incruse, Dulera and albuterol as needed d. Day 5/5 steroids 2. Hypertensive emergency of unknown  etiology resulting in flash pulmonary edema a. Renal ultrasound negative b. Entresto added, hydralazine and isosorbide discontinued c. Continue beta-blocker spironolactone d. Goal BP<140/90 3. Acute on chronic systolic heart failure, nonischemic cardiomyopathy a. EF decreased this admission to 25%, down from 45% in 2017 b. Etiology unknown however has history of chemotherapy which may be contributing c. Cardiac catheterization 1/21 with widely patent coronary arteries d. Continue beta-blocker, spironolactone, ARB changed to Entresto 4. Agitation requiring sedation resolved 5. History of CAD on aspirin, statin 6. Hypertension uncontrolled, adjustments as above 7. Hyperlipidemia on statin 8. Hypokalemia pleated, monitor while on spironolactone  DVT prophylaxis: Heparin Diet: Heart healthy Family Communication: No family at bedside Disposition Plan: Barrier to discharge is consistent BP control discharge in 24 to 48 hours  Consultants  Cardiology PCCM  Procedures  Intubated 1/17-1/18  Antibiotics   Anti-infectives (From admission, onward)   Start     Dose/Rate Route Frequency Ordered Stop   04/12/19 0800  cefTRIAXone (ROCEPHIN) 2 g in sodium chloride 0.9 % 100 mL IVPB  Status:  Discontinued     2 g 200 mL/hr over 30 Minutes Intravenous Every 24 hours 04/11/19 1024 04/13/19 1100   04/11/19 0800  cefTRIAXone (ROCEPHIN) 1 g in sodium chloride 0.9 % 100 mL IVPB  Status:  Discontinued     1 g 200 mL/hr over 30 Minutes Intravenous Every 24 hours 04/11/19 0721 04/11/19 1024   04/11/19 0730  azithromycin (ZITHROMAX) 500 mg in sodium chloride 0.9 % 250 mL IVPB  Status:  Discontinued     500 mg 250 mL/hr over 60 Minutes Intravenous Daily 04/11/19 0721 04/13/19 1100  Subjective   Seen and examined at bedside post cath his a.m.  Currently without any complaints other than some mild wheezing.  Tolerating room air and is comfortable.  Objective   Vitals:   04/15/19 1115  04/15/19 1200 04/15/19 1300 04/15/19 1402  BP: 131/83 (!) 113/95 119/85 (!) 132/96  Pulse: 78  82 65  Resp:   19 20  Temp: 98.6 F (37 C)     TempSrc: Oral     SpO2: 97%  93% 99%  Weight:      Height:        Intake/Output Summary (Last 24 hours) at 04/15/2019 1535 Last data filed at 04/15/2019 1451 Gross per 24 hour  Intake 1147.12 ml  Output 800 ml  Net 347.12 ml   Filed Weights   04/13/19 0314 04/14/19 0517 04/15/19 0441  Weight: 74.6 kg 76 kg 74.7 kg    Examination:  Physical Exam Vitals and nursing note reviewed.  Constitutional:      Appearance: Normal appearance.  HENT:     Head: Normocephalic and atraumatic.     Nose: Nose normal.     Mouth/Throat:     Mouth: Mucous membranes are moist.  Eyes:     Conjunctiva/sclera: Conjunctivae normal.  Cardiovascular:     Rate and Rhythm: Normal rate and regular rhythm.  Pulmonary:     Effort: Pulmonary effort is normal.     Breath sounds: Wheezing present.     Comments: Diffuse end expiratory wheeze Abdominal:     General: Abdomen is flat.     Palpations: Abdomen is soft.  Musculoskeletal:        General: No swelling. Normal range of motion.  Neurological:     Mental Status: She is alert. Mental status is at baseline.  Psychiatric:        Mood and Affect: Mood normal.        Behavior: Behavior normal.     Data Reviewed: I have personally reviewed following labs and imaging studies  CBC: Recent Labs  Lab 04/11/19 0520 04/11/19 0739 04/11/19 0746 04/11/19 0849 04/12/19 0414 04/12/19 1439 04/12/19 2111 04/13/19 0911 04/14/19 0255 04/15/19 0324 04/15/19 0816  WBC 17.3*   < > 26.9*  --  15.6*  --   --  20.4* 20.1* 15.7*  --   NEUTROABS 6.6  --  23.9*  --   --   --   --   --   --   --   --   HGB 14.1   < > 14.4   < > 12.6   < > 13.9 12.1 13.3 13.3 13.6  13.9  HCT 46.5*   < > 47.0*   < > 38.6   < > 41.0 37.7 41.0 40.5 40.0  41.0  MCV 99.1   < > 97.7  --  92.3  --   --  93.1 92.6 91.0  --   PLT 266   < >  276  --  191  --   --  199 213 220  --    < > = values in this interval not displayed.   Basic Metabolic Panel: Recent Labs  Lab 04/11/19 0520 04/11/19 0739 04/11/19 0746 04/11/19 0849 04/11/19 1631 04/11/19 1634 04/12/19 0414 04/12/19 1439 04/12/19 2022 04/12/19 2111 04/13/19 0243 04/14/19 0255 04/15/19 0324 04/15/19 0816  NA 141   < >  --    < >   < >  --  140   < >  --  143 142 139 139 140  140  K 4.2   < >  --    < >   < >  --  4.1   < >  --  4.3 3.8 3.3* 3.7 3.3*  3.3*  CL 103  --   --   --   --   --  110  --   --   --  108 107 103  --   CO2 24  --   --   --   --   --  21*  --   --   --  22 23 22   --   GLUCOSE 383*  --   --   --   --   --  140*  --   --   --  131* 108* 96  --   BUN 6  --   --   --   --   --  20  --   --   --  20 23* 23*  --   CREATININE 1.19*   < > 1.05*  --   --   --  0.93  --   --   --  0.69 0.94 0.90  --   CALCIUM 9.3  --   --   --   --   --  9.3  --   --   --  9.2 10.0 9.9  --   MG 2.3   < > 2.3  --   --  1.7 1.7  --  2.3  --   --   --  2.0  --   PHOS  --   --  8.0*  --   --  2.8 2.1*  --  2.6  --  2.6  --   --   --    < > = values in this interval not displayed.   GFR: Estimated Creatinine Clearance: 73.1 mL/min (by C-G formula based on SCr of 0.9 mg/dL). Liver Function Tests: Recent Labs  Lab 04/11/19 0520  AST 82*  ALT 46*  ALKPHOS 172*  BILITOT 0.6  PROT 6.5  ALBUMIN 3.6   No results for input(s): LIPASE, AMYLASE in the last 168 hours. No results for input(s): AMMONIA in the last 168 hours. Coagulation Profile: No results for input(s): INR, PROTIME in the last 168 hours. Cardiac Enzymes: No results for input(s): CKTOTAL, CKMB, CKMBINDEX, TROPONINI in the last 168 hours. BNP (last 3 results) No results for input(s): PROBNP in the last 8760 hours. HbA1C: No results for input(s): HGBA1C in the last 72 hours. CBG: Recent Labs  Lab 04/14/19 1612 04/14/19 2140 04/15/19 0032 04/15/19 0447 04/15/19 1112  GLUCAP 126* 118* 100* 92  130*   Lipid Profile: No results for input(s): CHOL, HDL, LDLCALC, TRIG, CHOLHDL, LDLDIRECT in the last 72 hours. Thyroid Function Tests: No results for input(s): TSH, T4TOTAL, FREET4, T3FREE, THYROIDAB in the last 72 hours. Anemia Panel: No results for input(s): VITAMINB12, FOLATE, FERRITIN, TIBC, IRON, RETICCTPCT in the last 72 hours. Sepsis Labs: Recent Labs  Lab 04/11/19 0746  PROCALCITON 1.22    Recent Results (from the past 240 hour(s))  Respiratory Panel by RT PCR (Flu A&B, Covid) - Nasopharyngeal Swab     Status: None   Collection Time: 04/11/19  5:24 AM   Specimen: Nasopharyngeal Swab  Result Value Ref Range Status   SARS Coronavirus 2 by RT PCR NEGATIVE NEGATIVE Final    Comment: (NOTE) SARS-CoV-2 target nucleic acids are NOT  DETECTED. The SARS-CoV-2 RNA is generally detectable in upper respiratoy specimens during the acute phase of infection. The lowest concentration of SARS-CoV-2 viral copies this assay can detect is 131 copies/mL. A negative result does not preclude SARS-Cov-2 infection and should not be used as the sole basis for treatment or other patient management decisions. A negative result may occur with  improper specimen collection/handling, submission of specimen other than nasopharyngeal swab, presence of viral mutation(s) within the areas targeted by this assay, and inadequate number of viral copies (<131 copies/mL). A negative result must be combined with clinical observations, patient history, and epidemiological information. The expected result is Negative. Fact Sheet for Patients:  PinkCheek.be Fact Sheet for Healthcare Providers:  GravelBags.it This test is not yet ap proved or cleared by the [place] FDA and  has been authorized for detection and/or diagnosis of SARS-CoV-2 by FDA under an Emergency Use Authorization (EUA). This EUA will remain  in effect (meaning this test can be  used) for the duration of the COVID-19 declaration under Section 564(b)(1) of the Act, 21 U.S.C. section 360bbb-3(b)(1), unless the authorization is terminated or revoked sooner.    Influenza A by PCR NEGATIVE NEGATIVE Final   Influenza B by PCR NEGATIVE NEGATIVE Final    Comment: (NOTE) The Xpert Xpress SARS-CoV-2/FLU/RSV assay is intended as an aid in  the diagnosis of influenza from Nasopharyngeal swab specimens and  should not be used as a sole basis for treatment. Nasal washings and  aspirates are unacceptable for Xpert Xpress SARS-CoV-2/FLU/RSV  testing. Fact Sheet for Patients: PinkCheek.be Fact Sheet for Healthcare Providers: GravelBags.it This test is not yet approved or cleared by the [place] FDA and  has been authorized for detection and/or diagnosis of SARS-CoV-2 by  FDA under an Emergency Use Authorization (EUA). This EUA will remain  in effect (meaning this test can be used) for the duration of the  Covid-19 declaration under Section 564(b)(1) of the Act, 21  U.S.C. section 360bbb-3(b)(1), unless the authorization is  terminated or revoked. Performed at [facility], [place] [address], [place], [place] [address]   Urine culture     Status: None   Collection Time: 04/11/19  6:13 AM   Specimen: Urine, Catheterized  Result Value Ref Range Status   Specimen Description URINE, CATHETERIZED  Final   Special Requests NONE  Final   Culture   Final    NO GROWTH Performed at [facility], [address] [place], [place] [address]    Report Status 04/12/2019 FINAL  Final  MRSA PCR Screening     Status: None   Collection Time: 04/11/19  9:29 AM   Specimen: Nasal Mucosa; Nasopharyngeal  Result Value Ref Range Status   MRSA by PCR NEGATIVE NEGATIVE Final    Comment:        The GeneXpert MRSA Assay (FDA approved for NASAL specimens only), is one component of a comprehensive MRSA  colonization surveillance program. It is not intended to diagnose MRSA infection nor to guide or monitor treatment for MRSA infections. Performed at [facility], [place] [address] [place], [place] [address]   Culture, respiratory (non-expectorated)     Status: None   Collection Time: 04/11/19 11:26 AM   Specimen: Tracheal Aspirate; Respiratory  Result Value Ref Range Status   Specimen Description TRACHEAL ASPIRATE  Final   Special Requests NONE  Final   Gram Stain   Final    MODERATE WBC PRESENT,BOTH PMN AND MONONUCLEAR MODERATE  GRAM POSITIVE COCCI IN PAIRS AND CHAINS FEW GRAM POSITIVE RODS RARE GRAM NEGATIVE RODS    Culture   Final    Consistent with normal respiratory flora. Performed at [facility], [place] [address] [place],  [address]    Report Status 04/13/2019 FINAL  Final         Radiology Studies: CARDIAC CATHETERIZATION  Result Date: 04/15/2019 [name] is a 49 y.o. female  [id number] LOCATION:  FACILITY: [facility] PHYSICIAN: Quay Burow, M.D. [birth year] DATE OF PROCEDURE:  04/15/2019 DATE OF DISCHARGE: CARDIAC CATHETERIZATION History obtained from chart review.49 y.o. female with a hx of renovascular HTN s/p L RA stent, NSTEMI 2017 w/ 65% LAD rx medically, HTN, breast CA, lymphoma of neck, GERD, Bell's palsy, COPD/ILD,who is being seen today for the evaluation of new CM w/ EF 25% and elevated troponinat the request of Dr Loanne Drilling.Prior exposure to cardiotoxic chemotherapy during Lymphoma and Breast cancer therapy.Previous nuclear study demonstrated apical infarction 2017. She was admitted with heart failure. Her enzymes were low and flat. 2D echo showed a decline in EF from the 40 to 45% range to the 25 to 30% range. Because of this she was referred for right left heart cath to define her anatomy and physiology.   Ms. Kenter has essentially normal coronary arteries suggesting that she has a nonischemic cardiomyopathy. Her LVEDP is low  suggesting that she has been adequately diuresed. Selective angiography of her left renal artery stent shows this to be widely patent. She will need more aggressive pharmacologic therapy of her hypertension as well as her left ventricular dysfunction with guideline directed optimal medical therapy including but not limited to Entresto, carvedilol (or other beta-blocker), spironolactone. If her EF does not improve in 3 months she may require consideration for ICD implantation for primary prevention. MYNX closure devices were used to obtain hemostasis in both the artery and vein. The patient left lab in stable condition. These results have been communicated to the attending cardiologist, Dr. Tamala Julian. Quay Burow. MD, [facility] 04/15/2019 8:49 AM   VAS US RENAL ARTERY DUPLEX  Result Date: 04/14/2019 ABDOMINAL VISCERAL High Risk Factors: Hypertension, hyperlipidemia. Vascular Interventions: Left renal stent. Limitations: Air/bowel gas, obesity and Coughing. Unable to hold respiration. Performing Technologist: Baldwin Crown ARDMS, RVT  Examination Guidelines: A complete evaluation includes B-mode imaging, spectral Doppler, color Doppler, and power Doppler as needed of all accessible portions of each vessel. Bilateral testing is considered an integral part of a complete examination. Limited examinations for reoccurring indications may be performed as noted.  Duplex Findings: +--------------------+--------+--------+------+--------------+ Mesenteric          PSV cm/sEDV cm/sPlaque   Comments    +--------------------+--------+--------+------+--------------+ Aorta Mid              95      18                        +--------------------+--------+--------+------+--------------+ Celiac Artery Origin  120      35                        +--------------------+--------+--------+------+--------------+ SMA Origin                                Not visualized  +--------------------+--------+--------+------+--------------+    +------------------+--------+--------+-------+ Right Renal ArteryPSV cm/sEDV cm/sComment +------------------+--------+--------+-------+ Origin  74      22           +------------------+--------+--------+-------+ Proximal             91      21           +------------------+--------+--------+-------+ Mid                  54      16           +------------------+--------+--------+-------+ Distal               40      10           +------------------+--------+--------+-------+ +-----------------+--------+--------+-------+ Left Renal ArteryPSV cm/sEDV cm/sComment +-----------------+--------+--------+-------+ Origin             105      26           +-----------------+--------+--------+-------+ Proximal           101      31           +-----------------+--------+--------+-------+ Mid                 48      10           +-----------------+--------+--------+-------+ Distal              23      9            +-----------------+--------+--------+-------+  Technologist observations: Left renal stent not visualized. +------------+--------+--------+----+-----------+--------+--------+----+ Right KidneyPSV cm/sEDV cm/sRI  Left KidneyPSV cm/sEDV cm/sRI   +------------+--------+--------+----+-----------+--------+--------+----+ Upper Pole  34      13      0.63Upper Pole 39      13      0.66 +------------+--------+--------+----+-----------+--------+--------+----+ Mid         25      8       0.68Mid        36      11      0.68 +------------+--------+--------+----+-----------+--------+--------+----+ Lower Pole  25      8       0.68Lower Pole 31      12      0.62 +------------+--------+--------+----+-----------+--------+--------+----+ Hilar       37      11      0.70Hilar      30      10      0.67  +------------+--------+--------+----+-----------+--------+--------+----+ +------------------+----+------------------+----+ Right Kidney          Left Kidney            +------------------+----+------------------+----+ RAR                   RAR                    +------------------+----+------------------+----+ RAR (manual)          RAR (manual)           +------------------+----+------------------+----+ Cortex                Cortex                 +------------------+----+------------------+----+ Cortex thickness      Corex thickness        +------------------+----+------------------+----+ Kidney length (cm)9.80Kidney length (cm)9.40 +------------------+----+------------------+----+  Summary: Renal:  Right: Normal size right kidney. No evidence of right renal artery        stenosis. Left:  Normal size of left kidney. No evidence  of left renal artery        stenosis. Renal stent not visualized.  *See table(s) above for measurements and observations.  Diagnosing physician: Curt Jews MD  Electronically signed by Curt Jews MD on 04/14/2019 at 3:04:30 PM.    Final         Scheduled Meds: . anastrozole  1 mg Oral Daily  . aspirin  81 mg Per Tube Daily  . atorvastatin  80 mg Per Tube q1800  . famotidine  20 mg Oral Daily  . [START ON 04/16/2019] furosemide  20 mg Oral Daily  . heparin  5,000 Units Subcutaneous Q8H  . insulin aspart  0-20 Units Subcutaneous Q4H  . loratadine  10 mg Oral Daily  . mouth rinse  15 mL Mouth Rinse BID  . metoprolol succinate  50 mg Oral BID  . mometasone-formoterol  2 puff Inhalation BID  . predniSONE  50 mg Oral Q breakfast  . sacubitril-valsartan  1 tablet Oral BID  . sodium chloride flush  3 mL Intravenous Q12H  . sodium chloride flush  3 mL Intravenous Q12H  . spironolactone  25 mg Oral Daily  . umeclidinium bromide  1 puff Inhalation Daily  . venlafaxine XR  75 mg Oral Q breakfast   Continuous Infusions: . sodium chloride        LOS: 4 days    Time spent: 20 minutes with over 50% of the time coordinating the patient's care    Harold Hedge, DO [facility] Pager [phone]  If 7PM-7AM, please contact night-coverage [email or web address] Password [facility] 04/15/2019, 3:35 PM

## 2019-04-16 ENCOUNTER — Inpatient Hospital Stay (HOSPITAL_COMMUNITY): Payer: Medicaid Other

## 2019-04-16 DIAGNOSIS — D72829 Elevated white blood cell count, unspecified: Secondary | ICD-10-CM

## 2019-04-16 LAB — GLUCOSE, CAPILLARY
Glucose-Capillary: 106 mg/dL — ABNORMAL HIGH (ref 70–99)
Glucose-Capillary: 125 mg/dL — ABNORMAL HIGH (ref 70–99)
Glucose-Capillary: 95 mg/dL (ref 70–99)
Glucose-Capillary: 95 mg/dL (ref 70–99)

## 2019-04-16 LAB — CBC
HCT: 41.9 % (ref 36.0–46.0)
Hemoglobin: 13.6 g/dL (ref 12.0–15.0)
MCH: 29.6 pg (ref 26.0–34.0)
MCHC: 32.5 g/dL (ref 30.0–36.0)
MCV: 91.1 fL (ref 80.0–100.0)
Platelets: 237 10*3/uL (ref 150–400)
RBC: 4.6 MIL/uL (ref 3.87–5.11)
RDW: 14.6 % (ref 11.5–15.5)
WBC: 17.1 10*3/uL — ABNORMAL HIGH (ref 4.0–10.5)
nRBC: 0.1 % (ref 0.0–0.2)

## 2019-04-16 LAB — BASIC METABOLIC PANEL
Anion gap: 12 (ref 5–15)
BUN: 17 mg/dL (ref 6–20)
CO2: 23 mmol/L (ref 22–32)
Calcium: 9.8 mg/dL (ref 8.9–10.3)
Chloride: 102 mmol/L (ref 98–111)
Creatinine, Ser: 0.82 mg/dL (ref 0.44–1.00)
GFR calc Af Amer: >60 mL/min (ref >60)
GFR calc non Af Amer: >60 mL/min (ref >60)
Glucose, Bld: 95 mg/dL (ref 70–99)
Potassium: 3.6 mmol/L (ref 3.5–5.1)
Sodium: 137 mmol/L (ref 135–145)

## 2019-04-16 LAB — MAGNESIUM: Magnesium: 2 mg/dL (ref 1.7–2.4)

## 2019-04-16 MED ORDER — METOPROLOL SUCCINATE ER 50 MG PO TB24: 50.0000 mg | ORAL_TABLET | Freq: Two times a day (BID) | ORAL | 0 refills | Status: DC

## 2019-04-16 MED ORDER — SACUBITRIL-VALSARTAN 49-51 MG PO TABS
1.0000 | ORAL_TABLET | Freq: Two times a day (BID) | ORAL | Status: DC
  Administered 2019-04-16: 12:00:00 1 via ORAL
  Filled 2019-04-16 (×2): qty 1

## 2019-04-16 MED ORDER — SPIRONOLACTONE 25 MG PO TABS: 25.0000 mg | ORAL_TABLET | Freq: Every day | ORAL | 0 refills | Status: DC

## 2019-04-16 MED ORDER — DULERA 200-5 MCG/ACT IN AERO: 2.0000 | INHALATION_SPRAY | Freq: Two times a day (BID) | RESPIRATORY_TRACT | 0 refills | Status: DC

## 2019-04-16 MED ORDER — SACUBITRIL-VALSARTAN 49-51 MG PO TABS: 1.0000 | ORAL_TABLET | Freq: Two times a day (BID) | ORAL | 0 refills | Status: DC

## 2019-04-16 MED ORDER — FUROSEMIDE 20 MG PO TABS: 20.0000 mg | ORAL_TABLET | Freq: Every day | ORAL | 0 refills | Status: DC

## 2019-04-16 MED FILL — METOPROLOL SUCCINATE ER 50: 50 | 30 days supply | Qty: 60 | Fill #0

## 2019-04-16 MED FILL — SPIRONOLACTONE 25 MG TABLET: 25 | 30 days supply | Qty: 30 | Fill #0

## 2019-04-16 MED FILL — FUROSEMIDE 20 MG TAB: 20 | 30 days supply | Qty: 30 | Fill #0

## 2019-04-16 MED FILL — ENTRESTO 49 MG-51 MG TABLET: 49-51 | 30 days supply | Qty: 60 | Fill #0

## 2019-04-16 NOTE — Discharge Summary (Addendum)
Physician Discharge Summary  [name] [id number] DOB: 1971-02-13 DOA: 04/11/2019  PCP: Javier Docker, MD  Admit date: 04/11/2019 Discharge date: 04/16/2019   Code Status: Full Code  Admitted From: Home Discharged to: [place]: None Equipment/Devices: None Discharge Condition: Stable  Recommendations for Outpatient Follow-up   1. If BP tolerates, consider adding BiDil 2. Leukocytosis on day of discharge without signs/symptoms of infection likely reactive as well as steroid-induced.  Follow-up CBC ordered, please follow-up outpatient 3. To follow-up with Dr. Claiborne Billings in 7 to 14 days with [facility] Summary  This is a 49 year old female with history of breast cancer in remission, asthma/COPD/ILD who presented with acute hypercarbic respiratory failure which was sudden in onset and subsequently PCCM admitted patient.  She required intubation.  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  She self extubated on 1/18 and was found to be hypertensive.  Nitro ointment and Catapres were started.  Also was disoriented and pulled central line ICU.  She was on steroids IV which were changed to p.o. prednisone and Plavix were discontinued 1/19 which were started on admission.  Bronchodilators changed to [facility] and Incruse.  Cardiology was consulted for hypertensive emergency as well as worsening/new cardiomyopathy with EF decreased from 45% in 2017 to 25% now.  Patient was PCCM pickup for 04/14/2019 by [facility] hospitalists.  Cardiology aggressively diuresed and patient underwent R/LH catheterization on 1/21 showing widely patent coronary arteries.  Additionally patient's BP was difficult to control but was finally stabilized on regimen as below.  Patient advised to follow-up with cardiology and PCP in the coming weeks for close follow-up.   A & P   Principal Problem:   Acute  respiratory failure with hypercapnia (HCC) Active Problems:   Renovascular hypertension   Left renal artery stenosis (HCC)   Hypokalemia   Hypertensive emergency   Malignant neoplasm of overlapping sites of right breast in female, estrogen receptor positive ([place])   Essential (primary) hypertension   Coronary artery disease   Respiratory failure ([place])   Acute on chronic systolic heart failure ([place])    1. Acute hypercarbic respiratory failure secondary to flash pulmonary edema, CAP, COPD/ILD, resolved a. Intubated 1/17, self extubated 1/18 b. Continue Dulera and albuterol as needed c. Pleated 5 days steroids and 3 days azithromycin, 2 days ceftriaxone. 2. Hypertensive emergency of unknown etiology resulting in flash pulmonary edema a. Renal ultrasound negative b. Entresto and spironolactone added, continued on beta-blocker c. isosorbide discontinued d. Consider reinstituting BiDil if BP allows outpatient e. Goal BP<140/90 3. Acute on chronic systolic heart failure, nonischemic cardiomyopathy a. EF decreased this admission to 25%, down from 45% in 2017 b. Etiology unknown however has history of chemotherapy which may be contributing c. Cardiac catheterization 1/21 with widely patent coronary arteries d. Continue beta-blocker, spironolactone, ARB changed to Entresto 4. Leukocytosis, suspect reactive and steroid-induced 1. No signs/symptoms of infection 2. Follow-up CBC outpatient 5. Agitation requiring sedation resolved 6. History of CAD on aspirin, statin 7. Hypertension uncontrolled, adjustments as above 8. Hyperlipidemia on statin 9. Hypokalemia pleated, monitor while on spironolactone    Consultants  . Cardiology  Procedures  . R/LHC 04/15/2019  Antibiotics   Anti-infectives (From admission, onward)   Start     Dose/Rate Route Frequency Ordered Stop   04/12/19 0800  cefTRIAXone (ROCEPHIN) 2 g in sodium chloride 0.9 % 100 mL IVPB  Status:  Discontinued  2 g 200  mL/hr over 30 Minutes Intravenous Every 24 hours 04/11/19 1024 04/13/19 1100   04/11/19 0800  cefTRIAXone (ROCEPHIN) 1 g in sodium chloride 0.9 % 100 mL IVPB  Status:  Discontinued     1 g 200 mL/hr over 30 Minutes Intravenous Every 24 hours 04/11/19 0721 04/11/19 1024   04/11/19 0730  azithromycin (ZITHROMAX) 500 mg in sodium chloride 0.9 % 250 mL IVPB  Status:  Discontinued     500 mg 250 mL/hr over 60 Minutes Intravenous Daily 04/11/19 0721 04/13/19 1100        Subjective  Patient seen and examined at bedside no acute distress and resting comfortably.  No events overnight.  Tolerating diet. In good spirits and anticipating discharge.  Admits to some wheezing which is tolerable.  Admits to ambulating without dyspnea  Denies any chest pain, shortness of breath, fever, nausea, vomiting, urinary or bowel complaints. Otherwise ROS negative   Objective   Discharge Exam: Vitals:   04/16/19 0834 04/16/19 0956  BP:  131/80  Pulse:  71  Resp:    Temp:    SpO2: 92%    Vitals:   04/16/19 0609 04/16/19 0817 04/16/19 0834 04/16/19 0956  BP:    131/80  Pulse:  75  71  Resp:  18    Temp:  98.5 F (36.9 C)    TempSrc:  Oral    SpO2:  92% 92%   Weight: 75.2 kg     Height:        Physical Exam Vitals and nursing note reviewed.  Constitutional:      Appearance: She is obese.  HENT:     Head: Normocephalic.     Nose: Nose normal.     Mouth/Throat:     Mouth: Mucous membranes are moist.  Eyes:     Conjunctiva/sclera: Conjunctivae normal.  Cardiovascular:     Rate and Rhythm: Normal rate and regular rhythm.  Pulmonary:     Effort: Pulmonary effort is normal.     Breath sounds: Wheezing present.  Abdominal:     General: Abdomen is flat. There is no distension.  Musculoskeletal:        General: No swelling or tenderness.     Cervical back: Normal range of motion.  Neurological:     Mental Status: She is alert. Mental status is at baseline.  Psychiatric:        Mood and  Affect: Mood normal.        Behavior: Behavior normal.       The results of significant diagnostics from this hospitalization (including imaging, microbiology, ancillary and laboratory) are listed below for reference.     Microbiology: Recent Results (from the past 240 hour(s))  Respiratory Panel by RT PCR (Flu A&B, Covid) - Nasopharyngeal Swab     Status: None   Collection Time: 04/11/19  5:24 AM   Specimen: Nasopharyngeal Swab  Result Value Ref Range Status   SARS Coronavirus 2 by RT PCR NEGATIVE NEGATIVE Final    Comment: (NOTE) SARS-CoV-2 target nucleic acids are NOT DETECTED. The SARS-CoV-2 RNA is generally detectable in upper respiratoy specimens during the acute phase of infection. The lowest concentration of SARS-CoV-2 viral copies this assay can detect is 131 copies/mL. A negative result does not preclude SARS-Cov-2 infection and should not be used as the sole basis for treatment or other patient management decisions. A negative result may occur with  improper specimen collection/handling, submission of specimen other than nasopharyngeal swab, presence of  viral mutation(s) within the areas targeted by this assay, and inadequate number of viral copies (<131 copies/mL). A negative result must be combined with clinical observations, patient history, and epidemiological information. The expected result is Negative. Fact Sheet for Patients:  PinkCheek.be Fact Sheet for Healthcare Providers:  GravelBags.it This test is not yet ap proved or cleared by the [place] FDA and  has been authorized for detection and/or diagnosis of SARS-CoV-2 by FDA under an Emergency Use Authorization (EUA). This EUA will remain  in effect (meaning this test can be used) for the duration of the COVID-19 declaration under Section 564(b)(1) of the Act, 21 U.S.C. section 360bbb-3(b)(1), unless the authorization is terminated or revoked  sooner.    Influenza A by PCR NEGATIVE NEGATIVE Final   Influenza B by PCR NEGATIVE NEGATIVE Final    Comment: (NOTE) The Xpert Xpress SARS-CoV-2/FLU/RSV assay is intended as an aid in  the diagnosis of influenza from Nasopharyngeal swab specimens and  should not be used as a sole basis for treatment. Nasal washings and  aspirates are unacceptable for Xpert Xpress SARS-CoV-2/FLU/RSV  testing. Fact Sheet for Patients: PinkCheek.be Fact Sheet for Healthcare Providers: GravelBags.it This test is not yet approved or cleared by the [place] FDA and  has been authorized for detection and/or diagnosis of SARS-CoV-2 by  FDA under an Emergency Use Authorization (EUA). This EUA will remain  in effect (meaning this test can be used) for the duration of the  Covid-19 declaration under Section 564(b)(1) of the Act, 21  U.S.C. section 360bbb-3(b)(1), unless the authorization is  terminated or revoked. Performed at [facility], [place] [address] [place], [place] [address]   Urine culture     Status: None   Collection Time: 04/11/19  6:13 AM   Specimen: Urine, Catheterized  Result Value Ref Range Status   Specimen Description URINE, CATHETERIZED  Final   Special Requests NONE  Final   Culture   Final    NO GROWTH Performed at [facility], [address], [place], [place] [address]    Report Status 04/12/2019 FINAL  Final  MRSA PCR Screening     Status: None   Collection Time: 04/11/19  9:29 AM   Specimen: Nasal Mucosa; Nasopharyngeal  Result Value Ref Range Status   MRSA by PCR NEGATIVE NEGATIVE Final    Comment:        The GeneXpert MRSA Assay (FDA approved for NASAL specimens only), is one component of a comprehensive MRSA colonization surveillance program. It is not intended to diagnose MRSA infection nor to guide or monitor treatment for MRSA infections. Performed at [facility], [place] [address] [place], [place] [address]   Culture, respiratory (non-expectorated)     Status: None   Collection Time: 04/11/19 11:26 AM   Specimen: Tracheal Aspirate; Respiratory  Result Value Ref Range Status   Specimen Description TRACHEAL ASPIRATE  Final   Special Requests NONE  Final   Gram Stain   Final    MODERATE WBC PRESENT,BOTH PMN AND MONONUCLEAR MODERATE GRAM POSITIVE COCCI IN PAIRS AND CHAINS FEW GRAM POSITIVE RODS RARE GRAM NEGATIVE RODS    Culture   Final    Consistent with normal respiratory flora. Performed at [facility], [place] [address] [place], [place] [address]    Report Status 04/13/2019 FINAL  Final     Labs: BNP (last 3 results) Recent Labs    04/11/19 0520  BNP 348.6*   Basic  Metabolic Panel: Recent Labs  Lab 04/11/19 0520 04/11/19 0746 04/11/19 0849 04/11/19 1634 04/12/19 0414 04/12/19 1439 04/12/19 2022 04/12/19 2111 04/13/19 0243 04/14/19 0255 04/15/19 0324 04/15/19 0816 04/16/19 0418  NA   < >  --    < >  --  140   < >  --    < > 142 139 139 140  140 137  K   < >  --    < >  --  4.1   < >  --    < > 3.8 3.3* 3.7 3.3*  3.3* 3.6  CL   < >  --   --   --  110  --   --   --  108 107 103  --  102  CO2   < >  --   --   --  21*  --   --   --  22 23 22   --  23  GLUCOSE   < >  --   --   --  140*  --   --   --  131* 108* 96  --  95  BUN   < >  --   --   --  20  --   --   --  20 23* 23*  --  17  CREATININE   < > 1.05*  --   --  0.93  --   --   --  0.69 0.94 0.90  --  0.82  CALCIUM   < >  --   --   --  9.3  --   --   --  9.2 10.0 9.9  --  9.8  MG  --  2.3   < > 1.7 1.7  --  2.3  --   --   --  2.0  --  2.0  PHOS  --  8.0*  --  2.8 2.1*  --  2.6  --  2.6  --   --   --   --    < > = values in this interval not displayed.   Liver Function Tests: Recent Labs  Lab 04/11/19 0520  AST 82*  ALT 46*  ALKPHOS 172*  BILITOT 0.6  PROT 6.5  ALBUMIN 3.6   No results for input(s): LIPASE, AMYLASE in the last 168 hours. No results for input(s): AMMONIA in  the last 168 hours. CBC: Recent Labs  Lab 04/11/19 0520 04/11/19 0739 04/11/19 0746 04/11/19 0849 04/12/19 0414 04/12/19 1439 04/13/19 0911 04/14/19 0255 04/15/19 0324 04/15/19 0816 04/16/19 0418  WBC 17.3*   < > 26.9*   < > 15.6*  --  20.4* 20.1* 15.7*  --  17.1*  NEUTROABS 6.6  --  23.9*  --   --   --   --   --   --   --   --   HGB 14.1   < > 14.4   < > 12.6   < > 12.1 13.3 13.3 13.6  13.9 13.6  HCT 46.5*   < > 47.0*   < > 38.6   < > 37.7 41.0 40.5 40.0  41.0 41.9  MCV 99.1   < > 97.7   < > 92.3  --  93.1 92.6 91.0  --  91.1  PLT 266   < > 276   < > 191  --  199 213 220  --  237   < > = values in this  interval not displayed.   Cardiac Enzymes: No results for input(s): CKTOTAL, CKMB, CKMBINDEX, TROPONINI in the last 168 hours. BNP: Invalid input(s): POCBNP CBG: Recent Labs  Lab 04/15/19 2017 04/15/19 2359 04/16/19 0439 04/16/19 0815 04/16/19 1247  GLUCAP 108* 106* 95 95 125*   D-Dimer No results for input(s): DDIMER in the last 72 hours. Hgb A1c No results for input(s): HGBA1C in the last 72 hours. Lipid Profile No results for input(s): CHOL, HDL, LDLCALC, TRIG, CHOLHDL, LDLDIRECT in the last 72 hours. Thyroid function studies No results for input(s): TSH, T4TOTAL, T3FREE, THYROIDAB in the last 72 hours.  Invalid input(s): FREET3 Anemia work up No results for input(s): VITAMINB12, FOLATE, FERRITIN, TIBC, IRON, RETICCTPCT in the last 72 hours. Urinalysis    Component Value Date/Time   COLORURINE YELLOW 04/11/2019 0618   APPEARANCEUR HAZY (A) 04/11/2019 0618   LABSPEC 1.008 04/11/2019 0618   PHURINE 7.0 04/11/2019 0618   GLUCOSEU >=500 (A) 04/11/2019 0618   HGBUR NEGATIVE 04/11/2019 0618   BILIRUBINUR NEGATIVE 04/11/2019 0618   KETONESUR NEGATIVE 04/11/2019 0618   PROTEINUR >=300 (A) 04/11/2019 0618   UROBILINOGEN 0.2 12/06/2008 0800   NITRITE NEGATIVE 04/11/2019 0618   LEUKOCYTESUR NEGATIVE 04/11/2019 0618   Sepsis Labs Invalid input(s):  PROCALCITONIN,  WBC,  LACTICIDVEN Microbiology Recent Results (from the past 240 hour(s))  Respiratory Panel by RT PCR (Flu A&B, Covid) - Nasopharyngeal Swab     Status: None   Collection Time: 04/11/19  5:24 AM   Specimen: Nasopharyngeal Swab  Result Value Ref Range Status   SARS Coronavirus 2 by RT PCR NEGATIVE NEGATIVE Final    Comment: (NOTE) SARS-CoV-2 target nucleic acids are NOT DETECTED. The SARS-CoV-2 RNA is generally detectable in upper respiratoy specimens during the acute phase of infection. The lowest concentration of SARS-CoV-2 viral copies this assay can detect is 131 copies/mL. A negative result does not preclude SARS-Cov-2 infection and should not be used as the sole basis for treatment or other patient management decisions. A negative result may occur with  improper specimen collection/handling, submission of specimen other than nasopharyngeal swab, presence of viral mutation(s) within the areas targeted by this assay, and inadequate number of viral copies (<131 copies/mL). A negative result must be combined with clinical observations, patient history, and epidemiological information. The expected result is Negative. Fact Sheet for Patients:  PinkCheek.be Fact Sheet for Healthcare Providers:  GravelBags.it This test is not yet ap proved or cleared by the [place] FDA and  has been authorized for detection and/or diagnosis of SARS-CoV-2 by FDA under an Emergency Use Authorization (EUA). This EUA will remain  in effect (meaning this test can be used) for the duration of the COVID-19 declaration under Section 564(b)(1) of the Act, 21 U.S.C. section 360bbb-3(b)(1), unless the authorization is terminated or revoked sooner.    Influenza A by PCR NEGATIVE NEGATIVE Final   Influenza B by PCR NEGATIVE NEGATIVE Final    Comment: (NOTE) The Xpert Xpress SARS-CoV-2/FLU/RSV assay is intended as an aid in  the  diagnosis of influenza from Nasopharyngeal swab specimens and  should not be used as a sole basis for treatment. Nasal washings and  aspirates are unacceptable for Xpert Xpress SARS-CoV-2/FLU/RSV  testing. Fact Sheet for Patients: PinkCheek.be Fact Sheet for Healthcare Providers: GravelBags.it This test is not yet approved or cleared by the [place] FDA and  has been authorized for detection and/or diagnosis of SARS-CoV-2 by  FDA under an Emergency Use Authorization (EUA). This EUA will remain  in effect (meaning this test can be used) for the duration of the  Covid-19 declaration under Section 564(b)(1) of the Act, 21  U.S.C. section 360bbb-3(b)(1), unless the authorization is  terminated or revoked. Performed at [facility], [place] [address] [place], [place] [address]   Urine culture     Status: None   Collection Time: 04/11/19  6:13 AM   Specimen: Urine, Catheterized  Result Value Ref Range Status   Specimen Description URINE, CATHETERIZED  Final   Special Requests NONE  Final   Culture   Final    NO GROWTH Performed at [facility], [address] [place], [place] [address]    Report Status 04/12/2019 FINAL  Final  MRSA PCR Screening     Status: None   Collection Time: 04/11/19  9:29 AM   Specimen: Nasal Mucosa; Nasopharyngeal  Result Value Ref Range Status   MRSA by PCR NEGATIVE NEGATIVE Final    Comment:        The GeneXpert MRSA Assay (FDA approved for NASAL specimens only), is one component of a comprehensive MRSA colonization surveillance program. It is not intended to diagnose MRSA infection nor to guide or monitor treatment for MRSA infections. Performed at [facility], [place] [address], [place], [place] [address]   Culture, respiratory (non-expectorated)     Status: None   Collection Time: 04/11/19 11:26 AM   Specimen: Tracheal Aspirate; Respiratory  Result Value Ref Range  Status   Specimen Description TRACHEAL ASPIRATE  Final   Special Requests NONE  Final   Gram Stain   Final    MODERATE WBC PRESENT,BOTH PMN AND MONONUCLEAR MODERATE GRAM POSITIVE COCCI IN PAIRS AND CHAINS FEW GRAM POSITIVE RODS RARE GRAM NEGATIVE RODS    Culture   Final    Consistent with normal respiratory flora. Performed at [facility], [place] [address], [place], [place] [address]    Report Status 04/13/2019 FINAL  Final    Discharge Instructions     Discharge Instructions    (HEART FAILURE PATIENTS) Call MD:  Anytime you have any of the following symptoms: 1) 3 pound weight gain in 24 hours or 5 pounds in 1 week 2) shortness of breath, with or without a dry hacking cough 3) swelling in the hands, feet or stomach 4) if you have to sleep on extra pillows at night in order to breathe.   Complete by: As directed    Diet - low sodium heart healthy   Complete by: As directed    Diet Carb Modified   Complete by: As directed    Discharge instructions   Complete by: As directed    You were seen and examined in the hospital for heart failure and high blood pressure and cared for by a hospitalist as well as a cardiologist  Upon Discharge:  - You have had multiple medication adjustments, please see the printout for further instructions - Get lab work in one week prior to follow up with your cardiologist  - Record your blood pressure several times per week and bring this with you to your appointment Make an appointment with your primary care physician within 7 days Get lab work prior to your follow up appointment with your PCP Bring all home medications to your appointment to review Request that your primary physician go over all hospital tests and procedures/radiological results at the follow up.   Please get all hospital records sent to your physician by signing  a hospital release before you go home.     Read the complete instructions along with all the possible side  effects for all the medicines you take and that have been prescribed to you. Take any new medicines after you have completely understood and accept all the possible adverse reactions/side effects.   If you have any questions about your discharge medications or the care you received while you were in the hospital, you can call the unit and asked to speak with the hospitalist on call. Once you are discharged, your primary care physician will handle any further medical issues. Please note that NO REFILLS for any discharge medications will be authorized, as it is imperative that you return to your primary care physician (or establish a relationship with a primary care physician if you do not have one) for your aftercare needs so that they can reassess your need for medications and monitor your lab values.   Do not drive, operate heavy machinery, perform activities at heights, swimming or participation in water activities or provide baby sitting services if your were admitted for loss of consciousness/seizures or if you are on sedating medications including, but not limited to benzodiazepines, sleep medications, narcotic pain medications, etc., until you have been cleared to do so by a medical doctor.   Do not take more than prescribed medications.   Wear a seat belt while driving.  If you have smoked or chewed Tobacco in the last 2 years please stop smoking; also stop any regular Alcohol and/or any Recreational drug use including marijuana.  If you experience worsening of your admission symptoms or develop shortness of breath, chest pain, suicidal or homicidal thoughts or experience a life threatening emergency, you must seek medical attention immediately by calling 911 or calling your PCP immediately.   Heart Failure patients record your daily weight using the same scale at the same time of day   Complete by: As directed    Increase activity slowly   Complete by: As directed    STOP any activity that  causes chest pain, shortness of breath, dizziness, sweating, or exessive weakness   Complete by: As directed      Allergies as of 04/16/2019      Reactions   Compazine [prochlorperazine] Anaphylaxis, Swelling, Other (See Comments)   Throat swelling   Ondansetron Hcl Anaphylaxis, Swelling, Other (See Comments)   Throat swelling per patient      Medication List    STOP taking these medications   isosorbide mononitrate 60 MG 24 hr tablet Commonly known as: IMDUR     TAKE these medications   acetaminophen 500 MG tablet Commonly known as: TYLENOL Take 1,000 mg by mouth every 8 (eight) hours as needed for mild pain or headache.   ProAir HFA 108 (90 Base) MCG/ACT inhaler Generic drug: albuterol Inhale 2 puffs into the lungs every 6 (six) hours as needed for wheezing or shortness of breath.   albuterol (2.5 MG/3ML) 0.083% nebulizer solution Commonly known as: PROVENTIL Take 3 mLs (2.5 mg total) by nebulization every 6 (six) hours as needed for wheezing or shortness of breath.   anastrozole 1 MG tablet Commonly known as: ARIMIDEX Take 1 tablet (1 mg total) by mouth daily.   aspirin 81 MG chewable tablet Chew 81 mg by mouth daily.   atorvastatin 80 MG tablet Commonly known as: LIPITOR Take 1 tablet (80 mg total) by mouth daily at 6 PM.   cetirizine 10 MG tablet Commonly known as: ZYRTEC Take 10  mg by mouth daily as needed for allergies.   [employer] D3 125 MCG (5000 UT) capsule Generic drug: Cholecalciferol Take 5,000 Units by mouth daily.   Dulera 200-5 MCG/ACT Aero Generic drug: mometasone-formoterol Inhale 2 puffs into the lungs 2 (two) times daily.   furosemide 20 MG tablet Commonly known as: LASIX Take 1 tablet (20 mg total) by mouth daily. Start taking on: April 17, 2019   metoprolol succinate 50 MG 24 hr tablet Commonly known as: TOPROL-XL Take 1 tablet (50 mg total) by mouth 2 (two) times daily. Take with or immediately following a meal. What changed:    medication strength  how much to take  when to take this   nitroGLYCERIN 0.4 MG SL tablet Commonly known as: NITROSTAT Place 0.4 mg under the tongue every 5 (five) minutes as needed for chest pain.   sacubitril-valsartan 49-51 MG Commonly known as: ENTRESTO Take 1 tablet by mouth 2 (two) times daily.   spironolactone 25 MG tablet Commonly known as: ALDACTONE Take 1 tablet (25 mg total) by mouth daily. Start taking on: April 17, 2019   venlafaxine XR 75 MG 24 hr capsule Commonly known as: EFFEXOR-XR Take 1 capsule (75 mg total) by mouth daily with breakfast.      Follow-up Information    Pavelock, Ralene Bathe, MD. Call.   Specialty: Internal Medicine Why: to schedule an appointment within one week of transition home Contact information: [address] [place] [address] [phone]        Troy Sine, MD Follow up on 04/22/2019.   Specialty: Cardiology Why: Your follow-up appointment will be 04/22/2019 at 2 PM with Dr. Claiborne Billings. Contact information: [address] [place] [address] [phone]          Allergies  Allergen Reactions  . Compazine [Prochlorperazine] Anaphylaxis, Swelling and Other (See Comments)    Throat swelling  . Ondansetron Hcl Anaphylaxis, Swelling and Other (See Comments)    Throat swelling per patient    Time coordinating discharge: Over 30 minutes   SIGNED:   Harold Hedge, D.O. [facility] Pager: [phone]  04/16/2019, 6:20 PM

## 2019-04-16 NOTE — Plan of Care (Signed)

## 2019-04-16 NOTE — Progress Notes (Addendum)
The patient has been seen in conjunction with Kimberly Drown NP. All aspects of care have been considered and discussed. The patient has been personally interviewed, examined, and all clinical data has been reviewed.   She is doing well.  She is on guideline directed therapy.  As an outpatient if her blood pressure tolerates, BiDil needs to be reinstituted.  She is stable for discharge.  She will need follow-up with Dr. Claiborne Billings in 7 to 14 days with a basic metabolic panel.  The right femoral catheterization site is unremarkable.  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    Progress Note   Patient Name: [name] Date of Encounter: 04/16/2019  Primary Cardiologist: Dr. Claiborne Billings, MD  Subjective   Feeling well today.  Denies chest pain, shortness of breath.  Inpatient Medications    Scheduled Meds: . anastrozole  1 mg Oral Daily  . aspirin  81 mg Per Tube Daily  . atorvastatin  80 mg Per Tube q1800  . famotidine  20 mg Oral Daily  . furosemide  20 mg Oral Daily  . heparin  5,000 Units Subcutaneous Q8H  . insulin aspart  0-20 Units Subcutaneous Q4H  . loratadine  10 mg Oral Daily  . mouth rinse  15 mL Mouth Rinse BID  . metoprolol succinate  50 mg Oral BID  . mometasone-formoterol  2 puff Inhalation BID  . sacubitril-valsartan  1 tablet Oral BID  . sodium chloride flush  3 mL Intravenous Q12H  . sodium chloride flush  3 mL Intravenous Q12H  . spironolactone  25 mg Oral Daily  . umeclidinium bromide  1 puff Inhalation Daily  . venlafaxine XR  75 mg Oral Q breakfast   Continuous Infusions: . sodium chloride     PRN Meds: sodium chloride, acetaminophen (TYLENOL) oral liquid 160 mg/5 mL, acetaminophen, albuterol, hydrALAZINE, metoCLOPramide (REGLAN) injection, morphine injection, nitroGLYCERIN, sodium chloride flush   Vital Signs    Vitals:   04/16/19  0443 04/16/19 0609 04/16/19 0817 04/16/19 0834  BP: (!) 152/94     Pulse: 68  75   Resp:   18   Temp:   98.5 F (36.9 C)   TempSrc:   Oral   SpO2: 92%  92% 92%  Weight:  75.2 kg    Height:        Intake/Output Summary (Last 24 hours) at 04/16/2019 0934 Last data filed at 04/15/2019 2030 Gross per 24 hour  Intake 907.12 ml  Output 450 ml  Net 457.12 ml   Filed Weights   04/14/19 0517 04/15/19 0441 04/16/19 0609  Weight: 76 kg 74.7 kg 75.2 kg    Physical Exam   General: Well developed, well nourished, NAD Neck: Negative for carotid bruits. No JVD Lungs: Bilateral upper lobe wheezing. Breathing is unlabored. Cardiovascular: RRR with S1 S2. No murmurs Abdomen: Soft, non-tender, non-distended. No obvious abdominal masses. Extremities: No edema. DP pulses 2+ bilaterally Neuro: Alert and oriented. No focal deficits. No facial asymmetry. MAE spontaneously. Psych: Responds to questions appropriately with normal affect.    Labs    Chemistry Recent Labs  Lab 04/11/19 0520 04/11/19 0739 04/14/19 0255 04/14/19 0255 04/15/19 0324 04/15/19 0816 04/16/19 0418  NA 141   < > 139   < > 139 140  140 137  K 4.2   < > 3.3*   < > 3.7 3.3*  3.3* 3.6  CL 103   < > 107  --  103  --  102  CO2 24   < > 23  --  22  --  23  GLUCOSE 383*   < > 108*  --  96  --  95  BUN 6   < > 23*  --  23*  --  17  CREATININE 1.19*   < > 0.94  --  0.90  --  0.82  CALCIUM 9.3   < > 10.0  --  9.9  --  9.8  PROT 6.5  --   --   --   --   --   --   ALBUMIN 3.6  --   --   --   --   --   --   AST 82*  --   --   --   --   --   --   ALT 46*  --   --   --   --   --   --   ALKPHOS 172*  --   --   --   --   --   --   BILITOT 0.6  --   --   --   --   --   --   GFRNONAA 54*   < > >60  --  >60  --  >60  GFRAA >60   < > >60  --  >60  --  >60  ANIONGAP 14   < > 9  --  14  --  12   < > = values in this interval not displayed.     Hematology Recent Labs  Lab 04/14/19 0255 04/14/19 0255 04/15/19 0324  04/15/19 0816 04/16/19 0418  WBC 20.1*  --  15.7*  --  17.1*  RBC 4.43  --  4.45  --  4.60  HGB 13.3   < > 13.3 13.6  13.9 13.6  HCT 41.0   < > 40.5 40.0  41.0 41.9  MCV 92.6  --  91.0  --  91.1  MCH 30.0  --  29.9  --  29.6  MCHC 32.4  --  32.8  --  32.5  RDW 15.4  --  14.8  --  14.6  PLT 213  --  220  --  237   < > = values in this interval not displayed.    Cardiac EnzymesNo results for input(s): TROPONINI in the last 168 hours. No results for input(s): TROPIPOC in the last 168 hours.   BNP Recent Labs  Lab 04/11/19 0520  BNP 348.6*     DDimer No results for input(s): DDIMER in the last 168 hours.   Radiology    CARDIAC CATHETERIZATION  Result Date: 04/15/2019 [name] is a 49 y.o. female  XY:[id number] LOCATION:  FACILITY: [facility] PHYSICIAN: Quay Burow, M.D. 02/10/1971 DATE OF PROCEDURE:  04/15/2019 DATE OF DISCHARGE: CARDIAC CATHETERIZATION History obtained from chart review.49 y.o. female with a hx of renovascular HTN s/p L RA stent, NSTEMI 2017 w/ 65% LAD rx medically, HTN, breast CA, lymphoma of neck, GERD, Bell's palsy, COPD/ILD,who is being seen today for the evaluation of new CM w/ EF 25% and elevated troponinat the request of Dr Loanne Drilling.Prior exposure to cardiotoxic chemotherapy during Lymphoma and Breast cancer therapy.Previous nuclear study demonstrated apical infarction 2017. She was admitted with heart failure. Her enzymes were low and flat. 2D echo showed a decline in EF from the 40 to 45% range  to the 25 to 30% range. Because of this she was referred for right left heart cath to define her anatomy and physiology.   Ms. Carlyon has essentially normal coronary arteries suggesting that she has a nonischemic cardiomyopathy. Her LVEDP is low suggesting that she has been adequately diuresed. Selective angiography of her left renal artery stent shows this to be widely patent. She will need more aggressive pharmacologic therapy of her hypertension as well as her  left ventricular dysfunction with guideline directed optimal medical therapy including but not limited to Entresto, carvedilol (or other beta-blocker), spironolactone. If her EF does not improve in 3 months she may require consideration for ICD implantation for primary prevention. MYNX closure devices were used to obtain hemostasis in both the artery and vein. The patient left lab in stable condition. These results have been communicated to the attending cardiologist, Dr. Tamala Julian. Quay Burow. MD, [facility] 04/15/2019 8:49 AM   VAS US RENAL ARTERY DUPLEX  Result Date: 04/14/2019 ABDOMINAL VISCERAL High Risk Factors: Hypertension, hyperlipidemia. Vascular Interventions: Left renal stent. Limitations: Air/bowel gas, obesity and Coughing. Unable to hold respiration. Performing Technologist: Baldwin Crown ARDMS, RVT  Examination Guidelines: A complete evaluation includes B-mode imaging, spectral Doppler, color Doppler, and power Doppler as needed of all accessible portions of each vessel. Bilateral testing is considered an integral part of a complete examination. Limited examinations for reoccurring indications may be performed as noted.  Duplex Findings: +--------------------+--------+--------+------+--------------+ Mesenteric          PSV cm/sEDV cm/sPlaque   Comments    +--------------------+--------+--------+------+--------------+ Aorta Mid              95      18                        +--------------------+--------+--------+------+--------------+ Celiac Artery Origin  120      35                        +--------------------+--------+--------+------+--------------+ SMA Origin                                Not visualized +--------------------+--------+--------+------+--------------+    +------------------+--------+--------+-------+ Right Renal ArteryPSV cm/sEDV cm/sComment +------------------+--------+--------+-------+ Origin               74      22            +------------------+--------+--------+-------+ Proximal             91      21           +------------------+--------+--------+-------+ Mid                  54      16           +------------------+--------+--------+-------+ Distal               40      10           +------------------+--------+--------+-------+ +-----------------+--------+--------+-------+ Left Renal ArteryPSV cm/sEDV cm/sComment +-----------------+--------+--------+-------+ Origin             105      26           +-----------------+--------+--------+-------+ Proximal           101      31           +-----------------+--------+--------+-------+ Mid  48      10           +-----------------+--------+--------+-------+ Distal              23      9            +-----------------+--------+--------+-------+  Technologist observations: Left renal stent not visualized. +------------+--------+--------+----+-----------+--------+--------+----+ Right KidneyPSV cm/sEDV cm/sRI  Left KidneyPSV cm/sEDV cm/sRI   +------------+--------+--------+----+-----------+--------+--------+----+ Upper Pole  34      13      0.63Upper Pole 39      13      0.66 +------------+--------+--------+----+-----------+--------+--------+----+ Mid         25      8       0.68Mid        36      11      0.68 +------------+--------+--------+----+-----------+--------+--------+----+ Lower Pole  25      8       0.68Lower Pole 31      12      0.62 +------------+--------+--------+----+-----------+--------+--------+----+ Hilar       37      11      0.70Hilar      30      10      0.67 +------------+--------+--------+----+-----------+--------+--------+----+ +------------------+----+------------------+----+ Right Kidney          Left Kidney            +------------------+----+------------------+----+ RAR                   RAR                    +------------------+----+------------------+----+  RAR (manual)          RAR (manual)           +------------------+----+------------------+----+ Cortex                Cortex                 +------------------+----+------------------+----+ Cortex thickness      Corex thickness        +------------------+----+------------------+----+ Kidney length (cm)9.80Kidney length (cm)9.40 +------------------+----+------------------+----+  Summary: Renal:  Right: Normal size right kidney. No evidence of right renal artery        stenosis. Left:  Normal size of left kidney. No evidence of left renal artery        stenosis. Renal stent not visualized.  *See table(s) above for measurements and observations.  Diagnosing physician: Curt Jews MD  Electronically signed by Curt Jews MD on 04/14/2019 at 3:04:30 PM.    Final    Telemetry    04/16/19 NSR HR 70's - Personally Reviewed  ECG    No new tracing as of 04/16/2019- Personally Reviewed  Cardiac Studies   TTE: 04/11/19  1. Left ventricular ejection fraction, by visual estimation, is 25 to 30%. The left ventricle has severely decreased function. There is no left ventricular hypertrophy. 2. Mild dyskinesis of the left ventricular, entire apical segment. 3. Severe hypokinesis of the left ventricular, entire anteroseptal wall and anterior wall. 4. Severe hypokinesis of the left ventricular, entire inferolateral wall. 5. Elevated left atrial pressure. 6. Left ventricular diastolic parameters are consistent with Grade II diastolic dysfunction (pseudonormalization). 7. The left ventricle demonstrates global hypokinesis. 8. Global right ventricle has moderately reduced systolic function.The right ventricular size is normal. No increase in right ventricular wall thickness. 9. Left atrial size was mildly dilated. 10. Right atrial size was normal. 11. Trivial pericardial effusion  is present. 12. The mitral valve is normal in structure. Mild mitral valve regurgitation. 13. The tricuspid  valve is normal in structure. 14. The aortic valve is normal in structure. Aortic valve regurgitation is not visualized. 15. The pulmonic valve was not well visualized. Pulmonic valve regurgitation is not visualized. 16. Mildly elevated pulmonary artery systolic pressure. 17. The inferior vena cava is dilated in size with <50% respiratory variability, suggesting right atrial pressure of 15 mmHg. 18. The left ventricular function has critically worse. 19. No intracardiac thrombi or masses were visualized.  Patient Profile     49 y.o. female with a hx of renovascular HTN s/p L RA stent, NSTEMI 2017 w/ 65% LAD rx medically, HTN, breast CA, lymphoma of neck, GERD, Bell's palsy, COPD/ILD,who is being seen today for the evaluation of new CM w/ EF 25% and elevated troponinat the request of Dr Loanne Drilling.Prior exposure to cardiotoxic chemotherapy during Lymphoma and Breast cancer therapy.Previous nuclear study demonstrated apical infarction 2017.  Assessment & Plan    1. Acute on chronic systolic HF:  -EF noted at 25-30% this admission down from prior 45% in 2017. Etiology is unknown but does have known nonobstructive disease in the dLAD noted in 2017. Also with hx of chemotherapy.  -R/LHC performed 04/15/2019 with essentially normal coronary arteries suggesting nonischemic cardiomyopathy. LVEDP was low indicating adequate diuresis  -Entresto initiated along with carvedilol and spironolactone  -Attempted Bidil  2. Hx of CAD:  -Previous noted to have 50-70% in the dLAD in 2017.  -Treated with ASA, statin -No anginal symptoms   3. HTN:  -Stable, 152/94, 159/98, 146/81  -Continue Toprol-XL 50 mg -Plan to uptitrate Entresto to 49/51  4. HL:  -Last LDL on file, 111 08/2015  -Continue on high dose statin -Repeat lipid, LFT  5. Hypokalemia:  -Improved, K+ 3.6 today   6.  Bilateral upper lobe wheezing: -Denies shortness of breath however has bilateral upper lobe expiratory wheezing -On  Octavia Heir NP-[facility] Pager: [phone] 04/16/2019, 9:34 AM     For questions or updates, please contact   Please consult [email or web address] for contact info under Cardiology/STEMI.

## 2019-04-22 ENCOUNTER — Ambulatory Visit: Payer: Medicaid Other | Admitting: Cardiovascular Disease

## 2019-04-22 ENCOUNTER — Other Ambulatory Visit: Payer: Self-pay

## 2019-04-22 ENCOUNTER — Encounter: Payer: Self-pay | Admitting: Cardiovascular Disease

## 2019-04-22 DIAGNOSIS — I150 Renovascular hypertension: Secondary | ICD-10-CM

## 2019-04-22 DIAGNOSIS — I5043 Acute on chronic combined systolic (congestive) and diastolic (congestive) heart failure: Secondary | ICD-10-CM

## 2019-04-22 DIAGNOSIS — I10 Essential (primary) hypertension: Secondary | ICD-10-CM | POA: Diagnosis not present

## 2019-04-22 DIAGNOSIS — I428 Other cardiomyopathies: Secondary | ICD-10-CM

## 2019-04-22 DIAGNOSIS — E785 Hyperlipidemia, unspecified: Secondary | ICD-10-CM

## 2019-04-22 DIAGNOSIS — Z79899 Other long term (current) drug therapy: Secondary | ICD-10-CM | POA: Diagnosis not present

## 2019-04-22 DIAGNOSIS — I2510 Atherosclerotic heart disease of native coronary artery without angina pectoris: Secondary | ICD-10-CM

## 2019-04-22 MED ORDER — FUROSEMIDE 40 MG PO TABS: 40.0000 mg | ORAL_TABLET | Freq: Every day | ORAL | 3 refills | Status: DC

## 2019-04-22 NOTE — Progress Notes (Signed)
Cardiology Office Note    Date:  04/24/2019   ID:  [name] [birth year], MRN [id number]  PCP:  Kimberly Docker, MD  Cardiologist:  Kimberly Majestic, MD   No chief complaint on file.  Initial office visit with me  History of Present Illness:  [name] is a 49 y.o. female presents who for a 2 year cardiology follow-up evaluation.   [name] has a history of renal vascular hypertension and is status post remote left renal artery stenting, right-sided Bell's palsy in 2001, with residual right-sided facial weakness, and a history of lymphoma the neck.  She suffered a non-ST segment elevation myocardial infarction in June 2017.  Cardiac catheterization by [name] revealed a 65% stenosis in a tortuous segment and medical therapy was recommended.  She had seen [name] in follow-up of her hospitalization in July 2017.  She was seen by Kimberly Newton, NP for preoperative evaluation prior to breast cancer surgery for which she was needed to be off Plavix.  Subsequently, she was seen by [name].  An ECG showed T-wave inversion V2 through V6 and underwent a nuclear stress test which showed a prior apical infarct and minimal peri-infarct ischemia. EF was estimated at 43% with apical akineis.  I saw her for my initial evaluation with me in the office in January 2019.  At that time she denied any episodes of chest pain or shortness of breath.  On April 01, 2017  she underwent resection of 6 axillary lymph nodes.  She had been off Plavix for several months.   She is now felt to have breast cancer in remission.  She was recently hospitalized from January 17 through April 16, 2019 after presenting with acute hyper cardiac respiratory failure which was sudden in onset regarding the patient.  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  Covid testing was negative.   She self extubated herself on January 18 and was hypertensive.  She was treated with nitrates and Catapres and was started on IV steroids with ultimate transition to prednisone.  Bronchodilators were changed.  During her hypertensive emergency she was seen by cardiology and an echo showed marked reduction in LV function compared to 2017 when her EF was 45 to now being reduced to 25 to 30%.  She was aggressively diuresed and guideline directed medical therapy with Entresto and spironolactone were added to her regimen.  She subsequently underwent right and left heart cardiac catheterization on April 15, 2019 which showed patent coronary arteries and her PA systolic pressure was 33 mm.  Since hospital discharge last week, she is breathing better.  She still has periods of diaphoresis.  She still notes some shortness of breath with activity.  She presents for current cardiology follow-up evaluation.  Past Medical History:  Diagnosis Date  . Asthma    beginning of 2018 - came to ER    . Bell's palsy   . Breast cancer ([place]) 01/2017  . Cardiomegaly   . Coronary artery disease    NSTEMI 08/2015 (65% LAD; D1 50%)  . Family history of breast cancer   . Family history of prostate cancer   . 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 GERD (gastroesophageal reflux disease)   . Hypertension   . 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 Myocardial infarction ([place]) 2017  .  Renal artery stenosis ([facility])    s/p stent placment to left   . Respiratory failure ([place]) 03/2019    Past Surgical History:  Procedure Laterality Date  . AXILLARY LYMPH NODE DISSECTION Right 03/13/2017   Procedure: AXILLARY LYMPH NODE DISSECTION;  Surgeon: Kimberly Luna, MD;  Location: [facility];  Service: General;  Laterality: Right;  . BREAST RECONSTRUCTION WITH PLACEMENT OF TISSUE EXPANDER AND FLEX HD (ACELLULAR HYDRATED DERMIS) Right 01/30/2017   Procedure: RIGHT BREAST RECONSTRUCTION WITH PLACEMENT OF TISSUE EXPANDER AND  FLEX HD (ACELLULAR HYDRATED DERMIS);  Surgeon: Kimberly Going, DO;  Location: [facility];  Service: Plastics;  Laterality: Right;  . BREAST SURGERY     breast bx  07/2016  . CARDIAC CATHETERIZATION N/A 09/18/2015   Procedure: Left Heart Cath and Coronary Angiography;  Surgeon: Kimberly Man, MD;  Location: [facility];  Service: Cardiovascular;  Laterality: N/A;  . [place]   x2  . IR CV LINE INJECTION  07/24/2016  . MASTECTOMY WITH RADIOACTIVE SEED GUIDED EXCISION AND AXILLARY SENTINEL LYMPH NODE BIOPSY Right 01/30/2017   Procedure: RIGHT SIMPLE MASTECTOMY WITH  RADIOACTIVE SEED TARGETED RIGHT AXILLARY LYMPH NODE EXCISION AND RIGHT AXILLARY SENTINEL LYMPH NODE BIOPSY;  Surgeon: Kimberly Luna, MD;  Location: [facility];  Service: General;  Laterality: Right;  . PERIPHERAL VASCULAR CATHETERIZATION N/A 08/23/2014   Procedure: Renal Angiography;  Surgeon: Kimberly Prows, MD;  Location: [facility];  Service: Cardiovascular;  Laterality: N/A;  . RENAL ARTERY STENT Left 08/23/2014  . RIGHT/LEFT HEART CATH AND CORONARY ANGIOGRAPHY N/A 04/15/2019   Procedure: RIGHT/LEFT HEART CATH AND CORONARY ANGIOGRAPHY;  Surgeon: Kimberly Harp, MD;  Location: [facility];  Service: Cardiovascular;  Laterality: N/A;  . SIMPLE MASTECTOMY Right 01/30/2017  . SKIN BIOPSY  2000  . TISSUE EXPANDER PLACEMENT Right 10/08/2017   Procedure: REMOVAL OF RIGHT BREAST TISSUE EXPANDER;  Surgeon: Kimberly Going, DO;  Location: [facility];  Service: Plastics;  Laterality: Right;  Case should be 45 min  . TUBAL LIGATION      Current Medications: Outpatient Medications Prior to Visit  Medication Sig Dispense Refill  . acetaminophen (TYLENOL) 500 MG tablet Take 1,000 mg by mouth every 8 (eight) hours as needed for mild pain or headache.    . albuterol (PROAIR HFA) 108 (90 Base) MCG/ACT inhaler Inhale 2 puffs into the lungs every 6 (six) hours as needed for wheezing or shortness of breath.    Marland Kitchen  albuterol (PROVENTIL) (2.5 MG/3ML) 0.083% nebulizer solution Take 3 mLs (2.5 mg total) by nebulization every 6 (six) hours as needed for wheezing or shortness of breath. 75 mL 12  . anastrozole (ARIMIDEX) 1 MG tablet Take 1 tablet (1 mg total) by mouth daily. 90 tablet 3  . aspirin 81 MG chewable tablet Chew 81 mg by mouth daily.    Marland Kitchen atorvastatin (LIPITOR) 80 MG tablet Take 1 tablet (80 mg total) by mouth daily at 6 PM. 30 tablet 0  . cetirizine (ZYRTEC) 10 MG tablet Take 10 mg by mouth daily as needed for allergies.   11  . CVS D3 5000 units capsule Take 5,000 Units by mouth daily.  11  . metoprolol succinate (TOPROL-XL) 50 MG 24 hr tablet Take 1 tablet (50 mg total) by mouth 2 (two) times daily. Take with or immediately following a meal. 60 tablet 0  . mometasone-formoterol (DULERA) 200-5 MCG/ACT AERO Inhale 2 puffs into the lungs 2 (two) times daily.  13 g 0  . nitroGLYCERIN (NITROSTAT) 0.4 MG SL tablet Place 0.4 mg under the tongue every 5 (five) minutes as needed for chest pain.    . sacubitril-valsartan (ENTRESTO) 49-51 MG Take 1 tablet by mouth 2 (two) times daily. 60 tablet 0  . spironolactone (ALDACTONE) 25 MG tablet Take 1 tablet (25 mg total) by mouth daily. 30 tablet 0  . venlafaxine XR (EFFEXOR-XR) 75 MG 24 hr capsule Take 1 capsule (75 mg total) by mouth daily with breakfast. 30 capsule 11  . furosemide (LASIX) 20 MG tablet Take 1 tablet (20 mg total) by mouth daily. 30 tablet 0   No facility-administered medications prior to visit.     Allergies:   Compazine [prochlorperazine] and Ondansetron hcl   Social History   Socioeconomic History  . Marital status: Single    Spouse name: Not on file  . Number of children: 4  . Years of education: 9  . Highest education level: Not on file  Occupational History  . Occupation: Unemployed  Tobacco Use  . Smoking status: Current Every Day Smoker    Packs/day: 0.50    Years: 15.00    Pack years: 7.50    Types: Cigarettes  .  Smokeless tobacco: Never Used  . Tobacco comment: 6 cig per day  Substance and Sexual Activity  . Alcohol use: No    Alcohol/week: 0.0 standard drinks  . Drug use: No  . Sexual activity: Yes    Birth control/protection: None  Other Topics Concern  . Not on file  Social History Narrative   Lives at home with fiance and kids   Caffeine use: 2 cups coffee per day   4 Dr. Malachi Bonds (12oz) per day    Social Determinants of Health   Financial Resource Strain:   . Difficulty of Paying Living Expenses: Not on file  Food Insecurity:   . Worried About Charity fundraiser in the Last Year: Not on file  . Ran Out of Food in the Last Year: Not on file  Transportation Needs:   . Lack of Transportation (Medical): Not on file  . Lack of Transportation (Non-Medical): Not on file  Physical Activity:   . Days of Exercise per Week: Not on file  . Minutes of Exercise per Session: Not on file  Stress:   . Feeling of Stress : Not on file  Social Connections:   . Frequency of Communication with Friends and Family: Not on file  . Frequency of Social Gatherings with Friends and Family: Not on file  . Attends Religious Services: Not on file  . Active Member of Clubs or Organizations: Not on file  . Attends Archivist Meetings: Not on file  . Marital Status: Not on file     Family History:  The patient's family history includes Asthma in her father; Breast cancer in her maternal aunt; Cervical cancer in her mother; Coronary artery disease (age of onset: 55) in her father; Hypertension in her mother; Prostate cancer in her cousin.   ROS General: Negative; No fevers, chills, or night sweats;  HEENT: Negative; No changes in vision or hearing, sinus congestion, difficulty swallowing Pulmonary: Positive for asthma Cardiovascular: Negative; No chest pain, presyncope, syncope, palpitations GI: Negative; No nausea, vomiting, diarrhea, or abdominal pain GU: Negative; No dysuria, hematuria, or  difficulty voiding Musculoskeletal: Negative; no myalgias, joint pain, or weakness Hematologic/Oncology: Positive for breast CA Endocrine: Negative; no heat/cold intolerance; no diabetes Neuro: Negative; no changes in balance, headaches  Skin: Negative; No rashes or skin lesions Psychiatric: Negative; No behavioral problems, depression Sleep: Negative; No snoring, daytime sleepiness, hypersomnolence, bruxism, restless legs, hypnogognic hallucinations, no cataplexy Other comprehensive 14 point system review is negative.   PHYSICAL EXAM:   VS:  BP (!) 143/97   Pulse 74   Temp (!) 97.2 F (36.2 C)   Ht '5\' 3"'  (1.6 m)   Wt 172 lb 8 oz (78.2 kg)   SpO2 98%   BMI 30.56 kg/m     Repeat blood pressure by me was 140/88  Wt Readings from Last 3 Encounters:  04/22/19 172 lb 8 oz (78.2 kg)  04/16/19 165 lb 12.8 oz (75.2 kg)  04/13/18 171 lb 4.8 oz (77.7 kg)    General: Alert, oriented, no distress.  Skin: normal turgor, no rashes, warm and dry HEENT: Normocephalic, atraumatic. Pupils equal round and reactive to light; sclera anicteric; extraocular muscles intact;  Nose without nasal septal hypertrophy Mouth/Parynx benign; Mallinpatti scale 3 Neck: No JVD, no carotid bruits; normal carotid upstroke Lungs: clear to ausculatation and percussion; no wheezing or rales Chest wall: without tenderness to palpitation Heart: PMI not displaced, RRR, s1 s2 normal, 1/6 systolic murmur, no diastolic murmur, no rubs, gallops, thrills, or heaves Abdomen: soft, nontender; no hepatosplenomehaly, BS+; abdominal aorta nontender and not dilated by palpation. Back: no CVA tenderness Pulses 2+ Musculoskeletal: full range of motion, normal strength, no joint deformities Extremities: no clubbing cyanosis or edema, Homan's sign negative  Neurologic: grossly nonfocal; Cranial nerves grossly wnl Psychologic: Normal mood and affect    Studies/Labs Reviewed:   EKG:  EKG is ordered today.   ECG (independently  read by me): Normal sinus rhythm at 74 bpm.  Q waves inferiorly.  QTc interval increased at 479 ms.  No ectopy.  T wave abnormality in lead aVL  January 2019 ECG (independently read by me): Normal sinus rhythm at 66 bpm.  Poor anterior R-wave progression V1 to V3 with nonspecific T changes.  Normal intervals.  Recent Labs: BMP Latest Ref Rng & Units 04/16/2019 04/15/2019 04/15/2019  Glucose 70 - 99 mg/dL 95 - -  BUN 6 - 20 mg/dL 17 - -  Creatinine 0.44 - 1.00 mg/dL 0.82 - -  BUN/Creat Ratio 9 - 23 - - -  Sodium 135 - 145 mmol/L 137 140 140  Potassium 3.5 - 5.1 mmol/L 3.6 3.3(L) 3.3(L)  Chloride 98 - 111 mmol/L 102 - -  CO2 22 - 32 mmol/L 23 - -  Calcium 8.9 - 10.3 mg/dL 9.8 - -     Hepatic Function Latest Ref Rng & Units 04/11/2019 12/19/2016 10/11/2016  Total Protein 6.5 - 8.1 g/dL 6.5 7.2 6.3(L)  Albumin 3.5 - 5.0 g/dL 3.6 4.2 3.7  AST 15 - 41 U/L 82(H) 18 10  ALT 0 - 44 U/L 46(H) 13(L) 10  Alk Phosphatase 38 - 126 U/L 172(H) 99 85  Total Bilirubin 0.3 - 1.2 mg/dL 0.6 0.3 0.44  Bilirubin, Direct 0.1 - 0.5 mg/dL - - -    CBC Latest Ref Rng & Units 04/16/2019 04/15/2019 04/15/2019  WBC 4.0 - 10.5 K/uL 17.1(H) - -  Hemoglobin 12.0 - 15.0 g/dL 13.6 13.6 13.9  Hematocrit 36.0 - 46.0 % 41.9 40.0 41.0  Platelets 150 - 400 K/uL 237 - -   Lab Results  Component Value Date   MCV 91.1 04/16/2019   MCV 91.0 04/15/2019   MCV 92.6 04/14/2019   Lab Results  Component Value Date   TSH 1.192 09/18/2015  Lab Results  Component Value Date   HGBA1C 5.5 04/11/2019     BNP    Component Value Date/Time   BNP 348.6 (H) 04/11/2019 0520    ProBNP No results found for: PROBNP   Lipid Panel     Component Value Date/Time   CHOL 183 09/18/2015 0340   TRIG 380 (H) 04/11/2019 0746   HDL 47 09/18/2015 0340   CHOLHDL 3.9 09/18/2015 0340   VLDL 25 09/18/2015 0340   LDLCALC 111 (H) 09/18/2015 0340     RADIOLOGY: CARDIAC CATHETERIZATION  Result Date: 04/15/2019 [name] is  a 49 y.o. female  [id number] LOCATION:  FACILITY: [facility] PHYSICIAN: Quay Burow, M.D. December 24, 1970 DATE OF PROCEDURE:  04/15/2019 DATE OF DISCHARGE: CARDIAC CATHETERIZATION History obtained from chart review.49 y.o. female with a hx of renovascular HTN s/p L RA stent, NSTEMI 2017 w/ 65% LAD rx medically, HTN, breast CA, lymphoma of neck, GERD, Bell's palsy, COPD/ILD,who is being seen today for the evaluation of new CM w/ EF 25% and elevated troponinat the request of Dr Loanne Drilling.Prior exposure to cardiotoxic chemotherapy during Lymphoma and Breast cancer therapy.Previous nuclear study demonstrated apical infarction 2017. She was admitted with heart failure. Her enzymes were low and flat. 2D echo showed a decline in EF from the 40 to 45% range to the 25 to 30% range. Because of this she was referred for right left heart cath to define her anatomy and physiology.   [name] has essentially normal coronary arteries suggesting that she has a nonischemic cardiomyopathy. Her LVEDP is low suggesting that she has been adequately diuresed. Selective angiography of her left renal artery stent shows this to be widely patent. She will need more aggressive pharmacologic therapy of her hypertension as well as her left ventricular dysfunction with guideline directed optimal medical therapy including but not limited to Entresto, carvedilol (or other beta-blocker), spironolactone. If her EF does not improve in 3 months she may require consideration for ICD implantation for primary prevention. MYNX closure devices were used to obtain hemostasis in both the artery and vein. The patient left lab in stable condition. These results have been communicated to the attending cardiologist, Dr. Tamala Julian. Quay Burow. MD, [facility] 04/15/2019 8:49 AM   DG CHEST PORT 1 VIEW  Result Date: 04/12/2019 CLINICAL DATA:  Respiratory distress EXAM: PORTABLE CHEST 1 VIEW COMPARISON:  April 12, 2019 FINDINGS: The patient has been extubated. There are  well-positioned left-sided subclavian and IJ central venous catheters. The heart size remains enlarged. There is vascular congestion. Mild interstitial edema is noted. There is no pneumothorax. No focal infiltrate. No acute osseous abnormality IMPRESSION: 1. Lines and tubes as above. 2. Cardiomegaly with improving interstitial edema. Electronically Signed   By: Constance Holster M.D.   On: 04/12/2019 21:15   DG Chest Port 1 View  Result Date: 04/12/2019 CLINICAL DATA:  ARDS. EXAM: PORTABLE CHEST 1 VIEW COMPARISON:  Radiographs yesterday, chest CT 10/07/2017 FINDINGS: Endotracheal tube tip 4.2 cm from the carina. Enteric tube tip below the diaphragm. Left chest port remains in place. Left internal jugular central line remains in place. Decreasing interstitial opacities from prior exam. Improving cardiomegaly. No pneumothorax. No large pleural effusion. No acute osseous abnormalities are seen. IMPRESSION: 1. Decreasing interstitial opacities from prior exam likely resolving pulmonary edema. Improving cardiomegaly. 2. Support apparatus unchanged. Electronically Signed   By: Keith Rake M.D.   On: 04/12/2019 05:47   DG CHEST PORT 1 VIEW  Result Date: 04/11/2019 CLINICAL DATA:  Respiratory distress. EXAM:  PORTABLE CHEST 1 VIEW COMPARISON:  One-view chest x-ray 04/10/2018 at 6:09 a.m. FINDINGS: A tracheal tube is stable, 4.5 cm above the carina. Left subclavian port is in place. NG tube courses off the inferior border the film. A new left IJ line is place. Tip is in the distal SVC, just above the cavoatrial junction. Diffuse interstitial stable. IMPRESSION: 1. Interval placement of left IJ line without radiographic evidence for complication. 2. The remaining support apparatus is stable. 3. Stable diffuse interstitial pattern. Electronically Signed   By: San Morelle M.D.   On: 04/11/2019 10:59   DG Chest Port 1 View  Result Date: 04/11/2019 CLINICAL DATA:  Respiratory distress EXAM: PORTABLE  CHEST 1 VIEW COMPARISON:  Radiograph same day FINDINGS: Insertion of endotracheal tube 4.3 cm from carina. NG tube extends into the stomach with side port below the GE junction. Central venous line again noted. Stable cardiac silhouette. Fine interstitial pattern throughout the lungs. Slight improvement aeration of the lungs. IMPRESSION: 1. Endotracheal tube in good position.  NG tube in good position. 2. Some improved aeration to lungs. 3. Diffuse interstitial pattern again noted Electronically Signed   By: Suzy Bouchard M.D.   On: 04/11/2019 06:33   DG Chest Portable 1 View  Result Date: 04/11/2019 CLINICAL DATA:  Asthma.  Short of breath. EXAM: PORTABLE CHEST 1 VIEW COMPARISON:  10/07/2017 FINDINGS: No change in central venous line. Enlarged cardiac silhouette. There is increased interstitial markings throughout the lungs as well as peripheral linear interstitial markings. Fine airspace disease in the lungs. Potential small effusions. IMPRESSION: Interstitial edema and pulmonary edema. Apparent increased interstitial lung disease compared to prior. Small effusions. Mild cardiomegaly Electronically Signed   By: Suzy Bouchard M.D.   On: 04/11/2019 05:55   ECHOCARDIOGRAM COMPLETE  Result Date: 04/11/2019   ECHOCARDIOGRAM REPORT   Patient Name:   [name] Date of Exam: 04/11/2019 Medical Rec #:  [id number]           Height:       62.0 in Accession #:    [id number]          Weight:       165.0 lb Date of Birth:  [birth year]           BSA:          1.76 m Patient Age:    78 years            BP:           147/73 mmHg Patient Gender: F                   HR:           87 bpm. Exam Location:  Inpatient Procedure: 2D Echo, Color Doppler, Cardiac Doppler and Intracardiac            Opacification Agent Indications:    CAD Native Vessel i25.10  History:        Patient has prior history of Echocardiogram examinations, most                 recent 09/18/2015. CAD; Risk Factors:Hypertension.  Sonographer:     Raquel Sarna Senior RDCS Referring Phys: [id number] Francesca Jewett  Sonographer Comments: Echo performed with patient supine and on artificial respirator. IMPRESSIONS  1. Left ventricular ejection fraction, by visual estimation, is 25 to 30%. The left ventricle has severely decreased function. There is no left ventricular hypertrophy.  2. Mild dyskinesis of the left ventricular, entire  apical segment.  3. Severe hypokinesis of the left ventricular, entire anteroseptal wall and anterior wall.  4. Severe hypokinesis of the left ventricular, entire inferolateral wall.  5. Elevated left atrial pressure.  6. Left ventricular diastolic parameters are consistent with Grade II diastolic dysfunction (pseudonormalization).  7. The left ventricle demonstrates global hypokinesis.  8. Global right ventricle has moderately reduced systolic function.The right ventricular size is normal. No increase in right ventricular wall thickness.  9. Left atrial size was mildly dilated. 10. Right atrial size was normal. 11. Trivial pericardial effusion is present. 12. The mitral valve is normal in structure. Mild mitral valve regurgitation. 13. The tricuspid valve is normal in structure. 14. The aortic valve is normal in structure. Aortic valve regurgitation is not visualized. 15. The pulmonic valve was not well visualized. Pulmonic valve regurgitation is not visualized. 16. Mildly elevated pulmonary artery systolic pressure. 17. The inferior vena cava is dilated in size with <50% respiratory variability, suggesting right atrial pressure of 15 mmHg. 18. The left ventricular function has critically worse. 19. No intracardiac thrombi or masses were visualized. In comparison to the previous echocardiogram(s): The left ventricular function is significantly worse. The left ventricular wall motion abnormalities are significantly worse. FINDINGS  Left Ventricle: Left ventricular ejection fraction, by visual estimation, is 25 to 30%. The left ventricle has  severely decreased function. Mild dyskinesis of the left ventricular, entire apical segment. Severe hypokinesis of the left ventricular, entire anteroseptal wall and anterior wall. Severe hypokinesis of the left ventricular, entire inferolateral wall. The left ventricle demonstrates global hypokinesis. There is no left ventricular hypertrophy. Left ventricular diastolic parameters are consistent with Grade II diastolic dysfunction (pseudonormalization). Elevated left atrial pressure. Right Ventricle: The right ventricular size is normal. No increase in right ventricular wall thickness. Global RV systolic function is has moderately reduced systolic function. The tricuspid regurgitant velocity is 2.40 m/s, and with an assumed right atrial pressure of 15 mmHg, the estimated right ventricular systolic pressure is mildly elevated at 38.0 mmHg. Left Atrium: Left atrial size was mildly dilated. Right Atrium: Right atrial size was normal in size Pericardium: Trivial pericardial effusion is present. Mitral Valve: The mitral valve is normal in structure. Mild mitral valve regurgitation. Tricuspid Valve: The tricuspid valve is normal in structure. Tricuspid valve regurgitation is mild. Aortic Valve: The aortic valve is normal in structure. Aortic valve regurgitation is not visualized. Pulmonic Valve: The pulmonic valve was not well visualized. Pulmonic valve regurgitation is not visualized. Pulmonic regurgitation is not visualized. Aorta: The aortic root and ascending aorta are structurally normal, with no evidence of dilitation. Venous: The inferior vena cava is dilated in size with less than 50% respiratory variability, suggesting right atrial pressure of 15 mmHg. IAS/Shunts: No atrial level shunt detected by color flow Doppler. Additional Comments: No intracardiac thrombi or masses were visualized.  LEFT VENTRICLE PLAX 2D LVIDd:         5.10 cm       Diastology LVIDs:         4.10 cm       LV e' lateral:   6.09 cm/s LV PW:          1.30 cm       LV E/e' lateral: 14.5 LV IVS:        0.90 cm       LV e' medial:    5.22 cm/s LVOT diam:     2.10 cm       LV E/e' medial:  16.9  LV SV:         50 ml LV SV Index:   27.01 LVOT Area:     3.46 cm  LV Volumes (MOD) LV area d, A4C:    27.80 cm LV area s, A4C:    22.80 cm LV major d, A4C:   7.89 cm LV major s, A4C:   7.36 cm LV vol d, MOD A4C: 82.3 ml LV vol s, MOD A4C: 60.5 ml LV SV MOD A4C:     82.3 ml RIGHT VENTRICLE RV S prime:     7.62 cm/s TAPSE (M-mode): 1.2 cm LEFT ATRIUM             Index       RIGHT ATRIUM           Index LA diam:        4.10 cm 2.33 cm/m  RA Area:     11.20 cm LA Vol (A2C):   51.0 ml 28.95 ml/m RA Volume:   22.40 ml  12.72 ml/m LA Vol (A4C):   41.5 ml 23.56 ml/m LA Biplane Vol: 48.4 ml 27.47 ml/m  AORTIC VALVE LVOT Vmax:   86.80 cm/s LVOT Vmean:  60.900 cm/s LVOT VTI:    0.130 m  AORTA Ao Root diam: 2.60 cm MITRAL VALVE                        TRICUSPID VALVE MV Area (PHT): 3.48 cm             TR Peak grad:   23.0 mmHg MV PHT:        63.22 msec           TR Vmax:        240.00 cm/s MV Decel Time: 218 msec MV E velocity: 88.30 cm/s 103 cm/s  SHUNTS MV A velocity: 86.10 cm/s 70.3 cm/s Systemic VTI:  0.13 m MV E/A ratio:  1.03       1.5       Systemic Diam: 2.10 cm  Dani Gobble Croitoru MD Electronically signed by Sanda Klein MD Signature Date/Time: 04/11/2019/11:57:02 AM    Final    VAS US RENAL ARTERY DUPLEX  Result Date: 04/14/2019 ABDOMINAL VISCERAL High Risk Factors: Hypertension, hyperlipidemia. Vascular Interventions: Left renal stent. Limitations: Air/bowel gas, obesity and Coughing. Unable to hold respiration. Performing Technologist: Baldwin Crown ARDMS, RVT  Examination Guidelines: A complete evaluation includes B-mode imaging, spectral Doppler, color Doppler, and power Doppler as needed of all accessible portions of each vessel. Bilateral testing is considered an integral part of a complete examination. Limited examinations for reoccurring indications  may be performed as noted.  Duplex Findings: +--------------------+--------+--------+------+--------------+ Mesenteric          PSV cm/sEDV cm/sPlaque   Comments    +--------------------+--------+--------+------+--------------+ Aorta Mid              95      18                        +--------------------+--------+--------+------+--------------+ Celiac Artery Origin  120      35                        +--------------------+--------+--------+------+--------------+ SMA Origin                                Not visualized +--------------------+--------+--------+------+--------------+    +------------------+--------+--------+-------+  Right Renal ArteryPSV cm/sEDV cm/sComment +------------------+--------+--------+-------+ Origin               74      22           +------------------+--------+--------+-------+ Proximal             91      21           +------------------+--------+--------+-------+ Mid                  54      16           +------------------+--------+--------+-------+ Distal               40      10           +------------------+--------+--------+-------+ +-----------------+--------+--------+-------+ Left Renal ArteryPSV cm/sEDV cm/sComment +-----------------+--------+--------+-------+ Origin             105      26           +-----------------+--------+--------+-------+ Proximal           101      31           +-----------------+--------+--------+-------+ Mid                 48      10           +-----------------+--------+--------+-------+ Distal              23      9            +-----------------+--------+--------+-------+  Technologist observations: Left renal stent not visualized. +------------+--------+--------+----+-----------+--------+--------+----+ Right KidneyPSV cm/sEDV cm/sRI  Left KidneyPSV cm/sEDV cm/sRI   +------------+--------+--------+----+-----------+--------+--------+----+ Upper Pole  34      13       0.63Upper Pole 39      13      0.66 +------------+--------+--------+----+-----------+--------+--------+----+ Mid         25      8       0.68Mid        36      11      0.68 +------------+--------+--------+----+-----------+--------+--------+----+ Lower Pole  25      8       0.68Lower Pole 31      12      0.62 +------------+--------+--------+----+-----------+--------+--------+----+ Hilar       37      11      0.70Hilar      30      10      0.67 +------------+--------+--------+----+-----------+--------+--------+----+ +------------------+----+------------------+----+ Right Kidney          Left Kidney            +------------------+----+------------------+----+ RAR                   RAR                    +------------------+----+------------------+----+ RAR (manual)          RAR (manual)           +------------------+----+------------------+----+ Cortex                Cortex                 +------------------+----+------------------+----+ Cortex thickness      Corex thickness        +------------------+----+------------------+----+ Kidney length (cm)9.80Kidney length (cm)9.40 +------------------+----+------------------+----+  Summary: Renal:  Right: Normal size right kidney. No evidence  of right renal artery        stenosis. Left:  Normal size of left kidney. No evidence of left renal artery        stenosis. Renal stent not visualized.  *See table(s) above for measurements and observations.  Diagnosing physician: Curt Jews MD  Electronically signed by Curt Jews MD on 04/14/2019 at 3:04:30 PM.    Final      Additional studies/ records that were reviewed today include:   I extensively reviewed her most recent hospitalization, right and left heart cardiac catheterization, as well as echo data  ASSESSMENT:    1. Nonischemic cardiomyopathy ([place])   2. Acute on chronic combined systolic and diastolic CHF (congestive heart failure) ([place])   3. Essential  hypertension   4. Renovascular hypertension   5. CAD in native artery   6. Hyperlipidemia with target LDL less than 70   7. 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  She suffered a non-ST segment elevation MI in June 2017.  Cardiac catheterization revealed a  65% stenosis in a very tortuous segment for which medical therapy was recommended.  Troponin was mildly elevated at 1.57.  I had seen her on only one occasion in the office in 2019.  She was subsequently found to have breast CA which is now felt to be in remission.  I reviewed her most recent hospitalization for acute hypercarbic respiratory failure requiring intubation.  An echo Doppler study during that evaluation now showed an EF of 25 to 30% with grade 2 diastolic dysfunction.  There was mild elevation of right heart pressures.  Cardiac catheterization did not demonstrate any significant coronary obstructive disease and she had mild increased PA systolic pressure at 33 mm.  During her hospitalization she was started on Entresto 49/51 mg twice a day, spironolactone 25 mg daily, Toprol-XL 50 mg twice a day and has also been on furosemide 20 mg.  With her continued shortness of breath and blood pressure elevation I have recommended slight titration of furosemide to 40 mg.  She has only been on Entresto for 2 weeks and for this reason I did not further titrate Entresto to maximum dosing at 97/103 mg twice daily at this time.  In 3 weeks I am recommending a follow-up chemistry profile, proBNP, as well as TSH level.  I will see her back in the office in 4 weeks, and if at that time renal function and blood pressure allow, she will be titrated up to maximal Entresto dosing with plans for future echocardiographic evaluation after several  months of increased therapy to see if there has been any significant improvement in LV function.     Medication Adjustments/Labs and Tests Ordered: Current medicines are reviewed at length with the patient today.  Concerns regarding medicines are outlined above.  Medication changes, Labs and Tests ordered today are listed in the Patient Instructions below. Patient Instructions  Medication Instructions:  INCREASE LASIX TO 40MG DAILY *If you need a refill on your cardiac medications before your next appointment, please call your pharmacy*  Lab Work: TSH, CMET, CBC, PRO BNP IN 3 WEEKS If you have labs (blood work) drawn today and your tests are completely normal, you will receive your results only by: Marland Kitchen MyChart Message (if you have MyChart) OR .  A paper copy in the mail If you have any lab test that is abnormal or we need to change your treatment, we will call you to review the results.  Follow-Up: At [facility], you and your health needs are our priority.  As part of our continuing mission to provide you with exceptional heart care, we have created designated Provider Care Teams.  These Care Teams include your primary Cardiologist (physician) and Advanced Practice Providers (APPs -  Physician Assistants and Nurse Practitioners) who all work together to provide you with the care you need, when you need it.  Your next appointment:   4-6 week(s)  The format for your next appointment:   In Person  Provider:   Shelva Majestic, MD     Signed, Kimberly Majestic, MD  04/24/2019 1:19 PM    [facility] [address], [place], [place], [place]  [address] Phone: [phone]

## 2019-04-22 NOTE — Patient Instructions (Signed)
Medication Instructions:  INCREASE LASIX TO 40MG  DAILY *If you need a refill on your cardiac medications before your next appointment, please call your pharmacy*  Lab Work: TSH, CMET, CBC, PRO BNP IN 3 WEEKS If you have labs (blood work) drawn today and your tests are completely normal, you will receive your results only by: Marland Kitchen MyChart Message (if you have MyChart) OR . A paper copy in the mail If you have any lab test that is abnormal or we need to change your treatment, we will call you to review the results.  Follow-Up: At [facility], you and your health needs are our priority.  As part of our continuing mission to provide you with exceptional heart care, we have created designated Provider Care Teams.  These Care Teams include your primary Cardiologist (physician) and Advanced Practice Providers (APPs -  Physician Assistants and Nurse Practitioners) who all work together to provide you with the care you need, when you need it.  Your next appointment:   4-6 week(s)  The format for your next appointment:   In Person  Provider:   Shelva Majestic, MD

## 2019-04-24 ENCOUNTER — Encounter: Payer: Self-pay | Admitting: Cardiovascular Disease

## 2019-05-25 MED ORDER — SPIRONOLACTONE 25 MG PO TABS: 25.0000 mg | ORAL_TABLET | Freq: Every day | ORAL | 0 refills | Status: DC

## 2019-05-25 MED ORDER — SACUBITRIL-VALSARTAN 49-51 MG PO TABS: 1.0000 | ORAL_TABLET | Freq: Two times a day (BID) | ORAL | 0 refills | Status: DC

## 2019-06-15 ENCOUNTER — Ambulatory Visit: Payer: Medicaid Other | Admitting: Cardiovascular Disease

## 2019-06-22 ENCOUNTER — Other Ambulatory Visit: Payer: Self-pay | Admitting: Cardiovascular Disease

## 2019-06-22 ENCOUNTER — Other Ambulatory Visit: Payer: Self-pay

## 2019-06-22 MED ORDER — SPIRONOLACTONE 25 MG PO TABS: 25.0000 mg | ORAL_TABLET | Freq: Every day | ORAL | 1 refills | Status: DC

## 2019-06-22 MED ORDER — SACUBITRIL-VALSARTAN 49-51 MG PO TABS: 1.0000 | ORAL_TABLET | Freq: Two times a day (BID) | ORAL | 1 refills | Status: DC

## 2019-06-22 NOTE — Telephone Encounter (Signed)
Spoke to patient's daughter.Advised mother is past due to see Dr.Kelly.She stated she is doing good.Dr.Kelly does not have any openings.Appointment scheduled with Almyra Deforest PA 06/30/19 at 11:20 am.Entresto and Aldactone refills sent to pharmacy.

## 2019-06-29 ENCOUNTER — Encounter: Payer: Self-pay | Admitting: Cardiovascular Disease

## 2019-07-02 ENCOUNTER — Ambulatory Visit: Payer: Medicaid Other | Admitting: Physician Assistant

## 2019-07-02 ENCOUNTER — Encounter: Payer: Self-pay | Admitting: Physician Assistant

## 2019-07-02 ENCOUNTER — Other Ambulatory Visit: Payer: Self-pay

## 2019-07-02 ENCOUNTER — Other Ambulatory Visit: Payer: Self-pay | Admitting: Physician Assistant

## 2019-07-02 VITALS — BP 138/88 | HR 91 | Temp 97.5°F | Ht 63.0 in | Wt 173.8 lb

## 2019-07-02 DIAGNOSIS — I428 Other cardiomyopathies: Secondary | ICD-10-CM

## 2019-07-02 DIAGNOSIS — E785 Hyperlipidemia, unspecified: Secondary | ICD-10-CM | POA: Diagnosis not present

## 2019-07-02 DIAGNOSIS — I701 Atherosclerosis of renal artery: Secondary | ICD-10-CM

## 2019-07-02 DIAGNOSIS — I2510 Atherosclerotic heart disease of native coronary artery without angina pectoris: Secondary | ICD-10-CM | POA: Diagnosis not present

## 2019-07-02 NOTE — Patient Instructions (Addendum)
Medication Instructions:  Your physician recommends that you continue on your current medications as directed. Please refer to the Current Medication list given to you today.  *If you need a refill on your cardiac medications before your next appointment, please call your pharmacy*  Lab Work: You will need to have labs drawn (blood work) today that was ordered from a previous office visit.   CBC  CMET  Pro BNP  TSH If you have labs (blood work) drawn today and your tests are completely normal, you will receive your results only by: Marland Kitchen MyChart Message (if you have MyChart) OR . A paper copy in the mail If you have any lab test that is abnormal or we need to change your treatment, we will call you to review the results.  Testing/Procedures: NONE ordered at this time of appointment   Follow-Up: At [facility], you and your health needs are our priority.  As part of our continuing mission to provide you with exceptional heart care, we have created designated Provider Care Teams.  These Care Teams include your primary Cardiologist (physician) and Advanced Practice Providers (APPs -  Physician Assistants and Nurse Practitioners) who all work together to provide you with the care you need, when you need it.  Your next appointment:   3 week(s)  The format for your next appointment:   In Person  Provider:   Almyra Deforest, PA-C  Other Instructions Medication Samples have been provided to the patient.  Drug name: Delene Loll       Strength: 49mg -51mg         Qty: 28 tablets  LOT: [id number]  Exp.Date: 12/2020  Dosing instructions: Take 1 tablet 2 times a daily  The patient has been instructed regarding the correct time, dose, and frequency of taking this medication, including desired effects and most common side effects.   Jacqulynn Cadet 11:47 AM  07/02/2019

## 2019-07-02 NOTE — Progress Notes (Signed)
Cardiology Office Note:    Date:  07/04/2019   ID:  [name], DOB 1971-02-25, MRN [id number]  PCP:  Javier Docker, MD  Cardiologist:  Shelva Majestic, MD  Electrophysiologist:  None   Referring MD: Javier Docker, MD   Chief Complaint  Patient presents with  . Follow-up    seen for Dr. Claiborne Billings    History of Present Illness:    [name] is a 49 y.o. female with a hx of CAD, HLD, renal artery hypertension s/p left renal artery stenting, right-sided Bell's palsy 2001, lymphoma of the neck, and history of breast cancer.  He had a NSTEMI in June 2017.  Cardiac catheterization the time showed 65% stenosis in tortuous segment, medical therapy was recommended.  Myoview obtained on 12/30/2016 showed EF 43%, medium defect of severe severity present in the apical anterior, apical inferior and apex location, overall intermediate risk study.  Patient was admitted in January 2021 with acute respiratory failure.  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  Covid test was negative.  She was treated for hypertensive emergency.  Echocardiogram showed a drop in ejection fraction to 25 to 30%.  She underwent aggressive diuresis and heart failure medication titration.  She subsequently underwent left and right heart cath that showed patent coronary arteries, PA systolic pressure 33 mmHg.  She was last seen by Dr. Claiborne Billings on 04/14/2019 who felt she continued to have breathing issue and increased Lasix to 40 mg daily.  Patient presents today for cardiology office visit,, she has no lower extremity edema, orthopnea or PND.  She denies any recent chest discomfort.  Unfortunately, she says she has not taken her Entresto for 1.5 months.  Every time she goes to her pharmacy, her pharmacist told her that they are waiting for insurance approval.  I discussed with our clinical pharmacist, her Medicaid should cover Entresto as a preferred drug.  We  suspect it requires prior authorization.  We will try to complete prior authorization as soon as possible.  Otherwise I will bring her back in 3 weeks for further titration of medication.   Past Medical History:  Diagnosis Date  . Asthma    beginning of 2018 - came to ER    . Bell's palsy   . Breast cancer ([place]) 01/2017  . Cardiomegaly   . Coronary artery disease    NSTEMI 08/2015 (65% LAD; D1 50%)  . Family history of breast cancer   . Family history of prostate cancer   . 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 GERD (gastroesophageal reflux disease)   . Hypertension   . 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 Myocardial infarction ([place]) 2017  . Renal artery stenosis ([facility])    s/p stent placment to left   . Respiratory failure ([place]) 03/2019    Past Surgical History:  Procedure Laterality Date  . AXILLARY LYMPH NODE DISSECTION Right 03/13/2017   Procedure: AXILLARY LYMPH NODE DISSECTION;  Surgeon: Erroll Luna, MD;  Location: [facility];  Service: General;  Laterality: Right;  . BREAST RECONSTRUCTION WITH PLACEMENT OF TISSUE EXPANDER AND FLEX HD (ACELLULAR HYDRATED DERMIS) Right 01/30/2017   Procedure: RIGHT BREAST RECONSTRUCTION WITH PLACEMENT OF TISSUE EXPANDER AND FLEX HD (ACELLULAR HYDRATED DERMIS);  Surgeon: Wallace Going, DO;  Location: [facility];  Service: Plastics;  Laterality: Right;  . BREAST SURGERY     breast bx  07/2016  . CARDIAC CATHETERIZATION N/A 09/18/2015   Procedure: Left Heart Cath and Coronary Angiography;  Surgeon: Leonie Man, MD;  Location: [facility];  Service: Cardiovascular;  Laterality: N/A;  . [place]   x2  . IR CV LINE INJECTION  07/24/2016  . MASTECTOMY WITH RADIOACTIVE SEED GUIDED EXCISION AND AXILLARY SENTINEL LYMPH NODE BIOPSY Right 01/30/2017   Procedure: RIGHT SIMPLE MASTECTOMY WITH  RADIOACTIVE SEED TARGETED RIGHT AXILLARY LYMPH NODE EXCISION AND RIGHT AXILLARY SENTINEL LYMPH NODE BIOPSY;  Surgeon:  Erroll Luna, MD;  Location: [facility];  Service: General;  Laterality: Right;  . PERIPHERAL VASCULAR CATHETERIZATION N/A 08/23/2014   Procedure: Renal Angiography;  Surgeon: Adrian Prows, MD;  Location: [facility];  Service: Cardiovascular;  Laterality: N/A;  . RENAL ARTERY STENT Left 08/23/2014  . RIGHT/LEFT HEART CATH AND CORONARY ANGIOGRAPHY N/A 04/15/2019   Procedure: RIGHT/LEFT HEART CATH AND CORONARY ANGIOGRAPHY;  Surgeon: Lorretta Harp, MD;  Location: [facility];  Service: Cardiovascular;  Laterality: N/A;  . SIMPLE MASTECTOMY Right 01/30/2017  . SKIN BIOPSY  2000  . TISSUE EXPANDER PLACEMENT Right 10/08/2017   Procedure: REMOVAL OF RIGHT BREAST TISSUE EXPANDER;  Surgeon: Wallace Going, DO;  Location: [facility];  Service: Plastics;  Laterality: Right;  Case should be 45 min  . TUBAL LIGATION      Current Medications: Current Meds  Medication Sig  . acetaminophen (TYLENOL) 500 MG tablet Take 1,000 mg by mouth every 8 (eight) hours as needed for mild pain or headache.  . albuterol (PROAIR HFA) 108 (90 Base) MCG/ACT inhaler Inhale 2 puffs into the lungs every 6 (six) hours as needed for wheezing or shortness of breath.  Marland Kitchen albuterol (PROVENTIL) (2.5 MG/3ML) 0.083% nebulizer solution Take 3 mLs (2.5 mg total) by nebulization every 6 (six) hours as needed for wheezing or shortness of breath.  . anastrozole (ARIMIDEX) 1 MG tablet Take 1 tablet (1 mg total) by mouth daily.  Marland Kitchen aspirin 81 MG chewable tablet Chew 81 mg by mouth daily.  Marland Kitchen atorvastatin (LIPITOR) 80 MG tablet Take 1 tablet (80 mg total) by mouth daily at 6 PM.  . cetirizine (ZYRTEC) 10 MG tablet Take 10 mg by mouth daily as needed for allergies.   . [employer] D3 5000 units capsule Take 5,000 Units by mouth daily.  . furosemide (LASIX) 40 MG tablet Take 1 tablet (40 mg total) by mouth daily.  . metoprolol succinate (TOPROL XL) 100 MG 24 hr tablet Take 100 mg by mouth daily.  . mometasone-formoterol (DULERA) 200-5 MCG/ACT  AERO Inhale 2 puffs into the lungs 2 (two) times daily.  . nitroGLYCERIN (NITROSTAT) 0.4 MG SL tablet Place 0.4 mg under the tongue every 5 (five) minutes as needed for chest pain.  . sacubitril-valsartan (ENTRESTO) 49-51 MG Take 1 tablet by mouth 2 (two) times daily.  Marland Kitchen spironolactone (ALDACTONE) 25 MG tablet Take 1 tablet (25 mg total) by mouth daily.  Marland Kitchen venlafaxine XR (EFFEXOR-XR) 75 MG 24 hr capsule Take 1 capsule (75 mg total) by mouth daily with breakfast.     Allergies:   Compazine [prochlorperazine] and Ondansetron hcl   Social History   Socioeconomic History  . Marital status: Single    Spouse name: Not on file  . Number of children: 4  . Years of education: 9  . Highest education level: Not on file  Occupational History  . Occupation: Unemployed  Tobacco Use  . Smoking status: Current  Every Day Smoker    Packs/day: 0.50    Years: 15.00    Pack years: 7.50    Types: Cigarettes  . Smokeless tobacco: Never Used  . Tobacco comment: 6 cig per day  Substance and Sexual Activity  . Alcohol use: No    Alcohol/week: 0.0 standard drinks  . Drug use: No  . Sexual activity: Yes    Birth control/protection: None  Other Topics Concern  . Not on file  Social History Narrative   Lives at home with fiance and kids   Caffeine use: 2 cups coffee per day   4 Dr. Malachi Bonds (12oz) per day    Social Determinants of Health   Financial Resource Strain:   . Difficulty of Paying Living Expenses:   Food Insecurity:   . Worried About Charity fundraiser in the Last Year:   . Arboriculturist in the Last Year:   Transportation Needs:   . Film/video editor (Medical):   Marland Kitchen Lack of Transportation (Non-Medical):   Physical Activity:   . Days of Exercise per Week:   . Minutes of Exercise per Session:   Stress:   . Feeling of Stress :   Social Connections:   . Frequency of Communication with Friends and Family:   . Frequency of Social Gatherings with Friends and Family:   . Attends  Religious Services:   . Active Member of Clubs or Organizations:   . Attends Archivist Meetings:   Marland Kitchen Marital Status:      Family History: The patient's family history includes Asthma in her father; Breast cancer in her maternal aunt; Cervical cancer in her mother; Coronary artery disease (age of onset: 78) in her father; Hypertension in her mother; Prostate cancer in her cousin. There is no history of Migraines.  ROS:   Please see the history of present illness.     All other systems reviewed and are negative.  EKGs/Labs/Other Studies Reviewed:    The following studies were reviewed today:  Echo 04/11/2019 . Left ventricular ejection fraction, by visual estimation, is 25 to  30%. The left ventricle has severely decreased function. There is no left  ventricular hypertrophy.  2. Mild dyskinesis of the left ventricular, entire apical segment.  3. Severe hypokinesis of the left ventricular, entire anteroseptal wall  and anterior wall.  4. Severe hypokinesis of the left ventricular, entire inferolateral wall.  5. Elevated left atrial pressure.  6. Left ventricular diastolic parameters are consistent with Grade II  diastolic dysfunction (pseudonormalization).  7. The left ventricle demonstrates global hypokinesis.  8. Global right ventricle has moderately reduced systolic function.The  right ventricular size is normal. No increase in right ventricular wall  thickness.  9. Left atrial size was mildly dilated.  10. Right atrial size was normal.  11. Trivial pericardial effusion is present.  12. The mitral valve is normal in structure. Mild mitral valve  regurgitation.  13. The tricuspid valve is normal in structure.  14. The aortic valve is normal in structure. Aortic valve regurgitation is  not visualized.  15. The pulmonic valve was not well visualized. Pulmonic valve  regurgitation is not visualized.  16. Mildly elevated pulmonary artery systolic pressure.  17.  The inferior vena cava is dilated in size with <50% respiratory  variability, suggesting right atrial pressure of 15 mmHg.  18. The left ventricular function has critically worse.  19. No intracardiac thrombi or masses were visualized.    Cath 04/15/2019 IMPRESSION: Ms.  Hoselton has essentially normal coronary arteries suggesting that she has a nonischemic cardiomyopathy. Her LVEDP is low suggesting that she has been adequately diuresed. Selective angiography of her left renal artery stent shows this to be widely patent. She will need more aggressive pharmacologic therapy of her hypertension as well as her left ventricular dysfunction with guideline directed optimal medical therapy including but not limited to Entresto, carvedilol (or other beta-blocker), spironolactone. If her EF does not improve in 3 months she may require consideration for ICD implantation for primary prevention. MYNX closure devices were used to obtain hemostasis in both the artery and vein. The patient left lab in stable condition. These results have been communicated to the attending cardiologist, Dr. Tamala Julian.  EKG:  EKG is not ordered today.    Recent Labs: 04/11/2019: B Natriuretic Peptide 348.6 04/16/2019: Magnesium 2.0 07/02/2019: ALT 13; BUN 7; Creatinine, Ser 0.79; Hemoglobin 13.9; NT-Pro BNP 221; Platelets 230; Potassium 4.1; Sodium 141; TSH 1.000  Recent Lipid Panel    Component Value Date/Time   CHOL 183 09/18/2015 0340   TRIG 380 (H) 04/11/2019 0746   HDL 47 09/18/2015 0340   CHOLHDL 3.9 09/18/2015 0340   VLDL 25 09/18/2015 0340   LDLCALC 111 (H) 09/18/2015 0340    Physical Exam:    VS:  BP 138/88   Pulse 91   Temp (!) 97.5 F (36.4 C)   Ht '5\' 3"'  (1.6 m)   Wt 173 lb 12.8 oz (78.8 kg)   SpO2 92%   BMI 30.79 kg/m     Wt Readings from Last 3 Encounters:  07/02/19 173 lb 12.8 oz (78.8 kg)  04/22/19 172 lb 8 oz (78.2 kg)  04/16/19 165 lb 12.8 oz (75.2 kg)     GEN:  Well nourished, well developed in no  acute distress HEENT: Normal NECK: No JVD; No carotid bruits LYMPHATICS: No lymphadenopathy CARDIAC: RRR, no murmurs, rubs, gallops RESPIRATORY:  Clear to auscultation without rales, wheezing or rhonchi  ABDOMEN: Soft, non-tender, non-distended MUSCULOSKELETAL:  No edema; No deformity  SKIN: Warm and dry NEUROLOGIC:  Alert and oriented x 3 PSYCHIATRIC:  Normal affect   ASSESSMENT:    1. NICM (nonischemic cardiomyopathy) ([place])   2. Coronary artery disease involving native coronary artery of native heart without angina pectoris   3. Hyperlipidemia with target LDL less than 70   4. Renal artery stenosis ([facility])    PLAN:    In order of problems listed above:  1. Nonischemic cardiomyopathy: Patient is on Toprol-XL and spironolactone.  Unfortunately she has not taken her Entresto for the past 1.5 months.  We will proceed with prior authorization for her Entresto.  2. CAD: Previous cardiac catheterization in 2017 showed 60% mid to distal LAD, however recent cardiac catheterization showed essentially normal coronary arteries.  3. Hyperlipidemia: On Lipitor 80 mg daily  4. Renal artery stenosis: Underwent left renal artery stenting.   Medication Adjustments/Labs and Tests Ordered: Current medicines are reviewed at length with the patient today.  Concerns regarding medicines are outlined above.  No orders of the defined types were placed in this encounter.  No orders of the defined types were placed in this encounter.   Patient Instructions  Medication Instructions:  Your physician recommends that you continue on your current medications as directed. Please refer to the Current Medication list given to you today.  *If you need a refill on your cardiac medications before your next appointment, please call your pharmacy*  Lab Work: You will need to  have labs drawn (blood work) today that was ordered from a previous office visit.   CBC  CMET  Pro BNP  TSH If you have labs  (blood work) drawn today and your tests are completely normal, you will receive your results only by: Marland Kitchen MyChart Message (if you have MyChart) OR . A paper copy in the mail If you have any lab test that is abnormal or we need to change your treatment, we will call you to review the results.  Testing/Procedures: NONE ordered at this time of appointment   Follow-Up: At [facility], you and your health needs are our priority.  As part of our continuing mission to provide you with exceptional heart care, we have created designated Provider Care Teams.  These Care Teams include your primary Cardiologist (physician) and Advanced Practice Providers (APPs -  Physician Assistants and Nurse Practitioners) who all work together to provide you with the care you need, when you need it.  Your next appointment:   3 week(s)  The format for your next appointment:   In Person  Provider:   Almyra Deforest, PA-C  Other Instructions Medication Samples have been provided to the patient.  Drug name: Delene Loll       Strength: 83m-51mg        Qty: 28 tablets  LOT: [id number] Exp.Date: 12/2020  Dosing instructions: Take 1 tablet 2 times a daily  The patient has been instructed regarding the correct time, dose, and frequency of taking this medication, including desired effects and most common side effects.   TJacqulynn Cadet11:47 AM  07/02/2019      Signed, HAlmyra Deforest PA  07/04/2019 11:19 PM    [facility]

## 2019-07-03 LAB — CBC
Hematocrit: 40.6 % (ref 34.0–46.6)
Hemoglobin: 13.9 g/dL (ref 11.1–15.9)
MCH: 30.7 pg (ref 26.6–33.0)
MCHC: 34.2 g/dL (ref 31.5–35.7)
MCV: 90 fL (ref 79–97)
Platelets: 230 10*3/uL (ref 150–450)
RBC: 4.53 x10E6/uL (ref 3.77–5.28)
RDW: 14.4 % (ref 11.7–15.4)
WBC: 8.8 10*3/uL (ref 3.4–10.8)

## 2019-07-03 LAB — COMPREHENSIVE METABOLIC PANEL
ALT: 13 IU/L (ref 0–32)
AST: 18 IU/L (ref 0–40)
Albumin/Globulin Ratio: 2.3 — ABNORMAL HIGH (ref 1.2–2.2)
Albumin: 4.6 g/dL (ref 3.8–4.8)
Alkaline Phosphatase: 212 IU/L — ABNORMAL HIGH (ref 39–117)
BUN/Creatinine Ratio: 9 (ref 9–23)
BUN: 7 mg/dL (ref 6–24)
Bilirubin Total: 0.2 mg/dL (ref 0.0–1.2)
CO2: 21 mmol/L (ref 20–29)
Calcium: 10.7 mg/dL — ABNORMAL HIGH (ref 8.7–10.2)
Chloride: 106 mmol/L (ref 96–106)
Creatinine, Ser: 0.79 mg/dL (ref 0.57–1.00)
GFR calc Af Amer: 103 mL/min/{1.73_m2} (ref >59)
GFR calc non Af Amer: 89 mL/min/{1.73_m2} (ref >59)
Globulin, Total: 2 g/dL (ref 1.5–4.5)
Glucose: 91 mg/dL (ref 65–99)
Potassium: 4.1 mmol/L (ref 3.5–5.2)
Sodium: 141 mmol/L (ref 134–144)
Total Protein: 6.6 g/dL (ref 6.0–8.5)

## 2019-07-03 LAB — TSH: TSH: 1 u[IU]/mL (ref 0.450–4.500)

## 2019-07-03 LAB — PRO B NATRIURETIC PEPTIDE: NT-Pro BNP: 221 pg/mL (ref 0–249)

## 2019-07-04 ENCOUNTER — Encounter: Payer: Self-pay | Admitting: Physician Assistant

## 2019-07-05 ENCOUNTER — Telehealth: Payer: Self-pay | Admitting: Physician Assistant

## 2019-07-05 NOTE — Telephone Encounter (Signed)
I have called [place] to get PA on Entresto, info received by [place] [facility], clinical pharmacist will make a decision in 24 hours. Will check  track website tomorrow or call them  PA # [id number] Phone call reference number:  [phone]

## 2019-07-06 NOTE — Telephone Encounter (Signed)
Called [place] [facility], PA for entresto has been approved for all doses Delene Loll PA is not dose specific, therefore no new PA needed later if need to uptitrate the dose). I left a message on the patient's cell phone.   Will ask my staff to follow up to make sure she got the message.

## 2019-07-08 NOTE — Telephone Encounter (Signed)
Called the patient to verify if she received the message from Almyra Deforest, PA-C about her PA for her Entresto medication. Patient stated that she received the message and picked up the medication yesterday 06/06/2019.

## 2019-07-23 ENCOUNTER — Encounter: Payer: Medicaid Other | Admitting: Physician Assistant

## 2019-07-23 NOTE — Progress Notes (Signed)
This encounter was created in error - please disregard.

## 2019-08-18 ENCOUNTER — Other Ambulatory Visit: Payer: Self-pay

## 2019-08-18 ENCOUNTER — Other Ambulatory Visit: Payer: Self-pay | Admitting: Cardiovascular Disease

## 2019-08-18 MED ORDER — SPIRONOLACTONE 25 MG PO TABS: 25.0000 mg | ORAL_TABLET | Freq: Every day | ORAL | 3 refills | Status: DC

## 2019-10-24 ENCOUNTER — Other Ambulatory Visit: Payer: Self-pay | Admitting: Hematology and Oncology

## 2019-12-23 ENCOUNTER — Telehealth: Payer: Self-pay | Admitting: Hematology and Oncology

## 2019-12-23 ENCOUNTER — Other Ambulatory Visit: Payer: Self-pay | Admitting: Hematology and Oncology

## 2019-12-23 NOTE — Telephone Encounter (Signed)
Scheduled appt per 9/30 sch msg - pt aware of appt.

## 2020-01-12 NOTE — Assessment & Plan Note (Deleted)
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  Treatment summary: 1. Neoadjuvant chemotherapy with Taxotereand Cytoxan every 3 weeks4completed July 2018 2.01/30/2017:Right mastectomy: IDC grade 3, 7.2 cm, high-grade DCIS, lymphovascular invasion present including dermal lymphatics, perineural invasion present, margins negative, 4/5 lymph nodes positive with extracapsular extension, ER 100%, PR 100%, HER-2 negative ratio 1.41 3.Adjuvant radiation therapy3/09/2017-07/18/2017 5.Followed by antiestrogen therapy with tamoxifen --------------------------------------------------------------------- Current treatment:Anastrozole 1 mg daily to start 04/13/2018, patient had not had menstrual cycle since 2010  Anastrozole toxicities: 1.  Hot flashes: On Effexor 75 mg a day: Currently hot flashes are fully under control Occasional muscle aches and pains  Breast cancer surveillance: 1.Breast exam 01/13/2020: Benign 2.Mammogram: 05/13/2018: No evidence of malignancy, breast density category B She needs another mammogram this year.  Return to clinic in 1 year for follow-up

## 2020-01-13 ENCOUNTER — Inpatient Hospital Stay: Payer: Medicaid Other | Attending: Hematology and Oncology | Admitting: Hematology and Oncology

## 2020-01-13 DIAGNOSIS — C50811 Malignant neoplasm of overlapping sites of right female breast: Secondary | ICD-10-CM

## 2020-03-23 ENCOUNTER — Other Ambulatory Visit: Payer: Self-pay | Admitting: Hematology and Oncology

## 2020-04-21 ENCOUNTER — Other Ambulatory Visit: Payer: Self-pay | Admitting: Cardiovascular Disease

## 2020-04-21 DIAGNOSIS — Z79899 Other long term (current) drug therapy: Secondary | ICD-10-CM

## 2020-04-21 DIAGNOSIS — I150 Renovascular hypertension: Secondary | ICD-10-CM

## 2020-04-21 DIAGNOSIS — I10 Essential (primary) hypertension: Secondary | ICD-10-CM

## 2020-08-14 NOTE — Progress Notes (Signed)
Cardiology Office Note:    Date:  08/15/2020   ID:  [name], [name] Oct 02, 1970, MRN [id number]  PCP:  Kimberly Docker, MD Referring MD: Kimberly Docker, MD    [place]  Cardiologist:  Kimberly Majestic, MD   Reason for visit: F/U CHF  History of Present Illness:    [name] is a 50 y.o. female with a hx of CAD, HLD, renal artery hypertension s/p left renal artery stenting, right-sided Bell's palsy 2001, lymphoma of the neck, and history of breast cancer.  Patient was admitted in January 2021 with acute respiratory failure.  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  She was treated for hypertensive emergency.  Echocardiogram showed a drop in EF to 25 to 30%.  She underwent aggressive diuresis and heart failure medication titration.  She subsequently underwent L/RHC that showed patent coronary arteries, PA systolic pressure 33 mmHg.    She was last seen by [name] on 07/02/19 and was noted to not be taking Entresto.  Prior approval completed.  She was supposed to follow-up in 3 weeks but has not been seen in 1 year.  Today, she comes alone to clinic. States she has been out of her cardiac medications, particularly Entresto and lipitor.  She is applying for disability.  She mentions she has sharp pain in left breast when laying down ~2x/wk, improved with tylenol.  Sometimes, she feels this pain after walking "too much."  She uses 3 pillows at night which helps her "breathe better."  When asked how she feels when laying flat, she states "I would have that L breast pain after 30 minutes."  Denies "drowning" feeling.  She denies PND/orthopnea and LE edema.  She has +bloating, early satiety and weight gain of 10 lbs since 03/2019.  She has SOB with housework, ex. Vacuuming, though states her SOB is not as bad as last year.  She is on inhalers for her emphysema.  She has grown kids and grandchildren and  helps with babysitting.   Past Medical History:  Diagnosis Date  . Asthma    beginning of 2018 - came to ER    . Bell's palsy   . Breast cancer ([place]) 01/2017  . Cardiomegaly   . Coronary artery disease    NSTEMI 08/2015 (65% LAD; D1 50%)  . Family history of breast cancer   . Family history of prostate cancer   . 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 GERD (gastroesophageal reflux disease)   . Hypertension   . 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 Myocardial infarction ([place]) 2017  . Renal artery stenosis ([facility])    s/p stent placment to left   . Respiratory failure ([place]) 03/2019    Past Surgical History:  Procedure Laterality Date  . AXILLARY LYMPH NODE DISSECTION Right 03/13/2017   Procedure: AXILLARY LYMPH NODE DISSECTION;  Surgeon: Kimberly Luna, MD;  Location: [facility];  Service: General;  Laterality: Right;  . BREAST RECONSTRUCTION WITH PLACEMENT OF TISSUE EXPANDER AND FLEX HD (ACELLULAR HYDRATED DERMIS) Right 01/30/2017   Procedure: RIGHT BREAST RECONSTRUCTION WITH PLACEMENT OF TISSUE EXPANDER AND FLEX HD (ACELLULAR HYDRATED DERMIS);  Surgeon: Kimberly Going, DO;  Location: [facility];  Service: Plastics;  Laterality: Right;  . BREAST SURGERY     breast bx  07/2016  . CARDIAC CATHETERIZATION N/A 09/18/2015   Procedure: Left Heart  Cath and Coronary Angiography;  Surgeon: Leonie Man, MD;  Location: [facility];  Service: Cardiovascular;  Laterality: N/A;  . [place]   x2  . IR CV LINE INJECTION  07/24/2016  . MASTECTOMY WITH RADIOACTIVE SEED GUIDED EXCISION AND AXILLARY SENTINEL LYMPH NODE BIOPSY Right 01/30/2017   Procedure: RIGHT SIMPLE MASTECTOMY WITH  RADIOACTIVE SEED TARGETED RIGHT AXILLARY LYMPH NODE EXCISION AND RIGHT AXILLARY SENTINEL LYMPH NODE BIOPSY;  Surgeon: Kimberly Luna, MD;  Location: [facility];  Service: General;  Laterality: Right;  . PERIPHERAL VASCULAR CATHETERIZATION N/A 08/23/2014   Procedure: Renal Angiography;   Surgeon: Kimberly Prows, MD;  Location: [facility];  Service: Cardiovascular;  Laterality: N/A;  . RENAL ARTERY STENT Left 08/23/2014  . RIGHT/LEFT HEART CATH AND CORONARY ANGIOGRAPHY N/A 04/15/2019   Procedure: RIGHT/LEFT HEART CATH AND CORONARY ANGIOGRAPHY;  Surgeon: Kimberly Harp, MD;  Location: [facility];  Service: Cardiovascular;  Laterality: N/A;  . SIMPLE MASTECTOMY Right 01/30/2017  . SKIN BIOPSY  2000  . TISSUE EXPANDER PLACEMENT Right 10/08/2017   Procedure: REMOVAL OF RIGHT BREAST TISSUE EXPANDER;  Surgeon: Kimberly Going, DO;  Location: [facility];  Service: Plastics;  Laterality: Right;  Case should be 45 min  . TUBAL LIGATION      Current Medications: Current Meds  Medication Sig  . acetaminophen (TYLENOL) 500 MG tablet Take 1,000 mg by mouth every 8 (eight) hours as needed for mild pain or headache.  . albuterol (PROVENTIL) (2.5 MG/3ML) 0.083% nebulizer solution Take 3 mLs (2.5 mg total) by nebulization every 6 (six) hours as needed for wheezing or shortness of breath.  Marland Kitchen albuterol (VENTOLIN HFA) 108 (90 Base) MCG/ACT inhaler Inhale 2 puffs into the lungs every 6 (six) hours as needed for wheezing or shortness of breath.  . anastrozole (ARIMIDEX) 1 MG tablet TAKE 1 TABLET BY MOUTH EVERY DAY  . aspirin 81 MG chewable tablet Chew 81 mg by mouth daily.  . cetirizine (ZYRTEC) 10 MG tablet Take 10 mg by mouth daily as needed for allergies.   . [facility] D3 5000 units capsule Take 5,000 Units by mouth daily.  . mometasone-formoterol (DULERA) 200-5 MCG/ACT AERO Inhale 2 puffs into the lungs 2 (two) times daily.  Marland Kitchen venlafaxine XR (EFFEXOR-XR) 75 MG 24 hr capsule TAKE 1 CAPSULE (75 MG TOTAL) BY MOUTH DAILY WITH BREAKFAST.  . [DISCONTINUED] atorvastatin (LIPITOR) 80 MG tablet Take 1 tablet (80 mg total) by mouth daily at 6 PM.  . [DISCONTINUED] furosemide (LASIX) 40 MG tablet TAKE 1 TABLET BY MOUTH EVERY DAY  . [DISCONTINUED] metoprolol succinate (TOPROL-XL) 100 MG 24 hr tablet  Take 100 mg by mouth daily.  . [DISCONTINUED] nitroGLYCERIN (NITROSTAT) 0.4 MG SL tablet Place 0.4 mg under the tongue every 5 (five) minutes as needed for chest pain.  . [DISCONTINUED] sacubitril-valsartan (ENTRESTO) 49-51 MG Take 1 tablet by mouth 2 (two) times daily.  . [DISCONTINUED] spironolactone (ALDACTONE) 25 MG tablet Take 1 tablet (25 mg total) by mouth daily.     Allergies:   Compazine [prochlorperazine] and Ondansetron hcl   Social History   Socioeconomic History  . Marital status: Single    Spouse name: Not on file  . Number of children: 4  . Years of education: 9  . Highest education level: Not on file  Occupational History  . Occupation: Unemployed  Tobacco Use  . Smoking status: Current Every Day Smoker    Packs/day: 0.50  Years: 15.00    Pack years: 7.50    Types: Cigarettes  . Smokeless tobacco: Never Used  . Tobacco comment: 6 cig per day  Vaping Use  . Vaping Use: Never used  Substance and Sexual Activity  . Alcohol use: No    Alcohol/week: 0.0 standard drinks  . Drug use: No  . Sexual activity: Yes    Birth control/protection: None  Other Topics Concern  . Not on file  Social History Narrative   Lives at home with fiance and kids   Caffeine use: 2 cups coffee per day   4 Dr. Malachi Bonds (12oz) per day    Social Determinants of Health   Financial Resource Strain: Not on file  Food Insecurity: Not on file  Transportation Needs: Not on file  Physical Activity: Not on file  Stress: Not on file  Social Connections: Not on file     Family History: The patient's family history includes Asthma in her father; Breast cancer in her maternal aunt; Cervical cancer in her mother; Coronary artery disease (age of onset: 42) in her father; Hypertension in her mother; Prostate cancer in her cousin. There is no history of Migraines.  ROS:   Please see the history of present illness.     EKGs/Labs/Other Studies Reviewed:    EKG:  The ekg ordered today  demonstrates NSR, rate 80, evidence of anterior infarct (unchanged from prior EKG)  Recent Labs: No results found for requested labs within last 8760 hours.  Recent Lipid Panel    Component Value Date/Time   CHOL 183 09/18/2015 0340   TRIG 380 (H) 04/11/2019 0746   HDL 47 09/18/2015 0340   CHOLHDL 3.9 09/18/2015 0340   VLDL 25 09/18/2015 0340   LDLCALC 111 (H) 09/18/2015 0340    Physical Exam:    VS:  BP (!) 146/96 (BP Location: Left Arm, Patient Position: Sitting, Cuff Size: Normal)   Pulse 78   Ht '5\' 3"'  (1.6 m)   Wt 178 lb 6.4 oz (80.9 kg)   BMI 31.60 kg/m     Wt Readings from Last 3 Encounters:  08/15/20 178 lb 6.4 oz (80.9 kg)  07/02/19 173 lb 12.8 oz (78.8 kg)  04/22/19 172 lb 8 oz (78.2 kg)     GEN:  Well nourished, well developed in no acute distress HEENT: Normal NECK: JVD at low neck at 60 degrees; No carotid bruits CARDIAC: RRR, no murmurs, rubs, gallops RESPIRATORY:  Clear to auscultation without rales, wheezing or rhonchi  ABDOMEN: Soft, non-tender MUSCULOSKELETAL: No edema; No deformity  SKIN: Warm and dry NEUROLOGIC:  Alert and oriented PSYCHIATRIC:  Normal affect   ASSESSMENT AND PLAN   Nonischemic cardiomyopathy, minimally hypervolemic - 2D echo 04/11/2019: EF 25-30% with WMAs, grade II DD  - No signs of cardiogenic shock at this time. - Refill Toprol 160m daily, Entresto 49/533mBID, spiro 258maily, lasix 60m43mily.   - Check CMET & BNP in 2 weeks with follow-up appt.  If bloodwork/BP stable, consider increasing Entresto. -  After 3 months of optimal therapy (needs to demonstrate compliance), would consider repeat echo & possible ICD for primary prevention if EF remains <35%. - Monitor weight at home, discussed CHF symptoms and when to call the office/go to the hospital.   - Advised to bring disability paperwork to her PCP.  HTN, BP elevated today - pt states BP better controlled at home - cont meds as above & will f/u BP at next  visit.  Hyperlipidemia -  check fasting lipids at next appt.  LDL 111 08/2015, Trig 380 03/2019.   - refill Lipitor 26m  Renal artery stenosis s/p L RAS - left renal artery stent patent on LHC 03/2019 - c/w BP & lipid management.  Disposition: F/U with me in 2 weeks.  Optimize CHF med therapy, while monitoring labs.  F/U 2D echo after 3 months of optimal therapy.  Medication Adjustments/Labs and Tests Ordered: Current medicines are reviewed at length with the patient today.  Concerns regarding medicines are outlined above.  Orders Placed This Encounter  Procedures  . Comprehensive metabolic panel  . Brain natriuretic peptide  . Lipid Profile  . EKG 12-Lead   Meds ordered this encounter  Medications  . atorvastatin (LIPITOR) 80 MG tablet    Sig: Take 1 tablet (80 mg total) by mouth daily at 6 PM.    Dispense:  30 tablet    Refill:  0  . metoprolol succinate (TOPROL-XL) 100 MG 24 hr tablet    Sig: Take 1 tablet (100 mg total) by mouth daily.    Dispense:  30 tablet    Refill:  3  . furosemide (LASIX) 40 MG tablet    Sig: Take 1 tablet (40 mg total) by mouth daily.    Dispense:  30 tablet    Refill:  3  . nitroGLYCERIN (NITROSTAT) 0.4 MG SL tablet    Sig: Place 1 tablet (0.4 mg total) under the tongue every 5 (five) minutes as needed for chest pain.    Dispense:  25 tablet    Refill:  3  . sacubitril-valsartan (ENTRESTO) 49-51 MG    Sig: Take 1 tablet by mouth 2 (two) times daily.    Dispense:  60 tablet    Refill:  3  . spironolactone (ALDACTONE) 25 MG tablet    Sig: Take 1 tablet (25 mg total) by mouth daily.    Dispense:  30 tablet    Refill:  3    Patient Instructions  Medication Instructions:  The current medical regimen is effective;  continue present plan and medications as directed. Please refer to the Current Medication list given to you today.  *If you need a refill on your cardiac medications before your next appointment, please call your pharmacy*  Lab  Work: FASTING LIPID, CMET, BNP BEFORE YOUR FOLLOW UP APPOINTMENT If you have labs (blood work) drawn today and your tests are completely normal, you will receive your results only by:  [facility](if you have MyChart) OR A paper copy in the mail.  If you have any lab test that is abnormal or we need to change your treatment, we will call you to review the results. You may go to any [facility] that is convenient for you however, we do have a lab in our office that is able to assist you. You DO NOT need an appointment for our lab. The lab is open 8:00am and closes at 4:00pm. Lunch 12:45 - 1:45pm.  Follow-Up: Your next appointment:  June 7th @  945AM In Person with JCaron Presume PA-C  [facility] you and your health needs are our priority.  As part of our continuing mission to provide you with exceptional heart care, we have created designated Provider Care Teams.  These Care Teams include your primary Cardiologist (physician) and Advanced Practice Providers (APPs -  Physician Assistants and Nurse Practitioners) who all work together to provide you with the care you need, when you need it.  Signed, Warren Lacy, PA-C  08/15/2020 11:04 AM    [facility]

## 2020-08-15 ENCOUNTER — Encounter: Payer: Self-pay | Admitting: Physician Assistant

## 2020-08-15 ENCOUNTER — Other Ambulatory Visit: Payer: Self-pay

## 2020-08-15 ENCOUNTER — Ambulatory Visit (INDEPENDENT_AMBULATORY_CARE_PROVIDER_SITE_OTHER): Payer: Medicaid Other | Admitting: Physician Assistant

## 2020-08-15 VITALS — BP 146/96 | HR 78 | Ht 63.0 in | Wt 178.4 lb

## 2020-08-15 DIAGNOSIS — E785 Hyperlipidemia, unspecified: Secondary | ICD-10-CM | POA: Diagnosis not present

## 2020-08-15 DIAGNOSIS — I701 Atherosclerosis of renal artery: Secondary | ICD-10-CM

## 2020-08-15 DIAGNOSIS — I428 Other cardiomyopathies: Secondary | ICD-10-CM | POA: Diagnosis not present

## 2020-08-15 DIAGNOSIS — I150 Renovascular hypertension: Secondary | ICD-10-CM | POA: Diagnosis not present

## 2020-08-15 DIAGNOSIS — I10 Essential (primary) hypertension: Secondary | ICD-10-CM

## 2020-08-15 DIAGNOSIS — Z79899 Other long term (current) drug therapy: Secondary | ICD-10-CM

## 2020-08-15 MED ORDER — NITROGLYCERIN 0.4 MG SL SUBL: 0.4000 mg | SUBLINGUAL_TABLET | SUBLINGUAL | 3 refills | Status: AC | PRN

## 2020-08-15 MED ORDER — SACUBITRIL-VALSARTAN 49-51 MG PO TABS: 1.0000 | ORAL_TABLET | Freq: Two times a day (BID) | ORAL | 3 refills | Status: DC

## 2020-08-15 MED ORDER — FUROSEMIDE 40 MG PO TABS: 40.0000 mg | ORAL_TABLET | Freq: Every day | ORAL | 3 refills | Status: DC

## 2020-08-15 MED ORDER — ATORVASTATIN CALCIUM 80 MG PO TABS: 80.0000 mg | ORAL_TABLET | Freq: Every day | ORAL | 0 refills | Status: DC

## 2020-08-15 MED ORDER — SPIRONOLACTONE 25 MG PO TABS: 25.0000 mg | ORAL_TABLET | Freq: Every day | ORAL | 3 refills | Status: DC

## 2020-08-15 MED ORDER — METOPROLOL SUCCINATE ER 100 MG PO TB24: 100.0000 mg | ORAL_TABLET | Freq: Every day | ORAL | 3 refills | Status: DC

## 2020-08-15 NOTE — Patient Instructions (Signed)
Medication Instructions:  The current medical regimen is effective;  continue present plan and medications as directed. Please refer to the Current Medication list given to you today.  *If you need a refill on your cardiac medications before your next appointment, please call your pharmacy*  Lab Work: FASTING LIPID, CMET, BNP BEFORE YOUR FOLLOW UP APPOINTMENT If you have labs (blood work) drawn today and your tests are completely normal, you will receive your results only by:  [facility] (if you have MyChart) OR A paper copy in the mail.  If you have any lab test that is abnormal or we need to change your treatment, we will call you to review the results. You may go to any [facility] that is convenient for you however, we do have a lab in our office that is able to assist you. You DO NOT need an appointment for our lab. The lab is open 8:00am and closes at 4:00pm. Lunch 12:45 - 1:45pm.  Follow-Up: Your next appointment:  June 7th @  945AM In Person with Caron Presume, PA-C  At [facility], you and your health needs are our priority.  As part of our continuing mission to provide you with exceptional heart care, we have created designated Provider Care Teams.  These Care Teams include your primary Cardiologist (physician) and Advanced Practice Providers (APPs -  Physician Assistants and Nurse Practitioners) who all work together to provide you with the care you need, when you need it.

## 2020-08-28 LAB — COMPREHENSIVE METABOLIC PANEL
ALT: 29 IU/L (ref 0–32)
AST: 21 IU/L (ref 0–40)
Albumin/Globulin Ratio: 2.3 — ABNORMAL HIGH (ref 1.2–2.2)
Albumin: 4.5 g/dL (ref 3.8–4.8)
Alkaline Phosphatase: 220 IU/L — ABNORMAL HIGH (ref 44–121)
BUN/Creatinine Ratio: 10 (ref 9–23)
BUN: 9 mg/dL (ref 6–24)
Bilirubin Total: 0.4 mg/dL (ref 0.0–1.2)
CO2: 19 mmol/L — ABNORMAL LOW (ref 20–29)
Calcium: 10.6 mg/dL — ABNORMAL HIGH (ref 8.7–10.2)
Chloride: 105 mmol/L (ref 96–106)
Creatinine, Ser: 0.86 mg/dL (ref 0.57–1.00)
Globulin, Total: 2 g/dL (ref 1.5–4.5)
Glucose: 91 mg/dL (ref 65–99)
Potassium: 4.3 mmol/L (ref 3.5–5.2)
Sodium: 139 mmol/L (ref 134–144)
Total Protein: 6.5 g/dL (ref 6.0–8.5)
eGFR: 83 mL/min/{1.73_m2} (ref >59)

## 2020-08-28 LAB — LIPID PANEL
Chol/HDL Ratio: 2.9 ratio (ref 0.0–4.4)
Cholesterol, Total: 113 mg/dL (ref 100–199)
HDL: 39 mg/dL — ABNORMAL LOW (ref >39)
LDL Chol Calc (NIH): 54 mg/dL (ref 0–99)
Triglycerides: 106 mg/dL (ref 0–149)
VLDL Cholesterol Cal: 20 mg/dL (ref 5–40)

## 2020-08-28 LAB — BRAIN NATRIURETIC PEPTIDE: BNP: 28.8 pg/mL (ref 0.0–100.0)

## 2020-08-29 ENCOUNTER — Ambulatory Visit: Payer: Medicaid Other | Admitting: General Practice

## 2020-09-05 ENCOUNTER — Ambulatory Visit (INDEPENDENT_AMBULATORY_CARE_PROVIDER_SITE_OTHER): Payer: Medicaid Other | Admitting: Physician Assistant

## 2020-09-05 ENCOUNTER — Other Ambulatory Visit: Payer: Self-pay

## 2020-09-05 ENCOUNTER — Encounter: Payer: Self-pay | Admitting: Physician Assistant

## 2020-09-05 ENCOUNTER — Telehealth: Payer: Self-pay

## 2020-09-05 ENCOUNTER — Ambulatory Visit: Payer: Medicaid Other | Admitting: Physician Assistant

## 2020-09-05 VITALS — BP 138/72 | HR 83 | Ht 63.0 in | Wt 182.2 lb

## 2020-09-05 DIAGNOSIS — E785 Hyperlipidemia, unspecified: Secondary | ICD-10-CM

## 2020-09-05 DIAGNOSIS — R0689 Other abnormalities of breathing: Secondary | ICD-10-CM

## 2020-09-05 DIAGNOSIS — I2510 Atherosclerotic heart disease of native coronary artery without angina pectoris: Secondary | ICD-10-CM

## 2020-09-05 DIAGNOSIS — I428 Other cardiomyopathies: Secondary | ICD-10-CM | POA: Diagnosis not present

## 2020-09-05 NOTE — Telephone Encounter (Signed)
Called patient to informed her that an order was placed for her to have a chest xray as previously discussed at today's visit. Also informed patient that her prior authorization for her Delene Loll was turned in and will have a 24-48 hour turn around time with an answer.

## 2020-09-05 NOTE — Progress Notes (Signed)
Cardiology Office Note:    Date:  09/07/2020   ID:  [name], DOB November 24, 1970, MRN [id number]  PCP:  Javier Docker, MD   [facility] Providers Cardiologist:  Shelva Majestic, MD {   Referring MD: Javier Docker, MD   Chief Complaint  Patient presents with   Follow-up    Seen for Dr. Claiborne Billings    History of Present Illness:    [name] is a 50 y.o. female with a hx of CAD, HLD, renal artery hypertension s/p left renal artery stenting, right-sided Bell's palsy 2001, lymphoma of the neck, and history of breast cancer.  He had a NSTEMI in June 2017.  Cardiac catheterization the time showed 65% stenosis in tortuous segment, medical therapy was recommended.  Myoview obtained on 12/30/2016 showed EF 43%, medium defect of severe severity present in the apical anterior, apical inferior and apex location, overall intermediate risk study.  Patient was admitted in January 2021 with acute respiratory failure.  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  Covid test was negative.  She was treated for hypertensive emergency.  Echocardiogram showed a drop in ejection fraction to 25 to 30%.  She underwent aggressive diuresis and heart failure medication titration.  She subsequently underwent left and right heart cath that showed patent coronary arteries, PA systolic pressure 33 mmHg.  She was last seen by Dr. Claiborne Billings on 04/14/2019 who felt she continued to have breathing issue and increased Lasix to 40 mg daily.  I last saw the patient in April 2021 and did a prior authorization approval for her Delene Loll.  More recently she was seen by Fabian Sharp on 08/15/2020, unfortunately she has been out of her medication again particularly Entresto and Lipitor.  Patient present today for follow-up.  She denies any chest pain or worsening dyspnea.  Even though a new prescription of Entresto was sent to her pharmacy, she still can pick it up due to  lack of prior authorization.  We will try to proceed with prior authorization today.  On exam, she also had decreased breath sounds in right upper lobe of the lung.  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   Myocardial infarction [facility]) 2017   Renal artery stenosis ([facility])    s/p stent placment to left    Respiratory failure ([place]) 03/2019    Past Surgical History:  Procedure Laterality Date   AXILLARY LYMPH NODE DISSECTION Right 03/13/2017   Procedure: AXILLARY LYMPH NODE DISSECTION;  Surgeon: Erroll Luna, MD;  Location: [place];  Service: General;  Laterality: Right;   BREAST RECONSTRUCTION WITH PLACEMENT OF TISSUE EXPANDER AND FLEX HD (ACELLULAR HYDRATED DERMIS) Right 01/30/2017   Procedure: RIGHT BREAST RECONSTRUCTION WITH PLACEMENT OF TISSUE EXPANDER AND FLEX HD (ACELLULAR HYDRATED DERMIS);  Surgeon: Wallace Going, DO;  Location: [place];  Service: Plastics;  Laterality: Right;  BREAST SURGERY     breast bx  07/2016   CARDIAC CATHETERIZATION N/A 09/18/2015   Procedure: Left Heart Cath and Coronary Angiography;  Surgeon: Leonie Man, MD;  Location: [place] [facility];  Service: Cardiovascular;  Laterality: N/A;   [place]   x2   IR CV LINE INJECTION  07/24/2016   MASTECTOMY WITH RADIOACTIVE SEED GUIDED EXCISION AND AXILLARY SENTINEL LYMPH NODE BIOPSY Right 01/30/2017   Procedure: RIGHT SIMPLE MASTECTOMY WITH  RADIOACTIVE SEED TARGETED RIGHT AXILLARY LYMPH NODE EXCISION AND RIGHT AXILLARY SENTINEL LYMPH NODE BIOPSY;  Surgeon: Erroll Luna,  MD;  Location: [place];  Service: General;  Laterality: Right;   PERIPHERAL VASCULAR CATHETERIZATION N/A 08/23/2014   Procedure: Renal Angiography;  Surgeon: Adrian Prows, MD;  Location: [place] [facility];  Service: Cardiovascular;  Laterality: N/A;   RENAL ARTERY STENT Left 08/23/2014   RIGHT/LEFT HEART CATH AND CORONARY ANGIOGRAPHY N/A 04/15/2019   Procedure: RIGHT/LEFT HEART CATH AND CORONARY ANGIOGRAPHY;  Surgeon: Lorretta Harp, MD;  Location: [place] [facility];  Service: Cardiovascular;  Laterality: N/A;   SIMPLE MASTECTOMY Right 01/30/2017   SKIN BIOPSY  2000   TISSUE EXPANDER PLACEMENT Right 10/08/2017   Procedure: REMOVAL OF RIGHT BREAST TISSUE EXPANDER;  Surgeon: Wallace Going, DO;  Location: [place];  Service: Plastics;  Laterality: Right;  Case should be 45 min   TUBAL LIGATION      Current Medications: Current Meds  Medication Sig   acetaminophen (TYLENOL) 500 MG tablet Take 1,000 mg by mouth every 8 (eight) hours as needed for mild pain or headache.   albuterol (PROVENTIL) (2.5 MG/3ML) 0.083% nebulizer solution Take 3 mLs (2.5 mg total) by nebulization every 6 (six) hours as needed for wheezing or shortness of breath.   albuterol (VENTOLIN HFA) 108 (90 Base) MCG/ACT inhaler Inhale 2 puffs into the lungs every 6 (six) hours as needed for wheezing or shortness of breath.   anastrozole (ARIMIDEX) 1 MG tablet TAKE 1 TABLET BY MOUTH EVERY DAY   aspirin 81 MG chewable tablet Chew 81 mg by mouth daily.   atorvastatin (LIPITOR) 80 MG tablet Take 1 tablet (80 mg total) by mouth daily at 6 PM.   cetirizine (ZYRTEC) 10 MG tablet Take 10 mg by mouth daily as needed for allergies.    [employer] D3 5000 units capsule Take 5,000 Units by mouth daily.   furosemide (LASIX) 40 MG tablet Take 1 tablet (40 mg total) by mouth daily.   metoprolol succinate (TOPROL-XL) 100 MG 24 hr tablet Take 1 tablet (100 mg total) by mouth daily.   mometasone-formoterol (DULERA) 200-5 MCG/ACT AERO Inhale 2 puffs into  the lungs 2 (two) times daily.   nitroGLYCERIN (NITROSTAT) 0.4 MG SL tablet Place 1 tablet (0.4 mg total) under the tongue every 5 (five) minutes as needed for chest pain.   sacubitril-valsartan (ENTRESTO) 49-51 MG Take 1 tablet by mouth 2 (two) times daily.   spironolactone (ALDACTONE) 25 MG tablet Take 1 tablet (25 mg total) by mouth daily.   venlafaxine XR (EFFEXOR-XR) 75 MG 24 hr capsule TAKE 1 CAPSULE (75 MG TOTAL) BY MOUTH DAILY WITH BREAKFAST.     Allergies:   Compazine [prochlorperazine] and Ondansetron hcl   Social History   Socioeconomic History   Marital status: Single    Spouse name: Not on file   Number of children: 4   Years of education: 9   Highest education level: Not on file  Occupational History  Occupation: Unemployed  Tobacco Use   Smoking status: Every Day    Packs/day: 0.50    Years: 15.00    Pack years: 7.50    Types: Cigarettes   Smokeless tobacco: Never   Tobacco comments:    6 cig per day  Vaping Use   Vaping Use: Never used  Substance and Sexual Activity   Alcohol use: No    Alcohol/week: 0.0 standard drinks   Drug use: No   Sexual activity: Yes    Birth control/protection: None  Other Topics Concern   Not on file  Social History Narrative   Lives at home with fiance and kids   Caffeine use: 2 cups coffee per day   4 Dr. Malachi Bonds (12oz) per day    Social Determinants of Health   Financial Resource Strain: Not on file  Food Insecurity: Not on file  Transportation Needs: Not on file  Physical Activity: Not on file  Stress: Not on file  Social Connections: Not on file     Family History: The patient's family history includes Asthma in her father; Breast cancer in her maternal aunt; Cervical cancer in her mother; Coronary artery disease (age of onset: 37) in her father; Hypertension in her mother; Prostate cancer in her cousin. There is no history of Migraines.  ROS:   Please see the history of present illness.     All other systems  reviewed and are negative.  EKGs/Labs/Other Studies Reviewed:    The following studies were reviewed today:  Echo 04/11/2019 1. Left ventricular ejection fraction, by visual estimation, is 25 to  30%. The left ventricle has severely decreased function. There is no left  ventricular hypertrophy.   2. Mild dyskinesis of the left ventricular, entire apical segment.   3. Severe hypokinesis of the left ventricular, entire anteroseptal wall  and anterior wall.   4. Severe hypokinesis of the left ventricular, entire inferolateral wall.   5. Elevated left atrial pressure.   6. Left ventricular diastolic parameters are consistent with Grade II  diastolic dysfunction (pseudonormalization).   7. The left ventricle demonstrates global hypokinesis.   8. Global right ventricle has moderately reduced systolic function.The  right ventricular size is normal. No increase in right ventricular wall  thickness.   9. Left atrial size was mildly dilated.  10. Right atrial size was normal.  11. Trivial pericardial effusion is present.  12. The mitral valve is normal in structure. Mild mitral valve  regurgitation.  13. The tricuspid valve is normal in structure.  14. The aortic valve is normal in structure. Aortic valve regurgitation is  not visualized.  15. The pulmonic valve was not well visualized. Pulmonic valve  regurgitation is not visualized.  16. Mildly elevated pulmonary artery systolic pressure.  17. The inferior vena cava is dilated in size with <50% respiratory  variability, suggesting right atrial pressure of 15 mmHg.  18. The left ventricular function has critically worse.  19. No intracardiac thrombi or masses were visualized.   EKG:  EKG is not ordered today.    Recent Labs: 08/25/2020: ALT 29; BNP 28.8; BUN 9; Creatinine, Ser 0.86; Potassium 4.3; Sodium 139  Recent Lipid Panel    Component Value Date/Time   CHOL 113 08/25/2020 1124   TRIG 106 08/25/2020 1124   HDL 39 (L) 08/25/2020  1124   CHOLHDL 2.9 08/25/2020 1124   CHOLHDL 3.9 09/18/2015 0340   VLDL 25 09/18/2015 0340   LDLCALC 54 08/25/2020 1124     Risk  Assessment/Calculations:       Physical Exam:    VS:  BP 138/72   Pulse 83   Ht $R'5\' 3"'CC$  (1.6 m)   Wt 182 lb 3.2 oz (82.6 kg)   SpO2 94%   BMI 32.28 kg/m     Wt Readings from Last 3 Encounters:  09/05/20 182 lb 3.2 oz (82.6 kg)  08/15/20 178 lb 6.4 oz (80.9 kg)  07/02/19 173 lb 12.8 oz (78.8 kg)     GEN:  Well nourished, well developed in no acute distress HEENT: Normal NECK: No JVD; No carotid bruits LYMPHATICS: No lymphadenopathy CARDIAC: RRR, no murmurs, rubs, gallops RESPIRATORY:  Clear to auscultation without rales, wheezing or rhonchi  ABDOMEN: Soft, non-tender, non-distended MUSCULOSKELETAL:  No edema; No deformity  SKIN: Warm and dry NEUROLOGIC:  Alert and oriented x 3 PSYCHIATRIC:  Normal affect   ASSESSMENT:    1. Decreased breath sounds   2. NICM (nonischemic cardiomyopathy) ([place])   3. Coronary artery disease involving native coronary artery of native heart without angina pectoris   4. Hyperlipidemia LDL goal <70    PLAN:    In order of problems listed above:  Decreased breath sound: Noted to have decreased breath sounds in the right upper lobe of the lung.  She denies any significant shortness of breath.  Will obtain chest x-ray.  Nonischemic cardiomyopathy: I previously called and updated prior authorization for her Delene Loll in April last year, for some reason she still was not able to obtain the medication.  She was given a prescription of Entresto recently by my colleague, so far she has not been able to pick up the medication due to lack of prior authorization.  I will call her insurance again to do her prior authorizations this year.  CAD: History of nonobstructive disease.  Denies any chest pain  Hyperlipidemia: On Lipitor.         Medication Adjustments/Labs and Tests Ordered: Current medicines are reviewed at  length with the patient today.  Concerns regarding medicines are outlined above.  Orders Placed This Encounter  Procedures   DG Chest 2 View   No orders of the defined types were placed in this encounter.   Patient Instructions  Medication Instructions:  Your physician recommends that you continue on your current medications as directed. Please refer to the Current Medication list given to you today.  *If you need a refill on your cardiac medications before your next appointment, please call your pharmacy*  Lab Work: NONE ordered at this time of appointment   If you have labs (blood work) drawn today and your tests are completely normal, you will receive your results only by: [place] (if you have MyChart) OR A paper copy in the mail If you have any lab test that is abnormal or we need to change your treatment, we will call you to review the results.  Testing/Procedures: A chest x-ray takes a picture of the organs and structures inside the chest, including the heart, lungs, and blood vessels. This test can show several things, including, whether the heart is enlarges; whether fluid is building up in the lungs; and whether pacemaker / defibrillator leads are still in place. This test is performed at [facility] located at [address], [place] [address]  Please have test performed by end of week   Follow-Up: At [facility], you and your health needs are our priority.  As part of our continuing mission to provide you with exceptional heart  care, we have created designated Provider Care Teams.  These Care Teams include your primary Cardiologist (physician) and Advanced Practice Providers (APPs -  Physician Assistants and Nurse Practitioners) who all work together to provide you with the care you need, when you need it.   Your next appointment:   3 month(s)  The format for your next appointment:   In Person  Provider:   Shelva Majestic, MD  Other  Instructions    Signed, Almyra Deforest, [place]  09/07/2020 10:57 PM    [place]

## 2020-09-05 NOTE — Patient Instructions (Addendum)
Medication Instructions:  Your physician recommends that you continue on your current medications as directed. Please refer to the Current Medication list given to you today.  *If you need a refill on your cardiac medications before your next appointment, please call your pharmacy*  Lab Work: NONE ordered at this time of appointment   If you have labs (blood work) drawn today and your tests are completely normal, you will receive your results only by: [place] (if you have MyChart) OR A paper copy in the mail If you have any lab test that is abnormal or we need to change your treatment, we will call you to review the results.  Testing/Procedures: A chest x-ray takes a picture of the organs and structures inside the chest, including the heart, lungs, and blood vessels. This test can show several things, including, whether the heart is enlarges; whether fluid is building up in the lungs; and whether pacemaker / defibrillator leads are still in place. This test is performed at [facility] located at [address], [place] [address]  Please have test performed by end of week   Follow-Up: At [facility], you and your health needs are our priority.  As part of our continuing mission to provide you with exceptional heart care, we have created designated Provider Care Teams.  These Care Teams include your primary Cardiologist (physician) and Advanced Practice Providers (APPs -  Physician Assistants and Nurse Practitioners) who all work together to provide you with the care you need, when you need it.   Your next appointment:   3 month(s)  The format for your next appointment:   In Person  Provider:   Shelva Majestic, MD  Other Instructions

## 2020-09-06 ENCOUNTER — Telehealth: Payer: Self-pay

## 2020-09-06 NOTE — Telephone Encounter (Signed)
A prior Authorization was called into [employer] on 09/05/20 for Entresto. Fax received from [facility] that patient was patient was approved for Entresto 49-51 mg a quantity of 180 for 90 days from 09/05/20-09/05/21.   Patient was called and informed that the authorization was approved and that she can go to her local pharmacy and pick up her prescription. She thanked me for calling and verbalized understanding. All (if any) questions were answered.

## 2020-09-07 ENCOUNTER — Encounter: Payer: Self-pay | Admitting: Physician Assistant

## 2020-09-08 ENCOUNTER — Ambulatory Visit
Admission: RE | Admit: 2020-09-08 | Discharge: 2020-09-08 | Disposition: A | Discharge status: Home / Self Care | Payer: Medicaid Other | Source: Ambulatory Visit | Attending: Physician Assistant | Admitting: Physician Assistant

## 2020-09-08 DIAGNOSIS — R0689 Other abnormalities of breathing: Secondary | ICD-10-CM

## 2020-09-09 NOTE — Progress Notes (Signed)
Normal chest x ray result

## 2020-10-06 ENCOUNTER — Encounter: Payer: Self-pay | Admitting: Internal Medicine

## 2020-11-01 ENCOUNTER — Other Ambulatory Visit: Payer: Self-pay

## 2020-11-02 ENCOUNTER — Other Ambulatory Visit: Payer: Self-pay | Admitting: Cardiovascular Disease

## 2020-11-02 MED ORDER — ATORVASTATIN CALCIUM 80 MG PO TABS: 80.0000 mg | ORAL_TABLET | Freq: Every day | ORAL | 2 refills | Status: DC

## 2020-11-13 ENCOUNTER — Ambulatory Visit (INDEPENDENT_AMBULATORY_CARE_PROVIDER_SITE_OTHER): Payer: Medicaid Other | Admitting: Internal Medicine

## 2020-11-13 ENCOUNTER — Other Ambulatory Visit: Payer: Self-pay

## 2020-11-13 ENCOUNTER — Encounter: Payer: Self-pay | Admitting: Internal Medicine

## 2020-11-13 ENCOUNTER — Ambulatory Visit (INDEPENDENT_AMBULATORY_CARE_PROVIDER_SITE_OTHER): Payer: Medicaid Other

## 2020-11-13 VITALS — BP 118/72 | HR 72 | Temp 98.2°F | Ht 63.0 in | Wt 182.8 lb

## 2020-11-13 DIAGNOSIS — J449 Chronic obstructive pulmonary disease, unspecified: Secondary | ICD-10-CM | POA: Insufficient documentation

## 2020-11-13 DIAGNOSIS — J4540 Moderate persistent asthma, uncomplicated: Secondary | ICD-10-CM

## 2020-11-13 DIAGNOSIS — F17210 Nicotine dependence, cigarettes, uncomplicated: Secondary | ICD-10-CM | POA: Diagnosis not present

## 2020-11-13 LAB — TSH: TSH: 1.89 u[IU]/mL (ref 0.35–5.50)

## 2020-11-13 LAB — CBC WITH DIFFERENTIAL/PLATELET
Basophils Absolute: 0.1 10*3/uL (ref 0.0–0.1)
Basophils Relative: 1.1 % (ref 0.0–3.0)
Eosinophils Absolute: 0.2 10*3/uL (ref 0.0–0.7)
Eosinophils Relative: 2.2 % (ref 0.0–5.0)
HCT: 41.7 % (ref 36.0–46.0)
Hemoglobin: 13.8 g/dL (ref 12.0–15.0)
Lymphocytes Relative: 35.5 % (ref 12.0–46.0)
Lymphs Abs: 3.4 10*3/uL (ref 0.7–4.0)
MCHC: 33.1 g/dL (ref 30.0–36.0)
MCV: 93.6 fl (ref 78.0–100.0)
Monocytes Absolute: 0.7 10*3/uL (ref 0.1–1.0)
Monocytes Relative: 6.9 % (ref 3.0–12.0)
Neutro Abs: 5.2 10*3/uL (ref 1.4–7.7)
Neutrophils Relative %: 54.3 % (ref 43.0–77.0)
Platelets: 226 10*3/uL (ref 150.0–400.0)
RBC: 4.46 Mil/uL (ref 3.87–5.11)
RDW: 14.3 % (ref 11.5–15.5)
WBC: 9.5 10*3/uL (ref 4.0–10.5)

## 2020-11-13 LAB — SEDIMENTATION RATE: Sed Rate: 8 mm/hr (ref 0–20)

## 2020-11-13 LAB — BRAIN NATRIURETIC PEPTIDE: Pro B Natriuretic peptide (BNP): 37 pg/mL (ref 0.0–100.0)

## 2020-11-13 LAB — BASIC METABOLIC PANEL
BUN: 5 mg/dL — ABNORMAL LOW (ref 6–23)
CO2: 27 mEq/L (ref 19–32)
Calcium: 10.2 mg/dL (ref 8.4–10.5)
Chloride: 108 mEq/L (ref 96–112)
Creatinine, Ser: 0.94 mg/dL (ref 0.40–1.20)
GFR: 71.55 mL/min (ref >60.00)
Glucose, Bld: 78 mg/dL (ref 70–99)
Potassium: 3.8 mEq/L (ref 3.5–5.1)
Sodium: 142 mEq/L (ref 135–145)

## 2020-11-13 MED ORDER — PREDNISONE 10 MG PO TABS: ORAL_TABLET | ORAL | 0 refills | Status: DC

## 2020-11-13 NOTE — Patient Instructions (Signed)
Plan A = Automatic = Always=    Dulera 200 Take 2 puffs first thing in am and then another 2 puffs about 12 hours later.   Work on inhaler technique:  relax and gently blow all the way out then take a nice smooth full deep breath back in, triggering the inhaler at same time you start breathing in.  Hold for up to 5 seconds if you can. Blow out thru nose. Rinse and gargle with water when done.  If mouth or throat bother you at all,  try brushing teeth/gums/tongue with arm and hammer toothpaste/ make a slurry and gargle and spit out.      Plan B = Backup (to supplement plan A, not to replace it) Only use your albuterol inhaler as a rescue medication to be used if you can't catch your breath by resting or doing a relaxed purse lip breathing pattern.  - The less you use it, the better it will work when you need it. - Ok to use the inhaler up to 2 puffs  every 4 hours if you must but call for appointment if use goes up over your usual need - Don't leave home without it !!  (think of it like starter fluid or a  spare tire for your car)   Plan C = Crisis (instead of Plan B but only if Plan B stops working) - only use your albuterol nebulizer if you first try Plan B and it fails to help > ok to use the nebulizer up to every 4 hours but if start needing it regularly call for immediate appointment   Prednisone 10 mg take  4 each am x 2 days,   2 each am x 2 days,  1 each am x 2 days and stop   The key is to stop smoking completely before smoking completely stops you!   Sign for Dr Marcelle Smiling  PFTs  Please schedule a follow up office visit in 6-8 weeks,  with PFTs on return.

## 2020-11-13 NOTE — Progress Notes (Signed)
[name], female    DOB: [birth year],   MRN: [id number]   Brief patient profile:   45 yobf active smoker intermittent asthma as  child resolved by [facility]  referred to pulmonary clinic 11/13/2020 by  Tereasa Coop, PA for asthma.  No problem with IUP x 4 last one 1995 then 2003 onset variable doe rx intermittent saba but persistent rx 2010> Javid rec dulera  100 2 bid seemed to help initially since 2020 not working as well.  Worse breathing and coughing since covid July 16th  2022    History of Present Illness  11/13/2020  Pulmonary/ 1st office eval/Derrick Orris  Chief Complaint  Patient presents with   Consult    Asthma  Dyspnea:  treadmill about 30 min 3 x weekly at home at [place] ?  But flat / vaccuuming is the challenge as are steps/ says 2 aisles at [employer] and has stop Cough: worse with walking ?  Worse since  on enteresto, worse in am / non productive Sleep: 45 degrees x years / losts of pillows SABA use: last used saba 1.5 h prio uses avg 4 x daily     No obvious day to day or daytime variability or assoc excess/ purulent sputum or mucus plugs or hemoptysis or cp or chest tightness, subjective wheeze or overt sinus or hb symptoms.   Sleeping  without nocturnal  or early am exacerbation  of respiratory  c/o's or need for noct saba. Also denies any obvious fluctuation of symptoms with weather or environmental changes or other aggravating or alleviating factors except as outlined above   No unusual exposure hx or h/o childhood pna  or knowledge of premature birth.  Current Allergies, Complete Past Medical History, Past Surgical History, Family History, and Social History were reviewed in [employer] record.  ROS  The following are not active complaints unless bolded Hoarseness, sore throat, dysphagia, dental problems, itching, sneezing,  nasal congestion or discharge of excess mucus or purulent secretions, ear ache,   fever, chills, sweats, unintended wt  loss or wt gain, classically pleuritic or exertional cp,  orthopnea pnd or arm/hand swelling  or leg swelling, presyncope, palpitations, abdominal pain, anorexia, nausea, vomiting, diarrhea  or change in bowel habits or change in bladder habits, change in stools or change in urine, dysuria, hematuria,  rash, arthralgias, visual complaints, headache, numbness, weakness or ataxia or problems with walking or coordination,  change in mood or  memory.           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  albuterol (PROVENTIL) (2.5 MG/3ML) 0.083% nebulizer solution Take 3 mLs (2.5 mg total) by nebulization every 6 (six) hours as needed for wheezing or shortness of breath. 75 mL 12   albuterol (VENTOLIN HFA) 108 (90 Base) MCG/ACT inhaler Inhale 2 puffs into the lungs every 6 (six) hours as needed for wheezing or shortness of breath.     aspirin 81 MG chewable tablet Chew 81 mg by mouth daily.     atorvastatin (LIPITOR) 80 MG tablet Take 1 tablet (80 mg total) by mouth daily at 6 PM. 90 tablet 2   [employer] D3 5000 units capsule Take 5,000 Units by mouth daily.  11   furosemide (LASIX) 40 MG tablet Take 1 tablet (40 mg total) by mouth daily. 30 tablet 3   metoprolol succinate (TOPROL-XL) 100 MG  24 hr tablet Take 1 tablet (100 mg total) by mouth daily. 30 tablet 3   mometasone-formoterol (DULERA) 200-5 MCG/ACT AERO Inhale 2 puffs into the lungs 2 (two) times daily. 13 g 0   nitroGLYCERIN (NITROSTAT) 0.4 MG SL tablet Place 1 tablet (0.4 mg total) under the tongue every 5 (five) minutes as needed for chest pain. 25 tablet 3   sacubitril-valsartan (ENTRESTO) 49-51 MG Take 1 tablet by mouth 2 (two) times daily. 60 tablet 3   spironolactone (ALDACTONE) 25 MG tablet TAKE 1 TABLET BY MOUTH EVERY DAY 90 tablet 3   anastrozole (ARIMIDEX) 1 MG tablet TAKE 1 TABLET BY MOUTH EVERY DAY 30 tablet 0   cetirizine (ZYRTEC) 10 MG tablet Take 10 mg by mouth daily as needed for allergies.   11   venlafaxine XR (EFFEXOR-XR) 75 MG 24 hr capsule TAKE 1 CAPSULE (75 MG TOTAL) BY MOUTH DAILY WITH BREAKFAST. 30 capsule 11   No facility-administered medications prior to visit.     Objective:     BP 118/72 (BP Location: Left Arm, Cuff Size: Normal)   Pulse 72   Temp 98.2 F (36.8 C) (Oral)   Ht '5\' 3"'  (1.6 m)   Wt 182 lb 12.8 oz (82.9 kg)   SpO2 96% Comment: RA  BMI 32.38 kg/m   SpO2: 96 % (RA)  Amb bf nad   HEENT : pt wearing mask not removed for exam due to covid -19 concerns.    NECK :  without JVD/Nodes/TM/ nl carotid upstrokes bilaterally   LUNGS: no acc muscle use,  Nl contour chest with mid exp wheeze  bilaterally without cough on insp or exp maneuvers   CV:  RRR  no s3 or murmur or increase in P2, and no edema   ABD:  soft and nontender with nl inspiratory excursion in the supine position. No bruits or organomegaly appreciated, bowel sounds nl  MS:  Nl gait/ ext warm without deformities, calf tenderness, cyanosis or clubbing No obvious joint restrictions   SKIN: warm and dry without lesions    NEURO:  alert, approp, nl sensorium with  no motor or cerebellar deficits apparent.    CXR PA and Lateral:   11/13/2020 :    I personally reviewed images and agree with radiology  impression as follows:    Negative for acute cardiopulmonary disease   Labs ordered/ reviewed:      Chemistry      Component Value Date/Time   NA 142 11/13/2020 1125   NA 139 08/25/2020 1124   NA 142 10/11/2016 0955   K 3.8 11/13/2020 1125   K 3.2 (L) 10/11/2016 0955   CL 108 11/13/2020 1125  CO2 27 11/13/2020 1125   CO2 23 10/11/2016 0955   BUN 5 (L) 11/13/2020 1125   BUN 9 08/25/2020 1124   BUN 6.8 (L) 10/11/2016 0955   CREATININE 0.94 11/13/2020 1125   CREATININE 0.7 10/11/2016 0955      Component Value Date/Time   CALCIUM 10.2 11/13/2020 1125   CALCIUM 9.5 10/11/2016 0955   ALKPHOS 220 (H) 08/25/2020 1124   ALKPHOS 85 10/11/2016 0955   AST 21 08/25/2020 1124   AST 10 10/11/2016 0955   ALT 29 08/25/2020 1124   ALT 10 10/11/2016 0955   BILITOT 0.4 08/25/2020 1124   BILITOT 0.44 10/11/2016 0955        Lab Results  Component Value Date   WBC 9.5 11/13/2020   HGB 13.8 11/13/2020   HCT 41.7 11/13/2020   MCV 93.6 11/13/2020   PLT 226.0 11/13/2020     Lab Results  Component Value Date   DDIMER <0.19 11/13/2020      Lab Results  Component Value Date   TSH 1.89 11/13/2020     Lab Results  Component Value Date   PROBNP 37.0 11/13/2020       Lab Results  Component Value Date   ESRSEDRATE 8 11/13/2020         Labs ordered 11/13/2020  :  allergy profile       Assessment   COPD  GOLD ? / active smoker Onset around 2010  - Active smoker - 11/13/2020  After extensive coaching inhaler device,  effectiveness =    75% > resume duler 200 2bid and return for pfts/ req priors from Dr Freda Munro  - Allergy profile 11/13/2020 >  Eos 0.2 /  IgE  pending  DDX of  difficult airways management almost all start with A and  include Adherence, Ace Inhibitors, Acid Reflux, Active Sinus Disease, Alpha 1 Antitripsin deficiency, Anxiety masquerading as Airways dz,  ABPA,  Allergy(esp in young), Aspiration (esp in elderly), Adverse effects of meds,  Active smoking or vaping, A  bunch of PE's (a small clot burden can't cause this syndrome unless there is already severe underlying pulm or vascular dz with poor reserve) plus two Bs  = Bronchiectasis and Beta blocker use..and one C= CHF   Adherence is always the initial "prime suspect" and is a multilayered concern that requires a "trust but verify" approach in every patient - starting with knowing how to use medications, especially inhalers, correctly, keeping up with refills and understanding the fundamental difference between maintenance and prns vs those medications only taken for a very short course and then stopped and not refilled.  - see hfa teaching - return with all meds in hand using a trust but verify approach to confirm accurate Medication  Reconciliation The principal here is that until we are certain that the  patients are doing what we've asked, it makes no sense to ask them to do more.    Active smoking at top of the list > see sep a/p  ? Allergy component > check profile, use high dose ICS for now, Prednisone 10 mg take  4 each am x 2 days,   2 each am x 2 days,  1 each am x 2 days and stop    ? A bunch of PEs > D dimer nl - while a normal   value    may miss small peripheral pe, the clot burden with sob is moderately high and the d dimer  has a very high neg  pred value if used in this setting.    ? BB effects: In the setting of respiratory symptoms of unknown etiology,  It would be preferable to use bystolic, the most beta -1  selective Beta blocker available in sample form, with bisoprolol the most selective generic choice  on the market, at least on a trial basis, to make sure the spillover Beta 2 effects of the less specific Beta blockers are not contributing to this patient's symptoms> consider p reviewe pfts/ response to above rx    ? chf component > appears well compensated / bnp nl    >>> f/u in 6-8 weeks with pfts/ consider adding lama or doing triple rx inhaler pending results      Cigarette  smoker Counseled re importance of smoking cessation but did not meet time criteria for separate billing          Each maintenance medication was reviewed in detail including emphasizing most importantly the difference between maintenance and prns and under what circumstances the prns are to be triggered using an action plan format where appropriate.  Total time for H and P, chart review, counseling, reviewing hfa device(s) and generating customized AVS unique to this office visit / same day charting = 45 min       Christinia Gully, MD 11/13/2020

## 2020-11-14 ENCOUNTER — Encounter: Payer: Self-pay | Admitting: Internal Medicine

## 2020-11-14 ENCOUNTER — Encounter: Payer: Self-pay | Admitting: *Deleted

## 2020-11-14 DIAGNOSIS — F17210 Nicotine dependence, cigarettes, uncomplicated: Secondary | ICD-10-CM | POA: Insufficient documentation

## 2020-11-14 LAB — D-DIMER, QUANTITATIVE: D-Dimer, Quant: <0.19 mcg/mL FEU (ref <0.50)

## 2020-11-14 LAB — IGE: IgE (Immunoglobulin E), Serum: 40 kU/L (ref <=114)

## 2020-11-14 NOTE — Assessment & Plan Note (Signed)
Counseled re importance of smoking cessation but did not meet time criteria for separate billing   °

## 2020-11-14 NOTE — Assessment & Plan Note (Addendum)
Onset around 2010  - Active smoker - 11/13/2020  After extensive coaching inhaler device,  effectiveness =    75% > resume duler 200 2bid and return for pfts/ req priors from Dr Freda Munro  - Allergy profile 11/13/2020 >  Eos 0.2 /  IgE  pending  DDX of  difficult airways management almost all start with A and  include Adherence, Ace Inhibitors, Acid Reflux, Active Sinus Disease, Alpha 1 Antitripsin deficiency, Anxiety masquerading as Airways dz,  ABPA,  Allergy(esp in young), Aspiration (esp in elderly), Adverse effects of meds,  Active smoking or vaping, A bunch of PE's (a small clot burden can't cause this syndrome unless there is already severe underlying pulm or vascular dz with poor reserve) plus two Bs  = Bronchiectasis and Beta blocker use..and one C= CHF   Adherence is always the initial "prime suspect" and is a multilayered concern that requires a "trust but verify" approach in every patient - starting with knowing how to use medications, especially inhalers, correctly, keeping up with refills and understanding the fundamental difference between maintenance and prns vs those medications only taken for a very short course and then stopped and not refilled.  - see hfa teaching - return with all meds in hand using a trust but verify approach to confirm accurate Medication  Reconciliation The principal here is that until we are certain that the  patients are doing what we've asked, it makes no sense to ask them to do more.    Active smoking at top of the list > see sep a/p  ? Allergy component > check profile, use high dose ICS for now, Prednisone 10 mg take  4 each am x 2 days,   2 each am x 2 days,  1 each am x 2 days and stop    ? A bunch of PEs > D dimer nl - while a normal   value    may miss small peripheral pe, the clot burden with sob is moderately high and the d dimer  has a very high neg pred value if used in this setting.    ? BB effects: In the setting of respiratory symptoms of unknown  etiology,  It would be preferable to use bystolic, the most beta -1  selective Beta blocker available in sample form, with bisoprolol the most selective generic choice  on the market, at least on a trial basis, to make sure the spillover Beta 2 effects of the less specific Beta blockers are not contributing to this patient's symptoms> consider p reviewe pfts/ response to above rx    ? chf component > appears well compensated / bnp nl    >>> f/u in 6-8 weeks with pfts/ consider adding lama or doing triple rx inhaler pending results           Each maintenance medication was reviewed in detail including emphasizing most importantly the difference between maintenance and prns and under what circumstances the prns are to be triggered using an action plan format where appropriate.  Total time for H and P, chart review, counseling, reviewing hfa device(s) and generating customized AVS unique to this office visit / same day charting = 45 min

## 2020-11-22 ENCOUNTER — Other Ambulatory Visit: Payer: Self-pay

## 2020-11-22 ENCOUNTER — Encounter: Payer: Self-pay | Admitting: Cardiovascular Disease

## 2020-11-22 ENCOUNTER — Ambulatory Visit: Payer: Medicaid Other | Admitting: Cardiovascular Disease

## 2020-11-22 DIAGNOSIS — Z720 Tobacco use: Secondary | ICD-10-CM

## 2020-11-22 DIAGNOSIS — Z79899 Other long term (current) drug therapy: Secondary | ICD-10-CM | POA: Diagnosis not present

## 2020-11-22 DIAGNOSIS — I428 Other cardiomyopathies: Secondary | ICD-10-CM

## 2020-11-22 DIAGNOSIS — E785 Hyperlipidemia, unspecified: Secondary | ICD-10-CM | POA: Diagnosis not present

## 2020-11-22 DIAGNOSIS — I10 Essential (primary) hypertension: Secondary | ICD-10-CM | POA: Diagnosis not present

## 2020-11-22 DIAGNOSIS — J4520 Mild intermittent asthma, uncomplicated: Secondary | ICD-10-CM

## 2020-11-22 MED ORDER — SACUBITRIL-VALSARTAN 97-103 MG PO TABS: 1.0000 | ORAL_TABLET | Freq: Two times a day (BID) | ORAL | 11 refills | Status: DC

## 2020-11-22 NOTE — Patient Instructions (Signed)
Medication Instructions:  INCREASE- Entresto 97/103 mg by mouth twice a day  *If you need a refill on your cardiac medications before your next appointment, please call your pharmacy*   Lab Work: BMP and Pro BNP in 2 weeks  If you have labs (blood work) drawn today and your tests are completely normal, you will receive your results only by: [place] (if you have MyChart) OR A paper copy in the mail If you have any lab test that is abnormal or we need to change your treatment, we will call you to review the results.   Testing/Procedures: Your physician has requested that you have an echocardiogram in 3 Months. Echocardiography is a painless test that uses sound waves to create images of your heart. It provides your doctor with information about the size and shape of your heart and how well your heart's chambers and valves are working. This procedure takes approximately one hour. There are no restrictions for this procedure.   Follow-Up: At [facility], you and your health needs are our priority.  As part of our continuing mission to provide you with exceptional heart care, we have created designated Provider Care Teams.  These Care Teams include your primary Cardiologist (physician) and Advanced Practice Providers (APPs -  Physician Assistants and Nurse Practitioners) who all work together to provide you with the care you need, when you need it.  We recommend signing up for the patient portal called "MyChart".  Sign up information is provided on this After Visit Summary.  MyChart is used to connect with patients for Virtual Visits (Telemedicine).  Patients are able to view lab/test results, encounter notes, upcoming appointments, etc.  Non-urgent messages can be sent to your provider as well.   To learn more about what you can do with MyChart, go to [email or web address].    Your next appointment:   4 month(s)  The format for your next appointment:   In Person  Provider:    You may see Shelva Majestic, MD or one of the following Advanced Practice Providers on your designated Care Team:   Almyra Deforest, PA-C Fabian Sharp, PA-C or  Roby Lofts, [place]

## 2020-11-22 NOTE — Progress Notes (Signed)
Cardiology Office Note    Date:  11/27/2020   ID:  [name], [name] [birth year], MRN [id number]  PCP:  Sue Lush, PA-C  Cardiologist:  Shelva Majestic, MD   No chief complaint on file.  19 month F/U office visit   History of Present Illness:  [name] is a 50 y.o. female presents who was last seen by me on April 22, 2019.  She presents for follow-up cardiology evaluation.  [name] has a history of renal vascular hypertension and is status post remote left renal artery stenting, right-sided Bell's palsy in 2001, with residual right-sided facial weakness, and a history of lymphoma the neck.  She suffered a non-ST segment elevation myocardial infarction in June 2017.  Cardiac catheterization by Dr. Ellyn Hack revealed a 65% stenosis in a tortuous segment and medical therapy was recommended.  She had seen Kerin Ransom in follow-up of her hospitalization in July 2017.  She was seen by Charlotta Newton, NP for preoperative evaluation prior to breast cancer surgery for which she was needed to be off Plavix.  Subsequently, she was seen by [place].  An ECG showed T-wave inversion V2 through V6 and underwent a nuclear stress test which showed a prior apical infarct and minimal peri-infarct ischemia. EF was estimated at 43% with apical akineis.  I saw her for my initial evaluation with me in the office in January 2019.  At that time she denied any episodes of chest pain or shortness of breath.  On April 01, 2017  she underwent resection of 6 axillary lymph nodes.  She had been off Plavix for several months.   She is now felt to have breast cancer in remission.  She was recently hospitalized from January 17 through April 16, 2019 after presenting with acute hyper cardiac respiratory failure which was sudden in onset regarding the patient.  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  Covid testing was negative.  She self extubated herself on January 18 and was hypertensive.  She was treated with nitrates and Catapres and was started on IV steroids with ultimate transition to prednisone.  Bronchodilators were changed.  During her hypertensive emergency she was seen by cardiology and an echo showed marked reduction in LV function compared to 2017 when her EF was 45 to now being reduced to 25 to 30%.  She was aggressively diuresed and guideline directed medical therapy with Entresto and spironolactone were added to her regimen.  She subsequently underwent right and left heart cardiac catheterization on April 15, 2019 which showed patent coronary arteries and her PA systolic pressure was 33 mm.  I saw her on April 22, 2019 following her hospital discharge.  At that time she was breathing better but still appears of diaphoresis and still experience some mild shortness of breath with activity.  With her continued shortness of breath and blood pressure elevation I recommended slight titration of furosemide to 40 mg daily.  She had been started on Entresto 49/51 mg twice a day for only 2 weeks and at that time did not further titrate Entresto to its maximum dosing.  I recommended follow-up laboratory.  Since I saw her, she was evaluated by Fabian Sharp, PA on Aug 15, 2020 and [place] on [place], [place] on September 05, 2020. She had issues with [facility] authorization and ultimately had been out of her medications particularly including Entresto and Lipitor.  Pharmacy authorization was ultimately obtained.  Due to decreased breath sounds at the base of her lungs she was referred for chest x-ray which was done on September 08, 2020 which was negative for acute cardiopulmonary disease without evidence for central vascular congestion or interlobular septal thickening.  Presently, she is breathing better now that she is back on Entresto currently 49/51 mg twice a day.  She continues to be on  spironolactone 25 mg daily, metoprolol succinate 100 mg and furosemide 40 mg daily.  She is back on atorvastatin 80 mg.  She had a COVID infection in July 2022.  She had recently seen Dr. Melvyn Novas and smoking cessation was again advised.  She was started on a prednisone taper and Dulera.  She denies any chest pain or tightness.  She presents for cardiology follow-up evaluation.  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 2002   Myocardial infarction [facility]) 2017   Renal artery stenosis ([facility])    s/p stent placment to left    Respiratory failure ([place]) 03/2019    Past Surgical History:  Procedure Laterality Date   AXILLARY LYMPH NODE DISSECTION Right 03/13/2017   Procedure: AXILLARY LYMPH NODE DISSECTION;  Surgeon: Erroll Luna, MD;  Location: [facility];  Service: General;  Laterality: Right;   BREAST RECONSTRUCTION WITH PLACEMENT OF TISSUE EXPANDER AND FLEX HD (ACELLULAR HYDRATED DERMIS) Right 01/30/2017   Procedure: RIGHT BREAST RECONSTRUCTION WITH PLACEMENT OF TISSUE EXPANDER AND FLEX HD (ACELLULAR HYDRATED DERMIS);  Surgeon: Wallace Going, DO;  Location: [facility];  Service: Plastics;  Laterality: Right;   BREAST SURGERY     breast bx  07/2016   CARDIAC CATHETERIZATION N/A 09/18/2015   Procedure: Left Heart Cath and Coronary Angiography;  Surgeon: Leonie Man, MD;  Location: [place] [facility];  Service: Cardiovascular;  Laterality: N/A;   [place]   x2   IR CV LINE INJECTION  07/24/2016   MASTECTOMY WITH RADIOACTIVE SEED GUIDED EXCISION AND AXILLARY SENTINEL LYMPH NODE BIOPSY Right  01/30/2017   Procedure: RIGHT SIMPLE MASTECTOMY WITH  RADIOACTIVE SEED TARGETED RIGHT AXILLARY LYMPH NODE EXCISION AND RIGHT AXILLARY SENTINEL LYMPH NODE BIOPSY;  Surgeon: Erroll Luna, MD;  Location: [place];  Service: General;  Laterality: Right;   PERIPHERAL VASCULAR CATHETERIZATION N/A 08/23/2014   Procedure: Renal Angiography;  Surgeon: Adrian Prows, MD;  Location: [place] [facility];  Service: Cardiovascular;  Laterality: N/A;   RENAL ARTERY STENT Left 08/23/2014   RIGHT/LEFT HEART CATH AND CORONARY ANGIOGRAPHY N/A 04/15/2019   Procedure: RIGHT/LEFT HEART CATH AND CORONARY ANGIOGRAPHY;  Surgeon: Lorretta Harp, MD;  Location: [facility];  Service: Cardiovascular;  Laterality: N/A;   SIMPLE MASTECTOMY Right 01/30/2017   SKIN BIOPSY  2000   TISSUE EXPANDER PLACEMENT Right 10/08/2017   Procedure: REMOVAL OF RIGHT BREAST TISSUE EXPANDER;  Surgeon: Wallace Going, DO;  Location: [facility];  Service: Plastics;  Laterality: Right;  Case should  be 45 min   TUBAL LIGATION      Current Medications: Outpatient Medications Prior to Visit  Medication Sig Dispense Refill   acetaminophen (TYLENOL) 500 MG tablet Take 1,000 mg by mouth every 8 (eight) hours as needed for mild pain or headache.     albuterol (PROVENTIL) (2.5 MG/3ML) 0.083% nebulizer solution Take 3 mLs (2.5 mg total) by nebulization every 6 (six) hours as needed for wheezing or shortness of breath. 75 mL 12   albuterol (VENTOLIN HFA) 108 (90 Base) MCG/ACT inhaler Inhale 2 puffs into the lungs every 6 (six) hours as needed for wheezing or shortness of breath.     aspirin 81 MG chewable tablet Chew 81 mg by mouth daily.     atorvastatin (LIPITOR) 80 MG tablet Take 1 tablet (80 mg total) by mouth daily at 6 PM. 90 tablet 2   CVS D3 5000 units capsule Take 5,000 Units by mouth daily.  11   furosemide (LASIX) 40 MG tablet Take 1 tablet (40 mg total) by mouth daily. 30 tablet 3   metoprolol succinate (TOPROL-XL) 100 MG 24 hr tablet Take  1 tablet (100 mg total) by mouth daily. 30 tablet 3   mometasone-formoterol (DULERA) 200-5 MCG/ACT AERO Inhale 2 puffs into the lungs 2 (two) times daily. 13 g 0   nitroGLYCERIN (NITROSTAT) 0.4 MG SL tablet Place 1 tablet (0.4 mg total) under the tongue every 5 (five) minutes as needed for chest pain. 25 tablet 3   spironolactone (ALDACTONE) 25 MG tablet TAKE 1 TABLET BY MOUTH EVERY DAY 90 tablet 3   sacubitril-valsartan (ENTRESTO) 49-51 MG Take 1 tablet by mouth 2 (two) times daily. 60 tablet 3   predniSONE (DELTASONE) 10 MG tablet Take  4 each am x 2 days,   2 each am x 2 days,  1 each am x 2 days and stop (Patient not taking: Reported on 11/22/2020) 14 tablet 0   No facility-administered medications prior to visit.     Allergies:   Compazine [prochlorperazine] and Ondansetron hcl   Social History   Socioeconomic History   Marital status: Single    Spouse name: Not on file   Number of children: 4   Years of education: 9   Highest education level: Not on file  Occupational History   Occupation: Unemployed  Tobacco Use   Smoking status: Every Day    Packs/day: 0.50    Years: 15.00    Pack years: 7.50    Types: Cigarettes   Smokeless tobacco: Never   Tobacco comments:    6 cig per day  Vaping Use   Vaping Use: Never used  Substance and Sexual Activity   Alcohol use: No    Alcohol/week: 0.0 standard drinks   Drug use: No   Sexual activity: Yes    Birth control/protection: None  Other Topics Concern   Not on file  Social History Narrative   Lives at home with fiance and kids   Caffeine use: 2 cups coffee per day   4 Dr. Malachi Bonds (12oz) per day    Social Determinants of Health   Financial Resource Strain: Not on file  Food Insecurity: Not on file  Transportation Needs: Not on file  Physical Activity: Not on file  Stress: Not on file  Social Connections: Not on file     Family History:  The patient's family history includes Asthma in her father; Breast cancer in her  maternal aunt; Cervical cancer in her mother; Coronary artery disease (  age of onset: 39) in her father; Hypertension in her mother; Prostate cancer in her cousin.   ROS General: Negative; No fevers, chills, or night sweats;  HEENT: Negative; No changes in vision or hearing, sinus congestion, difficulty swallowing Pulmonary: Positive for asthma Cardiovascular: Negative; No chest pain, presyncope, syncope, palpitations GI: Negative; No nausea, vomiting, diarrhea, or abdominal pain GU: Negative; No dysuria, hematuria, or difficulty voiding Musculoskeletal: Negative; no myalgias, joint pain, or weakness Hematologic/Oncology: Positive for breast CA Endocrine: Negative; no heat/cold intolerance; no diabetes Neuro: Negative; no changes in balance, headaches Skin: Negative; No rashes or skin lesions Psychiatric: Negative; No behavioral problems, depression Sleep: Negative; No snoring, daytime sleepiness, hypersomnolence, bruxism, restless legs, hypnogognic hallucinations, no cataplexy Other comprehensive 14 point system review is negative.   PHYSICAL EXAM:   VS:  BP 130/70   Pulse 71   Ht '5\' 3"'  (1.6 m)   Wt 183 lb (83 kg)   SpO2 95%   BMI 32.42 kg/m     Repeat blood pressure by me was 132/74.  Wt Readings from Last 3 Encounters:  11/22/20 183 lb (83 kg)  11/13/20 182 lb 12.8 oz (82.9 kg)  09/05/20 182 lb 3.2 oz (82.6 kg)    General: Alert, oriented, no distress.  Skin: normal turgor, no rashes, warm and dry HEENT: Normocephalic, atraumatic. Pupils equal round and reactive to light; sclera anicteric; extraocular muscles intact;  Nose without nasal septal hypertrophy Mouth/Parynx benign; Mallinpatti scale 3 Neck: No JVD, no carotid bruits; normal carotid upstroke Lungs: No audible wheezing, decreased breath sounds Chest wall: without tenderness to palpitation Heart: PMI not displaced, RRR, s1 s2 normal, 1/6 systolic murmur, no diastolic murmur, no rubs, gallops, thrills, or  heaves Abdomen: soft, nontender; no hepatosplenomehaly, BS+; abdominal aorta nontender and not dilated by palpation. Back: no CVA tenderness Pulses 2+ Musculoskeletal: full range of motion, normal strength, no joint deformities Extremities: no clubbing cyanosis or edema, Homan's sign negative  Neurologic: grossly nonfocal; Cranial nerves grossly wnl Psychologic: Normal mood and affect   Studies/Labs Reviewed:   November 22, 2020 ECG (independently read by me):  NSR at 71;small Q waves inferiorly, PRWP V1-28 March 2019 ECG (independently read by me): Normal sinus rhythm at 74 bpm.  Q waves inferiorly.  QTc interval increased at 479 ms.  No ectopy.  T wave abnormality in lead aVL  January 2019 ECG (independently read by me): Normal sinus rhythm at 66 bpm.  Poor anterior R-wave progression V1 to V3 with nonspecific T changes.  Normal intervals.  Recent Labs: BMP Latest Ref Rng & Units 11/13/2020 08/25/2020 07/02/2019  Glucose 70 - 99 mg/dL 78 91 91  BUN 6 - 23 mg/dL 5(L) 9 7  Creatinine 0.40 - 1.20 mg/dL 0.94 0.86 0.79  BUN/Creat Ratio 9 - 23 - 10 9  Sodium 135 - 145 mEq/L 142 139 141  Potassium 3.5 - 5.1 mEq/L 3.8 4.3 4.1  Chloride 96 - 112 mEq/L 108 105 106  CO2 19 - 32 mEq/L 27 19(L) 21  Calcium 8.4 - 10.5 mg/dL 10.2 10.6(H) 10.7(H)     Hepatic Function Latest Ref Rng & Units 08/25/2020 07/02/2019 04/11/2019  Total Protein 6.0 - 8.5 g/dL 6.5 6.6 6.5  Albumin 3.8 - 4.8 g/dL 4.5 4.6 3.6  AST 0 - 40 IU/L 21 18 82(H)  ALT 0 - 32 IU/L 29 13 46(H)  Alk Phosphatase 44 - 121 IU/L 220(H) 212(H) 172(H)  Total Bilirubin 0.0 - 1.2 mg/dL 0.4 0.2 0.6  Bilirubin, Direct  0.1 - 0.5 mg/dL - - -    CBC Latest Ref Rng & Units 11/13/2020 07/02/2019 04/16/2019  WBC 4.0 - 10.5 K/uL 9.5 8.8 17.1(H)  Hemoglobin 12.0 - 15.0 g/dL 13.8 13.9 13.6  Hematocrit 36.0 - 46.0 % 41.7 40.6 41.9  Platelets 150.0 - 400.0 K/uL 226.0 230 237   Lab Results  Component Value Date   MCV 93.6 11/13/2020   MCV 90 07/02/2019    MCV 91.1 04/16/2019   Lab Results  Component Value Date   TSH 1.89 11/13/2020   Lab Results  Component Value Date   HGBA1C 5.5 04/11/2019     BNP    Component Value Date/Time   BNP 28.8 08/25/2020 1124   BNP 348.6 (H) 04/11/2019 0520    ProBNP    Component Value Date/Time   PROBNP 37.0 11/13/2020 1125     Lipid Panel     Component Value Date/Time   CHOL 113 08/25/2020 1124   TRIG 106 08/25/2020 1124   HDL 39 (L) 08/25/2020 1124   CHOLHDL 2.9 08/25/2020 1124   CHOLHDL 3.9 09/18/2015 0340   VLDL 25 09/18/2015 0340   LDLCALC 54 08/25/2020 1124     RADIOLOGY: DG Chest 2 View  Result Date: 11/13/2020 CLINICAL DATA:  50 year old female with cough EXAM: CHEST - 2 VIEW COMPARISON:  09/08/2020 FINDINGS: Cardiomediastinal silhouette unchanged in size and contour. No evidence of central vascular congestion. No interlobular septal thickening. Unchanged left chest wall port catheter via subclavian approach. No pneumothorax or pleural effusion. Coarsened interstitial markings, with no confluent airspace disease. No acute displaced fracture. Degenerative changes of the spine. IMPRESSION: Negative for acute cardiopulmonary disease Electronically Signed   By: Corrie Mckusick D.O.   On: 11/13/2020 16:53      Additional studies/ records that were reviewed today include:   I extensively reviewed her most recent hospitalization, right and left heart cardiac catheterization, as well as echo data. Evaluations of Almyra Deforest and Fabian Sharp were reviewed and most recent Dr. Melvyn Novas pulmonary evaluation.  ASSESSMENT:    1. Nonischemic cardiomyopathy ([place])   2. Medication management   3. Hyperlipidemia LDL goal <70   4. Essential hypertension   5. Mild intermittent asthma without complication   6. Tobacco abuse     PLAN:  [name] is a 49 year old female who has a history of right-sided Bell's palsy with residual right-sided facial weakness, lymphoma, left renal artery stenosis  with stenting in May 2016, asthma, GERD, and tobacco abuse.  She suffered a non-ST segment elevation MI in June 2017.  Cardiac catheterization revealed a  65% stenosis in a very tortuous segment for which medical therapy was recommended.  Troponin was mildly elevated at 1.57.  I saw her on one occasion in the office in 2019.  She was subsequently found to have breast CA which is now felt to be in remission.  She was hospitalized in January 2021 with acute hypercarbic respiratory failure requiring intubation.  An echo Doppler study during that evaluation showed an EF of 25 to 30% with grade 2 diastolic dysfunction.  There was mild elevation of right heart pressures.  Cardiac catheterization did not demonstrate any significant coronary obstructive disease and she had mild increased PA systolic pressure at 33 mm.  During her hospitalization she was started on Entresto 49/51 mg twice a day, spironolactone 25 mg daily, Toprol-XL 50 mg twice a day and has also been on furosemide 20 mg.  With her continued shortness of breath and blood  pressure elevation at her last evaluation with me January 2021 I recommended  slight titration of furosemide to 40 mg.  She has only been on Entresto for 2 weeks and for this reason I did not further titrate Entresto to maximum dosing at 97/103 mg twice daily at this time.  Apparently, she had issues with authorization earlier this year but is now back on Entresto 49/51 mg twice a day and continues to take spironolactone 25 mg daily, metoprolol succinate 100 mg, and furosemide 40 mg daily.  Her blood pressure today is improved.  She is breathing better.  She recently had an asthmatic flare and had seen Dr. Melvyn Novas and was also started on a rapid prednisone taper.  Once she runs out of her 49/51 mg dose this month, I am titrating Entresto to 97/103 mg twice a day.  In 2 weeks I recommended a be met in follow-up proBNP.  In 3 months I have recommended she undergo a follow-up echo Doppler study  for reassessment of LV systolic and diastolic function with hopefully improvement.  She continues to be on atorvastatin.  LDL cholesterol in June 2022 was excellent at 54.  Smoking cessation was advised.  I will see her in 4 months for reevaluation.   Medication Adjustments/Labs and Tests Ordered: Current medicines are reviewed at length with the patient today.  Concerns regarding medicines are outlined above.  Medication changes, Labs and Tests ordered today are listed in the Patient Instructions below. Patient Instructions  Medication Instructions:  INCREASE- Entresto 97/103 mg by mouth twice a day  *If you need a refill on your cardiac medications before your next appointment, please call your pharmacy*   Lab Work: BMP and Pro BNP in 2 weeks  If you have labs (blood work) drawn today and your tests are completely normal, you will receive your results only by: [place] (if you have MyChart) OR A paper copy in the mail If you have any lab test that is abnormal or we need to change your treatment, we will call you to review the results.   Testing/Procedures: Your physician has requested that you have an echocardiogram in 3 Months. Echocardiography is a painless test that uses sound waves to create images of your heart. It provides your doctor with information about the size and shape of your heart and how well your heart's chambers and valves are working. This procedure takes approximately one hour. There are no restrictions for this procedure.   Follow-Up: At [facility], you and your health needs are our priority.  As part of our continuing mission to provide you with exceptional heart care, we have created designated Provider Care Teams.  These Care Teams include your primary Cardiologist (physician) and Advanced Practice Providers (APPs -  Physician Assistants and Nurse Practitioners) who all work together to provide you with the care you need, when you need it.  We  recommend signing up for the patient portal called "MyChart".  Sign up information is provided on this After Visit Summary.  MyChart is used to connect with patients for Virtual Visits (Telemedicine).  Patients are able to view lab/test results, encounter notes, upcoming appointments, etc.  Non-urgent messages can be sent to your provider as well.   To learn more about what you can do with MyChart, go to [email or web address].    Your next appointment:   4 month(s)  The format for your next appointment:   In Person  Provider:   You may see Shelva Majestic, MD  or one of the following Advanced Practice Providers on your designated Care Team:   Almyra Deforest, PA-C Fabian Sharp, [place] or  Roby Lofts, [place]     Signed, Shelva Majestic, MD  11/27/2020 3:11 PM    [facility] [address], [place], [place],   [address] Phone: [phone]

## 2020-11-24 ENCOUNTER — Telehealth: Payer: Self-pay

## 2020-11-24 NOTE — Telephone Encounter (Signed)
**Note De-Identified [name] Obfuscation** I started a Entresto PA through covermymeds. Key: [id number] (new Key # as I made mistake when sending the first PA request so that one has been archived).

## 2020-11-24 NOTE — Telephone Encounter (Signed)
Received notification that patient's Entresto 97-103 mg tablets were rejected and require PA.   [name] Last name: Nanez DOB: December 08, 1970  Thank you!

## 2020-11-27 ENCOUNTER — Encounter: Payer: Self-pay | Admitting: Cardiovascular Disease

## 2020-11-30 ENCOUNTER — Ambulatory Visit: Payer: Medicaid Other | Admitting: Cardiovascular Disease

## 2020-11-30 NOTE — Telephone Encounter (Signed)
Received notification that Delene Loll was approved from 11/24/2020-09/05/2021.  Will scan into chart for reference.  Thanks!

## 2020-12-10 ENCOUNTER — Other Ambulatory Visit: Payer: Self-pay | Admitting: Physician Assistant

## 2020-12-18 ENCOUNTER — Other Ambulatory Visit: Payer: Self-pay

## 2020-12-18 ENCOUNTER — Ambulatory Visit (HOSPITAL_COMMUNITY): Payer: Medicaid Other | Attending: Cardiology

## 2020-12-18 DIAGNOSIS — I428 Other cardiomyopathies: Secondary | ICD-10-CM | POA: Diagnosis not present

## 2020-12-18 LAB — ECHOCARDIOGRAM COMPLETE
Area-P 1/2: 2.51 cm2
S' Lateral: 3.5 cm

## 2020-12-25 ENCOUNTER — Encounter: Payer: Self-pay | Admitting: Internal Medicine

## 2020-12-25 ENCOUNTER — Ambulatory Visit (INDEPENDENT_AMBULATORY_CARE_PROVIDER_SITE_OTHER): Payer: Medicaid Other | Admitting: Internal Medicine

## 2020-12-25 ENCOUNTER — Other Ambulatory Visit: Payer: Self-pay

## 2020-12-25 DIAGNOSIS — I428 Other cardiomyopathies: Secondary | ICD-10-CM

## 2020-12-25 DIAGNOSIS — F17210 Nicotine dependence, cigarettes, uncomplicated: Secondary | ICD-10-CM

## 2020-12-25 DIAGNOSIS — J449 Chronic obstructive pulmonary disease, unspecified: Secondary | ICD-10-CM

## 2020-12-25 DIAGNOSIS — J4540 Moderate persistent asthma, uncomplicated: Secondary | ICD-10-CM | POA: Diagnosis not present

## 2020-12-25 LAB — PULMONARY FUNCTION TEST
DL/VA % pred: 59 %
DL/VA: 2.59 ml/min/mmHg/L
DLCO cor % pred: 51 %
DLCO cor: 10.52 ml/min/mmHg
DLCO unc % pred: 51 %
DLCO unc: 10.52 ml/min/mmHg
FEF 25-75 Post: 0.68 L/sec
FEF 25-75 Pre: 0.69 L/sec
FEF2575-%Change-Post: 0 %
FEF2575-%Pred-Post: 28 %
FEF2575-%Pred-Pre: 28 %
FEV1-%Change-Post: -2 %
FEV1-%Pred-Post: 57 %
FEV1-%Pred-Pre: 58 %
FEV1-Post: 1.3 L
FEV1-Pre: 1.33 L
FEV1FVC-%Change-Post: -12 %
FEV1FVC-%Pred-Pre: 77 %
FEV6-%Change-Post: 8 %
FEV6-%Pred-Post: 82 %
FEV6-%Pred-Pre: 75 %
FEV6-Post: 2.24 L
FEV6-Pre: 2.06 L
FEV6FVC-%Change-Post: -2 %
FEV6FVC-%Pred-Post: 98 %
FEV6FVC-%Pred-Pre: 100 %
FVC-%Change-Post: 11 %
FVC-%Pred-Post: 83 %
FVC-%Pred-Pre: 75 %
FVC-Post: 2.35 L
FVC-Pre: 2.11 L
Post FEV1/FVC ratio: 55 %
Post FEV6/FVC ratio: 95 %
Pre FEV1/FVC ratio: 63 %
Pre FEV6/FVC Ratio: 98 %
RV % pred: 138 %
RV: 2.38 L
TLC % pred: 101 %
TLC: 4.97 L

## 2020-12-25 MED ORDER — TRELEGY ELLIPTA 100-62.5-25 MCG/INH IN AEPB: INHALATION_SPRAY | RESPIRATORY_TRACT | 11 refills | Status: DC

## 2020-12-25 MED ORDER — TRELEGY ELLIPTA 100-62.5-25 MCG/INH IN AEPB: 1.0000 | INHALATION_SPRAY | Freq: Every day | RESPIRATORY_TRACT | 0 refills | Status: DC

## 2020-12-25 NOTE — Progress Notes (Signed)
[name], female    DOB: Feb 24, 1971,   MRN: [id number]   Brief patient profile:   50yobf active smoker intermittent asthma as  child resolved by [facility]  referred to pulmonary clinic 11/13/2020 by  Tereasa Coop, PA for asthma.  No problem with IUP x 4 last one 1995 then 2003 onset variable doe rx intermittent saba but persistent rx 2010> Javid rec dulera  100 2 bid seemed to help initially since 2020 not working as well.  Worse breathing and coughing since covid July 16th  2022    History of Present Illness  11/13/2020  Pulmonary/ 1st office eval/Gara Kincade  Chief Complaint  Patient presents with   Consult    Asthma  Dyspnea:  treadmill about 30 min 3 x weekly at home at [place] ?  But flat / vaccuuming is the challenge as are steps/ says 2 aisles at [employer] and has stop Cough: worse with walking ?  Worse since  on enteresto, worse in am / non productive Sleep: 45 degrees x years / losts of pillows SABA use: last used saba 1.5 h prio uses avg 4 x daily   COPD  GOLD ? / active smoker Onset around 2010  - Active smoker - 11/13/2020  After extensive coaching inhaler device,  effectiveness =    75% > resume duler 200 2bid and return for pfts/ req priors from Dr Freda Munro  - Allergy profile 11/13/2020 >  Eos 0.2 /  IgE  pending Rec Plan A = Automatic = Always=    Dulera 200 Take 2 puffs first thing in am and then another 2 puffs about 12 hours later.  Work on inhaler technique:   Plan B = Backup (to supplement plan A, not to replace it) Only use your albuterol inhaler as a rescue medication  Plan C = Crisis (instead of Plan B but only if Plan B stops working) - only use your albuterol nebulizer if you first try Plan B  Prednisone 10 mg take  4 each am x 2 days,   2 each am x 2 days,  1 each am x 2 days and stop  The key is to stop smoking completely before smoking completely stops you! Sign for Dr Marcelle Smiling  PFTs Please schedule a follow up office visit in 6-8 weeks,  with PFTs on  return.    12/25/2020  f/u ov/Shann Lewellyn re: copd 2  maint on dulera 200 2bid   Chief Complaint  Patient presents with   Follow-up    PFT's done today. Breathing is about the same since last visit. She has been wheezing some and states she has had upper back pain x 2 days. She has cough with clear sputum. She is using her albuterol inhaler 4 x per day and rarely uses neb.    Dyspnea:  treadmill x 15 s stopping flat 83mh Cough: some am clear mucus  Sleeping: 45 degrees with pillows  SABA use: four times daily  02: none Covid status:   never vaccinated  - had  infected twice already   No obvious day to day or daytime variability or assoc excess/ purulent sputum or mucus plugs or hemoptysis or cp or chest tightness, subjective wheeze or overt sinus or hb symptoms.   Sleeping  without nocturnal  or early am exacerbation  of respiratory  c/o's or need for noct saba. Also denies any obvious fluctuation of symptoms with weather or environmental changes or other aggravating or alleviating factors except as  outlined above   No unusual exposure hx or h/o childhood pna/ asthma or knowledge of premature birth.  Current Allergies, Complete Past Medical History, Past Surgical History, Family History, and Social History were reviewed in [employer] record.  ROS  The following are not active complaints unless bolded Hoarseness, sore throat, dysphagia, dental problems, itching, sneezing,  nasal congestion or discharge of excess mucus or purulent secretions, ear ache,   fever, chills, sweats, unintended wt loss or wt gain, classically pleuritic or exertional cp,  orthopnea pnd or arm/hand swelling  or leg swelling, presyncope, palpitations, abdominal pain, anorexia, nausea, vomiting, diarrhea  or change in bowel habits or change in bladder habits, change in stools or change in urine, dysuria, hematuria,  rash, arthralgias, visual complaints, headache, numbness, weakness or ataxia or problems  with walking or coordination,  change in mood or  memory.        Current Meds  Medication Sig   acetaminophen (TYLENOL) 500 MG tablet Take 1,000 mg by mouth every 8 (eight) hours as needed for mild pain or headache.   albuterol (PROVENTIL) (2.5 MG/3ML) 0.083% nebulizer solution Take 3 mLs (2.5 mg total) by nebulization every 6 (six) hours as needed for wheezing or shortness of breath.   albuterol (VENTOLIN HFA) 108 (90 Base) MCG/ACT inhaler Inhale 2 puffs into the lungs every 6 (six) hours as needed for wheezing or shortness of breath.   aspirin 81 MG chewable tablet Chew 81 mg by mouth daily.   atorvastatin (LIPITOR) 80 MG tablet Take 1 tablet (80 mg total) by mouth daily at 6 PM.   [employer] D3 5000 units capsule Take 5,000 Units by mouth daily.   furosemide (LASIX) 40 MG tablet Take 1 tablet (40 mg total) by mouth daily.   metoprolol succinate (TOPROL-XL) 100 MG 24 hr tablet TAKE 1 TABLET BY MOUTH EVERY DAY   mometasone-formoterol (DULERA) 200-5 MCG/ACT AERO Inhale 2 puffs into the lungs 2 (two) times daily.   nitroGLYCERIN (NITROSTAT) 0.4 MG SL tablet Place 1 tablet (0.4 mg total) under the tongue every 5 (five) minutes as needed for chest pain.   sacubitril-valsartan (ENTRESTO) 97-103 MG Take 1 tablet by mouth 2 (two) times daily.   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   NECK :  without JVD/Nodes/TM/ nl carotid upstrokes bilaterally   LUNGS: no acc muscle use,  Min barrel  contour chest wall with bilateral  slightly decreased bs s audible wheeze and  without cough on insp or exp maneuvers and min  Hyperresonant  to  percussion bilaterally     CV:  RRR  no s3 or murmur or increase in P2, and no edema   ABD:  soft and nontender with pos end  insp Hoover's  in the supine position. No bruits or organomegaly appreciated, bowel sounds nl  MS:   Nl gait/  ext warm without deformities, calf tenderness, cyanosis or clubbing No obvious joint restrictions   SKIN: warm and dry without lesions    NEURO:  alert, approp, nl sensorium with  no motor or cerebellar deficits apparent.               Assessment

## 2020-12-25 NOTE — Progress Notes (Signed)
Full PFT completed today ? ?

## 2020-12-25 NOTE — Assessment & Plan Note (Signed)
Onset around 2010  - Active smoker - 11/13/2020  After extensive coaching inhaler device,  effectiveness =    75% > resume duler 200 2bid and return for pfts/ req priors from Dr Freda Munro  - Allergy profile 11/13/2020 >  Eos 0.2 /  IgE  40  -PFT's  12/25/2020  FEV1 1.33 (58 % ) ratio 0.63  p 0 % improvement from saba p dulera 200 prior to study with DLCO  10.52 (51%) corrects to 2.59 (58 %)  for alv volume and FV curve classic concave curvature  - 12/25/2020  After extensive coaching inhaler device,  effectiveness = 90% dpi > change to trelegy 100      Group D in terms of symptom/risk and laba/lama/ICS  therefore appropriate rx at this point >>>  trelegy 100 each am and approp saba  Re SABA :  I spent extra time with pt today reviewing appropriate use of albuterol for prn use on exertion with the following points: 1) saba is for relief of sob that does not improve by walking a slower pace or resting but rather if the pt does not improve after trying this first. 2) If the pt is convinced, as many are, that saba helps recover from activity faster then it's easy to tell if this is the case by re-challenging : ie stop, take the inhaler, then p 5 minutes try the exact same activity (intensity of workload) that just caused the symptoms and see if they are substantially diminished or not after saba 3) if there is an activity that reproducibly causes the symptoms, try the saba 15 min before the activity on alternate days   If in fact the saba really does help, then fine to continue to use it prn but advised may need to look closer at the maintenance regimen being used to achieve better control of airways disease with exertion.

## 2020-12-25 NOTE — Patient Instructions (Signed)
Plan A = Automatic = Always=    Trelegy 469 one click each am  Plan B = Backup (to supplement plan A, not to replace it) Only use your albuterol inhaler as a rescue medication to be used if you can't catch your breath by resting or doing a relaxed purse lip breathing pattern.  - The less you use it, the better it will work when you need it. - Ok to use the inhaler up to 2 puffs  every 4 hours if you must but call for appointment if use goes up over your usual need - Don't leave home without it !!  (think of it like the spare tire for your car)   Plan C = Crisis (instead of Plan B but only if Plan B stops working) - only use your albuterol nebulizer if you first try Plan B and it fails to help > ok to use the nebulizer up to every 4 hours but if start needing it regularly call for immediate appointment  The key is to stop smoking completely before smoking completely stops you!    Please schedule a follow up office visit in 6 weeks, call sooner if needed

## 2020-12-25 NOTE — Assessment & Plan Note (Addendum)
Counseled re importance of smoking cessation but did not meet time criteria for separate billing           Each maintenance medication was reviewed in detail including emphasizing most importantly the difference between maintenance and prns and under what circumstances the prns are to be triggered using an action plan format where appropriate.  Total time for H and P, chart review, counseling, reviewing dpi/hfa device(s) and generating customized AVS unique to this office visit / same day charting = 31 min

## 2021-01-03 ENCOUNTER — Ambulatory Visit (HOSPITAL_COMMUNITY): Payer: Medicaid Other | Attending: Cardiology

## 2021-01-03 ENCOUNTER — Other Ambulatory Visit: Payer: Self-pay

## 2021-01-03 DIAGNOSIS — I428 Other cardiomyopathies: Secondary | ICD-10-CM | POA: Diagnosis not present

## 2021-01-03 MED ORDER — PERFLUTREN LIPID MICROSPHERE
1.0000 mL | INTRAVENOUS | Status: AC | PRN
  Administered 2021-01-03: 3 mL via INTRAVENOUS

## 2021-01-04 LAB — ECHOCARDIOGRAM LIMITED
Area-P 1/2: 2.65 cm2
MV M vel: 4.3 m/s
MV Peak grad: 74 mmHg
S' Lateral: 3.2 cm

## 2021-01-10 ENCOUNTER — Other Ambulatory Visit: Payer: Self-pay | Admitting: Physician Assistant

## 2021-01-29 ENCOUNTER — Other Ambulatory Visit: Payer: Self-pay | Admitting: Physician Assistant

## 2021-01-29 DIAGNOSIS — I150 Renovascular hypertension: Secondary | ICD-10-CM

## 2021-01-29 DIAGNOSIS — Z79899 Other long term (current) drug therapy: Secondary | ICD-10-CM

## 2021-01-29 DIAGNOSIS — I10 Essential (primary) hypertension: Secondary | ICD-10-CM

## 2021-02-05 ENCOUNTER — Ambulatory Visit (INDEPENDENT_AMBULATORY_CARE_PROVIDER_SITE_OTHER): Payer: Medicaid Other | Admitting: Internal Medicine

## 2021-02-05 ENCOUNTER — Other Ambulatory Visit: Payer: Self-pay

## 2021-02-05 ENCOUNTER — Encounter: Payer: Self-pay | Admitting: Internal Medicine

## 2021-02-05 DIAGNOSIS — J449 Chronic obstructive pulmonary disease, unspecified: Secondary | ICD-10-CM | POA: Diagnosis not present

## 2021-02-05 DIAGNOSIS — F17210 Nicotine dependence, cigarettes, uncomplicated: Secondary | ICD-10-CM

## 2021-02-05 NOTE — Patient Instructions (Signed)
No change in medicatons   Please schedule a follow up visit in 12  months but call sooner if needed

## 2021-02-05 NOTE — Assessment & Plan Note (Signed)
Onset around 2010  - Active smoker - 11/13/2020  After extensive coaching inhaler device,  effectiveness =    75% > resume duler 200 2bid and return for pfts/ req priors from Dr Freda Munro  - Allergy profile 11/13/2020 >  Eos 0.2 /  IgE  40  -PFT's  12/25/2020  FEV1 1.33 (58 % ) ratio 0.63  p 0 % improvement from saba p dulera 200 prior to study with DLCO  10.52 (51%) corrects to 2.59 (58 %)  for alv volume and FV curve classic concave curvature  - 12/25/2020  After extensive coaching inhaler device,  effectiveness = 90% dpi > changes to trelegy 100      Group D in terms of symptom/risk and laba/lama/ICS  therefore appropriate rx at this point >>>  Continue trelegy and prn saba

## 2021-02-05 NOTE — Assessment & Plan Note (Signed)
Counseled re importance of smoking cessation but did not meet time criteria for separate billing           Each maintenance medication was reviewed in detail including emphasizing most importantly the difference between maintenance and prns and under what circumstances the prns are to be triggered using an action plan format where appropriate.  Total time for H and P, chart review, counseling, reviewing dpi/hfa  device(s) and generating customized AVS unique to this office visit / same day charting =25 min

## 2021-02-05 NOTE — Progress Notes (Signed)
[name], female    DOB: 1971/03/15,   MRN: [id number]   Brief patient profile:   50yobf active smoker intermittent asthma as  child resolved by [facility]  referred to pulmonary clinic 11/13/2020 by  Tereasa Coop, PA for asthma.  No problem with IUP x 4 last one 1995 then 2003 onset variable doe rx intermittent saba but persistent rx 2010> Javid rec dulera  100 2 bid seemed to help initially since 2020 not working as well.  Worse breathing and coughing since covid July 16th  2022    History of Present Illness  11/13/2020  Pulmonary/ 1st office eval/[name]  Chief Complaint  Patient presents with   Consult    Asthma  Dyspnea:  treadmill about 30 min 3 x weekly at home at [place] ?  But flat / vaccuuming is the challenge as are steps/ says 2 aisles at [employer] and has stop Cough: worse with walking ?  Worse since  on enteresto, worse in am / non productive Sleep: 45 degrees x years / losts of pillows SABA use: last used saba 1.5 h prio uses avg 4 x daily   COPD  GOLD ? / active smoker Onset around 2010  - Active smoker - 11/13/2020  After extensive coaching inhaler device,  effectiveness =    75% > resume duler 200 2bid and return for pfts/ req priors from Dr Freda Munro  - Allergy profile 11/13/2020 >  Eos 0.2 /  IgE  pending Rec Plan A = Automatic = Always=    Dulera 200 Take 2 puffs first thing in am and then another 2 puffs about 12 hours later.  Work on inhaler technique:   Plan B = Backup (to supplement plan A, not to replace it) Only use your albuterol inhaler as a rescue medication  Plan C = Crisis (instead of Plan B but only if Plan B stops working) - only use your albuterol nebulizer if you first try Plan B  Prednisone 10 mg take  4 each am x 2 days,   2 each am x 2 days,  1 each am x 2 days and stop  The key is to stop smoking completely before smoking completely stops you! Sign for Dr Marcelle Smiling  PFTs Please schedule a follow up office visit in 6-8 weeks,  with PFTs on  return.    12/25/2020  f/u ov/[name] re: copd 2  maint on dulera 200 2bid   Chief Complaint  Patient presents with   Follow-up    PFT's done today. Breathing is about the same since last visit. She has been wheezing some and states she has had upper back pain x 2 days. She has cough with clear sputum. She is using her albuterol inhaler 4 x per day and rarely uses neb.    Dyspnea:  treadmill x 15 s stopping flat 42mh Cough: some am clear mucus  Sleeping: 45 degrees with pillows  SABA use: four times daily  02: none Covid status:   never vaccinated  - had  infected twice already  Rec Plan A = Automatic = Always=    Trelegy [id number] click each am Plan B = Backup (to supplement plan A, not to replace it) Only use your albuterol inhaler as a rescue medication Plan C = Crisis (instead of Plan B but only if Plan B stops working) - only use your albuterol nebulizer if you first try Plan B  The key is to stop smoking completely before  smoking completely stops you!   02/05/2021  f/u ov/[name] re: GOLD 2  maint on trelegy  / still smoking  Chief Complaint  Patient presents with   Follow-up    COPD   Dyspnea:  still doing treadmill up to 35 min x 3 days per week Cough: none  Sleeping: can lie flat/ one pillow  SABA use: none  02: none  Covid status:   never vax/ infected x 2    No obvious day to day or daytime variability or assoc excess/ purulent sputum or mucus plugs or hemoptysis or cp or chest tightness, subjective wheeze or overt sinus or hb symptoms.   Sleeping  without nocturnal  or early am exacerbation  of respiratory  c/o's or need for noct saba. Also denies any obvious fluctuation of symptoms with weather or environmental changes or other aggravating or alleviating factors except as outlined above   No unusual exposure hx or h/o childhood pna/ knowledge of premature birth.  Current Allergies, Complete Past Medical History, Past Surgical History, Family History, and Social History  were reviewed in [employer] record.  ROS  The following are not active complaints unless bolded Hoarseness, sore throat, dysphagia, dental problems, itching, sneezing,  nasal congestion or discharge of excess mucus or purulent secretions, ear ache,   fever, chills, sweats, unintended wt loss or wt gain, classically pleuritic or exertional cp,  orthopnea pnd or arm/hand swelling  or leg swelling, presyncope, palpitations, abdominal pain, anorexia, nausea, vomiting, diarrhea  or change in bowel habits or change in bladder habits, change in stools or change in urine, dysuria, hematuria,  rash, arthralgias, visual complaints, headache, numbness, weakness or ataxia or problems with walking or coordination,  change in mood or  memory.        Current Meds  Medication Sig   acetaminophen (TYLENOL) 500 MG tablet Take 1,000 mg by mouth every 8 (eight) hours as needed for mild pain or headache.   albuterol (PROVENTIL) (2.5 MG/3ML) 0.083% nebulizer solution Take 3 mLs (2.5 mg total) by nebulization every 6 (six) hours as needed for wheezing or shortness of breath.   albuterol (VENTOLIN HFA) 108 (90 Base) MCG/ACT inhaler Inhale 2 puffs into the lungs every 6 (six) hours as needed for wheezing or shortness of breath.   aspirin 81 MG chewable tablet Chew 81 mg by mouth daily.   atorvastatin (LIPITOR) 80 MG tablet Take 1 tablet (80 mg total) by mouth daily at 6 PM.   CVS D3 5000 units capsule Take 5,000 Units by mouth daily.   Fluticasone-Umeclidin-Vilant (TRELEGY ELLIPTA) 100-62.5-25 MCG/INH AEPB One click each am   Fluticasone-Umeclidin-Vilant (TRELEGY ELLIPTA) 100-62.5-25 MCG/INH AEPB Inhale 1 puff into the lungs daily.   furosemide (LASIX) 40 MG tablet TAKE 1 TABLET BY MOUTH EVERY DAY   metoprolol succinate (TOPROL-XL) 100 MG 24 hr tablet TAKE 1 TABLET BY MOUTH EVERY DAY   nitroGLYCERIN (NITROSTAT) 0.4 MG SL tablet Place 1 tablet (0.4 mg total) under the tongue every 5 (five) minutes as  needed for chest pain.   sacubitril-valsartan (ENTRESTO) 97-103 MG Take 1 tablet by mouth 2 (two) times daily.   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   NECK :  without JVD/Nodes/TM/ nl carotid upstrokes bilaterally   LUNGS: no acc muscle use,  Min barrel  contour chest wall with bilateral  slightly decreased bs s audible wheeze and  without cough on insp or exp maneuvers and min  Hyperresonant  to  percussion bilaterally     CV:  RRR  no s3 or murmur or increase in P2, and no edema   ABD:  soft and nontender with pos end  insp Hoover's  in the supine position. No bruits or organomegaly appreciated, bowel sounds nl  MS:   Nl gait/  ext warm without deformities, calf tenderness, cyanosis or clubbing No obvious joint restrictions   SKIN: warm and dry without lesions    NEURO:  alert, approp, nl sensorium with  no motor or cerebellar deficits apparent.                   Assessment

## 2021-04-20 ENCOUNTER — Other Ambulatory Visit: Payer: Self-pay | Admitting: Cardiovascular Disease

## 2021-05-18 ENCOUNTER — Other Ambulatory Visit: Payer: Self-pay | Admitting: Cardiovascular Disease

## 2021-06-06 ENCOUNTER — Telehealth: Payer: Self-pay | Admitting: Hematology and Oncology

## 2021-06-06 NOTE — Telephone Encounter (Signed)
Scheduled appt per 3/15 referral. Pt was already established with Dr. Lindi Adie. Called pt, no answer. Left msg with appt date and time.  ?

## 2021-06-20 NOTE — Progress Notes (Signed)
? ?Patient Care Team: ?Elinor Parkinson as PCP - General (Physician Assistant) ?Troy Sine, MD as PCP - Cardiology (Cardiology) ?Erroll Luna, MD as Consulting Physician (General Surgery) ?Nicholas Lose, MD as Consulting Physician (Hematology and Oncology) ?Kyung Rudd, MD as Consulting Physician (Radiation Oncology) ?Gardenia Phlegm, NP as Nurse Practitioner (Hematology and Oncology) ? ?DIAGNOSIS:  ?Encounter Diagnosis  ?Name Primary?  ? Malignant neoplasm of overlapping sites of right breast in female, estrogen receptor positive ([place])   ? ? ?SUMMARY OF ONCOLOGIC HISTORY: ?Oncology History  ?Malignant neoplasm of overlapping sites of right breast in female, estrogen receptor positive ([place])  ?07/18/2016 Initial Diagnosis  ? Palpable right breast masses for 1 year; 2 adjacent spiculated masses by ultrasound at 1:00 subareolar 4.2 cm mass; 3 satellite nodules at 9:00; 2 abnormal lymph nodes; biopsy of the 2 masses and the lymph node showed similar-appearing grade 3 IDC ER 100% PR 95% Ki-67 20-30%, HER-2 negative ratio 1.57; T2 N1 stage IIB (AJCC 8) ?  ?08/09/2016 - 10/11/2016 Neo-Adjuvant Chemotherapy  ? Taxotere and Cytoxan every 3 weeks ?4 cycles ?  ?08/22/2016 Genetic Testing  ? 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  The Hereditary Gene Panel offered by [facility] includes sequencing and/or deletion duplication testing of the following 46 genes: APC, ATM, AXIN2, BARD1, BMPR1A, BRCA1, BRCA2, BRIP1, CDH1, CDKN2A (p14ARF), CDKN2A (p16INK4a), CHEK2, CTNNA1, DICER1, EPCAM (Deletion/duplication testing only), GREM1 (promoter region deletion/duplication testing only), KIT, MEN1, MLH1, MSH2, MSH3, MSH6, MUTYH, NBN, NF1, NHTL1, PALB2, PDGFRA, PMS2, POLD1, POLE, PTEN, RAD50, RAD51C, RAD51D, SDHB, SDHC, SDHD, SMAD4, SMARCA4. STK11, TP53, TSC1, TSC2, and VHL.  The following genes were evaluated for sequence changes only: SDHA and HOXB13 c.251G>A variant only.  The report date is  Aug 22, 2016. ? ?  ?01/30/2017 Surgery  ? Right mastectomy: IDC grade 3, 7.2 cm, high-grade DCIS, lymphovascular invasion present including dermal lymphatics, perineural invasion present, margins negative, 4/5 lymph nodes positive with extracapsular extension, ER 100%, PR 100%, HER-2 negative ratio 1.41 ?  ?05/29/2017 - 07/18/2017 Radiation Therapy  ? Adjuvant radiation therapy ?  ?04/13/2018 -  Anti-estrogen oral therapy  ? Initially prescribed tamoxifen but apparently she did not pick it up.  Because she is menopausal we are starting anastrozole 1 mg daily starting 04/13/2018 ?  ? ? ?CHIEF COMPLIANT:  Follow-up breast cancer ? ?INTERVAL HISTORY: [name] is a 52-year with above-mentioned history of breast cancer who underwent neoadjuvant chemotherapy followed by mastectomy and radiation. She presents to the clinic today for a follow-up. She complain of hot flashes from taking the anastrozole and vaginal dryness.  She stopped taking anastrozole about 3 months ago when she ran out of the prescription.  She tells me that the hot flashes have improved.  Previously she was on Effexor which was helping her hot flashes but she stopped that as well and therefore the hot flashes have come back. ?On labs she was noted to have elevated alkaline phosphatase and elevated calcium levels.  She was sent to [place] for evaluation of that. ? ? ?ALLERGIES:  is allergic to compazine [prochlorperazine] and ondansetron hcl. ? ?MEDICATIONS:  ?Current Outpatient Medications  ?Medication Sig Dispense Refill  ? letrozole (FEMARA) 2.5 MG tablet Take 1 tablet (2.5 mg total) by mouth daily. 90 tablet 3  ? venlafaxine XR (EFFEXOR-XR) 37.5 MG 24 hr capsule Take 1 capsule (37.5 mg total) by mouth daily with breakfast. 90 capsule 3  ? acetaminophen (TYLENOL) 500 MG tablet Take 1,000  mg by mouth every 8 (eight) hours as needed for mild pain or headache.    ? albuterol (PROVENTIL) (2.5 MG/3ML) 0.083% nebulizer solution Take 3 mLs (2.5 mg total) by  nebulization every 6 (six) hours as needed for wheezing or shortness of breath. 75 mL 12  ? albuterol (VENTOLIN HFA) 108 (90 Base) MCG/ACT inhaler Inhale 2 puffs into the lungs every 6 (six) hours as needed for wheezing or shortness of breath.    ? aspirin 81 MG chewable tablet Chew 81 mg by mouth daily.    ? atorvastatin (LIPITOR) 80 MG tablet Take 1 tablet (80 mg total) by mouth daily at 6 PM. 90 tablet 2  ? [facility] D3 5000 units capsule Take 5,000 Units by mouth daily.  11  ? Fluticasone-Umeclidin-Vilant (TRELEGY ELLIPTA) 100-62.5-25 MCG/INH AEPB One click each am 1 each 11  ? Fluticasone-Umeclidin-Vilant (TRELEGY ELLIPTA) 100-62.5-25 MCG/INH AEPB Inhale 1 puff into the lungs daily. 14 each 0  ? furosemide (LASIX) 40 MG tablet TAKE 1 TABLET BY MOUTH EVERY DAY 90 tablet 1  ? metoprolol succinate (TOPROL-XL) 100 MG 24 hr tablet TAKE 1 TABLET BY MOUTH EVERY DAY 30 tablet 0  ? nitroGLYCERIN (NITROSTAT) 0.4 MG SL tablet Place 1 tablet (0.4 mg total) under the tongue every 5 (five) minutes as needed for chest pain. 25 tablet 3  ? sacubitril-valsartan (ENTRESTO) 97-103 MG Take 1 tablet by mouth 2 (two) times daily. 60 tablet 11  ? spironolactone (ALDACTONE) 25 MG tablet TAKE 1 TABLET BY MOUTH EVERY DAY 90 tablet 3  ? ?No current facility-administered medications for this visit.  ? ? ?PHYSICAL EXAMINATION: ?ECOG PERFORMANCE STATUS: 1 - Symptomatic but completely ambulatory ? ?Vitals:  ? 06/21/21 1137  ?BP: 119/72  ?Pulse: 85  ?Resp: 18  ?Temp: (!) 97.3 ?F (36.3 ?C)  ?SpO2: 95%  ? ?Filed Weights  ? 06/21/21 1137  ?Weight: 174 lb (78.9 kg)  ? ? ? ?LABORATORY DATA:  ?I have reviewed the data as listed ? ?  Latest Ref Rng & Units 11/13/2020  ? 11:25 AM 08/25/2020  ? 11:24 AM 07/02/2019  ? 12:15 PM  ?CMP  ?Glucose 70 - 99 mg/dL 78   91   91    ?BUN 6 - 23 mg/dL _0 ?Creatinine 0.40 - 1.20 mg/dL 0.94   0.86   0.79    ?Sodium 135 - 145 mEq/L 142   139   141    ?Potassium 3.5 - 5.1 mEq/L 3.8   4.3   4.1    ?Chloride 96 - 112  mEq/L 108   105   106    ?CO2 19 - 32 mEq/L _1 ?Calcium 8.4 - 10.5 mg/dL 10.2   10.6   10.7    ?Total Protein 6.0 - 8.5 g/dL  6.5   6.6    ?Total Bilirubin 0.0 - 1.2 mg/dL  0.4   0.2    ?Alkaline Phos 44 - 121 IU/L  220   212    ?AST 0 - 40 IU/L  21   18    ?ALT 0 - 32 IU/L  29   13    ? ? ?Lab Results  ?Component Value Date  ? WBC 8.7 06/21/2021  ? HGB 14.9 06/21/2021  ? HCT 44.3 06/21/2021  ? MCV 90.2 06/21/2021  ? PLT 251 06/21/2021  ? NEUTROABS 5.0 06/21/2021  ? ? ?ASSESSMENT & PLAN:  ?  Malignant neoplasm of overlapping sites of right breast in female, estrogen receptor positive ([place]) ?07/18/2016: Palpable right breast masses for 1 year; 2 adjacent spiculated masses by ultrasound at 1:00 subareolar 4.2 cm mass; 3 satellite nodules at 9:00; 2 abnormal lymph nodes; biopsy of the 2 masses and the lymph node showed similar-appearing grade 3 IDC ER 100% PR 95% Ki-67 20-30%, HER-2 negative ratio 1.57; T2 N1 stage IIB (AJCC 8) ?Right treatment for lymphoma ?  ?Treatment summary: ?1. Neoadjuvant chemotherapy with Taxotere and Cytoxan every 3 weeks ?4  completed July 2018 ?2. 01/30/2017:Right mastectomy: IDC grade 3, 7.2 cm, high-grade DCIS, lymphovascular invasion present including dermal lymphatics, perineural invasion present, margins negative, 4/5 lymph nodes positive with extracapsular extension, ER 100%, PR 100%, HER-2 negative ratio 1.41 ?3.Adjuvant radiation therapy 05/29/2017-07/18/2017 ?5. Followed by antiestrogen therapy with anastrozole started 04/13/2018 ?--------------------------------------------------------------------- ?Current treatment: Anastrozole 1 mg daily to start 04/13/2018, patient had not had menstrual cycle since 2010 ?  ?Anastrozole toxicities: ?1.  Hot flashes: She was previously on Effexor but has not been taking it.  I will send another prescription for Effexor. ?  ?Breast cancer surveillance: ?1.  Breast exam 06/21/2021: Benign ?2.  Mammogram: 05/13/2018: No evidence of malignancy,  breast density category B.  She needs to do annual mammograms On the left breast ? ?Elevated alkaline phosphatase: Along with elevated calcium levels: I would like to obtain a bone scan, SPEP  for further

## 2021-06-21 ENCOUNTER — Inpatient Hospital Stay: Payer: Medicaid Other | Attending: Hematology and Oncology | Admitting: Hematology and Oncology

## 2021-06-21 ENCOUNTER — Other Ambulatory Visit: Payer: Self-pay

## 2021-06-21 ENCOUNTER — Other Ambulatory Visit: Payer: Self-pay | Admitting: Hematology and Oncology

## 2021-06-21 ENCOUNTER — Inpatient Hospital Stay: Payer: Medicaid Other

## 2021-06-21 DIAGNOSIS — Z170 Estrogen receptor positive status [ER+]: Secondary | ICD-10-CM | POA: Diagnosis not present

## 2021-06-21 DIAGNOSIS — C50811 Malignant neoplasm of overlapping sites of right female breast: Secondary | ICD-10-CM

## 2021-06-21 DIAGNOSIS — R748 Abnormal levels of other serum enzymes: Secondary | ICD-10-CM | POA: Diagnosis not present

## 2021-06-21 LAB — CBC WITH DIFFERENTIAL (CANCER CENTER ONLY)
Abs Immature Granulocytes: 0.02 10*3/uL (ref 0.00–0.07)
Basophils Absolute: 0.1 10*3/uL (ref 0.0–0.1)
Basophils Relative: 1 %
Eosinophils Absolute: 0.1 10*3/uL (ref 0.0–0.5)
Eosinophils Relative: 1 %
HCT: 44.3 % (ref 36.0–46.0)
Hemoglobin: 14.9 g/dL (ref 12.0–15.0)
Immature Granulocytes: 0 %
Lymphocytes Relative: 34 %
Lymphs Abs: 2.9 10*3/uL (ref 0.7–4.0)
MCH: 30.3 pg (ref 26.0–34.0)
MCHC: 33.6 g/dL (ref 30.0–36.0)
MCV: 90.2 fL (ref 80.0–100.0)
Monocytes Absolute: 0.5 10*3/uL (ref 0.1–1.0)
Monocytes Relative: 6 %
Neutro Abs: 5 10*3/uL (ref 1.7–7.7)
Neutrophils Relative %: 58 %
Platelet Count: 251 10*3/uL (ref 150–400)
RBC: 4.91 MIL/uL (ref 3.87–5.11)
RDW: 13.5 % (ref 11.5–15.5)
WBC Count: 8.7 10*3/uL (ref 4.0–10.5)
nRBC: 0 % (ref 0.0–0.2)

## 2021-06-21 LAB — CMP (CANCER CENTER ONLY)
ALT: 20 U/L (ref 0–44)
AST: 23 U/L (ref 15–41)
Albumin: 4.4 g/dL (ref 3.5–5.0)
Alkaline Phosphatase: 182 U/L — ABNORMAL HIGH (ref 38–126)
Anion gap: 7 (ref 5–15)
BUN: 8 mg/dL (ref 6–20)
CO2: 23 mmol/L (ref 22–32)
Calcium: 10.5 mg/dL — ABNORMAL HIGH (ref 8.9–10.3)
Chloride: 110 mmol/L (ref 98–111)
Creatinine: 0.86 mg/dL (ref 0.44–1.00)
GFR, Estimated: >60 mL/min (ref >60)
Glucose, Bld: 92 mg/dL (ref 70–99)
Potassium: 3.8 mmol/L (ref 3.5–5.1)
Sodium: 140 mmol/L (ref 135–145)
Total Bilirubin: 0.6 mg/dL (ref 0.3–1.2)
Total Protein: 7.2 g/dL (ref 6.5–8.1)

## 2021-06-21 MED ORDER — LETROZOLE 2.5 MG PO TABS: 2.5000 mg | ORAL_TABLET | Freq: Every day | ORAL | 3 refills | Status: DC

## 2021-06-21 MED ORDER — VENLAFAXINE HCL ER 37.5 MG PO CP24: 37.5000 mg | ORAL_CAPSULE | Freq: Every day | ORAL | 3 refills | Status: AC

## 2021-06-21 NOTE — Assessment & Plan Note (Addendum)
07/18/2016: Palpable right breast masses for 1 year; 2 adjacent spiculated masses by ultrasound at 1:00 subareolar 4.2 cm mass; 3 satellite nodules at 9:00; 2 abnormal lymph nodes; biopsy of the 2 masses and the lymph node showed similar-appearing grade 3 IDC ER 100% PR 95% Ki-67 20-30%, HER-2 negative ratio 1.57; T2 N1 stage IIB (AJCC 8) ?Right treatment for lymphoma ?? ?Treatment summary: ?1. Neoadjuvant chemotherapy with Taxotere?and Cytoxan every 3 weeks??4??completed July 2018 ?2.?01/30/2017:Right mastectomy: IDC grade 3, 7.2 cm, high-grade DCIS, lymphovascular invasion present including dermal lymphatics, perineural invasion present, margins negative, 4/5 lymph nodes positive with extracapsular extension, ER 100%, PR 100%, HER-2 negative ratio 1.41 ?3.Adjuvant radiation therapy?05/29/2017-07/18/2017 ?5.?Followed by antiestrogen therapy with anastrozole started 04/13/2018 ?--------------------------------------------------------------------- ?Current treatment:?Anastrozole 1 mg daily to start 04/13/2018, patient had not had menstrual cycle since 2010 ?? ?Anastrozole toxicities: ?1.  Hot flashes: She was previously on Effexor but has not been taking it.  I will send another prescription for Effexor. ?? ?Breast cancer surveillance: ?1.??Breast exam 06/21/2021: Benign ?2.??Mammogram: 05/13/2018: No evidence of malignancy, breast density category B.  She needs to do annual mammograms On the left breast ? ?Elevated alkaline phosphatase: Along with elevated calcium levels: I would like to obtain a bone scan, SPEP  for further evaluation.  We will also get a PTH and PTHRP and TSH  ?? ?Return to clinic in 3 weeks for follow-up ?

## 2021-06-22 LAB — PARATHYROID HORMONE, INTACT (NO CA): PTH: 55 pg/mL (ref 15–65)

## 2021-06-29 LAB — PTH-RELATED PEPTIDE: PTH-related peptide: <2 pmol/L

## 2021-06-29 NOTE — Progress Notes (Signed)
? ?Patient Care Team: ?Elinor Parkinson as PCP - General (Physician Assistant) ?Troy Sine, MD as PCP - Cardiology (Cardiology) ?Erroll Luna, MD as Consulting Physician (General Surgery) ?Nicholas Lose, MD as Consulting Physician (Hematology and Oncology) ?Kyung Rudd, MD as Consulting Physician (Radiation Oncology) ?Gardenia Phlegm, NP as Nurse Practitioner (Hematology and Oncology) ? ?DIAGNOSIS:  ?Encounter Diagnosis  ?Name Primary?  ? Malignant neoplasm of overlapping sites of right breast in female, estrogen receptor positive ([place])   ? ? ?SUMMARY OF ONCOLOGIC HISTORY: ?Oncology History  ?Malignant neoplasm of overlapping sites of right breast in female, estrogen receptor positive ([place])  ?07/18/2016 Initial Diagnosis  ? Palpable right breast masses for 1 year; 2 adjacent spiculated masses by ultrasound at 1:00 subareolar 4.2 cm mass; 3 satellite nodules at 9:00; 2 abnormal lymph nodes; biopsy of the 2 masses and the lymph node showed similar-appearing grade 3 IDC ER 100% PR 95% Ki-67 20-30%, HER-2 negative ratio 1.57; T2 N1 stage IIB (AJCC 8) ? ?  ?08/09/2016 - 10/11/2016 Neo-Adjuvant Chemotherapy  ? Taxotere and Cytoxan every 3 weeks ?4 cycles ? ?  ?08/22/2016 Genetic Testing  ? 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  The Hereditary Gene Panel offered by [facility] includes sequencing and/or deletion duplication testing of the following 46 genes: APC, ATM, AXIN2, BARD1, BMPR1A, BRCA1, BRCA2, BRIP1, CDH1, CDKN2A (p14ARF), CDKN2A (p16INK4a), CHEK2, CTNNA1, DICER1, EPCAM (Deletion/duplication testing only), GREM1 (promoter region deletion/duplication testing only), KIT, MEN1, MLH1, MSH2, MSH3, MSH6, MUTYH, NBN, NF1, NHTL1, PALB2, PDGFRA, PMS2, POLD1, POLE, PTEN, RAD50, RAD51C, RAD51D, SDHB, SDHC, SDHD, SMAD4, SMARCA4. STK11, TP53, TSC1, TSC2, and VHL.  The following genes were evaluated for sequence changes only: SDHA and HOXB13 c.251G>A variant only.  The report date  is Aug 22, 2016. ? ? ?  ?01/30/2017 Surgery  ? Right mastectomy: IDC grade 3, 7.2 cm, high-grade DCIS, lymphovascular invasion present including dermal lymphatics, perineural invasion present, margins negative, 4/5 lymph nodes positive with extracapsular extension, ER 100%, PR 100%, HER-2 negative ratio 1.41 ? ?  ?05/29/2017 - 07/18/2017 Radiation Therapy  ? Adjuvant radiation therapy ? ?  ?04/13/2018 -  Anti-estrogen oral therapy  ? Initially prescribed tamoxifen but apparently she did not pick it up.  Because she is menopausal we are starting anastrozole 1 mg daily starting 04/13/2018 ? ?  ? ? ?CHIEF COMPLIANT: Follow-up scans and blood work ? ?INTERVAL HISTORY: [name] is a  51 year old  She presents to the clinic today for a follow-up to review scans and blood work.  She was noted to have hypercalcemia and bone scan and blood work was performed.  She is here today to discuss results.  She reports no new problems or concerns.  She is tolerating anastrozole extremely well. ? ? ?ALLERGIES:  is allergic to compazine [prochlorperazine] and ondansetron hcl. ? ?MEDICATIONS:  ?Current Outpatient Medications  ?Medication Sig Dispense Refill  ? acetaminophen (TYLENOL) 500 MG tablet Take 1,000 mg by mouth every 8 (eight) hours as needed for mild pain or headache.    ? albuterol (PROVENTIL) (2.5 MG/3ML) 0.083% nebulizer solution Take 3 mLs (2.5 mg total) by nebulization every 6 (six) hours as needed for wheezing or shortness of breath. 75 mL 12  ? albuterol (VENTOLIN HFA) 108 (90 Base) MCG/ACT inhaler Inhale 2 puffs into the lungs every 6 (six) hours as needed for wheezing or shortness of breath.    ? aspirin 81 MG chewable tablet Chew 81 mg by mouth daily.    ?  atorvastatin (LIPITOR) 80 MG tablet Take 1 tablet (80 mg total) by mouth daily at 6 PM. 90 tablet 2  ? [facility] D3 5000 units capsule Take 5,000 Units by mouth daily.  11  ? Fluticasone-Umeclidin-Vilant (TRELEGY ELLIPTA) 100-62.5-25 MCG/INH AEPB One click each am 1  each 11  ? Fluticasone-Umeclidin-Vilant (TRELEGY ELLIPTA) 100-62.5-25 MCG/INH AEPB Inhale 1 puff into the lungs daily. 14 each 0  ? furosemide (LASIX) 40 MG tablet TAKE 1 TABLET BY MOUTH EVERY DAY 90 tablet 1  ? letrozole (FEMARA) 2.5 MG tablet Take 1 tablet (2.5 mg total) by mouth daily. 90 tablet 3  ? metoprolol succinate (TOPROL-XL) 100 MG 24 hr tablet Take 1 tablet (100 mg total) by mouth daily. Take with or immediately following a meal. Keep appointment in Aug 2023 for future refills 90 tablet 1  ? nitroGLYCERIN (NITROSTAT) 0.4 MG SL tablet Place 1 tablet (0.4 mg total) under the tongue every 5 (five) minutes as needed for chest pain. 25 tablet 3  ? sacubitril-valsartan (ENTRESTO) 97-103 MG Take 1 tablet by mouth 2 (two) times daily. 60 tablet 11  ? spironolactone (ALDACTONE) 25 MG tablet TAKE 1 TABLET BY MOUTH EVERY DAY 90 tablet 3  ? venlafaxine XR (EFFEXOR-XR) 37.5 MG 24 hr capsule Take 1 capsule (37.5 mg total) by mouth daily with breakfast. 90 capsule 3  ? ?No current facility-administered medications for this visit.  ? ? ?PHYSICAL EXAMINATION: ?ECOG PERFORMANCE STATUS: 1 - Symptomatic but completely ambulatory ? ?Vitals:  ? 07/13/21 1101  ?BP: 118/64  ?Pulse: 71  ?Resp: 18  ?Temp: 97.8 ?F (36.6 ?C)  ?SpO2: 98%  ? ?Filed Weights  ? 07/13/21 1101  ?Weight: 173 lb 1.6 oz (78.5 kg)  ? ?  ? ?LABORATORY DATA:  ?I have reviewed the data as listed ? ?  Latest Ref Rng & Units 06/21/2021  ? 12:27 PM 11/13/2020  ? 11:25 AM 08/25/2020  ? 11:24 AM  ?CMP  ?Glucose 70 - 99 mg/dL 92   78   91    ?BUN 6 - 20 mg/dL _0 ?Creatinine 0.44 - 1.00 mg/dL 0.86   0.94   0.86    ?Sodium 135 - 145 mmol/L 140   142   139    ?Potassium 3.5 - 5.1 mmol/L 3.8   3.8   4.3    ?Chloride 98 - 111 mmol/L 110   108   105    ?CO2 22 - 32 mmol/L _1 ?Calcium 8.9 - 10.3 mg/dL 10.5   10.2   10.6    ?Total Protein 6.5 - 8.1 g/dL 7.2    6.5    ?Total Bilirubin 0.3 - 1.2 mg/dL 0.6    0.4    ?Alkaline Phos 38 - 126 U/L 182    220     ?AST 15 - 41 U/L 23    21    ?ALT 0 - 44 U/L 20    29    ? ? ?Lab Results  ?Component Value Date  ? WBC 8.7 06/21/2021  ? HGB 14.9 06/21/2021  ? HCT 44.3 06/21/2021  ? MCV 90.2 06/21/2021  ? PLT 251 06/21/2021  ? NEUTROABS 5.0 06/21/2021  ? ? ?ASSESSMENT & PLAN:  ?Malignant neoplasm of overlapping sites of right breast in female, estrogen receptor positive ([place]) ?07/18/2016: Palpable right breast masses for 1 year; 2 adjacent spiculated masses by ultrasound at 1:00 subareolar 4.2  cm mass; 3 satellite nodules at 9:00; 2 abnormal lymph nodes; biopsy of the 2 masses and the lymph node showed similar-appearing grade 3 IDC ER 100% PR 95% Ki-67 20-30%, HER-2 negative ratio 1.57; T2 N1 stage IIB (AJCC 8) ?Right treatment for lymphoma ?  ?Treatment summary: ?1. Neoadjuvant chemotherapy with Taxotere and Cytoxan every 3 weeks ?4  completed July 2018 ?2. 01/30/2017:Right mastectomy: IDC grade 3, 7.2 cm, high-grade DCIS, lymphovascular invasion present including dermal lymphatics, perineural invasion present, margins negative, 4/5 lymph nodes positive with extracapsular extension, ER 100%, PR 100%, HER-2 negative ratio 1.41 ?3.Adjuvant radiation therapy 05/29/2017-07/18/2017 ?5. Followed by antiestrogen therapy with anastrozole started 04/13/2018 ?--------------------------------------------------------------------- ?Current treatment: Anastrozole 1 mg daily to start 04/13/2018, patient had not had menstrual cycle since 2010 ?  ?Anastrozole toxicities: ?1.  Hot flashes: She was previously on Effexor but has not been taking it.  I will send another prescription for Effexor. ?  ?Breast cancer surveillance: ?1.  Breast exam 06/21/2021: Benign ?2.  Mammogram: 05/13/2018: No evidence of malignancy, breast density category B.  She needs to do annual mammograms On the left breast ?  ?Elevated alkaline phosphatase: Along with elevated calcium levels: ?1.  Bone scan 07/11/2021: Benign no bone metastases ?2. PTH RP less than 2, calcium 10.5,  alkaline phosphatase 182, PTH: 55 ? ?Recommendation: Ultrasound of the parathyroid glands ?Return to clinic in 1 year for follow-up ? ? ? ?Orders Placed This Encounter  ?Procedures  ? US SOFT TISSUE HEA

## 2021-07-05 ENCOUNTER — Other Ambulatory Visit: Payer: Self-pay | Admitting: Cardiovascular Disease

## 2021-07-05 MED ORDER — METOPROLOL SUCCINATE ER 100 MG PO TB24: 100.0000 mg | ORAL_TABLET | Freq: Every day | ORAL | 1 refills | Status: DC

## 2021-07-05 NOTE — Telephone Encounter (Signed)
Spoke to patient . Patient last appointment was in Aug 2022.  RN informed patient to continue having her medication refilled ,an appointment will  needed to be made. ? Appointment schedule for 11/19/21 at 11:40 am with Dr Claiborne Billings ? Patient verbalized understanding. Prescription e-sent  to pharmacy. ?Patient states she will see appointment on mychart. ?

## 2021-07-09 ENCOUNTER — Ambulatory Visit (HOSPITAL_COMMUNITY)
Admission: RE | Admit: 2021-07-09 | Discharge: 2021-07-09 | Disposition: A | Discharge status: Home / Self Care | Payer: Medicaid Other | Source: Ambulatory Visit | Attending: Hematology and Oncology | Admitting: Hematology and Oncology

## 2021-07-09 DIAGNOSIS — C50811 Malignant neoplasm of overlapping sites of right female breast: Secondary | ICD-10-CM | POA: Diagnosis present

## 2021-07-09 DIAGNOSIS — Z170 Estrogen receptor positive status [ER+]: Secondary | ICD-10-CM | POA: Diagnosis present

## 2021-07-09 MED ORDER — TECHNETIUM TC 99M MEDRONATE IV KIT
20.0000 | PACK | Freq: Once | INTRAVENOUS | Status: AC | PRN
  Administered 2021-07-09: 20 via INTRAVENOUS

## 2021-07-13 ENCOUNTER — Other Ambulatory Visit: Payer: Self-pay

## 2021-07-13 ENCOUNTER — Inpatient Hospital Stay: Payer: Medicaid Other | Attending: Hematology and Oncology | Admitting: Hematology and Oncology

## 2021-07-13 DIAGNOSIS — C50811 Malignant neoplasm of overlapping sites of right female breast: Secondary | ICD-10-CM | POA: Diagnosis present

## 2021-07-13 DIAGNOSIS — Z170 Estrogen receptor positive status [ER+]: Secondary | ICD-10-CM | POA: Insufficient documentation

## 2021-07-13 NOTE — Assessment & Plan Note (Addendum)
07/18/2016: Palpable right breast masses for 1 year; 2 adjacent spiculated masses by ultrasound at 1:00 subareolar 4.2 cm mass; 3 satellite nodules at 9:00; 2 abnormal lymph nodes; biopsy of the 2 masses and the lymph node showed similar-appearing grade 3 IDC ER 100% PR 95% Ki-67 20-30%, HER-2 negative ratio 1.57; T2 N1 stage IIB (AJCC 8) ?Right treatment for lymphoma ?? ?Treatment summary: ?1. Neoadjuvant chemotherapy with Taxotere?and Cytoxan every 3 weeks??4??completed July 2018 ?2.?01/30/2017:Right mastectomy: IDC grade 3, 7.2 cm, high-grade DCIS, lymphovascular invasion present including dermal lymphatics, perineural invasion present, margins negative, 4/5 lymph nodes positive with extracapsular extension, ER 100%, PR 100%, HER-2 negative ratio 1.41 ?3.Adjuvant radiation therapy?05/29/2017-07/18/2017 ?5.?Followed by antiestrogen therapy with anastrozole started 04/13/2018 ?--------------------------------------------------------------------- ?Current treatment:?Anastrozole 1 mg daily to start 04/13/2018, patient had not had menstrual cycle since 2010 ?? ?Anastrozole?toxicities: ?1.??Hot flashes: She was previously on Effexor but has not been taking it.  I will send another prescription for Effexor. ?? ?Breast cancer surveillance: ?1.??Breast exam 06/21/2021: Benign ?2.??Mammogram:?05/13/2018: No evidence of malignancy, breast density category B.  She needs to do annual mammograms On the left breast ?? ?Elevated alkaline phosphatase: Along with elevated calcium levels: ?1.  Bone scan 07/11/2021: Benign no bone metastases ?2. PTH RP less than 2, calcium 10.5, alkaline phosphatase 182, PTH: 55 ? ?Recommendation: Ultrasound of the parathyroid glands ?Return to clinic in 1 year for follow-up ?

## 2021-07-16 ENCOUNTER — Telehealth: Payer: Self-pay | Admitting: Hematology and Oncology

## 2021-07-16 NOTE — Telephone Encounter (Signed)
Scheduled appointment per 4/21 los. Patient is aware of the upcoming appointment. ?

## 2021-07-23 ENCOUNTER — Other Ambulatory Visit: Payer: Self-pay | Admitting: Hematology and Oncology

## 2021-07-23 ENCOUNTER — Ambulatory Visit (HOSPITAL_COMMUNITY)
Admission: RE | Admit: 2021-07-23 | Discharge: 2021-07-23 | Disposition: A | Discharge status: Home / Self Care | Payer: Medicaid Other | Source: Ambulatory Visit | Attending: Hematology and Oncology | Admitting: Hematology and Oncology

## 2021-07-23 DIAGNOSIS — Z170 Estrogen receptor positive status [ER+]: Secondary | ICD-10-CM | POA: Insufficient documentation

## 2021-07-23 DIAGNOSIS — C50811 Malignant neoplasm of overlapping sites of right female breast: Secondary | ICD-10-CM

## 2021-07-24 ENCOUNTER — Other Ambulatory Visit: Payer: Self-pay | Admitting: Hematology and Oncology

## 2021-07-24 DIAGNOSIS — I150 Renovascular hypertension: Secondary | ICD-10-CM

## 2021-07-25 ENCOUNTER — Inpatient Hospital Stay: Payer: Medicaid Other | Attending: Hematology and Oncology | Admitting: Hematology and Oncology

## 2021-07-25 DIAGNOSIS — C50811 Malignant neoplasm of overlapping sites of right female breast: Secondary | ICD-10-CM | POA: Diagnosis not present

## 2021-07-25 DIAGNOSIS — Z170 Estrogen receptor positive status [ER+]: Secondary | ICD-10-CM

## 2021-07-25 NOTE — Assessment & Plan Note (Signed)
07/18/2016: Palpable right breast masses for 1 year; 2 adjacent spiculated masses by ultrasound at 1:00 subareolar 4.2 cm mass; 3 satellite nodules at 9:00; 2 abnormal lymph nodes; biopsy of the 2 masses and the lymph node showed similar-appearing grade 3 IDC ER 100% PR 95% Ki-67 20-30%, HER-2 negative ratio 1.57; T2 N1 stage IIB (AJCC 8) ?Right treatment for lymphoma ?? ?Treatment summary: ?1. Neoadjuvant chemotherapy with Taxotere?and Cytoxan every 3 weeks??4??completed July 2018 ?2.?01/30/2017:Right mastectomy: IDC grade 3, 7.2 cm, high-grade DCIS, lymphovascular invasion present including dermal lymphatics, perineural invasion present, margins negative, 4/5 lymph nodes positive with extracapsular extension, ER 100%, PR 100%, HER-2 negative ratio 1.41 ?3.Adjuvant radiation therapy?05/29/2017-07/18/2017 ?5.?Followed by antiestrogen therapy with?anastrozole started 04/13/2018 ?--------------------------------------------------------------------- ?Current treatment:?Anastrozole 1 mg daily to start 04/13/2018, patient had not had menstrual cycle since 2010 ?? ?Anastrozole?toxicities: ?1.??Hot flashes:?She was previously on Effexor but has not been taking it. ?I will send another prescription for Effexor. ?? ?Breast cancer surveillance: ?1.??Breast exam?06/21/2021: Benign ?2.??Mammogram:?05/13/2018: No evidence of malignancy, breast density category B. ?She needs to do annual mammograms On the left breast ?? ?Elevated alkaline phosphatase: Along with elevated calcium levels: ?1.  Bone scan 07/11/2021: Benign no bone metastases ?2. PTH RP less than 2, calcium 10.5, alkaline phosphatase 182, PTH: 55 ?Ultrasound thyroid: Multinodular thyroid gland: Does not need biopsy.  No abnormal parathyroid tissue is identified ?I discussed with the patient the results of the ultrasound and do not require any additional testing at this time. ? ?Return to clinic in 1 year for follow-up ?

## 2021-07-25 NOTE — Progress Notes (Signed)
HEMATOLOGY-ONCOLOGY TELEPHONE VISIT PROGRESS NOTE ? ?I connected with [name] on 07/25/21 at  2:00 PM EDT by telephone and verified that I am speaking with the correct person using two identifiers.  ?I discussed the limitations, risks, security and privacy concerns of performing an evaluation and management service by telephone and the availability of in person appointments.  ?I also discussed with the patient that there may be a patient responsible charge related to this service. The patient expressed understanding and agreed to proceed.  ? ?History of Present Illness:  ? ?Oncology History  ?Malignant neoplasm of overlapping sites of right breast in female, estrogen receptor positive ([place])  ?07/18/2016 Initial Diagnosis  ? Palpable right breast masses for 1 year; 2 adjacent spiculated masses by ultrasound at 1:00 subareolar 4.2 cm mass; 3 satellite nodules at 9:00; 2 abnormal lymph nodes; biopsy of the 2 masses and the lymph node showed similar-appearing grade 3 IDC ER 100% PR 95% Ki-67 20-30%, HER-2 negative ratio 1.57; T2 N1 stage IIB (AJCC 8) ? ?  ?08/09/2016 - 10/11/2016 Neo-Adjuvant Chemotherapy  ? Taxotere and Cytoxan every 3 weeks ?4 cycles ? ?  ?08/22/2016 Genetic Testing  ? 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  The Hereditary Gene Panel offered by [facility] includes sequencing and/or deletion duplication testing of the following 46 genes: APC, ATM, AXIN2, BARD1, BMPR1A, BRCA1, BRCA2, BRIP1, CDH1, CDKN2A (p14ARF), CDKN2A (p16INK4a), CHEK2, CTNNA1, DICER1, EPCAM (Deletion/duplication testing only), GREM1 (promoter region deletion/duplication testing only), KIT, MEN1, MLH1, MSH2, MSH3, MSH6, MUTYH, NBN, NF1, NHTL1, PALB2, PDGFRA, PMS2, POLD1, POLE, PTEN, RAD50, RAD51C, RAD51D, SDHB, SDHC, SDHD, SMAD4, SMARCA4. STK11, TP53, TSC1, TSC2, and VHL.  The following genes were evaluated for sequence changes only: SDHA and HOXB13 c.251G>A variant only.  The report date is Aug 22, 2016. ? ? ?  ?01/30/2017 Surgery  ? Right mastectomy: IDC grade 3, 7.2 cm, high-grade DCIS, lymphovascular invasion present including dermal lymphatics, perineural invasion present, margins negative, 4/5 lymph nodes positive with extracapsular extension, ER 100%, PR 100%, HER-2 negative ratio 1.41 ? ?  ?05/29/2017 - 07/18/2017 Radiation Therapy  ? Adjuvant radiation therapy ? ?  ?04/13/2018 -  Anti-estrogen oral therapy  ? Initially prescribed tamoxifen but apparently she did not pick it up.  Because she is menopausal we are starting anastrozole 1 mg daily starting 04/13/2018 ? ?  ? ? ?REVIEW OF SYSTEMS:   ?Constitutional: Denies fevers, chills or abnormal weight loss ?All other systems were reviewed with the patient and are negative. ?Observations/Objective:  ? ?  ?Assessment Plan:  ?Malignant neoplasm of overlapping sites of right breast in female, estrogen receptor positive ([name]) ?07/18/2016: Palpable right breast masses for 1 year; 2 adjacent spiculated masses by ultrasound at 1:00 subareolar 4.2 cm mass; 3 satellite nodules at 9:00; 2 abnormal lymph nodes; biopsy of the 2 masses and the lymph node showed similar-appearing grade 3 IDC ER 100% PR 95% Ki-67 20-30%, HER-2 negative ratio 1.57; T2 N1 stage IIB (AJCC 8) ?Right treatment for lymphoma ?  ?Treatment summary: ?1. Neoadjuvant chemotherapy with Taxotere and Cytoxan every 3 weeks ?4  completed July 2018 ?2. 01/30/2017:Right mastectomy: IDC grade 3, 7.2 cm, high-grade DCIS, lymphovascular invasion present including dermal lymphatics, perineural invasion present, margins negative, 4/5 lymph nodes positive with extracapsular extension, ER 100%, PR 100%, HER-2 negative ratio 1.41 ?3.Adjuvant radiation therapy 05/29/2017-07/18/2017 ?5. Followed by antiestrogen therapy with anastrozole started 04/13/2018 ?--------------------------------------------------------------------- ?Current treatment: Anastrozole 1 mg daily to start 04/13/2018, patient had not had menstrual cycle  since 2010 ?  ?Anastrozole toxicities: ?1.  Hot flashes: She was previously on Effexor but has not been taking it.  I will send another prescription for Effexor. ?  ?Breast cancer surveillance: ?1.  Breast exam 06/21/2021: Benign ?2.  Mammogram: 05/13/2018: No evidence of malignancy, breast density category B.  She needs to do annual mammograms On the left breast ?  ?Elevated alkaline phosphatase: Along with elevated calcium levels: ?1.  Bone scan 07/11/2021: Benign no bone metastases ?2. PTH RP less than 2, calcium 10.5, alkaline phosphatase 182, PTH: 55 ?Ultrasound thyroid: Multinodular thyroid gland: Does not need biopsy.  No abnormal parathyroid tissue is identified ?I discussed with the patient the results of the ultrasound and do not require any additional testing at this time. ? ?Return to clinic in 1 year for follow-up ? ? ?I discussed the assessment and treatment plan with the patient. The patient was provided an opportunity to ask questions and all were answered. The patient agreed with the plan and demonstrated an understanding of the instructions. The patient was advised to call back or seek an in-person evaluation if the symptoms worsen or if the condition fails to improve as anticipated.  ? ?I provided 12 minutes of non-face-to-face time during this encounter. Harriette Ohara, MD  ? ?I have reviewed the above documentation for accuracy and completeness, and I agree with the above. ? ?

## 2021-08-01 ENCOUNTER — Other Ambulatory Visit: Payer: Self-pay | Admitting: Physician Assistant

## 2021-08-01 DIAGNOSIS — Z79899 Other long term (current) drug therapy: Secondary | ICD-10-CM

## 2021-08-01 DIAGNOSIS — I10 Essential (primary) hypertension: Secondary | ICD-10-CM

## 2021-08-01 DIAGNOSIS — I150 Renovascular hypertension: Secondary | ICD-10-CM

## 2021-08-20 ENCOUNTER — Other Ambulatory Visit: Payer: Self-pay | Admitting: Cardiovascular Disease

## 2021-10-28 ENCOUNTER — Other Ambulatory Visit: Payer: Self-pay | Admitting: Cardiovascular Disease

## 2021-11-19 ENCOUNTER — Ambulatory Visit: Payer: Medicaid Other | Admitting: Cardiovascular Disease

## 2021-12-25 ENCOUNTER — Ambulatory Visit: Payer: Medicaid Other | Admitting: Cardiovascular Disease

## 2022-01-17 ENCOUNTER — Other Ambulatory Visit: Payer: Self-pay | Admitting: Cardiovascular Disease

## 2022-01-17 DIAGNOSIS — I10 Essential (primary) hypertension: Secondary | ICD-10-CM

## 2022-01-17 DIAGNOSIS — I150 Renovascular hypertension: Secondary | ICD-10-CM

## 2022-01-17 DIAGNOSIS — Z79899 Other long term (current) drug therapy: Secondary | ICD-10-CM

## 2022-01-21 ENCOUNTER — Telehealth: Payer: Self-pay | Admitting: Internal Medicine

## 2022-01-21 NOTE — Telephone Encounter (Signed)
Called and left voicemail that patient is needing a office visit for more refills. Last seen 02/05/2021

## 2022-02-01 NOTE — Telephone Encounter (Signed)
Per patient's chart she is now scheduled for an OV. Will close encounter.

## 2022-02-11 ENCOUNTER — Ambulatory Visit: Payer: Medicaid Other | Admitting: Primary Care

## 2022-02-11 ENCOUNTER — Ambulatory Visit (INDEPENDENT_AMBULATORY_CARE_PROVIDER_SITE_OTHER): Payer: Medicaid Other | Admitting: Nurse Practitioner

## 2022-02-11 ENCOUNTER — Encounter: Payer: Self-pay | Admitting: Nurse Practitioner

## 2022-02-11 ENCOUNTER — Ambulatory Visit (INDEPENDENT_AMBULATORY_CARE_PROVIDER_SITE_OTHER): Payer: Medicaid Other

## 2022-02-11 VITALS — BP 126/82 | HR 78 | Ht 63.0 in | Wt 176.6 lb

## 2022-02-11 DIAGNOSIS — I5022 Chronic systolic (congestive) heart failure: Secondary | ICD-10-CM | POA: Diagnosis not present

## 2022-02-11 DIAGNOSIS — R0789 Other chest pain: Secondary | ICD-10-CM | POA: Insufficient documentation

## 2022-02-11 DIAGNOSIS — J45901 Unspecified asthma with (acute) exacerbation: Secondary | ICD-10-CM | POA: Insufficient documentation

## 2022-02-11 DIAGNOSIS — J441 Chronic obstructive pulmonary disease with (acute) exacerbation: Secondary | ICD-10-CM

## 2022-02-11 DIAGNOSIS — R079 Chest pain, unspecified: Secondary | ICD-10-CM

## 2022-02-11 MED ORDER — TRELEGY ELLIPTA 100-62.5-25 MCG/ACT IN AEPB: 1.0000 | INHALATION_SPRAY | Freq: Every day | RESPIRATORY_TRACT | 5 refills | Status: DC

## 2022-02-11 MED ORDER — PREDNISONE 20 MG PO TABS: 40.0000 mg | ORAL_TABLET | Freq: Every day | ORAL | 0 refills | Status: AC

## 2022-02-11 MED ORDER — DOXYCYCLINE HYCLATE 100 MG PO TABS: 100.0000 mg | ORAL_TABLET | Freq: Two times a day (BID) | ORAL | 0 refills | Status: AC

## 2022-02-11 NOTE — Assessment & Plan Note (Addendum)
Euvolemic on exam and weights are stable. Follow up with cardiology.

## 2022-02-11 NOTE — Progress Notes (Signed)
_0  ID: [name], female    DOB: Jan 19, 1971, 51 y.o.   MRN: [id number]  Chief Complaint  Patient presents with   Follow-up    Pt overdue f/u she is needing a refill on her Trelegy. She states she is having some radiating chest pains, she also reports that her breathing is "so-so"    Referring provider: Elinor Parkinson  HPI: 51 year old female, active smoker followed for COPD/asthma.  She is a patient Dr. Gustavus Bryant and last seen in office 02/05/2021.  Past medical history significant for hypertension, left renal artery stenosis, history of NSTEMI, CAD, CHF, GERD, Bell's palsy, history of right breast cancer stage IIb.   TEST/EVENTS:  12/25/2020 PFT: FVC 75, FEV1 58, ratio 55, TLC 101, DLCOcor 59 01/03/2021 echocardiogram: EF 45 to 50% with hypokinesis.  Left atrial dilatation.  Mild MR.  RV size and function normal.  02/05/2021: OV with Dr. Melvyn Novas.  Doing well on Trelegy.  Able to do treadmill up to 35 minutes x 3 days a week.  No orthopnea.  Still smoking.  Follow-up 12 months  02/11/2022: Today-follow-up Patient presents today for yearly follow-up.  She ran out of Trelegy about a month ago.  Unfortunately, she has had some worsening respiratory symptoms since.  Feels like her breathing has been more short and she is having a frequent, productive cough with yellow sputum.  She also been wheezing more.  This has been ongoing for the past 3 weeks or so, since she came off the Trelegy.  She is also had some right-sided chest discomfort which occasionally radiates from her neck down.  Notices that the pain is worse when she lays flat.  No exertional trigger.  Does not seem to be worse after eating.  She has not tried any nitroglycerin for this.  Denies any palpitations, dizziness, lightheadedness, PND, orthopnea, leg swelling.  She thinks that it is primarily related to her COPD/asthma.  Has not talked her cardiologist about this.  Currently using albuterol few times a day.  Denies  any fevers, chills, hemoptysis, calf pain.   Allergies  Allergen Reactions   Compazine [Prochlorperazine] Anaphylaxis, Swelling and Other (See Comments)    Throat swelling   Ondansetron Hcl Anaphylaxis, Swelling and Other (See Comments)    Throat swelling per patient     There is no immunization history on file for this patient.  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   Myocardial infarction [facility]) 2017   Renal artery stenosis (HCC)    s/p stent placment to left    Respiratory failure ([place]) 03/2019    Tobacco History: Social History   Tobacco Use  Smoking Status Every Day   Packs/day: 0.50   Years: 15.00   Total pack years: 7.50   Types: Cigarettes  Smokeless Tobacco Never  Tobacco Comments   5 cigs per day 02/11/22 [facility]   Ready to quit: Not Answered Counseling given: Not Answered Tobacco comments:  5 cigs per day 02/11/22 [facility]   Outpatient Medications Prior to Visit  Medication Sig Dispense Refill   acetaminophen (TYLENOL) 500 MG tablet Take 1,000 mg by mouth every 8 (eight) hours as needed for mild pain or headache.     albuterol (PROVENTIL) (2.5 MG/3ML) 0.083% nebulizer solution Take 3 mLs (2.5 mg total) by nebulization every 6 (six) hours as needed for wheezing or shortness of breath. 75 mL 12   albuterol (VENTOLIN HFA) 108 (90 Base) MCG/ACT inhaler Inhale 2 puffs into the lungs every 6 (six) hours as needed for wheezing or shortness of breath.     aspirin 81 MG chewable tablet Chew 81 mg by mouth daily.     atorvastatin (LIPITOR) 80 MG tablet TAKE 1 TABLET BY MOUTH DAILY AT 6  PM. 90 tablet 2   [facility] D3 5000 units capsule Take 5,000 Units by mouth daily.  11   furosemide (LASIX) 40 MG tablet Take 1 tablet (40 mg total) by mouth daily. Please attend scheduled appointment for additional refills. 30 tablet 3   letrozole (FEMARA) 2.5 MG tablet Take 1 tablet (2.5 mg total) by mouth daily. 90 tablet 3   metoprolol succinate (TOPROL-XL) 100 MG 24 hr tablet Take 1 tablet (100 mg total) by mouth daily. Take with or immediately following a meal. Keep appointment in Aug 2023 for future refills 90 tablet 1   nitroGLYCERIN (NITROSTAT) 0.4 MG SL tablet Place 1 tablet (0.4 mg total) under the tongue every 5 (five) minutes as needed for chest pain. 25 tablet 3   sacubitril-valsartan (ENTRESTO) 97-103 MG Take 1 tablet by mouth 2 (two) times daily. 60 tablet 11   spironolactone (ALDACTONE) 25 MG tablet TAKE 1 TABLET BY MOUTH EVERY DAY 90 tablet 3   venlafaxine XR (EFFEXOR-XR) 37.5 MG 24 hr capsule Take 1 capsule (37.5 mg total) by mouth daily with breakfast. 90 capsule 3   Fluticasone-Umeclidin-Vilant (TRELEGY ELLIPTA) 100-62.5-25 MCG/INH AEPB One click each am (Patient not taking: Reported on 02/11/2022) 1 each 11   Fluticasone-Umeclidin-Vilant (TRELEGY ELLIPTA) 100-62.5-25 MCG/INH AEPB Inhale 1 puff into the lungs daily. (Patient not taking: Reported on 02/11/2022) 14 each 0   No facility-administered medications prior to visit.     Review of Systems:   Constitutional: No weight loss or gain, night sweats, fevers, chills, fatigue, or lassitude. HEENT: No headaches, difficulty swallowing, tooth/dental problems, or sore throat. No sneezing, itching, ear ache, nasal congestion, or post nasal drip CV:  +chest discomfort/pain. No orthopnea, PND, swelling in lower extremities, anasarca, dizziness, palpitations, syncope Resp: +shortness of breath with exertion; wheezing; productive cough. No hemoptysis. No chest wall deformity GI:  No heartburn, indigestion, abdominal pain, nausea, vomiting,  diarrhea, change in bowel habits, loss of appetite, bloody stools.  GU: No dysuria, change in color of urine, urgency or frequency.  No flank pain, no hematuria  Skin: No rash, lesions, ulcerations MSK:  No joint pain or swelling.  No decreased range of motion.  No back pain. Neuro: No dizziness or lightheadedness.  Psych: No depression or anxiety. Mood stable.     Physical Exam:  BP 126/82   Pulse 78   Ht _0  (1.6 m)   Wt 176 lb 9.6 oz (80.1 kg)   SpO2 97%   BMI 31.28 kg/m   GEN: Pleasant, interactive, well-appearing; obese; in no acute distress. HEENT:  Normocephalic and atraumatic. PERRLA. Sclera white. Nasal turbinates pink, moist and patent bilaterally. No rhinorrhea present. Oropharynx pink and moist, without exudate or edema.  No lesions, ulcerations, or postnasal drip.  NECK:  Supple w/ fair ROM. No JVD present. Normal carotid impulses w/o bruits. Thyroid symmetrical with no goiter or nodules palpated. No lymphadenopathy.   CV: RRR, no m/r/g, no peripheral edema. Pulses intact, +2 bilaterally. No cyanosis, pallor or clubbing. PULMONARY:  Unlabored, regular breathing. Scattered rhonchi bilaterally A&P. No accessory muscle use.  GI: BS present and normoactive. Soft, non-tender to palpation. No organomegaly or masses detected.  MSK: No erythema, warmth or tenderness. Cap refil <2 sec all extrem. No deformities or joint swelling noted.  Neuro: A/Ox3. No focal deficits noted.   Skin: Warm, no lesions or rashe Psych: Normal affect and behavior. Judgement and thought content appropriate.     Lab Results:  CBC    Component Value Date/Time   WBC 8.7 06/21/2021 1227   WBC 9.5 11/13/2020 1125   RBC 4.91 06/21/2021 1227   HGB 14.9 06/21/2021 1227   HGB 13.9 07/02/2019 1215   HGB 11.5 (L) 10/11/2016 0955   HCT 44.3 06/21/2021 1227   HCT 40.6 07/02/2019 1215   HCT 34.8 10/11/2016 0955   PLT 251 06/21/2021 1227   PLT 230 07/02/2019 1215   MCV 90.2 06/21/2021 1227   MCV 90  07/02/2019 1215   MCV 93.8 10/11/2016 0955   MCH 30.3 06/21/2021 1227   MCHC 33.6 06/21/2021 1227   RDW 13.5 06/21/2021 1227   RDW 14.4 07/02/2019 1215   RDW 17.7 (H) 10/11/2016 0955   LYMPHSABS 2.9 06/21/2021 1227   LYMPHSABS 2.9 10/11/2016 0955   MONOABS 0.5 06/21/2021 1227   MONOABS 2.0 (H) 10/11/2016 0955   EOSABS 0.1 06/21/2021 1227   EOSABS 0.0 10/11/2016 0955   BASOSABS 0.1 06/21/2021 1227   BASOSABS 0.0 10/11/2016 0955    BMET    Component Value Date/Time   NA 140 06/21/2021 1227   NA 139 08/25/2020 1124   NA 142 10/11/2016 0955   K 3.8 06/21/2021 1227   K 3.2 (L) 10/11/2016 0955   CL 110 06/21/2021 1227   CO2 23 06/21/2021 1227   CO2 23 10/11/2016 0955   GLUCOSE 92 06/21/2021 1227   GLUCOSE 84 10/11/2016 0955   BUN 8 06/21/2021 1227   BUN 9 08/25/2020 1124   BUN 6.8 (L) 10/11/2016 0955   CREATININE 0.86 06/21/2021 1227   CREATININE 0.7 10/11/2016 0955   CALCIUM 10.5 (H) 06/21/2021 1227   CALCIUM 9.5 10/11/2016 0955   GFRNONAA >60 06/21/2021 1227   GFRAA 103 07/02/2019 1215    BNP    Component Value Date/Time   BNP 28.8 08/25/2020 1124   BNP 348.6 (H) 04/11/2019 0520     Imaging:  DG Chest 2 View  Result Date: 02/11/2022 CLINICAL DATA:  Shortness of breath, productive cough EXAM: CHEST - 2 VIEW COMPARISON:  11/13/2020 FINDINGS: The heart size and mediastinal contours are within normal limits. Left chest catheter fragment. Diffuse bilateral interstitial opacity. The visualized skeletal structures are unremarkable. Right breast implant or spacer. IMPRESSION: Mild diffuse bilateral interstitial pulmonary opacity, which may reflect atypical/viral infection or edema. No focal airspace opacity. Electronically Signed   By: Delanna Ahmadi M.D.   On: 02/11/2022 12:45         Latest Ref Rng & Units 12/25/2020   12:49 PM  PFT Results  FVC-Pre L 2.11   FVC-Predicted Pre % 75   FVC-Post L 2.35   FVC-Predicted Post % 83   Pre FEV1/FVC % % 63   Post FEV1/FCV  % % 55  FEV1-Pre L 1.33   FEV1-Predicted Pre % 58   FEV1-Post L 1.30   DLCO uncorrected ml/min/mmHg 10.52   DLCO UNC% % 51   DLCO corrected ml/min/mmHg 10.52   DLCO COR %Predicted % 51   DLVA Predicted % 59   TLC L 4.97   TLC % Predicted % 101   RV % Predicted % 138     No results found for: "NITRICOXIDE"      Assessment & Plan:   Asthma with COPD with exacerbation (HCC) AECOPD/asthma, likely related to being off maintenance therapy.  We will restart her Trelegy.  We are going to treat her with prednisone burst and empiric doxycycline course.  Chest discomfort seems to be related to this.  We will obtain CXR to rule out superimposed infection.  Continue albuterol as needed.  Mucinex for mucolytic therapy.  Close follow-up.  Patient Instructions  Continue Albuterol inhaler 2 puffs or 3 mL neb every 6 hours as needed for shortness of breath or wheezing. Notify if symptoms persist despite rescue inhaler/neb use. Restart Trelegy 1 puff daily. Brush tongue and rinse mouth afterwards  Guaifenesin 600 mg Twice daily for chest  Doxycycline 1 tab Twice daily 7 days. Take with food. Wear sunscreen or avoid direct sun exposure while taking - medication increases risk for sunburns/hyperpigmentation of the skin Prednisone 40 mg daily for 5 days. Take in AM with food    Atypical chest pain Onset 1 month ago. She has associated dyspnea and cough. Suspect this is related to AECOPD/asthma. EKG today was unchanged when compared to previous. No new ST changes. CXR ordered to rule out superimposed infection. Strict ED precautions. She will follow up with cardiology if symptoms do not improve.   Chronic systolic CHF (congestive heart failure) (HCC) Euvolemic on exam and weights are stable. Follow up with cardiology.    I spent 45 minutes of dedicated to the care of this patient on the date of this encounter to include pre-visit review of records, face-to-face time with the patient discussing  conditions above, post visit ordering of testing, clinical documentation with the electronic health record, making appropriate referrals as documented, and communicating necessary findings to members of the patients care team.  Clayton Bibles, NP 02/11/2022  Pt aware and understands NP's role.

## 2022-02-11 NOTE — Patient Instructions (Addendum)
Continue Albuterol inhaler 2 puffs or 3 mL neb every 6 hours as needed for shortness of breath or wheezing. Notify if symptoms persist despite rescue inhaler/neb use. Restart Trelegy 1 puff daily. Brush tongue and rinse mouth afterwards  Guaifenesin 600 mg Twice daily for chest  Doxycycline 1 tab Twice daily 7 days. Take with food. Wear sunscreen or avoid direct sun exposure while taking - medication increases risk for sunburns/hyperpigmentation of the skin Prednisone 40 mg daily for 5 days. Take in AM with food  Go to the emergency department if your chest pain returns and does not go away. You can try one of your nitroglycerin tablets as well, as prescribed by your cardiologist. Follow up with cardiology if pain continues or doesn't improve.  Chest x ray today  Follow up in 2 weeks with Dr. Melvyn Novas or Alanson Aly. If symptoms do not improve or worsen, please contact office for sooner follow up or seek emergency care.

## 2022-02-11 NOTE — Assessment & Plan Note (Signed)
AECOPD/asthma, likely related to being off maintenance therapy.  We will restart her Trelegy.  We are going to treat her with prednisone burst and empiric doxycycline course.  Chest discomfort seems to be related to this.  We will obtain CXR to rule out superimposed infection.  Continue albuterol as needed.  Mucinex for mucolytic therapy.  Close follow-up.  Patient Instructions  Continue Albuterol inhaler 2 puffs or 3 mL neb every 6 hours as needed for shortness of breath or wheezing. Notify if symptoms persist despite rescue inhaler/neb use. Restart Trelegy 1 puff daily. Brush tongue and rinse mouth afterwards  Guaifenesin 600 mg Twice daily for chest  Doxycycline 1 tab Twice daily 7 days. Take with food. Wear sunscreen or avoid direct sun exposure while taking - medication increases risk for sunburns/hyperpigmentation of the skin Prednisone 40 mg daily for 5 days. Take in AM with food

## 2022-02-11 NOTE — Assessment & Plan Note (Signed)
Onset 1 month ago. She has associated dyspnea and cough. Suspect this is related to AECOPD/asthma. EKG today was unchanged when compared to previous. No new ST changes. CXR ordered to rule out superimposed infection. Strict ED precautions. She will follow up with cardiology if symptoms do not improve.

## 2022-02-13 NOTE — Progress Notes (Signed)
CXR showed some increased interstitial opacities. This could reflect acute infection or bronchitis. She is adequately covered with doxycycline and prednisone. Plan to recheck 6 weeks after she completes abx. Thanks.

## 2022-02-26 ENCOUNTER — Ambulatory Visit: Payer: Medicaid Other | Admitting: Nurse Practitioner

## 2022-03-02 ENCOUNTER — Other Ambulatory Visit: Payer: Self-pay | Admitting: Cardiovascular Disease

## 2022-03-04 ENCOUNTER — Other Ambulatory Visit: Payer: Self-pay | Admitting: Cardiovascular Disease

## 2022-04-04 ENCOUNTER — Telehealth: Payer: Self-pay

## 2022-04-04 NOTE — Telephone Encounter (Signed)
Received information from pharmacy that patient Entresto 97/103 needed PA.   Key: [facility]  Last name: Luis DOB: 06-Apr-1970   Will route to Jeani Hawking, LPN to assist.   Thank you!

## 2022-04-08 ENCOUNTER — Other Ambulatory Visit: Payer: Self-pay

## 2022-04-08 MED ORDER — ENTRESTO 97-103 MG PO TABS: 1.0000 | ORAL_TABLET | Freq: Two times a day (BID) | ORAL | 3 refills | Status: DC

## 2022-04-08 NOTE — Telephone Encounter (Signed)
**Note De-Identified Latanza Pfefferkorn Obfuscation** Entresto PA stared through covermymeds. Key: [id number]

## 2022-04-29 NOTE — Telephone Encounter (Signed)
**Note De-Identified Mckell Riecke Obfuscation** [employer] denied coverage of the pts Entresto but no reason given for the denial in the letter.  I called [employer] and s/w Ann who advised me that their pharmacy team denied the pts Entresto coverage because I did not include any recent notes that show that the pt has had improvement in her EF.  I disagreed with this decision as I did include the pts Echo results from 04/11/2019(EF= 25%-30%), office visit notes from 04/25/2019 when Delene Loll was started, and Echo results from 01/03/2021 that shows improvement in the pts EF=45%-50%.  Per Lelon Frohlich they need more recent notes. The most recent note we have for this pt is her office visit notes from 11/22/2020 with Dr Claiborne Billings which I have printed along with another copy of her Echo from 01/17/2021 (already sent in the previous PA that I did) that shows improvement in her EF. I have faxed all to [place] at [phone] with a note to add to [id number] as instructed by Lelon Frohlich.

## 2022-04-30 ENCOUNTER — Ambulatory Visit: Payer: Medicaid Other | Attending: Cardiovascular Disease | Admitting: Cardiovascular Disease

## 2022-04-30 ENCOUNTER — Encounter: Payer: Self-pay | Admitting: Cardiovascular Disease

## 2022-04-30 DIAGNOSIS — I150 Renovascular hypertension: Secondary | ICD-10-CM | POA: Diagnosis not present

## 2022-04-30 DIAGNOSIS — Z853 Personal history of malignant neoplasm of breast: Secondary | ICD-10-CM

## 2022-04-30 DIAGNOSIS — E785 Hyperlipidemia, unspecified: Secondary | ICD-10-CM

## 2022-04-30 DIAGNOSIS — I428 Other cardiomyopathies: Secondary | ICD-10-CM

## 2022-04-30 DIAGNOSIS — I10 Essential (primary) hypertension: Secondary | ICD-10-CM

## 2022-04-30 DIAGNOSIS — Z79899 Other long term (current) drug therapy: Secondary | ICD-10-CM

## 2022-04-30 DIAGNOSIS — G510 Bell's palsy: Secondary | ICD-10-CM

## 2022-04-30 DIAGNOSIS — J4520 Mild intermittent asthma, uncomplicated: Secondary | ICD-10-CM

## 2022-04-30 LAB — CBC

## 2022-04-30 MED ORDER — FUROSEMIDE 40 MG PO TABS: 40.0000 mg | ORAL_TABLET | Freq: Every day | ORAL | 3 refills | Status: DC

## 2022-04-30 MED ORDER — SPIRONOLACTONE 25 MG PO TABS: 25.0000 mg | ORAL_TABLET | Freq: Every day | ORAL | 3 refills | Status: DC

## 2022-04-30 MED ORDER — ENTRESTO 49-51 MG PO TABS: 1.0000 | ORAL_TABLET | Freq: Every day | ORAL | 3 refills | Status: DC

## 2022-04-30 MED ORDER — EMPAGLIFLOZIN 10 MG PO TABS: 10.0000 mg | ORAL_TABLET | Freq: Every day | ORAL | 3 refills | Status: DC

## 2022-04-30 MED ORDER — ATORVASTATIN CALCIUM 80 MG PO TABS: 80.0000 mg | ORAL_TABLET | Freq: Every day | ORAL | 3 refills | Status: DC

## 2022-04-30 MED ORDER — METOPROLOL SUCCINATE ER 100 MG PO TB24: 100.0000 mg | ORAL_TABLET | Freq: Every day | ORAL | 3 refills | Status: DC

## 2022-04-30 NOTE — Telephone Encounter (Signed)
She is seeing Dr.Kelly today- could we use this note? To try and get the appeal approved, or send it over when its completed?

## 2022-04-30 NOTE — Patient Instructions (Signed)
Medication Instructions:  START Jardiance 10 mg daily  The current medical regimen is effective;  continue present plan and medications.  *If you need a refill on your cardiac medications before your next appointment, please call your pharmacy*   Lab Work: CMET, A1C, LIPID, TSH, CBC today    If you have labs (blood work) drawn today and your tests are completely normal, you will receive your results only by: [place] (if you have MyChart) OR A paper copy in the mail If you have any lab test that is abnormal or we need to change your treatment, we will call you to review the results.   Testing/Procedures: Echocardiogram (6 months) - Your physician has requested that you have an echocardiogram. Echocardiography is a painless test that uses sound waves to create images of your heart. It provides your doctor with information about the size and shape of your heart and how well your heart's chambers and valves are working. This procedure takes approximately one hour. There are no restrictions for this procedure.     Follow-Up: At [facility], you and your health needs are our priority.  As part of our continuing mission to provide you with exceptional heart care, we have created designated Provider Care Teams.  These Care Teams include your primary Cardiologist (physician) and Advanced Practice Providers (APPs -  Physician Assistants and Nurse Practitioners) who all work together to provide you with the care you need, when you need it.  We recommend signing up for the patient portal called "MyChart".  Sign up information is provided on this After Visit Summary.  MyChart is used to connect with patients for Virtual Visits (Telemedicine).  Patients are able to view lab/test results, encounter notes, upcoming appointments, etc.  Non-urgent messages can be sent to your provider as well.   To learn more about what you can do with MyChart, go to [email or web address].    Your next  appointment:   6 month(s)  Provider:   Shelva Majestic, MD

## 2022-04-30 NOTE — Progress Notes (Signed)
Cardiology Office Note    Date:  05/05/2022   ID:  [name] 04/17/1970, MRN [id number]  PCP:  Kimberly Lush, PA-C  Cardiologist:  Kimberly Majestic, MD   No chief complaint on file.   18  month F/U office visit   History of Present Illness:  [name] is a 52 y.o. female presents who was last seen by me on November 22, 2020.  She presents for follow-up cardiology evaluation.  [name] has a history of renal vascular hypertension and is status post remote left renal artery stenting, right-sided Bell's palsy in 2001, with residual right-sided facial weakness, and a history of lymphoma the neck.  She suffered a non-ST segment elevation myocardial infarction in June 2017.  Cardiac catheterization by Dr. Ellyn Bates revealed a 65% stenosis in a tortuous segment and medical therapy was recommended.  She had seen [name] in follow-up of her hospitalization in July 2017.  She was seen by Kimberly Newton, [name] for preoperative evaluation prior to breast cancer surgery for which she was needed to be off Plavix.  Subsequently, she was seen by [name].  An ECG showed T-wave inversion V2 through V6 and underwent a nuclear stress test which showed a prior apical infarct and minimal peri-infarct ischemia. EF was estimated at 43% with apical akineis.  I saw her for my initial evaluation with me in the office in January 2019.  At that time she denied any episodes of chest pain or shortness of breath.  On April 01, 2017  she underwent resection of 6 axillary lymph nodes.  She had been off Plavix for several months.   She is now felt to have breast cancer in remission.  She was recently hospitalized from January 17 through April 16, 2019 after presenting with acute hyper cardiac respiratory failure which was sudden in onset regarding the patient.  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  Covid testing was negative.  She self extubated herself on January 18 and was hypertensive.  She was treated with nitrates and Catapres and was started on IV steroids with ultimate transition to prednisone.  Bronchodilators were changed.  During her hypertensive emergency she was seen by cardiology and an echo showed marked reduction in LV function compared to 2017 when her EF was 45 to now being reduced to 25 to 30%.  She was aggressively diuresed and guideline directed medical therapy with Entresto and spironolactone were added to her regimen.  She subsequently underwent right and left heart cardiac catheterization on April 15, 2019 which showed patent coronary arteries and her PA systolic pressure was 33 mm.  I saw her on April 22, 2019 following her hospital discharge.  At that time she was breathing better but still appears of diaphoresis and still experience some mild shortness of breath with activity.  With her continued shortness of breath and blood pressure elevation I recommended slight titration of furosemide to 40 mg daily.  She had been started on Entresto 49/51 mg twice a day for only 2 weeks and at that time did not further titrate Entresto to its maximum dosing.  I recommended follow-up laboratory.  She was evaluated by [name], [name] on Aug 15, 2020 and [name] on [name], [place] on September 05, 2020. She had issues with [name] authorization and ultimately had been out of her medications particularly including Entresto and Lipitor.  Pharmacy  authorization was ultimately obtained.  Due to decreased breath sounds at the base of her lungs she was referred for chest x-ray which was done on September 08, 2020 which was negative for acute cardiopulmonary disease without evidence for central vascular congestion or interlobular septal thickening.  I last saw her on November 22, 2020.  At that time she was breathing better and was on Entresto currently 49/51 mg twice a day,  spironolactone  25 mg daily, metoprolol succinate 100 mg and furosemide 40 mg daily.  She was back on atorvastatin 80 mg.  She had a COVID infection in July 2022.  She had recently seen [name] and smoking cessation was again advised.  She was started on a prednisone taper and Dulera.  She denied any chest pain or tightness.   She underwent an echo Doppler study on December 18, 2020.  EF was estimated at 50 to 55% and 48% x 3 D volume.  Estimated RV systolic pressure was [id number].  There is mild to moderate mitral regurgitation.  She apparently had a subsequent echo on January 03, 2021 which showed EF at 45 to 50% with hypokinesis apically.  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  She has COPD/asthma.  Presently, she denies any chest pain.  At times there is some mild shortness of breath.  She has been taking atorvastatin 80 mg daily for hyperlipidemia and continues to be on Entresto 49/51 twice daily, furosemide 40 mg daily, metoprolol succinate 100 mg, and spironolactone 25 mg daily.  She is on Effexor XR for depression.  She continues to be on Femara.  She continues to be on Trelegy Ellipta and as needed albuterol.  She presents for cardiology evaluation.   Past Medical History:  Diagnosis Date   Asthma    beginning of 2018 - came to ER     Bell's palsy    Breast cancer ([place]) 01/2017   Cardiomegaly    Coronary artery disease    NSTEMI 08/2015 (65% LAD; D1 50%)   Family history of breast cancer    Family history of prostate cancer    Genetic testing of female    BRCA VUS   GERD (gastroesophageal reflux disease)    Hypertension    Lymphoma of lymph nodes of neck ([place])    dx 2002   Myocardial infarction [facility]) 2017   Renal artery stenosis ([facility])    s/p stent placment to left    Respiratory failure ([place]) 03/2019    Past Surgical History:  Procedure Laterality Date   AXILLARY LYMPH NODE DISSECTION  Right 03/13/2017   Procedure: AXILLARY LYMPH NODE DISSECTION;  Surgeon: Erroll Luna, MD;  Location: [place];  Service: General;  Laterality: Right;   BREAST RECONSTRUCTION WITH PLACEMENT OF TISSUE EXPANDER AND FLEX HD (ACELLULAR HYDRATED DERMIS) Right 01/30/2017   Procedure: RIGHT BREAST RECONSTRUCTION WITH PLACEMENT OF TISSUE EXPANDER AND FLEX HD (ACELLULAR HYDRATED DERMIS);  Surgeon: Wallace Going, DO;  Location: [place];  Service: Plastics;  Laterality: Right;   BREAST SURGERY     breast bx  07/2016   CARDIAC CATHETERIZATION N/A 09/18/2015   Procedure: Left Heart Cath and Coronary Angiography;  Surgeon: Leonie Man, MD;  Location: [place] CV LAB;  Service: Cardiovascular;  Laterality: N/A;   [place]   x2   IR CV LINE INJECTION  07/24/2016  MASTECTOMY WITH RADIOACTIVE SEED GUIDED EXCISION AND AXILLARY SENTINEL LYMPH NODE BIOPSY Right 01/30/2017   Procedure: RIGHT SIMPLE MASTECTOMY WITH  RADIOACTIVE SEED TARGETED RIGHT AXILLARY LYMPH NODE EXCISION AND RIGHT AXILLARY SENTINEL LYMPH NODE BIOPSY;  Surgeon: Erroll Luna, MD;  Location: [place];  Service: General;  Laterality: Right;   PERIPHERAL VASCULAR CATHETERIZATION N/A 08/23/2014   Procedure: Renal Angiography;  Surgeon: Adrian Prows, MD;  Location: [place] CV [facility];  Service: Cardiovascular;  Laterality: N/A;   RENAL ARTERY STENT Left 08/23/2014   RIGHT/LEFT HEART CATH AND CORONARY ANGIOGRAPHY N/A 04/15/2019   Procedure: RIGHT/LEFT HEART CATH AND CORONARY ANGIOGRAPHY;  Surgeon: Lorretta Harp, MD;  Location: [place] CV LAB;  Service: Cardiovascular;  Laterality: N/A;   SIMPLE MASTECTOMY Right 01/30/2017   SKIN BIOPSY  2000   TISSUE EXPANDER PLACEMENT Right 10/08/2017   Procedure: REMOVAL OF RIGHT BREAST TISSUE EXPANDER;  Surgeon: Wallace Going, DO;  Location: [place];  Service: Plastics;  Laterality: Right;  Case should be 45 min   TUBAL LIGATION      Current Medications: Outpatient Medications  Prior to Visit  Medication Sig Dispense Refill   acetaminophen (TYLENOL) 500 MG tablet Take 1,000 mg by mouth every 8 (eight) hours as needed for mild pain or headache.     albuterol (PROVENTIL) (2.5 MG/3ML) 0.083% nebulizer solution Take 3 mLs (2.5 mg total) by nebulization every 6 (six) hours as needed for wheezing or shortness of breath. 75 mL 12   albuterol (VENTOLIN HFA) 108 (90 Base) MCG/ACT inhaler Inhale 2 puffs into the lungs every 6 (six) hours as needed for wheezing or shortness of breath.     aspirin 81 MG chewable tablet Chew 81 mg by mouth daily.     cetirizine (ZYRTEC) 10 MG tablet Take 10 mg by mouth daily.     CVS D3 5000 units capsule Take 5,000 Units by mouth daily.  11   Fluticasone-Umeclidin-Vilant (TRELEGY ELLIPTA) 100-62.5-25 MCG/ACT AEPB Inhale 1 puff into the lungs daily. 60 each 5   nitroGLYCERIN (NITROSTAT) 0.4 MG SL tablet Place 1 tablet (0.4 mg total) under the tongue every 5 (five) minutes as needed for chest pain. 25 tablet 3   venlafaxine XR (EFFEXOR-XR) 37.5 MG 24 hr capsule Take 1 capsule (37.5 mg total) by mouth daily with breakfast. 90 capsule 3   atorvastatin (LIPITOR) 80 MG tablet TAKE 1 TABLET BY MOUTH DAILY AT 6 PM. 90 tablet 2   furosemide (LASIX) 40 MG tablet Take 1 tablet (40 mg total) by mouth daily. Please attend scheduled appointment for additional refills. 30 tablet 3   letrozole (FEMARA) 2.5 MG tablet Take 1 tablet (2.5 mg total) by mouth daily. 90 tablet 3   metoprolol succinate (TOPROL-XL) 100 MG 24 hr tablet Take 1 tablet (100 mg total) by mouth daily. Take with or immediately following a meal. Keep appointment in 2024 for future refills 90 tablet 1   sacubitril-valsartan (ENTRESTO) 49-51 MG Take 1 tablet by mouth daily.     spironolactone (ALDACTONE) 25 MG tablet TAKE 1 TABLET BY MOUTH EVERY DAY 90 tablet 3   No facility-administered medications prior to visit.     Allergies:   Compazine [prochlorperazine] and Ondansetron hcl   Social History    Socioeconomic History   Marital status: Single    Spouse name: Not on file   Number of children: 4   Years of education: 9   Highest education level: Not on file  Occupational History   Occupation: Unemployed  Tobacco Use   Smoking status: Every Day    Packs/day: 0.50    Years: 15.00    Total pack years: 7.50    Types: Cigarettes   Smokeless tobacco: Never   Tobacco comments:    5 cigs per day 02/11/22 [facility]  Vaping Use   Vaping Use: Never used  Substance and Sexual Activity   Alcohol use: No    Alcohol/week: 0.0 standard drinks of alcohol   Drug use: No   Sexual activity: Yes    Birth control/protection: None  Other Topics Concern   Not on file  Social History Narrative   Lives at home with fiance and kids   Caffeine use: 2 cups coffee per day   4 Dr. Malachi Bonds (12oz) per day    Social Determinants of Health   Financial Resource Strain: Not on file  Food Insecurity: Not on file  Transportation Needs: Not on file  Physical Activity: Not on file  Stress: Not on file  Social Connections: Not on file     Family History:  The patient's family history includes Asthma in her father; Breast cancer in her maternal aunt; Cervical cancer in her mother; Coronary artery disease (age of onset: 15) in her father; Hypertension in her mother; Prostate cancer in her cousin.   ROS General: Negative; No fevers, chills, or night sweats;  HEENT: Negative; No changes in vision or hearing, sinus congestion, difficulty swallowing Pulmonary: Positive for asthma/COPD Cardiovascular: Negative; No chest pain, presyncope, syncope, palpitations GI: Negative; No nausea, vomiting, diarrhea, or abdominal pain GU: Negative; No dysuria, hematuria, or difficulty voiding Musculoskeletal: Negative; no myalgias, joint pain, or weakness Hematologic/Oncology: Positive for breast CA in remission Endocrine: Negative; no heat/cold intolerance; no diabetes Neuro: Bell's palsy with mild residual right-sided  weakness Skin: Negative; No rashes or skin lesions Psychiatric: Negative; No behavioral problems, depression Sleep: Negative; No snoring, daytime sleepiness, hypersomnolence, bruxism, restless legs, hypnogognic hallucinations, no cataplexy Other comprehensive 14 point system review is negative.   PHYSICAL EXAM:   VS:  BP 130/88   Pulse 70   Ht 5' 3"$  (1.6 m)   Wt 179 lb 6.4 oz (81.4 kg)   SpO2 94%   BMI 31.78 kg/m     Repeat blood pressure by me was 140/84  Wt Readings from Last 3 Encounters:  04/30/22 179 lb 6.4 oz (81.4 kg)  02/11/22 176 lb 9.6 oz (80.1 kg)  07/13/21 173 lb 1.6 oz (78.5 kg)    General: Alert, oriented, no distress.  Skin: normal turgor, no rashes, warm and dry HEENT: Normocephalic, atraumatic. Pupils equal round and reactive to light; sclera anicteric; extraocular muscles intact; Fundi ** Nose without nasal septal hypertrophy Mouth/Parynx benign; Mallinpatti scale 3 Neck: No JVD, no carotid bruits; normal carotid upstroke Lungs: clear to ausculatation and percussion; no wheezing or rales Chest wall: without tenderness to palpitation Heart: PMI not displaced, RRR, s1 s2 normal, 1/6 systolic murmur, no diastolic murmur LSB and apex, no rubs, gallops, thrills, or heaves Abdomen: soft, nontender; no hepatosplenomehaly, BS+; abdominal aorta nontender and not dilated by palpation. Back: no CVA tenderness Pulses 2+ Musculoskeletal: full range of motion, normal strength, no joint deformities Extremities: no clubbing cyanosis or edema, Homan's sign negative  Neurologic: grossly nonfocal; Cranial nerves grossly wnl Psychologic: Normal mood and affect    Studies/Labs Reviewed:   April 30, 2022 ECG (independently read by me): NSR at 70, small Q waves inferiorly, PRWP V1-3, T wave abnormality  November 22, 2020 ECG (independently read by  me):  NSR at 71;small Q waves inferiorly, PRWP V1-28 March 2019 ECG (independently read by me): Normal sinus rhythm at 74 bpm.   Q waves inferiorly.  QTc interval increased at 479 ms.  No ectopy.  T wave abnormality in lead aVL  January 2019 ECG (independently read by me): Normal sinus rhythm at 66 bpm.  Poor anterior R-wave progression V1 to V3 with nonspecific T changes.  Normal intervals.  Recent Labs:    Latest Ref Rng & Units 04/30/2022   10:27 AM 06/21/2021   12:27 PM 11/13/2020   11:25 AM  BMP  Glucose 70 - 99 mg/dL 77  92  78   BUN 6 - 24 mg/dL 10  8  5   $ Creatinine 0.57 - 1.00 mg/dL 0.78  0.86  0.94   BUN/Creat Ratio 9 - 23 13     Sodium 134 - 144 mmol/L 143  140  142   Potassium 3.5 - 5.2 mmol/L 4.0  3.8  3.8   Chloride 96 - 106 mmol/L 107  110  108   CO2 20 - 29 mmol/L 22  23  27   $ Calcium 8.7 - 10.2 mg/dL 11.6  10.5  10.2         Latest Ref Rng & Units 04/30/2022   10:27 AM 06/21/2021   12:27 PM 08/25/2020   11:24 AM  Hepatic Function  Total Protein 6.0 - 8.5 g/dL 6.8  7.2  6.5   Albumin 3.9 - 4.9 g/dL 4.7  4.4  4.5   AST 0 - 40 IU/L 20  23  21   $ ALT 0 - 32 IU/L 25  20  29   $ Alk Phosphatase 44 - 121 IU/L 214  182  220   Total Bilirubin 0.0 - 1.2 mg/dL 0.4  0.6  0.4        Latest Ref Rng & Units 04/30/2022   10:27 AM 06/21/2021   12:27 PM 11/13/2020   11:25 AM  CBC  WBC 3.4 - 10.8 x10E3/uL 10.6  8.7  9.5   Hemoglobin 11.1 - 15.9 g/dL 14.8  14.9  13.8   Hematocrit 34.0 - 46.6 % 43.3  44.3  41.7   Platelets 150 - 450 x10E3/uL 238  251  226.0    Lab Results  Component Value Date   MCV 91 04/30/2022   MCV 90.2 06/21/2021   MCV 93.6 11/13/2020   Lab Results  Component Value Date   TSH 2.920 04/30/2022   Lab Results  Component Value Date   HGBA1C 5.5 04/30/2022     BNP    Component Value Date/Time   BNP 28.8 08/25/2020 1124   BNP 348.6 (H) 04/11/2019 0520    ProBNP    Component Value Date/Time   PROBNP 37.0 11/13/2020 1125     Lipid Panel     Component Value Date/Time   CHOL 220 (H) 04/30/2022 1027   TRIG 172 (H) 04/30/2022 1027   HDL 39 (L) 04/30/2022 1027   CHOLHDL  5.6 (H) 04/30/2022 1027   CHOLHDL 3.9 09/18/2015 0340   VLDL 25 09/18/2015 0340   LDLCALC 150 (H) 04/30/2022 1027     RADIOLOGY: No results found.    Additional studies/ records that were reviewed today include:   I extensively reviewed her most recent hospitalization, right and left heart cardiac catheterization, as well as echo data. Evaluations of Almyra Deforest and [name] were reviewed and most recent [name] pulmonary evaluation.  ASSESSMENT:  1. Nonischemic cardiomyopathy ([place])   2. Renovascular hypertension: s/p left renal artery stenting   3. Essential hypertension   4. Hyperlipidemia LDL goal <70   5. Mild intermittent asthma without complication   6. Medication management   7. Bell's palsy   8. History of breast cancer     PLAN:  [name] is a 52 year-old female who has a history of right-sided Bell's palsy with residual right-sided facial weakness, lymphoma, left renal artery stenosis with stenting in May 2016, asthma, GERD, and tobacco abuse.  She suffered a non-ST segment elevation MI in June 2017.  Cardiac catheterization revealed a  65% stenosis in a very tortuous segment for which medical therapy was recommended.  Troponin was mildly elevated at 1.57.   She was subsequently found to have breast CA which is now felt to be in remission.  She was hospitalized in January 2021 with acute hypercarbic respiratory failure requiring intubation.  An echo Doppler study during that evaluation showed an EF of 25 to 30% with grade 2 diastolic dysfunction.  There was mild elevation of right heart pressures.  Cardiac catheterization did not demonstrate any significant coronary obstructive disease and she had mild increased PA systolic pressure at 33 mm.  During her hospitalization she was started on Entresto 49/51 mg twice a day, spironolactone 25 mg daily, Toprol-XL 50 mg twice a day and has also been on furosemide 20 mg.  With her continued shortness of breath and blood  pressure elevation at her last evaluation with me January 2021 I recommended  slight titration of furosemide to 40 mg.  Her last 2D echo Doppler study was done in October 2022 which showed an EF of 45 to 50%.  The LV apex was hypokinetic.  There was no obvious thrombus.  She had mild MR.  Her blood pressure today was mildly elevated at 140/84 by me and 130/88 upon presentation.  She has continued to be on Entresto 49/51 mg twice a day, furosemide 40 mg daily, metoprolol succinate 100 mg and spironolactone 25 mg daily.  Presently I am electing to add SGLT2 in addition with Jardiance 10 mg.  She is on Medicaid.  I will check fasting laboratory with a comprehensive metabolic panel, CBC, hemoglobin A1c, TSH and fasting lipid studies.  I am recommending she undergo a follow-up echo Doppler study in August/September and I will see her for follow-up evaluation.  I will contact her during her studies.  She continues to be followed by [facility] and also sees Dr. Lindi Bates with her history of breast CVA.   Medication Adjustments/Labs and Tests Ordered: Current medicines are reviewed at length with the patient today.  Concerns regarding medicines are outlined above.  Medication changes, Labs and Tests ordered today are listed in the Patient Instructions below. Patient Instructions  Medication Instructions:  START Jardiance 10 mg daily  The current medical regimen is effective;  continue present plan and medications.  *If you need a refill on your cardiac medications before your next appointment, please call your pharmacy*   Lab Work: CMET, A1C, LIPID, TSH, CBC today    If you have labs (blood work) drawn today and your tests are completely normal, you will receive your results only by: [place] (if you have MyChart) OR A paper copy in the mail If you have any lab test that is abnormal or we need to change your treatment, we will call you to review the  results.   Testing/Procedures: Echocardiogram (6 months) -  Your physician has requested that you have an echocardiogram. Echocardiography is a painless test that uses sound waves to create images of your heart. It provides your doctor with information about the size and shape of your heart and how well your heart's chambers and valves are working. This procedure takes approximately one hour. There are no restrictions for this procedure.     Follow-Up: At [facility], you and your health needs are our priority.  As part of our continuing mission to provide you with exceptional heart care, we have created designated Provider Care Teams.  These Care Teams include your primary Cardiologist (physician) and Advanced Practice Providers (APPs -  Physician Assistants and Nurse Practitioners) who all work together to provide you with the care you need, when you need it.  We recommend signing up for the patient portal called "MyChart".  Sign up information is provided on this After Visit Summary.  MyChart is used to connect with patients for Virtual Visits (Telemedicine).  Patients are able to view lab/test results, encounter notes, upcoming appointments, etc.  Non-urgent messages can be sent to your provider as well.   To learn more about what you can do with MyChart, go to [email or web address].    Your next appointment:   6 month(s)  Provider:   Shelva Majestic, MD      Signed, Kimberly Majestic, MD  05/05/2022 11:41 AM    [facility] [address] [place], [place], [place]  [address] Phone: [phone]

## 2022-05-01 LAB — LIPID PANEL
Chol/HDL Ratio: 5.6 ratio — ABNORMAL HIGH (ref 0.0–4.4)
Cholesterol, Total: 220 mg/dL — ABNORMAL HIGH (ref 100–199)
HDL: 39 mg/dL — ABNORMAL LOW (ref >39)
LDL Chol Calc (NIH): 150 mg/dL — ABNORMAL HIGH (ref 0–99)
Triglycerides: 172 mg/dL — ABNORMAL HIGH (ref 0–149)
VLDL Cholesterol Cal: 31 mg/dL (ref 5–40)

## 2022-05-01 LAB — CBC
Hematocrit: 43.3 % (ref 34.0–46.6)
Hemoglobin: 14.8 g/dL (ref 11.1–15.9)
MCH: 31 pg (ref 26.6–33.0)
MCHC: 34.2 g/dL (ref 31.5–35.7)
MCV: 91 fL (ref 79–97)
Platelets: 238 10*3/uL (ref 150–450)
RBC: 4.78 x10E6/uL (ref 3.77–5.28)
RDW: 13.5 % (ref 11.7–15.4)
WBC: 10.6 10*3/uL (ref 3.4–10.8)

## 2022-05-01 LAB — COMPREHENSIVE METABOLIC PANEL
ALT: 25 IU/L (ref 0–32)
AST: 20 IU/L (ref 0–40)
Albumin/Globulin Ratio: 2.2 (ref 1.2–2.2)
Albumin: 4.7 g/dL (ref 3.9–4.9)
Alkaline Phosphatase: 214 IU/L — ABNORMAL HIGH (ref 44–121)
BUN/Creatinine Ratio: 13 (ref 9–23)
BUN: 10 mg/dL (ref 6–24)
Bilirubin Total: 0.4 mg/dL (ref 0.0–1.2)
CO2: 22 mmol/L (ref 20–29)
Calcium: 11.6 mg/dL — ABNORMAL HIGH (ref 8.7–10.2)
Chloride: 107 mmol/L — ABNORMAL HIGH (ref 96–106)
Creatinine, Ser: 0.78 mg/dL (ref 0.57–1.00)
Globulin, Total: 2.1 g/dL (ref 1.5–4.5)
Glucose: 77 mg/dL (ref 70–99)
Potassium: 4 mmol/L (ref 3.5–5.2)
Sodium: 143 mmol/L (ref 134–144)
Total Protein: 6.8 g/dL (ref 6.0–8.5)
eGFR: 92 mL/min/{1.73_m2} (ref >59)

## 2022-05-01 LAB — TSH: TSH: 2.92 u[IU]/mL (ref 0.450–4.500)

## 2022-05-01 LAB — HEMOGLOBIN A1C
Est. average glucose Bld gHb Est-mCnc: 111 mg/dL
Hgb A1c MFr Bld: 5.5 % (ref 4.8–5.6)

## 2022-05-02 ENCOUNTER — Other Ambulatory Visit: Payer: Self-pay | Admitting: Hematology and Oncology

## 2022-05-05 ENCOUNTER — Encounter: Payer: Self-pay | Admitting: Cardiovascular Disease

## 2022-05-06 ENCOUNTER — Other Ambulatory Visit (HOSPITAL_COMMUNITY): Payer: Self-pay

## 2022-05-07 ENCOUNTER — Other Ambulatory Visit (HOSPITAL_COMMUNITY): Payer: Self-pay

## 2022-05-07 ENCOUNTER — Telehealth: Payer: Self-pay | Admitting: Cardiovascular Disease

## 2022-05-07 ENCOUNTER — Telehealth: Payer: Self-pay

## 2022-05-07 NOTE — Telephone Encounter (Signed)
Pt c/o medication issue:  1. Name of Medication:   sacubitril-valsartan (ENTRESTO) 49-51 MG  empagliflozin (JARDIANCE) 10 MG TABS tablet (new for patient)  2. How are you currently taking this medication (dosage and times per day)?   As prescribed  3. Are you having a reaction (difficulty breathing--STAT)?   No  4. What is your medication issue?   Patient stated she has not been able to get the Jardiance as the pharmacy told her she needs insurance approval.  Patient stated she has not had Entresto for 2 months.

## 2022-05-07 NOTE — Telephone Encounter (Signed)
Pharmacy Patient Advocate Encounter  Prior Authorization for JARDIANCE 10 MG has been approved.    Effective dates: 05/07/22 through 05/08/23   Received notification from [facility] that prior authorization for JARDIANCE 10 MG is needed.    PA submitted on 05/07/22 Key [id number] Status is pending  Karie Soda, [facility] Patient Advocate Specialist Direct Number: [phone] Fax: [phone]

## 2022-05-07 NOTE — Telephone Encounter (Signed)
Called patient. She states she has not been able to get the Jardiance or the Entresto medication- she was seen in office on 02.06- I will reach out to PA team to see if they can assist, and will see if pharmacy team is able to provide samples?   PHARMD- I am not in office, working remote, can we assist by getting her samples of medications?  Thanks!

## 2022-05-08 NOTE — Telephone Encounter (Signed)
Jardiance approval is on chart in a telephone encounter with approval information. Please see 04/04/2022 encounter regarding Entresto. Looks like someone has an appeal pending.

## 2022-05-08 NOTE — Telephone Encounter (Signed)
**Note De-Identified Analeise Mccleery Obfuscation** The previous Entresto PA/appeal I did was for 97-103 mg but the pts dose was corrected at her last office visit with Dr Claiborne Billings on 04/30/22 to 49-51 mg.  I called [facility] and started a Entresto 49-51 mg PA with Lelon Frohlich over the phone.  As requested by Lelon Frohlich, I have faxed the pts office visit notes from 04/30/22 with Dr Claiborne Billings to [place] at [phone] with PA #: [phone] written on the top of the office notes.  Per Lelon Frohlich they will be faxing their determination to Dr Evette Georges office at [phone] within 24 hours. Fax: Tx 'ok' Report CONE_EMAIL-to-Fax  Kora Groom, Mardene Celeste This message was sent [facility], a product from [employer] [email or web address]/ [employer] pioneered [employer] and continues to innovate the most advanced and intelligent fax and secure messaging solutions for enterprises. www.[email or web address] -------Fax Transmission Report-------  To:               Recipient at [id number] Subject:          Fw: [facility] Result:           The transmission was successful. Explanation:      All Pages Ok Pages Sent:       7 Connect Time:     3 minutes, 2 seconds Transmit Time:    05/08/2022 13:36 Transfer Rate:    14400 Status Code:      0000 Retry Count:      0 Job Id:           [id number] Fax Line:         8 Fax Server:       [employer]

## 2022-05-10 ENCOUNTER — Other Ambulatory Visit: Payer: Self-pay

## 2022-05-10 DIAGNOSIS — E785 Hyperlipidemia, unspecified: Secondary | ICD-10-CM

## 2022-05-10 MED ORDER — EZETIMIBE 10 MG PO TABS
10.0000 mg | ORAL_TABLET | Freq: Every day | ORAL | 3 refills | Status: DC
Start: 2022-05-10 — End: 2023-06-12

## 2022-08-30 ENCOUNTER — Other Ambulatory Visit: Payer: Self-pay | Admitting: Nurse Practitioner

## 2022-08-30 DIAGNOSIS — J441 Chronic obstructive pulmonary disease with (acute) exacerbation: Secondary | ICD-10-CM

## 2022-10-28 ENCOUNTER — Ambulatory Visit (HOSPITAL_COMMUNITY): Payer: Medicaid Other | Attending: Cardiovascular Disease

## 2022-10-28 DIAGNOSIS — Z79899 Other long term (current) drug therapy: Secondary | ICD-10-CM | POA: Diagnosis not present

## 2022-10-28 DIAGNOSIS — I10 Essential (primary) hypertension: Secondary | ICD-10-CM | POA: Insufficient documentation

## 2022-10-28 DIAGNOSIS — I428 Other cardiomyopathies: Secondary | ICD-10-CM | POA: Diagnosis not present

## 2022-10-28 LAB — ECHOCARDIOGRAM COMPLETE
Area-P 1/2: 3.23 cm2
S' Lateral: 2.8 cm

## 2022-11-14 NOTE — Progress Notes (Signed)
Cardiology Office Note    Date:  11/15/2022   ID:  [name], [name] 1970/10/27, MRN [id number]  PCP:  Leonard Downing [phone])  Cardiologist:  Nicki Guadalajara, MD   No chief complaint on file.   6 month F/U office visit   History of Present Illness:  [name] is a 52 y.o. female presents who was last seen by me on February 2024.  She presents for a 6 month  follow-up cardiology evaluation.  [name] has a history of renal vascular hypertension and is status post remote left renal artery stenting, right-sided Bell's palsy in 2001, with residual right-sided facial weakness, and a history of lymphoma the neck.  She suffered a non-ST segment elevation myocardial infarction in June 2017.  Cardiac catheterization by Dr. Herbie Baltimore revealed a 65% stenosis in a tortuous segment and medical therapy was recommended.  She had seen Corine Shelter in follow-up of her hospitalization in July 2017.  She was seen by Fatima Blank, NP for preoperative evaluation prior to breast cancer surgery for which she was needed to be off Plavix.  Subsequently, she was seen by [place].  An ECG showed T-wave inversion V2 through V6 and underwent a nuclear stress test which showed a prior apical infarct and minimal peri-infarct ischemia. EF was estimated at 43% with apical akineis.  I saw her for my initial evaluation with me in the office in January 2019.  At that time she denied any episodes of chest pain or shortness of breath.  On April 01, 2017  she underwent resection of 6 axillary lymph nodes.  She had been off Plavix for several months.   She is now felt to have breast cancer in remission.  She was recently hospitalized from January 17 through April 16, 2019 after presenting with acute hyper cardiac respiratory failure which was sudden in onset regarding the patient.  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  Covid testing was negative.  She self extubated herself on January 18 and was hypertensive.  She was treated with nitrates and Catapres and was started on IV steroids with ultimate transition to prednisone.  Bronchodilators were changed.  During her hypertensive emergency she was seen by cardiology and an echo showed marked reduction in LV function compared to 2017 when her EF was 45 to now being reduced to 25 to 30%.  She was aggressively diuresed and guideline directed medical therapy with Entresto and spironolactone were added to her regimen.  She subsequently underwent right and left heart cardiac catheterization on April 15, 2019 which showed patent coronary arteries and her PA systolic pressure was 33 mm.  I saw her on April 22, 2019 following her hospital discharge.  At that time she was breathing better but still appears of diaphoresis and still experience some mild shortness of breath with activity.  With her continued shortness of breath and blood pressure elevation I recommended slight titration of furosemide to 40 mg daily.  She had been started on Entresto 49/51 mg twice a day for only 2 weeks and at that time did not further titrate Entresto to its maximum dosing.  I recommended follow-up laboratory.  She was evaluated by Micah Flesher, PA on Aug 15, 2020 and [place] [facility] on [place], [place] on September 05, 2020. She had issues with [facility] authorization and ultimately had been out of her medications particularly including Entresto and  Lipitor.  Pharmacy authorization was ultimately obtained.  Due to decreased breath sounds at the base of her lungs she was referred for chest x-ray which was done on September 08, 2020 which was negative for acute cardiopulmonary disease without evidence for central vascular congestion or interlobular septal thickening.  I saw her on November 22, 2020.  At that time she was breathing better and was on Entresto currently 49/51 mg twice a day,   spironolactone 25 mg daily, metoprolol succinate 100 mg and furosemide 40 mg daily.  She was back on atorvastatin 80 mg.  She had a COVID infection in July 2022.  She had recently seen Dr. Sherene Sires and smoking cessation was again advised.  She was started on a prednisone taper and Dulera.  She denied any chest pain or tightness.   She underwent an echo Doppler study on December 18, 2020.  EF was estimated at 50 to 55% and 48% x 3 D volume.  Estimated RV systolic pressure was 33.9.  There is mild to moderate mitral regurgitation.  She apparently had a subsequent echo on January 03, 2021 which showed EF at 45 to 50% with hypokinesis apically.  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 pulmonary.  She has COPD/asthma.  I last saw her in April 30, 2022 at which time she denied any chest pain but at times was experiencing some mild shortness of breath.   She has been taking atorvastatin 80 mg daily for hyperlipidemia and continues to be on Entresto 49/51 twice daily, furosemide 40 mg daily, metoprolol succinate 100 mg, and spironolactone 25 mg daily.  She is on Effexor XR for depression.  She continues to be on Femara.  She continues to be on Trelegy Ellipta and as needed albuterol.   Since I last saw her, she has felt well.  He specifically denies any chest pain or shortness of breath.  She underwent a follow-up echo Doppler study on October 28, 2022 which showed low normal EF, stable at 50 to 55%.  She had normal RV systolic pressure 24.5 mmHg.  There was mild MR.  There  was mild grade 1 diastolic dysfunction.  Has continued to be on atorvastatin 80 mg and Zetia 10 mg for hyperlipidemia.  Laboratory in February 2024 was elevated with total cholesterol 220, HDL 39, LDL 150 and triglycerides 960.  Hemoglobin A1c was 5.5.  Renal function was normal at 0.78.  He continues to see Dr. Ave Filter for her breast CA follow-up.  She presents  for reevaluation.  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   Myocardial infarction [facility]) 2017   Renal artery stenosis (HCC)    s/p stent placment to left    Respiratory failure (HCC) 03/2019    Past Surgical History:  Procedure Laterality Date   AXILLARY LYMPH NODE DISSECTION Right 03/13/2017  Procedure: AXILLARY LYMPH NODE DISSECTION;  Surgeon: Harriette Bouillon, MD;  Location: [facility] OR;  Service: General;  Laterality: Right;   BREAST RECONSTRUCTION WITH PLACEMENT OF TISSUE EXPANDER AND FLEX HD (ACELLULAR HYDRATED DERMIS) Right 01/30/2017   Procedure: RIGHT BREAST RECONSTRUCTION WITH PLACEMENT OF TISSUE EXPANDER AND FLEX HD (ACELLULAR HYDRATED DERMIS);  Surgeon: Peggye Form, DO;  Location: [facility] OR;  Service: Plastics;  Laterality: Right;   BREAST SURGERY     breast bx  07/2016   CARDIAC CATHETERIZATION N/A 09/18/2015   Procedure: Left Heart Cath and Coronary Angiography;  Surgeon: Marykay Lex, MD;  Location: [facility] INVASIVE CV LAB;  Service: Cardiovascular;  Laterality: N/A;   CESAREAN SECTION  1989 & 1991   x2   IR CV LINE INJECTION  07/24/2016   MASTECTOMY WITH RADIOACTIVE SEED GUIDED EXCISION AND AXILLARY SENTINEL LYMPH NODE BIOPSY Right 01/30/2017   Procedure: RIGHT SIMPLE MASTECTOMY WITH  RADIOACTIVE SEED TARGETED RIGHT AXILLARY LYMPH NODE EXCISION AND RIGHT AXILLARY SENTINEL LYMPH NODE BIOPSY;  Surgeon: Harriette Bouillon, MD;  Location: [facility] OR;  Service: General;  Laterality: Right;   PERIPHERAL VASCULAR CATHETERIZATION N/A 08/23/2014   Procedure: Renal Angiography;  Surgeon: Yates Decamp, MD;   Location: [facility] INVASIVE CV LAB;  Service: Cardiovascular;  Laterality: N/A;   RENAL ARTERY STENT Left 08/23/2014   RIGHT/LEFT HEART CATH AND CORONARY ANGIOGRAPHY N/A 04/15/2019   Procedure: RIGHT/LEFT HEART CATH AND CORONARY ANGIOGRAPHY;  Surgeon: Runell Gess, MD;  Location: [facility] INVASIVE CV LAB;  Service: Cardiovascular;  Laterality: N/A;   SIMPLE MASTECTOMY Right 01/30/2017   SKIN BIOPSY  2000   TISSUE EXPANDER PLACEMENT Right 10/08/2017   Procedure: REMOVAL OF RIGHT BREAST TISSUE EXPANDER;  Surgeon: Peggye Form, DO;  Location: [facility] OR;  Service: Plastics;  Laterality: Right;  Case should be 45 min   TUBAL LIGATION      Current Medications: Outpatient Medications Prior to Visit  Medication Sig Dispense Refill   acetaminophen (TYLENOL) 500 MG tablet Take 1,000 mg by mouth every 8 (eight) hours as needed for mild pain or headache.     albuterol (PROVENTIL) (2.5 MG/3ML) 0.083% nebulizer solution Take 3 mLs (2.5 mg total) by nebulization every 6 (six) hours as needed for wheezing or shortness of breath. 75 mL 12   albuterol (VENTOLIN HFA) 108 (90 Base) MCG/ACT inhaler Inhale 2 puffs into the lungs every 6 (six) hours as needed for wheezing or shortness of breath.     aspirin 81 MG chewable tablet Chew 81 mg by mouth daily.     atorvastatin (LIPITOR) 80 MG tablet Take 1 tablet (80 mg total) by mouth daily. 90 tablet 3   cetirizine (ZYRTEC) 10 MG tablet Take 10 mg by mouth daily.     CVS D3 5000 units capsule Take 5,000 Units by mouth daily.  11   empagliflozin (JARDIANCE) 10 MG TABS tablet Take 1 tablet (10 mg total) by mouth daily before breakfast. 30 tablet 3   ezetimibe (ZETIA) 10 MG tablet Take 1 tablet (10 mg total) by mouth daily. 90 tablet 3   furosemide (LASIX) 40 MG tablet Take 1 tablet (40 mg total) by mouth daily. Please attend scheduled appointment for additional refills. 90 tablet 3   letrozole (FEMARA) 2.5 MG tablet TAKE 1 TABLET BY MOUTH EVERY DAY 90 tablet 3   metoprolol  succinate (TOPROL-XL) 100 MG 24 hr tablet Take 1 tablet (100 mg total) by mouth daily. Take with or immediately following a meal. Keep appointment in 2024 for  future refills 90 tablet 3   sacubitril-valsartan (ENTRESTO) 49-51 MG Take 1 tablet by mouth daily. 60 tablet 3   spironolactone (ALDACTONE) 25 MG tablet Take 1 tablet (25 mg total) by mouth daily. 90 tablet 3   TRELEGY ELLIPTA 100-62.5-25 MCG/ACT AEPB TAKE 1 PUFF BY MOUTH EVERY DAY 60 each 5   venlafaxine XR (EFFEXOR-XR) 37.5 MG 24 hr capsule Take 1 capsule (37.5 mg total) by mouth daily with breakfast. 90 capsule 3   nitroGLYCERIN (NITROSTAT) 0.4 MG SL tablet Place 1 tablet (0.4 mg total) under the tongue every 5 (five) minutes as needed for chest pain. (Patient not taking: Reported on 11/15/2022) 25 tablet 3   No facility-administered medications prior to visit.     Allergies:   Compazine [prochlorperazine] and Ondansetron hcl   Social History   Socioeconomic History   Marital status: Single    Spouse name: Not on file   Number of children: 4   Years of education: 9   Highest education level: Not on file  Occupational History   Occupation: Unemployed  Tobacco Use   Smoking status: Every Day    Current packs/day: 0.50    Average packs/day: 0.5 packs/day for 15.0 years (7.5 ttl pk-yrs)    Types: Cigarettes   Smokeless tobacco: Never   Tobacco comments:    5 cigs per day 02/11/22 [facility]  Vaping Use   Vaping status: Never Used  Substance and Sexual Activity   Alcohol use: No    Alcohol/week: 0.0 standard drinks of alcohol   Drug use: No   Sexual activity: Yes    Birth control/protection: None  Other Topics Concern   Not on file  Social History Narrative   Lives at home with fiance and kids   Caffeine use: 2 cups coffee per day   4 Dr. Reino Kent (12oz) per day    Social Determinants of Health   Financial Resource Strain: Medium Risk (06/03/2021)   Received from [facility], [facility]   Overall Financial Resource  Strain (CARDIA)    Difficulty of Paying Living Expenses: Somewhat hard  Food Insecurity: No Food Insecurity (06/03/2021)   Received from [facility], [facility]   Hunger Vital Sign    Worried About Running Out of Food in the Last Year: Never true    Ran Out of Food in the Last Year: Never true  Transportation Needs: No Transportation Needs (06/03/2021)   Received from [facility], [facility]   PRAPARE - Transportation    Lack of Transportation (Medical): No    Lack of Transportation (Non-Medical): No  Physical Activity: Insufficiently Active (06/03/2021)   Received from [facility], [facility]   Exercise Vital Sign    Days of Exercise per Week: 2 days    Minutes of Exercise per Session: 30 min  Stress: Stress Concern Present (06/03/2021)   Received from [facility], [facility] - Occupational Stress Questionnaire    Feeling of Stress : To some extent  Social Connections: Unknown (07/26/2021)   Received from [facility], [facility]   Social Network    Social Network: Not on file  Recent Concern: Social Connections - Moderately Isolated (06/03/2021)   Received from [facility], [facility]   Social Connection and Isolation Panel [NHANES]    Frequency of Communication with Friends and Family: More than three times a week    Frequency of Social Gatherings with Friends and Family: More than three times a week  Attends Religious Services: Never    Active Member of Clubs or Organizations: No    Attends Banker Meetings: Never    Marital Status: Living with partner     Family History:  The patient's family history includes Asthma in her father; Breast cancer in her maternal aunt; Cervical cancer in her mother; Coronary artery disease (age of onset: 22) in her father; Hypertension in her mother; Prostate cancer in her cousin.   ROS General: Negative; No fevers, chills, or night sweats;  HEENT: Negative; No  changes in vision or hearing, sinus congestion, difficulty swallowing Pulmonary: Positive for asthma/COPD Cardiovascular: Negative; No chest pain, presyncope, syncope, palpitations GI: Negative; No nausea, vomiting, diarrhea, or abdominal pain GU: Negative; No dysuria, hematuria, or difficulty voiding Musculoskeletal: Negative; no myalgias, joint pain, or weakness Hematologic/Oncology: Positive for breast CA in remission Endocrine: Negative; no heat/cold intolerance; no diabetes Neuro: Bell's palsy with mild residual right-sided weakness Skin: Negative; No rashes or skin lesions Psychiatric: Negative; No behavioral problems, depression Sleep: Negative; No snoring, daytime sleepiness, hypersomnolence, bruxism, restless legs, hypnogognic hallucinations, no cataplexy Other comprehensive 14 point system review is negative.   PHYSICAL EXAM:   VS:  BP 112/74   Pulse 65   Ht 5\' 3"  (1.6 m)   Wt 173 lb 9.6 oz (78.7 kg)   SpO2 95%   BMI 30.75 kg/m     Repeat blood pressure by me was 140/84  Wt Readings from Last 3 Encounters:  11/15/22 173 lb 9.6 oz (78.7 kg)  04/30/22 179 lb 6.4 oz (81.4 kg)  02/11/22 176 lb 9.6 oz (80.1 kg)    General: Alert, oriented, no distress.  Skin: normal turgor, no rashes, warm and dry HEENT: Normocephalic, atraumatic. Pupils equal round and reactive to light; sclera anicteric; extraocular muscles intact; Nose without nasal septal hypertrophy Mouth/Parynx benign; Mallinpatti scale 3 Neck: No JVD, no carotid bruits; normal carotid upstroke Lungs: clear to ausculatation and percussion; no wheezing or rales Chest wall: without tenderness to palpitation Heart: PMI not displaced, RRR, s1 s2 normal, 1/6 systolic murmur, no diastolic murmur, no rubs, gallops, thrills, or heaves Abdomen: soft, nontender; no hepatosplenomehaly, BS+; abdominal aorta nontender and not dilated by palpation. Back: no CVA tenderness Pulses 2+ Musculoskeletal: full range of motion, normal  strength, no joint deformities Extremities: no clubbing cyanosis or edema, Homan's sign negative  Neurologic: grossly nonfocal; Cranial nerves grossly wnl Psychologic: Normal mood and affect   Studies/Labs Reviewed:   EKG Interpretation Date/Time:  Friday November 15 2022 10:10:14 EDT Ventricular Rate:  65 PR Interval:  132 QRS Duration:  94 QT Interval:  424 QTC Calculation: 440 R Axis:   43  Text Interpretation: Normal sinus rhythm Possible Inferior infarct , age undetermined When compared with ECG of 12-Apr-2019 15:09, Vent. rate has decreased BY  59 BPM Criteria for Anterior infarct are no longer Present Nonspecific T wave abnormality now evident in Anterolateral leads Confirmed by Nicki Guadalajara ([id number]) on 11/15/2022 10:33:22 AM    April 30, 2022 ECG (independently read by me): NSR at 70, small Q waves inferiorly, PRWP V1-3, T wave abnormality  November 22, 2020 ECG (independently read by me):  NSR at 71;small Q waves inferiorly, PRWP V1-28 March 2019 ECG (independently read by me): Normal sinus rhythm at 74 bpm.  Q waves inferiorly.  QTc interval increased at 479 ms.  No ectopy.  T wave abnormality in lead aVL  January 2019 ECG (independently read by me): Normal sinus rhythm at 66 bpm.  Poor anterior R-wave progression V1 to V3 with nonspecific T changes.  Normal intervals.  Recent Labs:    Latest Ref Rng & Units 04/30/2022   10:27 AM 06/21/2021   12:27 PM 11/13/2020   11:25 AM  BMP  Glucose 70 - 99 mg/dL 77  92  78   BUN 6 - 24 mg/dL 10  8  5    Creatinine 0.57 - 1.00 mg/dL 1.30  8.65  7.84   BUN/Creat Ratio 9 - 23 13     Sodium 134 - 144 mmol/L 143  140  142   Potassium 3.5 - 5.2 mmol/L 4.0  3.8  3.8   Chloride 96 - 106 mmol/L 107  110  108   CO2 20 - 29 mmol/L 22  23  27    Calcium 8.7 - 10.2 mg/dL 69.6  29.5  28.4         Latest Ref Rng & Units 04/30/2022   10:27 AM 06/21/2021   12:27 PM 08/25/2020   11:24 AM  Hepatic Function  Total Protein 6.0 - 8.5 g/dL 6.8  7.2   6.5   Albumin 3.9 - 4.9 g/dL 4.7  4.4  4.5   AST 0 - 40 IU/L 20  23  21    ALT 0 - 32 IU/L 25  20  29    Alk Phosphatase 44 - 121 IU/L 214  182  220   Total Bilirubin 0.0 - 1.2 mg/dL 0.4  0.6  0.4        Latest Ref Rng & Units 04/30/2022   10:27 AM 06/21/2021   12:27 PM 11/13/2020   11:25 AM  CBC  WBC 3.4 - 10.8 x10E3/uL 10.6  8.7  9.5   Hemoglobin 11.1 - 15.9 g/dL 13.2  44.0  10.2   Hematocrit 34.0 - 46.6 % 43.3  44.3  41.7   Platelets 150 - 450 x10E3/uL 238  251  226.0    Lab Results  Component Value Date   MCV 91 04/30/2022   MCV 90.2 06/21/2021   MCV 93.6 11/13/2020   Lab Results  Component Value Date   TSH 2.920 04/30/2022   Lab Results  Component Value Date   HGBA1C 5.5 04/30/2022     BNP    Component Value Date/Time   BNP 28.8 08/25/2020 1124   BNP 348.6 (H) 04/11/2019 0520    ProBNP    Component Value Date/Time   PROBNP 37.0 11/13/2020 1125     Lipid Panel     Component Value Date/Time   CHOL 220 (H) 04/30/2022 1027   TRIG 172 (H) 04/30/2022 1027   HDL 39 (L) 04/30/2022 1027   CHOLHDL 5.6 (H) 04/30/2022 1027   CHOLHDL 3.9 09/18/2015 0340   VLDL 25 09/18/2015 0340   LDLCALC 150 (H) 04/30/2022 1027     RADIOLOGY: ECHOCARDIOGRAM COMPLETE  Result Date: 10/28/2022    ECHOCARDIOGRAM REPORT   Patient Name:   HASEL HRUZA Date of Exam: 10/28/2022 Medical Rec #:  [id number]           Height:       63.0 in Accession #:    [id number]          Weight:       179.4 lb Date of Birth:  1971/02/29           BSA:          1.846 m Patient Age:    51 years  BP:           133/93 mmHg Patient Gender: F                   HR:           79 bpm. Exam Location:  [address] Procedure: 2D Echo, 3D Echo, Cardiac Doppler, Color Doppler and Strain Analysis Indications:    I42.8 Nonischemic cardiomyopathy  History:        Patient has prior history of Echocardiogram examinations, most                 recent 01/03/2021. Nonischemic cardiomyopathy, CAD and Previous                  Myocardial Infarction, Breast cancer, Status post right                 mastectomy with reconstruction. status post renal stent,                 Signs/Symptoms:1/6 systolic murmur; Risk Factors:Current Smoker.                 Status post revealed LVEF 55%, 3D EF 48% and PAP 33.9 mmHg.  Sonographer:    Chanetta Marshall BA, RDCS Referring Phys: [phone] Annica Marinello A Raenell Mensing IMPRESSIONS  1. Left ventricular ejection fraction, by estimation, is 50 to 55%. Left ventricular ejection fraction by 3D volume is 53 %. The left ventricle has low normal function. The left ventricle has no regional wall motion abnormalities. Left ventricular diastolic parameters are consistent with Grade I diastolic dysfunction (impaired relaxation). The average left ventricular global longitudinal strain is -18.4 %. The global longitudinal strain is abnormal.  2. Right ventricular systolic function is low normal. The right ventricular size is normal. There is normal pulmonary artery systolic pressure. The estimated right ventricular systolic pressure is 24.5 mmHg.  3. The mitral valve is abnormal. Mild mitral valve regurgitation.  4. The aortic valve is tricuspid. Aortic valve regurgitation is not visualized.  5. The inferior vena cava is normal in size with greater than 50% respiratory variability, suggesting right atrial pressure of 3 mmHg. Comparison(s): Changes from prior study are noted. 01/03/2021: LVEF 45-50%. FINDINGS  Left Ventricle: Left ventricular ejection fraction, by estimation, is 50 to 55%. Left ventricular ejection fraction by 3D volume is 53 %. The left ventricle has low normal function. The left ventricle has no regional wall motion abnormalities. The average left ventricular global longitudinal strain is -18.4 %. The global longitudinal strain is abnormal. The left ventricular internal cavity size was normal in size. There is no left ventricular hypertrophy. Left ventricular diastolic parameters are consistent with Grade I  diastolic dysfunction (impaired relaxation). Indeterminate filling pressures. Right Ventricle: The right ventricular size is normal. No increase in right ventricular wall thickness. Right ventricular systolic function is low normal. There is normal pulmonary artery systolic pressure. The tricuspid regurgitant velocity is 2.32 m/s,  and with an assumed right atrial pressure of 3 mmHg, the estimated right ventricular systolic pressure is 24.5 mmHg. Left Atrium: Left atrial size was normal in size. Right Atrium: Right atrial size was normal in size. Pericardium: There is no evidence of pericardial effusion. Mitral Valve: The mitral valve is abnormal. There is mild calcification of the anterior and posterior mitral valve leaflet(s). Mild mitral valve regurgitation, with centrally-directed jet. Tricuspid Valve: The tricuspid valve is grossly normal. Tricuspid valve regurgitation is trivial. Aortic Valve: The aortic valve is tricuspid. Aortic valve regurgitation is  not visualized. Pulmonic Valve: The pulmonic valve was normal in structure. Pulmonic valve regurgitation is not visualized. Aorta: The aortic root and ascending aorta are structurally normal, with no evidence of dilitation. Venous: The inferior vena cava is normal in size with greater than 50% respiratory variability, suggesting right atrial pressure of 3 mmHg. IAS/Shunts: No atrial level shunt detected by color flow Doppler.  LEFT VENTRICLE PLAX 2D LVIDd:         4.60 cm         Diastology LVIDs:         2.80 cm         LV e' medial:    5.65 cm/s LV PW:         0.90 cm         LV E/e' medial:  12.9 LV IVS:        0.90 cm         LV e' lateral:   8.05 cm/s LVOT diam:     2.00 cm         LV E/e' lateral: 9.0 LV SV:         54 LV SV Index:   29              2D LVOT Area:     3.14 cm        Longitudinal                                Strain                                2D Strain GLS  -17.3 %                                (A2C):                                 2D Strain GLS  -19.7 %                                (A3C):                                2D Strain GLS  -18.1 %                                (A4C):                                2D Strain GLS  -18.4 %                                Avg:                                 3D Volume EF  LV 3D EF:    Left                                             ventricul                                             ar                                             ejection                                             fraction                                             by 3D                                             volume is                                             53 %.                                 3D Volume EF:                                3D EF:        53 %                                LV EDV:       113 ml                                LV ESV:       54 ml                                LV SV:        60 ml RIGHT VENTRICLE             IVC RV Basal diam:  2.90 cm     IVC diam: 1.20 cm RV Mid diam:    2.30 cm RV S prime:     10.10 cm/s TAPSE (M-mode): 1.9 cm RVSP:           24.5 mmHg LEFT ATRIUM  Index        RIGHT ATRIUM           Index LA diam:        3.90 cm 2.11 cm/m   RA Pressure: 3.00 mmHg LA Vol (A2C):   30.0 ml 16.25 ml/m  RA Area:     12.90 cm LA Vol (A4C):   27.1 ml 14.68 ml/m  RA Volume:   31.20 ml  16.90 ml/m LA Biplane Vol: 28.7 ml 15.54 ml/m  AORTIC VALVE LVOT Vmax:   101.00 cm/s LVOT Vmean:  63.400 cm/s LVOT VTI:    0.172 m  AORTA Ao Root diam: 2.80 cm Ao Asc diam:  2.90 cm MITRAL VALVE               TRICUSPID VALVE MV Area (PHT): 3.23 cm    TR Peak grad:   21.5 mmHg MV Decel Time: 235 msec    TR Vmax:        232.00 cm/s MV E velocity: 72.80 cm/s  Estimated RAP:  3.00 mmHg MV A velocity: 96.90 cm/s  RVSP:           24.5 mmHg MV E/A ratio:  0.75                            SHUNTS                            Systemic VTI:  0.17 m                             Systemic Diam: 2.00 cm Zoila Shutter MD Electronically signed by Zoila Shutter MD Signature Date/Time: 10/28/2022/12:10:46 PM    Final       Additional studies/ records that were reviewed today include:   I extensively reviewed her most recent hospitalization, right and left heart cardiac catheterization, as well as echo data. Evaluations of Azalee Course and Micah Flesher were reviewed and most recent Dr. Sherene Sires pulmonary evaluation.  ECHO: 10/28/2022  1. Left ventricular ejection fraction, by estimation, is 50 to 55%. Left  ventricular ejection fraction by 3D volume is 53 %. The left ventricle has  low normal function. The left ventricle has no regional wall motion  abnormalities. Left ventricular  diastolic parameters are consistent with Grade I diastolic dysfunction  (impaired relaxation). The average left ventricular global longitudinal  strain is -18.4 %. The global longitudinal strain is abnormal.   2. Right ventricular systolic function is low normal. The right  ventricular size is normal. There is normal pulmonary artery systolic  pressure. The estimated right ventricular systolic pressure is 24.5 mmHg.   3. The mitral valve is abnormal. Mild mitral valve regurgitation.   4. The aortic valve is tricuspid. Aortic valve regurgitation is not  visualized.   5. The inferior vena cava is normal in size with greater than 50%  respiratory variability, suggesting right atrial pressure of 3 mmHg.   Comparison(s): Changes from prior study are noted. 01/03/2021: LVEF  45-50%.   ASSESSMENT:    1. Nonischemic cardiomyopathy (HCC)   2. CAD at cath 09/18/15   3. Renovascular hypertension: s/p left renal artery stenting   4. Hyperlipidemia LDL goal <70   5. Mild intermittent asthma without complication   6. Bell's palsy   7. History of breast cancer     PLAN:  Ms.  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  She suffered a non-ST segment elevation MI in June 2017.  Cardiac catheterization revealed a  65% stenosis in a very tortuous segment for which medical therapy was recommended.  Troponin was mildly elevated at 1.57.   She was subsequently found to have breast CA which is now felt to be in remission.  She was hospitalized in January 2021 with acute hypercarbic respiratory failure requiring intubation.  An echo Doppler study during that evaluation showed an EF of 25 to 30% with grade 2 diastolic dysfunction.  There was mild elevation of right heart pressures.  Cardiac catheterization did not demonstrate any significant coronary obstructive disease and she had mild increased PA systolic pressure at 33 mm.  During her hospitalization she was started on Entresto 49/51 mg twice a day, spironolactone 25 mg daily, Toprol-XL 50 mg twice a day and has also been on furosemide 20 mg.  With  continued shortness of breath and blood pressure elevation at her evaluation with me January 2021 I recommended  slight titration of furosemide to 40 mg.  Her   2D echo Doppler study  in October 2022 showed an EF of 45 to 50%.  The LV apex was hypokinetic.  There was no obvious thrombus.  Her most recent echo just completed on October 28, 2022 shows EF again now at 50 to 55%.  She had normal RV pressure.  There was grade 1 diastolic dysfunction, mild MR.  She had a slightly abnormal global strain pattern.  Her blood pressure today is excellent at 118/76.  She denies any shortness of breath or exertional dyspnea and continues to be on guideline directed medical therapy with Entresto 49/51 mg twice a day, Jardiance 10 mg daily, furosemide 40 mg, metoprolol succinate 100 mg in addition to spironolactone 25 mg daily.  She continues to be on atorvastatin 80 mg and Zetia 10 mg.  With her lipid studies elevated in February 2024 I have recommended a follow-up  comprehensive metabolic panel and fasting lipid studies.  If LDL continues to be elevated she most likely will need initiation of Repatha or other therapy.  She denies any wheezing and continues to be on Trelegy Ellipta.  She is on aspirin.  Following up with Romie Jumper, PA-C her primary provider at [facility].  She will follow-up with Dr. Ave Filter with her history of breast CVA.  He continues to be followed by [facility] pulmonology.  I will see her in 6 to 7 months for follow-up evaluation or sooner as needed.    Medication Adjustments/Labs and Tests Ordered: Current medicines are reviewed at length with the patient today.  Concerns regarding medicines are outlined above.  Medication changes, Labs and Tests ordered today are listed in the Patient Instructions below. Patient Instructions  Medication Instructions:  *If you need a refill on your cardiac medications before your next appointment, please call your pharmacy*   Lab Work: Return for fasting labs. LIPID, CMET If you have labs (blood work) drawn today and your tests are completely normal, you will receive your results only by: MyChart Message (if you have MyChart) OR A paper copy in the mail If you have any lab test that is abnormal or we need to change your treatment, we will call you to review the results.    Follow-Up: At  [facility], you and your health needs are our priority.  As part of our continuing mission to provide you with exceptional heart care, we have created designated Provider Care Teams.  These Care Teams include your primary Cardiologist (physician) and Advanced Practice Providers (APPs -  Physician Assistants and Nurse Practitioners) who all work together to provide you with the care you need, when you need it.  We recommend signing up for the patient portal called "MyChart".  Sign up information is provided on this After Visit Summary.  MyChart is used to connect with patients for Virtual Visits (Telemedicine).   Patients are able to view lab/test results, encounter notes, upcoming appointments, etc.  Non-urgent messages can be sent to your provider as well.   To learn more about what you can do with MyChart, go to [email or web address].    Your next appointment:   7 month(s)  A letter will be mailed to you as a reminder to call the office for your follow up appointment.  Provider:   Nicki Guadalajara, MD       Signed, Nicki Guadalajara, MD  11/15/2022 10:55 AM    [facility] [address], [place], [place]  [address] Phone: [phone]

## 2022-11-15 ENCOUNTER — Encounter: Payer: Self-pay | Admitting: Cardiovascular Disease

## 2022-11-15 ENCOUNTER — Ambulatory Visit: Payer: Medicaid Other | Attending: Cardiovascular Disease | Admitting: Cardiovascular Disease

## 2022-11-15 DIAGNOSIS — E785 Hyperlipidemia, unspecified: Secondary | ICD-10-CM | POA: Diagnosis not present

## 2022-11-15 DIAGNOSIS — I2510 Atherosclerotic heart disease of native coronary artery without angina pectoris: Secondary | ICD-10-CM

## 2022-11-15 DIAGNOSIS — Z853 Personal history of malignant neoplasm of breast: Secondary | ICD-10-CM

## 2022-11-15 DIAGNOSIS — J4520 Mild intermittent asthma, uncomplicated: Secondary | ICD-10-CM

## 2022-11-15 DIAGNOSIS — I428 Other cardiomyopathies: Secondary | ICD-10-CM

## 2022-11-15 DIAGNOSIS — I150 Renovascular hypertension: Secondary | ICD-10-CM

## 2022-11-15 DIAGNOSIS — G510 Bell's palsy: Secondary | ICD-10-CM

## 2022-11-15 NOTE — Patient Instructions (Addendum)
Medication Instructions:  *If you need a refill on your cardiac medications before your next appointment, please call your pharmacy*   Lab Work: Return for fasting labs. LIPID, CMET If you have labs (blood work) drawn today and your tests are completely normal, you will receive your results only by: MyChart Message (if you have MyChart) OR A paper copy in the mail If you have any lab test that is abnormal or we need to change your treatment, we will call you to review the results.    Follow-Up: At [facility], you and your health needs are our priority.  As part of our continuing mission to provide you with exceptional heart care, we have created designated Provider Care Teams.  These Care Teams include your primary Cardiologist (physician) and Advanced Practice Providers (APPs -  Physician Assistants and Nurse Practitioners) who all work together to provide you with the care you need, when you need it.  We recommend signing up for the patient portal called "MyChart".  Sign up information is provided on this After Visit Summary.  MyChart is used to connect with patients for Virtual Visits (Telemedicine).  Patients are able to view lab/test results, encounter notes, upcoming appointments, etc.  Non-urgent messages can be sent to your provider as well.   To learn more about what you can do with MyChart, go to [email or web address].    Your next appointment:   7 month(s)  A letter will be mailed to you as a reminder to call the office for your follow up appointment.  Provider:   Nicki Guadalajara, MD

## 2023-03-15 ENCOUNTER — Other Ambulatory Visit: Payer: Self-pay | Admitting: Nurse Practitioner

## 2023-03-15 DIAGNOSIS — J441 Chronic obstructive pulmonary disease with (acute) exacerbation: Secondary | ICD-10-CM

## 2023-05-05 ENCOUNTER — Ambulatory Visit (INDEPENDENT_AMBULATORY_CARE_PROVIDER_SITE_OTHER): Payer: Medicaid Other

## 2023-05-05 ENCOUNTER — Encounter: Payer: Self-pay | Admitting: Nurse Practitioner

## 2023-05-05 ENCOUNTER — Ambulatory Visit (INDEPENDENT_AMBULATORY_CARE_PROVIDER_SITE_OTHER): Payer: Medicaid Other | Admitting: Nurse Practitioner

## 2023-05-05 VITALS — BP 122/78 | HR 64 | Ht 63.0 in | Wt 166.0 lb

## 2023-05-05 DIAGNOSIS — J441 Chronic obstructive pulmonary disease with (acute) exacerbation: Secondary | ICD-10-CM | POA: Diagnosis not present

## 2023-05-05 DIAGNOSIS — F17210 Nicotine dependence, cigarettes, uncomplicated: Secondary | ICD-10-CM

## 2023-05-05 MED ORDER — TRELEGY ELLIPTA 100-62.5-25 MCG/ACT IN AEPB: 1.0000 | INHALATION_SPRAY | Freq: Every day | RESPIRATORY_TRACT | 5 refills | Status: DC

## 2023-05-05 MED ORDER — PREDNISONE 20 MG PO TABS: 40.0000 mg | ORAL_TABLET | Freq: Every day | ORAL | 0 refills | Status: AC

## 2023-05-05 NOTE — Patient Instructions (Addendum)
 Continue Albuterol  inhaler 2 puffs or 3 mL neb every 6 hours as needed for shortness of breath or wheezing. Notify if symptoms persist despite rescue inhaler/neb use. Continue Trelegy 1 puff daily. Brush tongue and rinse mouth afterwards  Prednisone  40 mg daily for 5 days. Take in AM with food  Chest x ray today  Referred to lung cancer screening program  Work on quitting smoking!  Follow up in 4 months with Dr. Waymond Hailey or Gina Lagos. If symptoms do not improve or worsen, please contact office for sooner follow up or seek emergency care.

## 2023-05-05 NOTE — Assessment & Plan Note (Signed)
 Mild exacerbation. Lung exam with bronchospasm. Will treat her with prednisone  burst. No significant sputum production so will hold off on antimicrobial therapy unless evidence of infection on CXR today. Continue scheduled bronchodilator regimen. Action plan in place. Trigger prevention. Smoking cessation strongly advised.  Patient Instructions  Continue Albuterol  inhaler 2 puffs or 3 mL neb every 6 hours as needed for shortness of breath or wheezing. Notify if symptoms persist despite rescue inhaler/neb use. Continue Trelegy 1 puff daily. Brush tongue and rinse mouth afterwards  Prednisone  40 mg daily for 5 days. Take in AM with food  Chest x ray today  Referred to lung cancer screening program  Work on quitting smoking!  Follow up in 4 months with Dr. Waymond Hailey or Gina Lagos. If symptoms do not improve or worsen, please contact office for sooner follow up or seek emergency care.

## 2023-05-05 NOTE — Assessment & Plan Note (Signed)
 Approx 24 pack year hx. Refer to lung cancer screening program. Smoking cessation advised.

## 2023-05-05 NOTE — Progress Notes (Signed)
 @Patient  ID: [name], female    DOB: 1970/09/18, 53 y.o.   MRN: [id number]  Chief Complaint  Patient presents with   Follow-up    Pt states no new concerns at this time    Referring provider: Mendel Stain  HPI: 53 year old female, active smoker followed for COPD/asthma.  She is a patient Dr. Jacqui Mau and last seen in office 02/11/2022 by [facility] NP.  Past medical history significant for hypertension, left renal artery stenosis, history of NSTEMI, CAD, CHF, GERD, Bell's palsy, history of right breast cancer stage IIb.   TEST/EVENTS:  12/25/2020 PFT: FVC 75, FEV1 58, ratio 55, TLC 101, DLCOcor 59 01/03/2021 echocardiogram: EF 45 to 50% with hypokinesis.  Left atrial dilatation.  Mild MR.  RV size and function normal. 02/11/2022 CXR: mild diffuse b/l interstitial pulmonary opacity, may reflect atypical/viral infection or edema  02/05/2021: OV with Dr. Waymond Hailey.  Doing well on Trelegy.  Able to do treadmill up to 35 minutes x 3 days a week.  No orthopnea.  Still smoking.  Follow-up 12 months  02/11/2022: OV with Lynita Groseclose NP for yearly follow-up.  She ran out of Trelegy about a month ago.  Unfortunately, she has had some worsening respiratory symptoms since.  Feels like her breathing has been more short and she is having a frequent, productive cough with yellow sputum.  She also been wheezing more.  This has been ongoing for the past 3 weeks or so, since she came off the Trelegy.  She is also had some right-sided chest discomfort which occasionally radiates from her neck down.  Notices that the pain is worse when she lays flat.  No exertional trigger.  Does not seem to be worse after eating.  She has not tried any nitroglycerin  for this.  Denies any palpitations, dizziness, lightheadedness, PND, orthopnea, leg swelling.  She thinks that it is primarily related to her COPD/asthma.  Has not talked her cardiologist about this.  Currently using albuterol  few times a day.  Denies any fevers,  chills, hemoptysis, calf pain.   05/05/2023: Today - follow up Patient presents today for overdue follow up. She has been doing relatively well since her last visit. She hasn't had any flare ups requiring steroids or abx. She did develop a cold about a month ago and has had more of a dry cough since then. She does have some chest tightness and wheezing as well. No fevers, chills, hemoptysis, leg swelling. She's been having to use her albuterol  more - 2 x/day. She's still using her Trelegy daily. She is still smoking - about 5 cigarettes a day now.   Allergies  Allergen Reactions   Compazine  [Prochlorperazine ] Anaphylaxis, Swelling and Other (See Comments)    Throat swelling   Ondansetron  Hcl Anaphylaxis, Swelling and Other (See Comments)    Throat swelling per patient     There is no immunization history on file for this patient.  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   Myocardial infarction [facility]) 2017   Renal artery stenosis (HCC)    s/p stent placment to left    Respiratory failure (HCC) 03/2019    Tobacco History: Social History   Tobacco Use  Smoking Status Every Day   Current packs/day: 0.75   Average packs/day: 0.8 packs/day for 33.1 years (24.8 ttl pk-yrs)   Types: Cigarettes   Start date: 1992  Smokeless Tobacco Never  Tobacco Comments   5 cigs per day 02/11/22 [facility]   Ready to quit: Not Answered Counseling given: Not Answered Tobacco comments: 5 cigs per day 02/11/22 [facility]   Outpatient Medications Prior to Visit  Medication Sig Dispense Refill   acetaminophen  (TYLENOL ) 500  MG tablet Take 1,000 mg by mouth every 8 (eight) hours as needed for mild pain or headache.     albuterol  (PROVENTIL ) (2.5 MG/3ML) 0.083% nebulizer solution Take 3 mLs (2.5 mg total) by nebulization every 6 (six) hours as needed for wheezing or shortness of breath. 75 mL 12   albuterol  (VENTOLIN  HFA) 108 (90 Base) MCG/ACT inhaler Inhale 2 puffs into the lungs every 6 (six) hours as needed for wheezing or shortness of breath.     aspirin  81 MG chewable tablet Chew 81 mg by mouth daily.     atorvastatin  (LIPITOR ) 80 MG tablet Take 1 tablet (80 mg total) by mouth daily. 90 tablet 3   cetirizine (ZYRTEC) 10 MG tablet Take 10 mg by mouth daily.     [employer] D3 5000 units capsule Take 5,000 Units by mouth daily.  11   empagliflozin  (JARDIANCE ) 10 MG TABS tablet Take 1 tablet (10 mg total) by mouth daily before breakfast. 30 tablet 3   ezetimibe  (ZETIA ) 10 MG tablet Take 1 tablet (10 mg total) by mouth daily. 90 tablet 3   furosemide  (LASIX ) 40 MG tablet Take 1 tablet (40 mg total) by mouth daily. Please attend scheduled appointment for additional refills. 90 tablet 3   letrozole  (FEMARA ) 2.5 MG tablet TAKE 1 TABLET BY MOUTH EVERY DAY 90 tablet 3   metoprolol  succinate (TOPROL -XL) 100 MG 24 hr tablet Take 1 tablet (100 mg total) by mouth daily. Take with or immediately following a meal. Keep appointment in 2024 for future refills 90 tablet 3   nitroGLYCERIN  (NITROSTAT ) 0.4 MG SL tablet Place 1 tablet (0.4 mg total) under the tongue every 5 (five) minutes as needed for chest pain. 25 tablet 3   sacubitril -valsartan  (ENTRESTO ) 49-51 MG Take 1 tablet by mouth daily. 60 tablet 3   spironolactone  (ALDACTONE ) 25 MG tablet Take 1 tablet (25 mg total) by mouth daily. 90 tablet 3   venlafaxine  XR (EFFEXOR -XR) 37.5 MG 24 hr capsule Take 1 capsule (37.5 mg total) by mouth daily with breakfast. 90 capsule 3   Fluticasone-Umeclidin-Vilant (TRELEGY ELLIPTA ) 100-62.5-25 MCG/ACT AEPB INHALE 1 PUFF BY MOUTH EVERY DAY 60 each 2    No facility-administered medications prior to visit.     Review of Systems:   Constitutional: No weight loss or gain, night sweats, fevers, chills, fatigue, or lassitude. HEENT: No headaches, difficulty swallowing, tooth/dental problems, or sore throat. No sneezing, itching, ear ache, nasal congestion, or post nasal drip CV: . No chest pain, orthopnea, PND, swelling in lower extremities, anasarca, dizziness, palpitations, syncope Resp: +shortness of breath with exertion; wheezing; dry cough; chest tightness. No hemoptysis. No chest wall deformity GI:  No heartburn, indigestion, abdominal pain, nausea, vomiting, diarrhea, change in bowel habits, loss of appetite, bloody stools.  GU: No  dysuria, change in color of urine, urgency or frequency.  No flank pain, no hematuria  Skin: No rash, lesions, ulcerations MSK:  No joint pain or swelling.  No decreased range of motion.  No back pain. Neuro: No dizziness or lightheadedness.  Psych: No depression or anxiety. Mood stable.     Physical Exam:  BP 122/78 (BP Location: Left Arm, Patient Position: Sitting, Cuff Size: Normal)   Pulse 64   Ht 5\' 3"  (1.6 m)   Wt 166 lb (75.3 kg)   SpO2 97%   BMI 29.41 kg/m   GEN: Pleasant, interactive, well-appearing; obese; in no acute distress. HEENT:  Normocephalic and atraumatic. PERRLA. Sclera white. Nasal turbinates pink, moist and patent bilaterally. No rhinorrhea present. Oropharynx pink and moist, without exudate or edema. No lesions, ulcerations, or postnasal drip.  NECK:  Supple w/ fair ROM. No JVD present. Normal carotid impulses w/o bruits. Thyroid  symmetrical with no goiter or nodules palpated. No lymphadenopathy.   CV: RRR, no m/r/g, no peripheral edema. Pulses intact, +2 bilaterally. No cyanosis, pallor or clubbing. PULMONARY:  Unlabored, regular breathing. Diminished bibasilar airflow; expiratory wheezes b/l. No accessory muscle use.  GI: BS present and normoactive. Soft, non-tender to  palpation. No organomegaly or masses detected.  MSK: No erythema, warmth or tenderness. Cap refil <2 sec all extrem. No deformities or joint swelling noted.  Neuro: A/Ox3. No focal deficits noted.   Skin: Warm, no lesions or rashe Psych: Normal affect and behavior. Judgement and thought content appropriate.     Lab Results:  CBC    Component Value Date/Time   WBC 10.6 04/30/2022 1027   WBC 8.7 06/21/2021 1227   WBC 9.5 11/13/2020 1125   RBC 4.78 04/30/2022 1027   RBC 4.91 06/21/2021 1227   HGB 14.8 04/30/2022 1027   HGB 11.5 (L) 10/11/2016 0955   HCT 43.3 04/30/2022 1027   HCT 34.8 10/11/2016 0955   PLT 238 04/30/2022 1027   MCV 91 04/30/2022 1027   MCV 93.8 10/11/2016 0955   MCH 31.0 04/30/2022 1027   MCH 30.3 06/21/2021 1227   MCHC 34.2 04/30/2022 1027   MCHC 33.6 06/21/2021 1227   RDW 13.5 04/30/2022 1027   RDW 17.7 (H) 10/11/2016 0955   LYMPHSABS 2.9 06/21/2021 1227   LYMPHSABS 2.9 10/11/2016 0955   MONOABS 0.5 06/21/2021 1227   MONOABS 2.0 (H) 10/11/2016 0955   EOSABS 0.1 06/21/2021 1227   EOSABS 0.0 10/11/2016 0955   BASOSABS 0.1 06/21/2021 1227   BASOSABS 0.0 10/11/2016 0955    BMET    Component Value Date/Time   NA 143 04/30/2022 1027   NA 142 10/11/2016 0955   K 4.0 04/30/2022 1027   K 3.2 (L) 10/11/2016 0955   CL 107 (H) 04/30/2022 1027   CO2 22 04/30/2022 1027   CO2 23 10/11/2016 0955   GLUCOSE 77 04/30/2022 1027   GLUCOSE 92 06/21/2021 1227   GLUCOSE 84 10/11/2016 0955   BUN 10 04/30/2022 1027   BUN 6.8 (L) 10/11/2016 0955   CREATININE 0.78 04/30/2022 1027   CREATININE 0.86 06/21/2021 1227   CREATININE 0.7 10/11/2016 0955   CALCIUM  11.6 (H) 04/30/2022 1027   CALCIUM  9.5 10/11/2016 0955   GFRNONAA >60 06/21/2021 1227   GFRAA 103 07/02/2019 1215    BNP    Component Value Date/Time   BNP 28.8 08/25/2020 1124   BNP 348.6 (H) 04/11/2019 0520     Imaging:  No results found.  Administration History     None  Latest Ref  Rng & Units 12/25/2020   12:49 PM  PFT Results  FVC-Pre L 2.11   FVC-Predicted Pre % 75   FVC-Post L 2.35   FVC-Predicted Post % 83   Pre FEV1/FVC % % 63   Post FEV1/FCV % % 55   FEV1-Pre L 1.33   FEV1-Predicted Pre % 58   FEV1-Post L 1.30   DLCO uncorrected ml/min/mmHg 10.52   DLCO UNC% % 51   DLCO corrected ml/min/mmHg 10.52   DLCO COR %Predicted % 51   DLVA Predicted % 59   TLC L 4.97   TLC % Predicted % 101   RV % Predicted % 138     No results found for: "NITRICOXIDE"      Assessment & Plan:   Asthma with COPD with exacerbation (HCC) Mild exacerbation. Lung exam with bronchospasm. Will treat her with prednisone  burst. No significant sputum production so will hold off on antimicrobial therapy unless evidence of infection on CXR today. Continue scheduled bronchodilator regimen. Action plan in place. Trigger prevention. Smoking cessation strongly advised.  Patient Instructions  Continue Albuterol  inhaler 2 puffs or 3 mL neb every 6 hours as needed for shortness of breath or wheezing. Notify if symptoms persist despite rescue inhaler/neb use. Continue Trelegy 1 puff daily. Brush tongue and rinse mouth afterwards  Prednisone  40 mg daily for 5 days. Take in AM with food  Chest x ray today  Referred to lung cancer screening program  Work on quitting smoking!  Follow up in 4 months with Dr. Waymond Hailey or Gina Lagos. If symptoms do not improve or worsen, please contact office for sooner follow up or seek emergency care.    Cigarette smoker Approx 24 pack year hx. Refer to lung cancer screening program. Smoking cessation advised.     I spent 35 minutes of dedicated to the care of this patient on the date of this encounter to include pre-visit review of records, face-to-face time with the patient discussing conditions above, post visit ordering of testing, clinical documentation with the electronic health record, making appropriate referrals as documented, and communicating  necessary findings to members of the patients care team.  Roetta Clarke, NP 05/05/2023  Pt aware and understands NP's role.

## 2023-05-14 ENCOUNTER — Other Ambulatory Visit: Payer: Self-pay | Admitting: Cardiovascular Disease

## 2023-05-14 DIAGNOSIS — I150 Renovascular hypertension: Secondary | ICD-10-CM

## 2023-05-14 DIAGNOSIS — Z79899 Other long term (current) drug therapy: Secondary | ICD-10-CM

## 2023-05-14 DIAGNOSIS — I10 Essential (primary) hypertension: Secondary | ICD-10-CM

## 2023-06-11 ENCOUNTER — Other Ambulatory Visit: Payer: Self-pay | Admitting: Cardiovascular Disease

## 2023-06-11 ENCOUNTER — Telehealth: Payer: Self-pay

## 2023-06-11 ENCOUNTER — Other Ambulatory Visit: Payer: Self-pay | Admitting: Hematology and Oncology

## 2023-06-11 NOTE — Telephone Encounter (Signed)
 Received refill request for pt's AI therapy. Pt has not had MD f/u since 2023 and will need to come in to see MD before he will authorize a refill. Attempted to call pt and LVM for call back.

## 2023-07-18 ENCOUNTER — Other Ambulatory Visit: Payer: Self-pay | Admitting: Cardiovascular Disease

## 2023-07-21 ENCOUNTER — Other Ambulatory Visit: Payer: Self-pay | Admitting: Cardiovascular Disease

## 2023-12-01 ENCOUNTER — Ambulatory Visit: Admitting: Student in an Organized Health Care Education/Training Program

## 2023-12-03 NOTE — Progress Notes (Signed)
 Cardiology Office Note:   Date:  12/03/2023  ID:  Kimberly, Bates 01/15/1971, MRN [id number] PCP: [name]  [facility] Providers Cardiologist:  Kimberly Sor, MD (Inactive) { Chief Complaint: No chief complaint on file.     History of Present Illness:   [name] is a 53 y.o. 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 [name] is a 53 y.o. female   [id number] LOCATION:  FACILITY: [facility] PHYSICIAN: [name], M.D. [birth year]   DATE OF PROCEDURE:  04/15/2019  DATE OF DISCHARGE:     CARDIAC CATHETERIZATION    History obtained from chart review.53 y.o. female with a hx of renovascular HTN s/p L RA stent, NSTEMI 2017 w/ 65% LAD rx medically, HTN, breast CA, lymphoma of neck, GERD, Bell's palsy, COPD/ILD, who is being seen today for the evaluation of new CM w/ EF 25% and  elevated troponin at the request of Kimberly [name].Prior exposure to cardiotoxic chemotherapy during Lymphoma and Breast cancer therapy.  Previous nuclear study demonstrated apical infarction 2017. She was admitted with heart failure. Her enzymes were low and flat. 2D echo showed a decline in EF from the 40 to 45% range to the 25 to 30% range. Because of this she was referred for right left heart cath to define her anatomy and physiology.  Impression Ms. Chain has essentially normal coronary arteries suggesting that she has a nonischemic cardiomyopathy. Her LVEDP is low suggesting that she has been adequately diuresed. Selective angiography of her left renal artery stent shows this to be widely patent. She will need more aggressive pharmacologic therapy of her hypertension as well as her left ventricular dysfunction with guideline directed optimal medical therapy including but not limited to Entresto , carvedilol (or other beta-blocker), spironolactone . If her EF does not improve in 3 months she may require consideration for ICD implantation for primary prevention. MYNX closure devices were used to obtain hemostasis in both the artery and vein. The patient left lab in stable condition. These results have been communicated to the attending cardiologist, Kimberly. Claudene.  [name]. MD, [facility] 04/15/2019 8:49 AM  Findings Coronary Findings Diagnostic  Dominance: Right  No diagnostic findings have been documented. Intervention  No interventions have been documented.   CARDIAC CATHETERIZATION  CARDIAC CATHETERIZATION 09/18/2015  Conclusion Images from the original result were not included. 1. Mid LAD to Dist LAD lesion, 65% stenosed. This is in a tortuous segment with several hinge points. Focal areas may be more significant, however the remainder the area looks relatively okay. 2. Mid LAD lesion, 45% stenosed. 3. Ost 1st Diag to 1st Diag lesion, 50% stenosed. 4. Known mild to moderately reduced EF by  Echo today.  The patient had mild troponin elevation in the setting of hypertensive emergency/urgency and chest pain. Echo shows possible EF of roughly 45%. Final result pending.  I reviewed the images with Kimberly. Burnard, patient does have disease in the LAD, however this is a tortuous segment and there appears to be some spiraling of the vessel at this point. This would probably require relatively significant stent placement in the mid LAD. We discussed potential options, and Kimberly. Burnard felt it best to try to opt for medical management first.  Plan:  Return to nursing unit for continued medical management.  I will restart heparin  8 hours with sheath removal based on non-STEMI.  Continue to treat blood pressure and titrate antianginals.  Cardiovascular risk factor modification with statin and glycemic control.  If she fails medical management, would consider PCI of the distal targeting the LAD.    [name], M.D., M.S. Interventional Cardiologist  Pager # [phone] Phone # [phone] [address] [place], [place] [address]  Findings Coronary Findings Diagnostic  Dominance: Right  Left Main . Vessel is large.  Left Anterior Descending Discrete tubular. Diffuse.  Has a normal spiraling appearance due to tortuosity of the vessel. Some images appear to have a focal area of maybe 70%, however others looked less significant.  First Diagonal Branch The vessel is moderate in size and is angiographically normal. Besides ostial lesion Discrete located at the major branch.  First Septal Branch The vessel is moderate in size. Probably difficult PCI target and would require extensive stent placement.  Second Diagonal Branch The vessel is small in size.  Second Septal Branch The vessel is small in size.  Third Septal Branch The vessel is small in size.  Ramus Intermedius . Vessel is moderate in size.  Lateral Ramus Intermedius The vessel is small in  size.  Left Circumflex . Vessel is moderate in size.  First Obtuse Marginal Branch The vessel is large in size and is angiographically normal. The vessel is tortuous.  Lateral First Obtuse Marginal Branch The vessel is small in size.  Second Obtuse Marginal Branch The vessel is small in size.  Right Coronary Artery  Right Posterior Descending Artery The vessel is moderate in size.  Inferior Septal The vessel is small in size.  Right Posterior Atrioventricular Artery The vessel is moderate in size.  Intervention  No interventions have been documented.   STRESS TESTS  MYOCARDIAL PERFUSION IMAGING 12/30/2016  Interpretation Summary  Nuclear stress EF: 43%.  There was no ST segment deviation noted during stress.  Defect 1: There is a medium defect of severe severity present in the apical anterior, apical inferior and apex location.  This is an intermediate risk study.  The left ventricular ejection fraction is moderately decreased (30-44%).  Intermediate risk stress nuclear study with prior apical infarct and very mild peri-infarct ischemia; EF 43 with apical akinesis; study intermediate risk due to reduced LV function; note increased uptake right breast concerning for breast mass.   ECHOCARDIOGRAM  ECHOCARDIOGRAM COMPLETE 10/28/2022  Narrative ECHOCARDIOGRAM REPORT    Patient Name:   [facility] DELOIS  Seitzinger Date of Exam: 10/28/2022 Medical Rec #:  [id number]           Height:       63.0 in Accession #:    [id number]          Weight:       179.4 lb Date of Birth:  December 23, 1970           BSA:          1.846 m Patient Age:    51 years            BP:           133/93 mmHg Patient Gender: F                   HR:           79 bpm. Exam Location:  [address]  Procedure: 2D Echo, 3D Echo, Cardiac Doppler, Color Doppler and Strain Analysis  Indications:    I42.8 Nonischemic cardiomyopathy  History:        Patient has prior history of Echocardiogram examinations,  most recent 01/03/2021. Nonischemic cardiomyopathy, CAD and Previous Myocardial Infarction, Breast cancer, Status post right mastectomy with reconstruction. status post renal stent, Signs/Symptoms:1/6 systolic murmur; Risk Factors:Current Smoker. Status post revealed LVEF 55%, 3D EF 48% and PAP 33.9 mmHg.  Sonographer:    Nolon Berg BA, RDCS Referring Phys: [phone] THOMAS A KELLY  IMPRESSIONS   1. Left ventricular ejection fraction, by estimation, is 50 to 55%. Left ventricular ejection fraction by 3D volume is 53 %. The left ventricle has low normal function. The left ventricle has no regional wall motion abnormalities. Left ventricular diastolic parameters are consistent with Grade I diastolic dysfunction (impaired relaxation). The average left ventricular global longitudinal strain is -18.4 %. The global longitudinal strain is abnormal. 2. Right ventricular systolic function is low normal. The right ventricular size is normal. There is normal pulmonary artery systolic pressure. The estimated right ventricular systolic pressure is 24.5 mmHg. 3. The mitral valve is abnormal. Mild mitral valve regurgitation. 4. The aortic valve is tricuspid. Aortic valve regurgitation is not visualized. 5. The inferior vena cava is normal in size with greater than 50% respiratory variability, suggesting right atrial pressure of 3 mmHg.  Comparison(s): Changes from prior study are noted. 01/03/2021: LVEF 45-50%.  FINDINGS Left Ventricle: Left ventricular ejection fraction, by estimation, is 50 to 55%. Left ventricular ejection fraction by 3D volume is 53 %. The left ventricle has low normal function. The left ventricle has no regional wall motion abnormalities. The average left ventricular global longitudinal strain is -18.4 %. The global longitudinal strain is abnormal. The left ventricular internal cavity size was normal in size. There is no left ventricular hypertrophy. Left ventricular diastolic  parameters are consistent with Grade I diastolic dysfunction (impaired relaxation). Indeterminate filling pressures.  Right Ventricle: The right ventricular size is normal. No increase in right ventricular wall thickness. Right ventricular systolic function is low normal. There is normal pulmonary artery systolic pressure. The tricuspid regurgitant velocity is 2.32 m/s, and with an assumed right atrial pressure of 3 mmHg, the estimated right ventricular systolic pressure is 24.5 mmHg.  Left Atrium: Left atrial size was normal in size.  Right Atrium: Right atrial size was normal in size.  Pericardium: There is no evidence of pericardial effusion.  Mitral Valve: The mitral valve is abnormal. There is mild calcification of the anterior and posterior mitral valve leaflet(s). Mild mitral valve regurgitation, with centrally-directed jet.  Tricuspid Valve: The tricuspid valve is grossly normal. Tricuspid valve regurgitation is trivial.  Aortic Valve: The aortic valve is tricuspid. Aortic valve regurgitation is not visualized.  Pulmonic Valve: The pulmonic valve was normal in structure. Pulmonic valve regurgitation is not visualized.  Aorta: The aortic root and ascending aorta are structurally normal, with no evidence of dilitation.  Venous: The inferior vena cava is normal in size with greater than 50% respiratory variability, suggesting right atrial pressure of 3 mmHg.  IAS/Shunts: No atrial level shunt detected by color flow Doppler.   LEFT VENTRICLE PLAX 2D LVIDd:         4.60 cm         Diastology LVIDs:         2.80 cm         LV e' medial:    5.65 cm/s LV PW:         0.90 cm         LV E/e' medial:  12.9 LV IVS:        0.90 cm         LV e' lateral:   8.05 cm/s LVOT diam:     2.00 cm         LV E/e' lateral: 9.0 LV SV:         54 LV SV Index:   29              2D LVOT Area:     3.14 cm        Longitudinal Strain 2D Strain GLS  -17.3 % (A2C): 2D Strain GLS  -19.7 % (A3C): 2D  Strain GLS  -18.1 % (A4C): 2D Strain GLS  -18.4 % Avg:  3D Volume EF LV 3D EF:    Left ventricul ar ejection fraction by 3D volume is 53 %.  3D Volume EF: 3D EF:        53 % LV EDV:       113 ml LV ESV:       54 ml LV SV:        60 ml  RIGHT VENTRICLE             IVC RV Basal diam:  2.90 cm     IVC diam: 1.20 cm RV Mid diam:    2.30 cm RV S prime:     10.10 cm/s TAPSE (M-mode): 1.9 cm RVSP:           24.5 mmHg  LEFT ATRIUM             Index        RIGHT ATRIUM           Index LA diam:        3.90 cm 2.11 cm/m   RA Pressure: 3.00 mmHg LA Vol (A2C):   30.0 ml 16.25 ml/m  RA Area:     12.90 cm LA Vol (A4C):   27.1 ml 14.68 ml/m  RA Volume:   31.20 ml  16.90 ml/m LA Biplane Vol: 28.7 ml 15.54 ml/m AORTIC VALVE LVOT Vmax:   101.00 cm/s LVOT Vmean:  63.400 cm/s LVOT VTI:    0.172 m  AORTA Ao Root diam: 2.80 cm Ao Asc diam:  2.90 cm  MITRAL VALVE               TRICUSPID VALVE MV Area (PHT): 3.23 cm    TR Peak grad:   21.5 mmHg MV Decel Time: 235 msec    TR  Vmax:        232.00 cm/s MV E velocity: 72.80 cm/s  Estimated RAP:  3.00 mmHg MV A velocity: 96.90 cm/s  RVSP:           24.5 mmHg MV E/A ratio:  0.75 SHUNTS Systemic VTI:  0.17 m Systemic Diam: 2.00 cm  Vinie Maxcy MD Electronically signed by Vinie Maxcy MD Signature Date/Time: 10/28/2022/12:10:46 PM    Final          ______________________________________________________________________________________________      Risk Assessment/Calculations:   {Does this patient have ATRIAL FIBRILLATION?[phone]} No BP recorded.  {Refresh Note OR Click here to enter BP  :1}***        Physical Exam:     VS:  There were no vitals taken for this visit. ***    Wt Readings from Last 3 Encounters:  05/05/23 166 lb (75.3 kg)  11/15/22 173 lb 9.6 oz (78.7 kg)  04/30/22 179 lb 6.4 oz (81.4 kg)     GEN: Well nourished, well developed, in no acute distress NECK: No JVD; No carotid bruits CARDIAC:  ***RRR, no murmurs, rubs, gallops RESPIRATORY:  Clear to auscultation without rales, wheezing or rhonchi  ABDOMEN: Soft, non-tender, non-distended, normal bowel sounds EXTREMITIES:  Warm and well perfused, no edema; No deformity, 2+ radial pulses PSYCH: Normal mood and affect   Assessment & Plan Nonischemic cardiomyopathy (HCC)  Hyperlipidemia LDL goal <70  Renovascular hypertension: s/p left renal artery stenting       {Are you ordering a CV Procedure (e.g. stress test, cath, DCCV, TEE, etc)?   Press F2        :[id number]}   This note was written with the assistance of a dictation microphone or AI dictation software. Please excuse any typos or grammatical errors.   Signed, Georganna Archer, MD 12/03/2023 1:11 PM    [facility]

## 2023-12-04 ENCOUNTER — Encounter: Payer: Self-pay | Admitting: Student in an Organized Health Care Education/Training Program

## 2023-12-04 ENCOUNTER — Ambulatory Visit
Attending: Student in an Organized Health Care Education/Training Program | Admitting: Student in an Organized Health Care Education/Training Program

## 2023-12-04 ENCOUNTER — Other Ambulatory Visit (HOSPITAL_COMMUNITY): Payer: Self-pay

## 2023-12-04 VITALS — BP 134/80 | HR 70 | Ht 63.0 in | Wt 163.0 lb

## 2023-12-04 DIAGNOSIS — E785 Hyperlipidemia, unspecified: Secondary | ICD-10-CM | POA: Diagnosis present

## 2023-12-04 DIAGNOSIS — Z131 Encounter for screening for diabetes mellitus: Secondary | ICD-10-CM | POA: Diagnosis present

## 2023-12-04 DIAGNOSIS — I2089 Other forms of angina pectoris: Secondary | ICD-10-CM | POA: Diagnosis present

## 2023-12-04 DIAGNOSIS — I150 Renovascular hypertension: Secondary | ICD-10-CM | POA: Diagnosis present

## 2023-12-04 DIAGNOSIS — I428 Other cardiomyopathies: Secondary | ICD-10-CM | POA: Insufficient documentation

## 2023-12-04 DIAGNOSIS — Z720 Tobacco use: Secondary | ICD-10-CM | POA: Insufficient documentation

## 2023-12-04 MED ORDER — SPIRONOLACTONE 25 MG PO TABS
25.0000 mg | ORAL_TABLET | Freq: Every day | ORAL | 3 refills | Status: DC
  Filled 2023-12-04: qty 90, 90d supply, fill #0

## 2023-12-04 MED ORDER — BUPROPION HCL ER (XL) 150 MG PO TB24
150.0000 mg | ORAL_TABLET | Freq: Every day | ORAL | 3 refills | Status: AC
  Filled 2023-12-04: qty 90, 90d supply, fill #0

## 2023-12-04 MED ORDER — METOPROLOL TARTRATE 100 MG PO TABS
100.0000 mg | ORAL_TABLET | Freq: Once | ORAL | 0 refills | Status: DC
  Filled 2023-12-04: qty 1, 1d supply, fill #0

## 2023-12-04 NOTE — Assessment & Plan Note (Signed)
 Her blood pressure was a tad bit elevated today but historically has been well-controlled.  I encouraged her to check her blood pressures at home and to let me know the results.  No changes at this time. -Continue current antihypertensive regimen

## 2023-12-04 NOTE — Patient Instructions (Signed)
 Medication Instructions:  START Bupropion  XL 150 mg daily  START Spironolactone  25 mg   DAY OF CT:   metoprolol  tartrate (LOPRESSOR ) 100 MG tablet         Take 1 tablet (100 mg total) by mouth once for 1 dose. Take 90-120 minutes prior to scan. Hold for SBP less than 110.    *If you need a refill on your cardiac medications before your next appointment, please call your pharmacy*  Lab Work: LIPID PANEL  CMP HGB A1C   If you have labs (blood work) drawn today and your tests are completely normal, you will receive your results only by: MyChart Message (if you have MyChart) OR A paper copy in the mail If you have any lab test that is abnormal or we need to change your treatment, we will call you to review the results.  Testing/Procedures: CORONARY CTA    Your cardiac CT will be scheduled at one of the below locations:   [facility] [address]  [place], [place] [address] [phone]  If scheduled at the Heart and Vascular Tower at [facility] street, please enter the parking lot using the [address] entrance and use the FREE valet service at the patient drop-off area. Enter the building and check-in with registration on the main floor.   There is spacious parking and easy access to the radiology department from the [facility] entrance. Please enter here and check-in with the desk attendant.   If scheduled at [facility], please arrive 30 minutes early for check-in and test prep.  Please follow these instructions carefully (unless otherwise directed):  An IV will be required for this test and Nitroglycerin  will be given.  Hold all erectile dysfunction medications at least 3 days (72 hrs) prior to test. (Ie viagra, cialis, sildenafil, tadalafil, etc)   On the Night Before the Test: Be sure to Drink plenty of water. Do not consume any caffeinated/decaffeinated beverages or chocolate 12 hours prior to your test. Do  not take any antihistamines 12 hours prior to your test.   On the Day of the Test: Drink plenty of water until 1 hour prior to the test. Do not eat any food 1 hour prior to test. You may take your regular medications prior to the test.  Take metoprolol  (Lopressor ) two hours prior to test. If you take Furosemide /Hydrochlorothiazide/Spironolactone /Chlorthalidone, please HOLD on the morning of the test. Patients who wear a continuous glucose monitor MUST remove the device prior to scanning. FEMALES- please wear underwire-free bra if available, avoid dresses & tight clothing       After the Test: Drink plenty of water. After receiving IV contrast, you may experience a mild flushed feeling. This is normal. On occasion, you may experience a mild rash up to 24 hours after the test. This is not dangerous. If this occurs, you can take Benadryl  25 mg, Zyrtec, Claritin , or Allegra and increase your fluid intake. (Patients taking Tikosyn should avoid Benadryl , and may take Zyrtec, Claritin , or Allegra) If you experience trouble breathing, this can be serious. If it is severe call 911 IMMEDIATELY. If it is mild, please call our office.  We will call to schedule your test 2-4 weeks out understanding that some insurance companies will need an authorization prior to the service being performed.   For more information and frequently asked questions, please visit our website : [email or web address]  For non-scheduling related questions, please contact the cardiac imaging nurse  navigator should you have any questions/concerns: Cardiac Imaging Nurse Navigators Direct Office Dial: [phone]   For scheduling needs, including cancellations and rescheduling, please call [place], [phone].   Follow-Up: At [facility], you and your health needs are our priority.  As part of our continuing mission to provide you with exceptional heart care, our providers are all part of one team.   This team includes your primary Cardiologist (physician) and Advanced Practice Providers or APPs (Physician Assistants and Nurse Practitioners) who all work together to provide you with the care you need, when you need it.  Your next appointment:   3 month(s)  Provider:   Georganna Archer, MD    We recommend signing up for the patient portal called MyChart.  Sign up information is provided on this After Visit Summary.  MyChart is used to connect with patients for Virtual Visits (Telemedicine).  Patients are able to view lab/test results, encounter notes, upcoming appointments, etc.  Non-urgent messages can be sent to your provider as well.   To learn more about what you can do with MyChart, go to [email or web address].

## 2023-12-05 ENCOUNTER — Ambulatory Visit: Payer: Self-pay | Admitting: Student in an Organized Health Care Education/Training Program

## 2023-12-05 DIAGNOSIS — I2510 Atherosclerotic heart disease of native coronary artery without angina pectoris: Secondary | ICD-10-CM

## 2023-12-05 DIAGNOSIS — E785 Hyperlipidemia, unspecified: Secondary | ICD-10-CM

## 2023-12-05 LAB — COMPREHENSIVE METABOLIC PANEL WITH GFR
ALT: 18 IU/L (ref 0–32)
AST: 18 IU/L (ref 0–40)
Albumin: 4.7 g/dL (ref 3.8–4.9)
Alkaline Phosphatase: 248 IU/L — ABNORMAL HIGH (ref 44–121)
BUN/Creatinine Ratio: 11 (ref 9–23)
BUN: 9 mg/dL (ref 6–24)
Bilirubin Total: 0.4 mg/dL (ref 0.0–1.2)
CO2: 22 mmol/L (ref 20–29)
Calcium: 11 mg/dL — ABNORMAL HIGH (ref 8.7–10.2)
Chloride: 104 mmol/L (ref 96–106)
Creatinine, Ser: 0.82 mg/dL (ref 0.57–1.00)
Globulin, Total: 2.2 g/dL (ref 1.5–4.5)
Glucose: 76 mg/dL (ref 70–99)
Potassium: 4.6 mmol/L (ref 3.5–5.2)
Sodium: 140 mmol/L (ref 134–144)
Total Protein: 6.9 g/dL (ref 6.0–8.5)
eGFR: 87 mL/min/1.73 (ref >59)

## 2023-12-05 LAB — LIPID PANEL
Chol/HDL Ratio: 4.6 ratio — ABNORMAL HIGH (ref 0.0–4.4)
Cholesterol, Total: 194 mg/dL (ref 100–199)
HDL: 42 mg/dL (ref >39)
LDL Chol Calc (NIH): 129 mg/dL — ABNORMAL HIGH (ref 0–99)
Triglycerides: 125 mg/dL (ref 0–149)
VLDL Cholesterol Cal: 23 mg/dL (ref 5–40)

## 2023-12-05 LAB — HEMOGLOBIN A1C
Est. average glucose Bld gHb Est-mCnc: 105 mg/dL
Hgb A1c MFr Bld: 5.3 % (ref 4.8–5.6)

## 2023-12-05 MED ORDER — EZETIMIBE 10 MG PO TABS: 10.0000 mg | ORAL_TABLET | Freq: Every day | ORAL | 3 refills | Status: AC

## 2023-12-09 ENCOUNTER — Encounter (HOSPITAL_COMMUNITY): Payer: Self-pay

## 2023-12-11 ENCOUNTER — Ambulatory Visit (HOSPITAL_COMMUNITY)
Admission: RE | Admit: 2023-12-11 | Discharge: 2023-12-11 | Disposition: A | Discharge status: Home / Self Care | Source: Ambulatory Visit | Attending: Student in an Organized Health Care Education/Training Program | Admitting: Student in an Organized Health Care Education/Training Program

## 2023-12-11 ENCOUNTER — Ambulatory Visit (HOSPITAL_BASED_OUTPATIENT_CLINIC_OR_DEPARTMENT_OTHER)
Admission: RE | Admit: 2023-12-11 | Discharge: 2023-12-11 | Disposition: A | Discharge status: Home / Self Care | Source: Ambulatory Visit | Attending: Cardiovascular Disease | Admitting: Cardiovascular Disease

## 2023-12-11 ENCOUNTER — Other Ambulatory Visit: Payer: Self-pay | Admitting: Cardiovascular Disease

## 2023-12-11 DIAGNOSIS — R931 Abnormal findings on diagnostic imaging of heart and coronary circulation: Secondary | ICD-10-CM

## 2023-12-11 DIAGNOSIS — I25118 Atherosclerotic heart disease of native coronary artery with other forms of angina pectoris: Secondary | ICD-10-CM

## 2023-12-11 DIAGNOSIS — I2510 Atherosclerotic heart disease of native coronary artery without angina pectoris: Secondary | ICD-10-CM | POA: Diagnosis not present

## 2023-12-11 DIAGNOSIS — I2089 Other forms of angina pectoris: Secondary | ICD-10-CM

## 2023-12-11 MED ORDER — IOHEXOL 350 MG/ML SOLN
100.0000 mL | Freq: Once | INTRAVENOUS | Status: AC | PRN
Start: 2023-12-11 — End: 2023-12-11
  Administered 2023-12-11: 100 mL via INTRAVENOUS

## 2023-12-11 MED ORDER — NITROGLYCERIN 0.4 MG SL SUBL
0.8000 mg | SUBLINGUAL_TABLET | Freq: Once | SUBLINGUAL | Status: AC
  Administered 2023-12-11: 0.8 mg via SUBLINGUAL

## 2023-12-24 ENCOUNTER — Ambulatory Visit (INDEPENDENT_AMBULATORY_CARE_PROVIDER_SITE_OTHER): Admitting: Nurse Practitioner

## 2023-12-24 ENCOUNTER — Encounter: Payer: Self-pay | Admitting: Nurse Practitioner

## 2023-12-24 VITALS — BP 119/79 | HR 64 | Temp 98.1°F | Ht 63.0 in | Wt 165.2 lb

## 2023-12-24 DIAGNOSIS — J449 Chronic obstructive pulmonary disease, unspecified: Secondary | ICD-10-CM

## 2023-12-24 DIAGNOSIS — J4489 Other specified chronic obstructive pulmonary disease: Secondary | ICD-10-CM

## 2023-12-24 DIAGNOSIS — F17210 Nicotine dependence, cigarettes, uncomplicated: Secondary | ICD-10-CM | POA: Diagnosis not present

## 2023-12-24 MED ORDER — ALBUTEROL SULFATE HFA 108 (90 BASE) MCG/ACT IN AERS
2.0000 | INHALATION_SPRAY | Freq: Four times a day (QID) | RESPIRATORY_TRACT | 2 refills | Status: AC | PRN
Start: 2023-12-24 — End: ?

## 2023-12-24 MED ORDER — ALBUTEROL SULFATE (2.5 MG/3ML) 0.083% IN NEBU: 2.5000 mg | INHALATION_SOLUTION | Freq: Four times a day (QID) | RESPIRATORY_TRACT | 12 refills | Status: AC | PRN

## 2023-12-24 MED ORDER — TRELEGY ELLIPTA 100-62.5-25 MCG/ACT IN AEPB: 1.0000 | INHALATION_SPRAY | Freq: Every day | RESPIRATORY_TRACT | 5 refills | Status: AC

## 2023-12-24 NOTE — Progress Notes (Signed)
 @Patient  ID: [name], female    DOB: December 24, 1970, 53 y.o.   MRN: [id number]  Chief Complaint  Patient presents with   COPD    4 month follow up    Referring provider: Emilio Joesph VEAR DEVONNA  HPI: 52 year old female, active smoker followed for COPD/asthma.  She is a patient Dr. Chari and last seen in office 04/2023 by [facility] NP.  Past medical history significant for hypertension, left renal artery stenosis, history of NSTEMI, CAD, CHF, GERD, Bell's palsy, history of right breast cancer stage IIb.   TEST/EVENTS:  12/25/2020 PFT: FVC 75, FEV1 58, ratio 55, TLC 101, DLCOcor 59 01/03/2021 echocardiogram: EF 45 to 50% with hypokinesis.  Left atrial dilatation.  Mild MR.  RV size and function normal. 02/11/2022 CXR: mild diffuse b/l interstitial pulmonary opacity, may reflect atypical/viral infection or edema  02/05/2021: OV with Dr. Darlean.  Doing well on Trelegy.  Able to do treadmill up to 35 minutes x 3 days a week.  No orthopnea.  Still smoking.  Follow-up 12 months  02/11/2022: OV with Damichael Hofman NP for yearly follow-up.  She ran out of Trelegy about a month ago.  Unfortunately, she has had some worsening respiratory symptoms since.  Feels like her breathing has been more short and she is having a frequent, productive cough with yellow sputum.  She also been wheezing more.  This has been ongoing for the past 3 weeks or so, since she came off the Trelegy.  She is also had some right-sided chest discomfort which occasionally radiates from her neck down.  Notices that the pain is worse when she lays flat.  No exertional trigger.  Does not seem to be worse after eating.  She has not tried any nitroglycerin  for this.  Denies any palpitations, dizziness, lightheadedness, PND, orthopnea, leg swelling.  She thinks that it is primarily related to her COPD/asthma.  Has not talked her cardiologist about this.  Currently using albuterol  few times a day.  Denies any fevers, chills, hemoptysis, calf pain.    05/05/2023: OV with Celica Kotowski NP Patient presents today for overdue follow up. She has been doing relatively well since her last visit. She hasn't had any flare ups requiring steroids or abx. She did develop a cold about a month ago and has had more of a dry cough since then. She does have some chest tightness and wheezing as well. No fevers, chills, hemoptysis, leg swelling. She's been having to use her albuterol  more - 2 x/day. She's still using her Trelegy daily. She is still smoking - about 5 cigarettes a day now.   12/24/2023: Today - follow up Discussed the use of AI scribe software for clinical note transcription with the patient, who gave verbal consent to proceed.  History of Present Illness [name] is a 53 year old female who presents for follow-up.  She has not experienced any recent hospitalizations or exacerbations, and has not required prednisone  since her last visit. She uses an albuterol  inhaler as needed, sometimes twice a day, particularly when feeling congested. Additionally, she uses her nebulizer more frequently due to the changing weather, approximately twice a month when symptoms worsen. Uses Trelegy daily. No wheezing, chest congestion, hemoptysis, weight loss, anorexia.   She experiences a chronic cough that produces thick white sputum, occasionally yellow.   She smokes approximately six cigarettes a day, having reduced her intake since the last visit. She smokes half a cigarette at a time and has recently started Wellbutrin  to  aid in smoking cessation.    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   GERD (gastroesophageal reflux disease)    Hypertension    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   Myocardial infarction (HCC) 2017   Renal artery stenosis    s/p stent placment to left    Respiratory failure (HCC) 03/2019    Tobacco History: Social History   Tobacco Use  Smoking Status Every Day   Current packs/day: 0.75   Average packs/day: 0.8 packs/day for 33.7 years (25.3 ttl pk-yrs)   Types: Cigarettes   Start date: 1992  Smokeless Tobacco Never  Tobacco Comments   6 cigarettes a day 12/24/2023 KRD   Ready to quit: Not Answered Counseling given: Not Answered Tobacco comments: 6 cigarettes a day 12/24/2023 KRD   Outpatient Medications Prior to Visit  Medication Sig Dispense Refill   acetaminophen  (TYLENOL ) 500 MG tablet Take 1,000 mg by mouth every 8 (eight) hours as needed for mild pain or headache.     aspirin  81 MG chewable tablet Chew 81 mg by mouth daily.     atorvastatin  (LIPITOR ) 80 MG tablet Take 1 tablet (80 mg total) by mouth daily. 90 tablet 3   buPROPion  (WELLBUTRIN  XL) 150 MG 24 hr tablet Take 1 tablet (150 mg total) by mouth daily. 90 tablet 3   cetirizine (ZYRTEC) 10 MG tablet Take 10 mg by mouth daily.     [employer] D3 5000 units capsule Take 5,000 Units by mouth daily.  11   empagliflozin  (JARDIANCE ) 10 MG TABS tablet Take 1 tablet (10 mg total) by mouth daily before breakfast. 30 tablet 3   ezetimibe  (ZETIA ) 10 MG tablet Take 1 tablet (10 mg total) by mouth daily. 90 tablet 3   furosemide  (LASIX ) 40 MG tablet Take 1 tablet (40 mg total) by mouth daily. 90 tablet 1   letrozole  (FEMARA ) 2.5 MG tablet TAKE 1 TABLET BY MOUTH EVERY DAY 90 tablet 3   metoprolol  tartrate (LOPRESSOR ) 100 MG tablet Take 1 tablet (100 mg total) by  mouth once for 1 dose. Take 90-120 minutes prior to scan. Hold for SBP less than 110. 1 tablet 0   nitroGLYCERIN  (NITROSTAT ) 0.4 MG SL tablet Place 1 tablet (0.4 mg total) under the tongue every 5 (five) minutes as needed for chest pain. 25 tablet 3   sacubitril -valsartan  (ENTRESTO ) 49-51 MG Take 1 tablet by mouth daily. 60 tablet 3   spironolactone  (ALDACTONE ) 25 MG tablet Take 1 tablet (25 mg total) by mouth daily. 90 tablet 3   venlafaxine  XR (EFFEXOR -XR) 37.5 MG 24 hr capsule Take 1 capsule (37.5 mg total) by mouth daily with breakfast. 90 capsule 3   albuterol  (PROVENTIL ) (2.5 MG/3ML) 0.083% nebulizer solution Take 3 mLs (2.5  mg total) by nebulization every 6 (six) hours as needed for wheezing or shortness of breath. 75 mL 12   albuterol  (VENTOLIN  HFA) 108 (90 Base) MCG/ACT inhaler Inhale 2 puffs into the lungs every 6 (six) hours as needed for wheezing or shortness of breath.     Fluticasone-Umeclidin-Vilant (TRELEGY ELLIPTA ) 100-62.5-25 MCG/ACT AEPB Inhale 1 puff into the lungs daily. 60 each 5   metoprolol  succinate (TOPROL -XL) 100 MG 24 hr tablet TAKE 1 TABLET (100 MG TOTAL) BY MOUTH DAILY. TAKE WITH OR IMMEDIATELY FOLLOWING A MEAL. 90 tablet 3   No facility-administered medications prior to visit.     Review of Systems: As above     Physical Exam:  BP 119/79   Pulse 64   Temp 98.1 F (36.7 C) (Oral)   Ht 5' 3 (1.6 m)   Wt 165 lb 3.2 oz (74.9 kg)   SpO2 94%   BMI 29.26 kg/m   GEN: Pleasant, interactive, well-appearing; obese; in no acute distress. HEENT:  Normocephalic and atraumatic. PERRLA. Sclera white. Nasal turbinates pink, moist and patent bilaterally. No rhinorrhea present. Oropharynx pink and moist, without exudate or edema. No lesions, ulcerations, or postnasal drip.  NECK:  Supple w/ fair ROM.  CV: RRR, no m/r/g, no peripheral edema. Pulses intact, +2 bilaterally. No cyanosis, pallor or clubbing. PULMONARY:  Unlabored, regular breathing. Diminished bibasilar  airflow w/o wheezes/rales/rhonchi. No accessory muscle use.  GI: BS present and normoactive. Soft, non-tender to palpation. MSK: No erythema, warmth or tenderness. Cap refil <2 sec all extrem.  Neuro: A/Ox3. No focal deficits noted.   Skin: Warm, no lesions or rashe Psych: Normal affect and behavior. Judgement and thought content appropriate.     Lab Results:  CBC    Component Value Date/Time   WBC 10.6 04/30/2022 1027   WBC 8.7 06/21/2021 1227   WBC 9.5 11/13/2020 1125   RBC 4.78 04/30/2022 1027   RBC 4.91 06/21/2021 1227   HGB 14.8 04/30/2022 1027   HGB 11.5 (L) 10/11/2016 0955   HCT 43.3 04/30/2022 1027   HCT 34.8 10/11/2016 0955   PLT 238 04/30/2022 1027   MCV 91 04/30/2022 1027   MCV 93.8 10/11/2016 0955   MCH 31.0 04/30/2022 1027   MCH 30.3 06/21/2021 1227   MCHC 34.2 04/30/2022 1027   MCHC 33.6 06/21/2021 1227   RDW 13.5 04/30/2022 1027   RDW 17.7 (H) 10/11/2016 0955   LYMPHSABS 2.9 06/21/2021 1227   LYMPHSABS 2.9 10/11/2016 0955   MONOABS 0.5 06/21/2021 1227   MONOABS 2.0 (H) 10/11/2016 0955   EOSABS 0.1 06/21/2021 1227   EOSABS 0.0 10/11/2016 0955   BASOSABS 0.1 06/21/2021 1227   BASOSABS 0.0 10/11/2016 0955    BMET    Component Value Date/Time   NA 140 12/04/2023 1320   NA 142 10/11/2016 0955   K 4.6 12/04/2023 1320   K 3.2 (L) 10/11/2016 0955   CL 104 12/04/2023 1320   CO2 22 12/04/2023 1320   CO2 23 10/11/2016 0955   GLUCOSE 76 12/04/2023 1320   GLUCOSE 92 06/21/2021 1227   GLUCOSE 84 10/11/2016 0955   BUN 9 12/04/2023 1320   BUN 6.8 (L) 10/11/2016 0955   CREATININE 0.82 12/04/2023 1320   CREATININE 0.86 06/21/2021 1227   CREATININE 0.7 10/11/2016 0955   CALCIUM  11.0 (H) 12/04/2023 1320   CALCIUM  9.5 10/11/2016 0955   GFRNONAA >60 06/21/2021 1227   GFRAA 103 07/02/2019 1215    BNP    Component Value Date/Time  BNP 28.8 08/25/2020 1124   BNP 348.6 (H) 04/11/2019 0520     Imaging:  CT CORONARY MORPH W/CTA COR W/SCORE W/CA W/CM  &/OR WO/CM Addendum Date: 12/21/2023 ADDENDUM REPORT: 12/21/2023 14:45 EXAM: OVER-READ INTERPRETATION  CT CHEST The following report is an over-read performed by radiologist Dr. Andrea Gasman of [facility] on 12/21/2023. This over-read does not include interpretation of cardiac or coronary anatomy or pathology. The coronary CTA interpretation by the cardiologist is attached. COMPARISON:  Chest CT 10/07/2017 FINDINGS: Vascular: No aortic atherosclerosis. The included aorta is normal in caliber. Catheter tip is in the SVC. Mediastinum/nodes: No adenopathy or mass.  Small hiatal hernia. Lungs: Emphysema with moderate bronchial thickening. Areas of mucoid impaction in the right greater than left lower lobes. Subpleural scarring in the right lower lobe. No focal airspace disease. No pulmonary nodule. No pleural fluid. Upper abdomen: No acute or unexpected findings. Musculoskeletal: There are no acute or suspicious osseous abnormalities. Right mastectomy. IMPRESSION: 1. Emphysema with moderate bronchial thickening. Areas of mucoid impaction in the right greater than left lower lobes. 2. Small hiatal hernia. Emphysema (ICD10-J43.9). Electronically Signed   By: Andrea Gasman M.D.   On: 12/21/2023 14:45   Result Date: 12/21/2023 CLINICAL DATA:  Chest pain EXAM: Cardiac CTA MEDICATIONS: Sub lingual nitro. 4mg  and lopressor  100mg  TECHNIQUE: A non-contrast, gated CT scan was obtained with axial slices of 2.5 mm through the heart for calcium  scoring. Calcium  scoring was performed using the Agatston method. A 120 kV prospective, gated, contrast cardiac CT scan was obtained. Gantry rotation speed was 230 msec and collimation was 0.63 mm. Two sublingual nitroglycerin  tablets (0.8 mg) were given. The 3D data set was reconstructed with motion correction for the best systolic or diastolic phase. Images were analyzed on a dedicated workstation using MPR, MIP, and VRT modes. The patient received 95 cc of contrast  FINDINGS: Non-cardiac: See separate report from [facility]. No significant findings on limited lung and soft tissue windows. Calcium  Score: LM and 3 vessel calcium  noted LM 140 LAD 117 LCX 34.1 RCA 7.63 Total 299 Coronary Arteries: Right dominant with no anomalies LM: 25-49% mixed plaque LAD: 1-24% calcified plaque ostium/proximal. 25-49% mixed plaque mid vessel . D1: 1-24% soft plaque in proximal and mid vessel D2: Small vessel normal Circumflex: Short segment with 2 OM branches 1-24% soft plaque OM1: 25-49% calcified plaque proximally OM2: 1-24% mixed plaque proximally RCA: 1-24% soft plaque proximally. PDA: Normal PLA: 1-24% calcified plaque IMPRESSION: 1. LM and 3 vessel calcium  noted with score 299, which is 99 th percentile for age/sex 2.  Normal ascending thoracic aorta 3.0 cm 3. CAD RADS 2 non obstructive CAD see description above. Sent for FFR CT given high calcium  score and soft plaque Maude Emmer Electronically Signed: By: Maude Emmer M.D. On: 12/11/2023 15:57   CT CORONARY FFR DATA PREP & FLUID ANALYSIS Result Date: 12/11/2023 CLINICAL DATA:  CAD EXAM: FFR CT TECHNIQUE: The best systolic and diastolic phases of the patients gated cardiac CTA sent to [facility] for hemodynamic analysis FINDINGS: RCA: Normal proximal 0.97, mid 0.95, distal 0.89 LAD: Normal proximal 0.96, mid 0.93, distal 0.89 LCX: Normal proximal 90, mid 0.89, distal 0.88 IMPRESSION: Normal FFR CT suggesting no obstructive CAD Maude Emmer Electronically Signed   By: Maude Emmer M.D.   On: 12/11/2023 16:00    Administration History     None          Latest Ref Rng & Units 12/25/2020   12:49 PM  PFT Results  FVC-Pre L 2.11   FVC-Predicted Pre % 75   FVC-Post L 2.35   FVC-Predicted Post % 83   Pre FEV1/FVC % % 63   Post FEV1/FCV % % 55   FEV1-Pre L 1.33   FEV1-Predicted Pre % 58   FEV1-Post L 1.30   DLCO uncorrected ml/min/mmHg 10.52   DLCO UNC% % 51   DLCO corrected ml/min/mmHg 10.52   DLCO COR  %Predicted % 51   DLVA Predicted % 59   TLC L 4.97   TLC % Predicted % 101   RV % Predicted % 138     No results found for: NITRICOXIDE      Assessment & Plan:   COPD  GOLD 2/ active smoker COPD with asthma. Compensated on current regimen. No recent exacerbations. Continue current maintenance regimen. Smoking cessation advised. Action plan in place.  Patient Instructions  Continue Albuterol  inhaler 2 puffs or 3 mL neb every 6 hours as needed for shortness of breath or wheezing. Notify if symptoms persist despite rescue inhaler/neb use. Continue Trelegy 1 puff daily. Brush tongue and rinse mouth afterwards   Referred to lung cancer screening program   Work on quitting smoking!   Follow up in 6 months with Dr. Darlean or Izetta Malachy PIETY. If symptoms do not improve or worsen, please contact office for sooner follow up or seek emergency care.    Cigarette smoker Approx 24 pack year hx. Refer to lung cancer screening program. Smoking cessation advised.      I spent 25 minutes of dedicated to the care of this patient on the date of this encounter to include pre-visit review of records, face-to-face time with the patient discussing conditions above, post visit ordering of testing, clinical documentation with the electronic health record, making appropriate referrals as documented, and communicating necessary findings to members of the patients care team.  Comer LULLA Malachy, NP 12/24/2023  Pt aware and understands NP's role.

## 2023-12-24 NOTE — Assessment & Plan Note (Signed)
 Approx 24 pack year hx. Refer to lung cancer screening program. Smoking cessation advised.

## 2023-12-24 NOTE — Assessment & Plan Note (Signed)
 COPD with asthma. Compensated on current regimen. No recent exacerbations. Continue current maintenance regimen. Smoking cessation advised. Action plan in place.  Patient Instructions  Continue Albuterol  inhaler 2 puffs or 3 mL neb every 6 hours as needed for shortness of breath or wheezing. Notify if symptoms persist despite rescue inhaler/neb use. Continue Trelegy 1 puff daily. Brush tongue and rinse mouth afterwards   Referred to lung cancer screening program   Work on quitting smoking!   Follow up in 6 months with Dr. Darlean or Izetta Malachy PIETY. If symptoms do not improve or worsen, please contact office for sooner follow up or seek emergency care.

## 2023-12-24 NOTE — Patient Instructions (Addendum)
 Continue Albuterol  inhaler 2 puffs or 3 mL neb every 6 hours as needed for shortness of breath or wheezing. Notify if symptoms persist despite rescue inhaler/neb use. Continue Trelegy 1 puff daily. Brush tongue and rinse mouth afterwards   Referred to lung cancer screening program   Work on quitting smoking!   Follow up in 6 months with Dr. Darlean or [name]. If symptoms do not improve or worsen, please contact office for sooner follow up or seek emergency care.

## 2024-01-12 ENCOUNTER — Other Ambulatory Visit: Payer: Self-pay | Admitting: Student in an Organized Health Care Education/Training Program

## 2024-01-13 ENCOUNTER — Other Ambulatory Visit (HOSPITAL_COMMUNITY): Payer: Self-pay | Admitting: Surgery

## 2024-01-13 ENCOUNTER — Other Ambulatory Visit: Payer: Self-pay | Admitting: Student in an Organized Health Care Education/Training Program

## 2024-01-13 DIAGNOSIS — E213 Hyperparathyroidism, unspecified: Secondary | ICD-10-CM

## 2024-01-15 MED ORDER — EMPAGLIFLOZIN 10 MG PO TABS: 10.0000 mg | ORAL_TABLET | Freq: Every day | ORAL | 3 refills | Status: AC

## 2024-01-16 ENCOUNTER — Telehealth: Payer: Self-pay | Admitting: Pharmacy Technician

## 2024-01-16 MED ORDER — SACUBITRIL-VALSARTAN 49-51 MG PO TABS: 1.0000 | ORAL_TABLET | Freq: Every day | ORAL | 3 refills | Status: DC

## 2024-01-16 NOTE — Telephone Encounter (Signed)
   Pharmacy Patient Advocate Encounter   Received notification from CoverMyMeds that prior authorization for jardiance  is required/requested.   Insurance verification completed.   The patient is insured through [facility] MEDICAID.   Per test claim: PA required; PA submitted to above mentioned insurance via Latent Key/confirmation #/EOC [id number] Status is pending

## 2024-01-16 NOTE — Telephone Encounter (Signed)
 Pharmacy Patient Advocate Encounter  Received notification from [facility] MEDICAID that Prior Authorization for jardiance  has been APPROVED from 01/16/24 to 01/15/25   PA #/Case ID/Reference #: [id number]

## 2024-01-16 NOTE — Telephone Encounter (Signed)
    Pharmacy Patient Advocate Encounter   Received notification from Onbase that prior authorization for sacubitril /valsartan  is required/requested.   Insurance verification completed.   The patient is insured through [facility] MEDICAID.   Per test claim: PA required; PA submitted to above mentioned insurance via Latent Key/confirmation #/EOC [id number] Status is pending

## 2024-01-19 ENCOUNTER — Other Ambulatory Visit (HOSPITAL_COMMUNITY): Payer: Self-pay

## 2024-01-19 NOTE — Telephone Encounter (Signed)
 Pharmacy Patient Advocate Encounter  Received notification from [facility] MEDICAID that Prior Authorization for sacubitril /valsartan  has been DENIED.  Full denial letter will be uploaded to the media tab. See denial reason below.   Insurance wants brand   Insurance will pay for brand and copay 4.00 per test claim  I called [facility] and asked them to run for brand and now it is being filled

## 2024-01-22 ENCOUNTER — Other Ambulatory Visit: Payer: Self-pay | Admitting: General Practice

## 2024-01-22 DIAGNOSIS — I150 Renovascular hypertension: Secondary | ICD-10-CM

## 2024-01-22 DIAGNOSIS — Z79899 Other long term (current) drug therapy: Secondary | ICD-10-CM

## 2024-01-22 DIAGNOSIS — I10 Essential (primary) hypertension: Secondary | ICD-10-CM

## 2024-01-22 MED ORDER — FUROSEMIDE 40 MG PO TABS: 40.0000 mg | ORAL_TABLET | Freq: Every day | ORAL | 3 refills | Status: AC

## 2024-02-02 ENCOUNTER — Encounter (HOSPITAL_COMMUNITY)
Admission: RE | Admit: 2024-02-02 | Discharge: 2024-02-02 | Disposition: A | Discharge status: Home / Self Care | Source: Ambulatory Visit | Attending: Surgery | Admitting: Surgery

## 2024-02-02 DIAGNOSIS — E213 Hyperparathyroidism, unspecified: Secondary | ICD-10-CM | POA: Diagnosis present

## 2024-02-02 MED ORDER — TECHNETIUM TC 99M SESTAMIBI - CARDIOLITE
26.0000 | Freq: Once | INTRAVENOUS | Status: AC
  Administered 2024-02-02: 26 via INTRAVENOUS

## 2024-02-03 NOTE — Result Encounter Note (Signed)
 24 hour urine for calcium  is as expected.  Await reading on sestamibi scan performed yesterday.  Krystal Spinner, MD [facility] Office: [phone]

## 2024-02-06 ENCOUNTER — Ambulatory Visit: Payer: Self-pay | Admitting: Surgery

## 2024-02-06 NOTE — Progress Notes (Signed)
 Sestamibi scan is negative for parathyroid adenoma.  Will proceed with 4D-CT scan as we discussed in the office.  Burnard - please arrange for 4D-CT scan with parathyroid protocol for this patient.  I will contact the patient when the results are available.  Krystal Spinner, MD [facility] practice Office: [phone]

## 2024-03-04 ENCOUNTER — Ambulatory Visit: Admitting: Student in an Organized Health Care Education/Training Program

## 2024-03-23 ENCOUNTER — Telehealth: Payer: Self-pay

## 2024-03-23 ENCOUNTER — Other Ambulatory Visit (HOSPITAL_COMMUNITY): Payer: Self-pay

## 2024-03-23 NOTE — Telephone Encounter (Signed)
 Pharmacy Patient Advocate Encounter   Received notification from CoverMyMeds that prior authorization for ENTRESTO  is required/requested.   Insurance verification completed.   The patient is insured through [employer].   Per test claim: The current 90 day co-pay is, $4.  No PA needed at this time. This test claim was processed through [facility]- copay amounts may vary at other pharmacies due to pharmacy/plan contracts, or as the patient moves through the different stages of their insurance plan.

## 2024-03-31 NOTE — Progress Notes (Signed)
 " Cardiology Office Note:   Date:  03/31/2024  ID:  [name], DOB 10-12-1970, MRN [id number] PCP: Emilio Joesph VEAR DEVONNA  [facility] Providers Cardiologist:  Georganna Archer, MD { Chief Complaint:  Chief Complaint  Patient presents with   Heart Problem      History of Present Illness:   [name] is a 54 y.o. 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  HPI: [name] overall says that she is doing well; however, she reports having intermittent episodes of chest pain over the past 2 months.  She says that the pain is left-sided and radiates to her left shoulder and is sharp in quality.  It is a 6/10 in intensity and last for roughly 10 to 15 minutes when it occurs.  The pain only occurs with exertion and resolves with rest.  She denies associated diaphoresis, palpitations, swelling, PND, orthopnea, nausea, SOB or DOE.  She is taking all of her medications as prescribed without issue.  She continues to smoke but has cut it down to <1 ppd.  She is interested in quitting smoking.  No further complaints.  Interval History 04/01/24: - Patient is doing well overall and has no complaints.  Denies chest pain, SOB, PND, palpitations, orthopnea, swelling. - She continues to smoke but is improving now down to 4 cigarettes daily.  She feels like the bupropion  has helped. - She is taking all her medications as prescribed without side effect.   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 [name] is a 54 y.o. female   [id number] LOCATION:  FACILITY: [facility] PHYSICIAN: Dorn Lesches, M.D. Jul 18, 1970   DATE OF PROCEDURE:  04/15/2019  DATE OF DISCHARGE:     CARDIAC CATHETERIZATION    History obtained from chart review.54 y.o. female with a hx of renovascular HTN s/p L RA stent, NSTEMI 2017 w/ 65% LAD rx medically, HTN, breast CA, lymphoma of neck, GERD, Bell's palsy, COPD/ILD, who is being seen today for the evaluation of new CM w/ EF 25% and elevated troponin at the request of Dr Kassie.Prior exposure to cardiotoxic chemotherapy during Lymphoma and Breast cancer therapy.  Previous nuclear study demonstrated apical infarction 2017. She was admitted with heart failure. Her enzymes were low and flat. 2D echo showed a decline in EF from the 40 to  45% range to the 25 to 30% range. Because of this she was referred for right left heart cath to define her anatomy and physiology.  Impression [name] has essentially normal coronary arteries suggesting that she has a nonischemic cardiomyopathy. Her LVEDP is low suggesting that she has been adequately diuresed. Selective angiography of her left renal artery stent shows this to be widely patent. She will need more aggressive pharmacologic therapy of her hypertension as well as her left ventricular dysfunction with guideline directed optimal medical therapy including but not  limited to Entresto , carvedilol (or other beta-blocker), spironolactone . If her EF does not improve in 3 months she may require consideration for ICD implantation for primary prevention. MYNX closure devices were used to obtain hemostasis in both the artery and vein. The patient left lab in stable condition. These results have been communicated to the attending cardiologist, Dr. Claudene.  Dorn Lesches. MD, [facility] 04/15/2019 8:49 AM  Findings Coronary Findings Diagnostic  Dominance: Right  No diagnostic findings have been documented. Intervention  No interventions have been documented.   CARDIAC CATHETERIZATION  CARDIAC CATHETERIZATION 09/18/2015  Conclusion Images from the original result were not included. 1. Mid LAD to Dist LAD lesion, 65% stenosed. This is in a tortuous segment with several hinge points. Focal areas may be more significant, however the remainder the area looks relatively okay. 2. Mid LAD lesion, 45% stenosed. 3. Ost 1st Diag to 1st Diag lesion, 50% stenosed. 4. Known mild to moderately reduced EF by Echo today.  The patient had mild troponin elevation in the setting of hypertensive emergency/urgency and chest pain. Echo shows possible EF of roughly 45%. Final result pending.  I reviewed the images with Dr. Burnard, patient does have disease in the LAD, however this is a tortuous segment and there appears to be some spiraling of the vessel at this point. This would probably require relatively significant stent placement in the mid LAD. We discussed potential options, and Dr. Burnard felt it best to try to opt for medical management first.  Plan:  Return to nursing unit for continued medical management.  I will restart heparin  8 hours with sheath removal based on non-STEMI.  Continue to treat blood pressure and titrate antianginals.  Cardiovascular risk factor modification with statin and glycemic control.  If she fails medical management, would consider PCI of the  distal targeting the LAD.    [name], M.D., M.S. Interventional Cardiologist  Pager # [phone] Phone # [phone] [address] [place], [place] [address]  Findings Coronary Findings Diagnostic  Dominance: Right  Left Main . Vessel is large.  Left Anterior Descending Discrete tubular. Diffuse.  Has a normal spiraling appearance due to tortuosity of the vessel. Some images appear to have a focal area of maybe 70%, however others looked less significant.  First Diagonal Branch The vessel is moderate in size and is angiographically normal. Besides ostial lesion Discrete located at the major branch.  First Septal Branch The vessel is moderate in size. Probably difficult PCI target and would require extensive stent placement.  Second Diagonal Branch The vessel is small in size.  Second Septal Branch The vessel is small in size.  Third Septal Branch The vessel is small in size.  Ramus Intermedius . Vessel is moderate in size.  Lateral Ramus Intermedius The vessel is small in size.  Left Circumflex . Vessel is moderate in size.  First Obtuse Marginal Branch The vessel is large in size and is angiographically normal. The vessel  is tortuous.  Lateral First Obtuse Marginal Branch The vessel is small in size.  Second Obtuse Marginal Branch The vessel is small in size.  Right Coronary Artery  Right Posterior Descending Artery The vessel is moderate in size.  Inferior Septal The vessel is small in size.  Right Posterior Atrioventricular Artery The vessel is moderate in size.  Intervention  No interventions have been documented.   STRESS TESTS  MYOCARDIAL PERFUSION IMAGING 12/30/2016  Interpretation Summary  Nuclear stress EF: 43%.  There was no ST segment deviation noted during stress.  Defect 1: There is a medium defect of severe severity present in the apical anterior, apical inferior and apex location.  This is an  intermediate risk study.  The left ventricular ejection fraction is moderately decreased (30-44%).  Intermediate risk stress nuclear study with prior apical infarct and very mild peri-infarct ischemia; EF 43 with apical akinesis; study intermediate risk due to reduced LV function; note increased uptake right breast concerning for breast mass.   ECHOCARDIOGRAM  ECHOCARDIOGRAM COMPLETE 10/28/2022  Narrative ECHOCARDIOGRAM REPORT    Patient Name:   [name] Date of Exam: 10/28/2022 Medical Rec #:  [id number]           Height:       63.0 in Accession #:    [id number]          Weight:       179.4 lb Date of Birth:  [birth year]           BSA:          1.846 m Patient Age:    51 years            BP:           133/93 mmHg Patient Gender: F                   HR:           79 bpm. Exam Location:  [address]  Procedure: 2D Echo, 3D Echo, Cardiac Doppler, Color Doppler and Strain Analysis  Indications:    I42.8 Nonischemic cardiomyopathy  History:        Patient has prior history of Echocardiogram examinations, most recent 01/03/2021. Nonischemic cardiomyopathy, CAD and Previous Myocardial Infarction, Breast cancer, Status post right mastectomy with reconstruction. status post renal stent, Signs/Symptoms:1/6 systolic murmur; Risk Factors:Current Smoker. Status post revealed LVEF 55%, 3D EF 48% and PAP 33.9 mmHg.  Sonographer:    Nolon Berg BA, RDCS Referring Phys: [phone] THOMAS A KELLY  IMPRESSIONS   1. Left ventricular ejection fraction, by estimation, is 50 to 55%. Left ventricular ejection fraction by 3D volume is 53 %. The left ventricle has low normal function. The left ventricle has no regional wall motion abnormalities. Left ventricular diastolic parameters are consistent with Grade I diastolic dysfunction (impaired relaxation). The average left ventricular global longitudinal strain is -18.4 %. The global longitudinal strain is abnormal. 2. Right ventricular systolic  function is low normal. The right ventricular size is normal. There is normal pulmonary artery systolic pressure. The estimated right ventricular systolic pressure is 24.5 mmHg. 3. The mitral valve is abnormal. Mild mitral valve regurgitation. 4. The aortic valve is tricuspid. Aortic valve regurgitation is not visualized. 5. The inferior vena cava is normal in size with greater than 50% respiratory variability, suggesting right atrial pressure of 3 mmHg.  Comparison(s): Changes from prior study are noted. 01/03/2021: LVEF 45-50%.  FINDINGS Left Ventricle: Left ventricular ejection fraction, by estimation, is  50 to 55%. Left ventricular ejection fraction by 3D volume is 53 %. The left ventricle has low normal function. The left ventricle has no regional wall motion abnormalities. The average left ventricular global longitudinal strain is -18.4 %. The global longitudinal strain is abnormal. The left ventricular internal cavity size was normal in size. There is no left ventricular hypertrophy. Left ventricular diastolic parameters are consistent with Grade I diastolic dysfunction (impaired relaxation). Indeterminate filling pressures.  Right Ventricle: The right ventricular size is normal. No increase in right ventricular wall thickness. Right ventricular systolic function is low normal. There is normal pulmonary artery systolic pressure. The tricuspid regurgitant velocity is 2.32 m/s, and with an assumed right atrial pressure of 3 mmHg, the estimated right ventricular systolic pressure is 24.5 mmHg.  Left Atrium: Left atrial size was normal in size.  Right Atrium: Right atrial size was normal in size.  Pericardium: There is no evidence of pericardial effusion.  Mitral Valve: The mitral valve is abnormal. There is mild calcification of the anterior and posterior mitral valve leaflet(s). Mild mitral valve regurgitation, with centrally-directed jet.  Tricuspid Valve: The tricuspid valve is grossly  normal. Tricuspid valve regurgitation is trivial.  Aortic Valve: The aortic valve is tricuspid. Aortic valve regurgitation is not visualized.  Pulmonic Valve: The pulmonic valve was normal in structure. Pulmonic valve regurgitation is not visualized.  Aorta: The aortic root and ascending aorta are structurally normal, with no evidence of dilitation.  Venous: The inferior vena cava is normal in size with greater than 50% respiratory variability, suggesting right atrial pressure of 3 mmHg.  IAS/Shunts: No atrial level shunt detected by color flow Doppler.   LEFT VENTRICLE PLAX 2D LVIDd:         4.60 cm         Diastology LVIDs:         2.80 cm         LV e' medial:    5.65 cm/s LV PW:         0.90 cm         LV E/e' medial:  12.9 LV IVS:        0.90 cm         LV e' lateral:   8.05 cm/s LVOT diam:     2.00 cm         LV E/e' lateral: 9.0 LV SV:         54 LV SV Index:   29              2D LVOT Area:     3.14 cm        Longitudinal Strain 2D Strain GLS  -17.3 % (A2C): 2D Strain GLS  -19.7 % (A3C): 2D Strain GLS  -18.1 % (A4C): 2D Strain GLS  -18.4 % Avg:  3D Volume EF LV 3D EF:    Left ventricul ar ejection fraction by 3D volume is 53 %.  3D Volume EF: 3D EF:        53 % LV EDV:       113 ml LV ESV:       54 ml LV SV:        60 ml  RIGHT VENTRICLE             IVC RV Basal diam:  2.90 cm     IVC diam: 1.20 cm RV Mid diam:    2.30 cm RV S prime:     10.10 cm/s TAPSE (M-mode):  1.9 cm RVSP:           24.5 mmHg  LEFT ATRIUM             Index        RIGHT ATRIUM           Index LA diam:        3.90 cm 2.11 cm/m   RA Pressure: 3.00 mmHg LA Vol (A2C):   30.0 ml 16.25 ml/m  RA Area:     12.90 cm LA Vol (A4C):   27.1 ml 14.68 ml/m  RA Volume:   31.20 ml  16.90 ml/m LA Biplane Vol: 28.7 ml 15.54 ml/m AORTIC VALVE LVOT Vmax:   101.00 cm/s LVOT Vmean:  63.400 cm/s LVOT VTI:    0.172 m  AORTA Ao Root diam: 2.80 cm Ao Asc diam:  2.90 cm  MITRAL VALVE                TRICUSPID VALVE MV Area (PHT): 3.23 cm    TR Peak grad:   21.5 mmHg MV Decel Time: 235 msec    TR Vmax:        232.00 cm/s MV E velocity: 72.80 cm/s  Estimated RAP:  3.00 mmHg MV A velocity: 96.90 cm/s  RVSP:           24.5 mmHg MV E/A ratio:  0.75 SHUNTS Systemic VTI:  0.17 m Systemic Diam: 2.00 cm  Kimberly Maxcy MD Electronically signed by Kimberly Maxcy MD Signature Date/Time: 10/28/2022/12:10:46 PM    Final      CT SCANS  CT CORONARY MORPH W/CTA COR W/SCORE 12/11/2023  Addendum 12/21/2023  2:47 PM ADDENDUM REPORT: 12/21/2023 14:45  EXAM: OVER-READ INTERPRETATION  CT CHEST  The following report is an over-read performed by radiologist Dr. Andrea Bates of [facility] on 12/21/2023. This over-read does not include interpretation of cardiac or coronary anatomy or pathology. The coronary CTA interpretation by the cardiologist is attached.  COMPARISON:  Chest CT 10/07/2017  FINDINGS: Vascular: No aortic atherosclerosis. The included aorta is normal in caliber. Catheter tip is in the SVC.  Mediastinum/nodes: No adenopathy or mass.  Small hiatal hernia.  Lungs: Emphysema with moderate bronchial thickening. Areas of mucoid impaction in the right greater than left lower lobes. Subpleural scarring in the right lower lobe. No focal airspace disease. No pulmonary nodule. No pleural fluid.  Upper abdomen: No acute or unexpected findings.  Musculoskeletal: There are no acute or suspicious osseous abnormalities. Right mastectomy.  IMPRESSION: 1. Emphysema with moderate bronchial thickening. Areas of mucoid impaction in the right greater than left lower lobes. 2. Small hiatal hernia.  Emphysema (ICD10-J43.9).   Electronically Signed By: [name] M.D. On: 12/21/2023 14:45  Narrative CLINICAL DATA:  Chest pain  EXAM: Cardiac CTA  MEDICATIONS: Sub lingual nitro. 4mg  and lopressor  100mg   TECHNIQUE: A non-contrast, gated CT scan was  obtained with axial slices of 2.5 mm through the heart for calcium  scoring. Calcium  scoring was performed using the Agatston method. A 120 kV prospective, gated, contrast cardiac CT scan was obtained. Gantry rotation speed was 230 msec and collimation was 0.63 mm. Two sublingual nitroglycerin  tablets (0.8 mg) were given. The 3D data set was reconstructed with motion correction for the best systolic or diastolic phase. Images were analyzed on a dedicated workstation using MPR, MIP, and VRT modes. The patient received 95 cc of contrast  FINDINGS: Non-cardiac: See separate report from [facility]. No significant findings on limited lung and soft  tissue windows.  Calcium  Score: LM and 3 vessel calcium  noted  LM 140  LAD 117  LCX 34.1  RCA 7.63  Total 299  Coronary Arteries: Right dominant with no anomalies  LM: 25-49% mixed plaque  LAD: 1-24% calcified plaque ostium/proximal. 25-49% mixed plaque mid vessel .  D1: 1-24% soft plaque in proximal and mid vessel  D2: Small vessel normal  Circumflex: Short segment with 2 OM branches 1-24% soft plaque  OM1: 25-49% calcified plaque proximally  OM2: 1-24% mixed plaque proximally  RCA: 1-24% soft plaque proximally.  PDA: Normal  PLA: 1-24% calcified plaque  IMPRESSION: 1. LM and 3 vessel calcium  noted with score 299, which is 99 th percentile for age/sex  2.  Normal ascending thoracic aorta 3.0 cm  3. CAD RADS 2 non obstructive CAD see description above. Sent for FFR CT given high calcium  score and soft plaque  [name]  Electronically Signed: By: [name] M.D. On: 12/11/2023 15:57     ______________________________________________________________________________________________      Risk Assessment/Calculations:              Physical Exam:     VS:  BP 128/72   Pulse 70   Ht 5' 3 (1.6 m)   Wt 163 lb 9.6 oz (74.2 kg)   BMI 28.98 kg/m      Wt Readings from Last 3 Encounters:   12/24/23 165 lb 3.2 oz (74.9 kg)  12/04/23 163 lb (73.9 kg)  05/05/23 166 lb (75.3 kg)     GEN: Well nourished, well developed, in no acute distress NECK: No JVD; No carotid bruits CARDIAC: RRR, no murmurs, rubs, gallops RESPIRATORY: CTAB, no wheezes, rales, rhonchi ABDOMEN: Soft, non-tender, non-distended, normal bowel sounds EXTREMITIES:  Warm and well perfused, no edema; No deformity, 2+ radial pulses PSYCH: Normal mood and affect   Assessment & Plan  #CAD c/b NSTEMI - Currently asymptomatic and doing well.  Will continue aggressive secondary management. Continue aspirin  81 mg daily Continue statin Continue Jardiance  10 mg daily Follow-up in 12 months  #HFimpEF - Most recent LVEF was in the low normal range. - She is well compensated without symptoms.  No changes. Continue Jardiance  10 mg daily Continue medium dose Entresto  Continue spironolactone  25 mg daily Continue metoprolol  succinate 50 mg daily Continue Lasix  40 mg daily  #HTN #Renal Artery Stenosis s/p L Renal Stent - Blood pressure is at goal.  No changes.  #HLD - Last lipid panel showed an LDL above goal of less than 70. - Check cholesterol today. Lipid panel Continue atorvastatin  80 mg daily Continue Zetia  10 mg daily  #Tobacco Use Disorder - She has been doing well on the bupropion  and feels like it is curved her appetite for cigarettes.  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 -She has never tried nicotine  patches so we will try to do a to see if that is enough for her to kick the habit entirely. Continue bupropion  150 mg daily Start nicotine  patches       This note was written with the assistance of a dictation microphone or AI dictation software. Please excuse any typos or grammatical errors.   Signed, Georganna Archer, MD 03/31/2024 10:01 PM    [facility]  "

## 2024-04-01 ENCOUNTER — Ambulatory Visit
Attending: Student in an Organized Health Care Education/Training Program | Admitting: Student in an Organized Health Care Education/Training Program

## 2024-04-01 VITALS — BP 128/72 | HR 70 | Ht 63.0 in | Wt 163.6 lb

## 2024-04-01 DIAGNOSIS — E785 Hyperlipidemia, unspecified: Secondary | ICD-10-CM | POA: Insufficient documentation

## 2024-04-01 DIAGNOSIS — Z79899 Other long term (current) drug therapy: Secondary | ICD-10-CM | POA: Diagnosis present

## 2024-04-01 LAB — LIPID PANEL
Chol/HDL Ratio: 2.7 ratio (ref 0.0–4.4)
Cholesterol, Total: 109 mg/dL (ref 100–199)
HDL: 41 mg/dL
LDL Chol Calc (NIH): 49 mg/dL (ref 0–99)
Triglycerides: 99 mg/dL (ref 0–149)
VLDL Cholesterol Cal: 19 mg/dL (ref 5–40)

## 2024-04-01 LAB — COMPREHENSIVE METABOLIC PANEL WITH GFR
ALT: 21 IU/L (ref 0–32)
AST: 27 IU/L (ref 0–40)
Albumin: 4.6 g/dL (ref 3.8–4.9)
Alkaline Phosphatase: 223 IU/L — ABNORMAL HIGH (ref 49–135)
BUN/Creatinine Ratio: 8 — ABNORMAL LOW (ref 9–23)
BUN: 8 mg/dL (ref 6–24)
Bilirubin Total: 0.7 mg/dL (ref 0.0–1.2)
CO2: 23 mmol/L (ref 20–29)
Calcium: 11.4 mg/dL — ABNORMAL HIGH (ref 8.7–10.2)
Chloride: 105 mmol/L (ref 96–106)
Creatinine, Ser: 0.98 mg/dL (ref 0.57–1.00)
Globulin, Total: 2.1 g/dL (ref 1.5–4.5)
Glucose: 82 mg/dL (ref 70–99)
Potassium: 4.2 mmol/L (ref 3.5–5.2)
Sodium: 143 mmol/L (ref 134–144)
Total Protein: 6.7 g/dL (ref 6.0–8.5)
eGFR: 69 mL/min/1.73

## 2024-04-01 LAB — MAGNESIUM: Magnesium: 2.2 mg/dL (ref 1.6–2.3)

## 2024-04-01 MED ORDER — ATORVASTATIN CALCIUM 80 MG PO TABS: 80.0000 mg | ORAL_TABLET | Freq: Every day | ORAL | 3 refills | Status: AC

## 2024-04-01 MED ORDER — SPIRONOLACTONE 25 MG PO TABS: 25.0000 mg | ORAL_TABLET | Freq: Every day | ORAL | 3 refills | Status: AC

## 2024-04-01 MED ORDER — NICOTINE 7 MG/24HR TD PT24: 7.0000 mg | MEDICATED_PATCH | Freq: Every day | TRANSDERMAL | 3 refills | Status: AC

## 2024-04-01 MED ORDER — SACUBITRIL-VALSARTAN 49-51 MG PO TABS: 1.0000 | ORAL_TABLET | Freq: Every day | ORAL | 3 refills | Status: AC

## 2024-04-01 NOTE — Patient Instructions (Signed)
 Medication Instructions:   NICODERM PATCH ONCE DAILY  *If you need a refill on your cardiac medications before your next appointment, please call your pharmacy*  Follow-Up: At [facility], you and your health needs are our priority.  As part of our continuing mission to provide you with exceptional heart care, our providers are all part of one team.  This team includes your primary Cardiologist (physician) and Advanced Practice Providers or APPs (Physician Assistants and Nurse Practitioners) who all work together to provide you with the care you need, when you need it.  Your next appointment:   6 month(s)  Provider:   Georganna Archer, MD

## 2024-04-02 ENCOUNTER — Telehealth: Payer: Self-pay | Admitting: Pharmacy Technician

## 2024-04-02 ENCOUNTER — Ambulatory Visit: Payer: Self-pay | Admitting: Student in an Organized Health Care Education/Training Program

## 2024-04-02 ENCOUNTER — Other Ambulatory Visit (HOSPITAL_COMMUNITY): Payer: Self-pay

## 2024-04-02 NOTE — Telephone Encounter (Signed)
 Pharmacy Patient Advocate Encounter   Received notification from CoverMyMeds that prior authorization for entresto  BRAND is required/requested.   Insurance verification completed.   The patient is insured through [employer].   Per test claim: The current 04/02/24 day co-pay is, $4.00- 3 months.  No PA needed at this time. This test claim was processed through [facility]- copay amounts may vary at other pharmacies due to pharmacy/plan contracts, or as the patient moves through the different stages of their insurance plan.     BRAND name only
